# Patient Record
Sex: Female | Born: 1940
Health system: Southern US, Community
[De-identification: ages and names within clinical notes are randomized; demographics above are authoritative.]

## PROBLEM LIST (undated history)

## (undated) ENCOUNTER — Emergency Department (HOSPITAL_COMMUNITY): Admission: EM | Payer: Medicare Other | Source: Home / Self Care

## (undated) DIAGNOSIS — Z87898 Personal history of other specified conditions: Secondary | ICD-10-CM

## (undated) DIAGNOSIS — K2270 Barrett's esophagus without dysplasia: Secondary | ICD-10-CM

## (undated) DIAGNOSIS — G459 Transient cerebral ischemic attack, unspecified: Secondary | ICD-10-CM

## (undated) DIAGNOSIS — J019 Acute sinusitis, unspecified: Secondary | ICD-10-CM

## (undated) DIAGNOSIS — I739 Peripheral vascular disease, unspecified: Secondary | ICD-10-CM

## (undated) DIAGNOSIS — M79609 Pain in unspecified limb: Secondary | ICD-10-CM

## (undated) DIAGNOSIS — E041 Nontoxic single thyroid nodule: Principal | ICD-10-CM

## (undated) DIAGNOSIS — I639 Cerebral infarction, unspecified: Secondary | ICD-10-CM

## (undated) DIAGNOSIS — M5137 Other intervertebral disc degeneration, lumbosacral region: Secondary | ICD-10-CM

## (undated) DIAGNOSIS — E785 Hyperlipidemia, unspecified: Secondary | ICD-10-CM

## (undated) DIAGNOSIS — F411 Generalized anxiety disorder: Secondary | ICD-10-CM

## (undated) DIAGNOSIS — Z0000 Encounter for general adult medical examination without abnormal findings: Secondary | ICD-10-CM

## (undated) DIAGNOSIS — N35919 Unspecified urethral stricture, male, unspecified site: Secondary | ICD-10-CM

## (undated) DIAGNOSIS — M25539 Pain in unspecified wrist: Secondary | ICD-10-CM

## (undated) DIAGNOSIS — K5909 Other constipation: Secondary | ICD-10-CM

## (undated) DIAGNOSIS — K219 Gastro-esophageal reflux disease without esophagitis: Secondary | ICD-10-CM

## (undated) DIAGNOSIS — G609 Hereditary and idiopathic neuropathy, unspecified: Secondary | ICD-10-CM

## (undated) DIAGNOSIS — Z8679 Personal history of other diseases of the circulatory system: Secondary | ICD-10-CM

## (undated) DIAGNOSIS — F329 Major depressive disorder, single episode, unspecified: Secondary | ICD-10-CM

## (undated) DIAGNOSIS — M5030 Other cervical disc degeneration, unspecified cervical region: Secondary | ICD-10-CM

## (undated) DIAGNOSIS — I6529 Occlusion and stenosis of unspecified carotid artery: Secondary | ICD-10-CM

## (undated) DIAGNOSIS — Z86010 Personal history of colonic polyps: Secondary | ICD-10-CM

## (undated) DIAGNOSIS — M545 Low back pain: Secondary | ICD-10-CM

## (undated) DIAGNOSIS — H6980 Other specified disorders of Eustachian tube, unspecified ear: Secondary | ICD-10-CM

## (undated) DIAGNOSIS — R7302 Impaired glucose tolerance (oral): Secondary | ICD-10-CM

## (undated) HISTORY — DX: Impaired glucose tolerance (oral): R73.02

## (undated) HISTORY — DX: Other intervertebral disc degeneration, lumbosacral region: M51.37

## (undated) HISTORY — DX: Pain in unspecified limb: M79.609

## (undated) HISTORY — DX: Barrett's esophagus without dysplasia: K22.70

## (undated) HISTORY — DX: Pain in unspecified wrist: M25.539

## (undated) HISTORY — DX: Generalized anxiety disorder: F41.1

## (undated) HISTORY — DX: Acute sinusitis, unspecified: J01.90

## (undated) HISTORY — DX: Encounter for general adult medical examination without abnormal findings: Z00.00

## (undated) HISTORY — DX: Other cervical disc degeneration, unspecified cervical region: M50.30

## (undated) HISTORY — DX: Gastro-esophageal reflux disease without esophagitis: K21.9

## (undated) HISTORY — PX: OTHER SURGICAL HISTORY: SHX169

## (undated) HISTORY — DX: Personal history of colonic polyps: Z86.010

## (undated) HISTORY — DX: Cerebral infarction, unspecified: I63.9

## (undated) HISTORY — DX: Hyperlipidemia, unspecified: E78.5

## (undated) HISTORY — DX: Nontoxic single thyroid nodule: E04.1

## (undated) HISTORY — DX: Personal history of other specified conditions: Z87.898

## (undated) HISTORY — DX: Unspecified urethral stricture, male, unspecified site: N35.919

## (undated) HISTORY — DX: Hereditary and idiopathic neuropathy, unspecified: G60.9

## (undated) HISTORY — PX: TUBAL LIGATION: SHX77

## (undated) HISTORY — DX: Occlusion and stenosis of unspecified carotid artery: I65.29

## (undated) HISTORY — DX: Other specified disorders of Eustachian tube, unspecified ear: H69.80

## (undated) HISTORY — DX: Other constipation: K59.09

## (undated) HISTORY — DX: Transient cerebral ischemic attack, unspecified: G45.9

## (undated) HISTORY — DX: Peripheral vascular disease, unspecified: I73.9

## (undated) HISTORY — DX: Major depressive disorder, single episode, unspecified: F32.9

## (undated) HISTORY — DX: Personal history of other diseases of the circulatory system: Z86.79

## (undated) HISTORY — DX: Low back pain: M54.5

---

## 1982-07-30 HISTORY — PX: OTHER SURGICAL HISTORY: SHX169

## 2000-05-18 ENCOUNTER — Emergency Department (HOSPITAL_COMMUNITY): Admission: EM | Admit: 2000-05-18 | Discharge: 2000-05-18 | Payer: Self-pay | Admitting: Emergency Medicine

## 2000-07-12 ENCOUNTER — Encounter: Admission: RE | Admit: 2000-07-12 | Discharge: 2000-07-12 | Payer: Self-pay | Admitting: Family Medicine

## 2000-07-12 ENCOUNTER — Encounter: Payer: Self-pay | Admitting: Family Medicine

## 2000-08-22 ENCOUNTER — Ambulatory Visit (HOSPITAL_COMMUNITY): Admission: RE | Admit: 2000-08-22 | Discharge: 2000-08-22 | Payer: Self-pay | Admitting: Urology

## 2000-08-22 ENCOUNTER — Encounter (INDEPENDENT_AMBULATORY_CARE_PROVIDER_SITE_OTHER): Payer: Self-pay | Admitting: Specialist

## 2000-12-11 ENCOUNTER — Encounter: Admission: RE | Admit: 2000-12-11 | Discharge: 2000-12-11 | Payer: Self-pay | Admitting: Family Medicine

## 2000-12-11 ENCOUNTER — Encounter: Payer: Self-pay | Admitting: Family Medicine

## 2001-04-04 ENCOUNTER — Other Ambulatory Visit: Admission: RE | Admit: 2001-04-04 | Discharge: 2001-04-04 | Payer: Self-pay | Admitting: Family Medicine

## 2001-05-11 ENCOUNTER — Emergency Department (HOSPITAL_COMMUNITY): Admission: EM | Admit: 2001-05-11 | Discharge: 2001-05-11 | Payer: Self-pay | Admitting: Emergency Medicine

## 2001-05-11 ENCOUNTER — Encounter: Payer: Self-pay | Admitting: Emergency Medicine

## 2001-07-15 ENCOUNTER — Encounter: Admission: RE | Admit: 2001-07-15 | Discharge: 2001-07-15 | Payer: Self-pay | Admitting: Family Medicine

## 2001-07-15 ENCOUNTER — Encounter: Payer: Self-pay | Admitting: Family Medicine

## 2001-10-07 ENCOUNTER — Ambulatory Visit (HOSPITAL_COMMUNITY): Admission: RE | Admit: 2001-10-07 | Discharge: 2001-10-07 | Payer: Self-pay | Admitting: Gastroenterology

## 2001-11-29 ENCOUNTER — Emergency Department (HOSPITAL_COMMUNITY): Admission: EM | Admit: 2001-11-29 | Discharge: 2001-11-30 | Payer: Self-pay | Admitting: Emergency Medicine

## 2001-11-30 ENCOUNTER — Encounter: Payer: Self-pay | Admitting: Emergency Medicine

## 2002-07-20 ENCOUNTER — Encounter: Admission: RE | Admit: 2002-07-20 | Discharge: 2002-07-20 | Payer: Self-pay | Admitting: Family Medicine

## 2002-07-20 ENCOUNTER — Encounter: Payer: Self-pay | Admitting: Family Medicine

## 2003-04-27 ENCOUNTER — Encounter: Payer: Self-pay | Admitting: Emergency Medicine

## 2003-04-28 ENCOUNTER — Observation Stay (HOSPITAL_COMMUNITY): Admission: EM | Admit: 2003-04-28 | Discharge: 2003-04-29 | Payer: Self-pay | Admitting: Emergency Medicine

## 2003-07-22 ENCOUNTER — Encounter: Admission: RE | Admit: 2003-07-22 | Discharge: 2003-07-22 | Payer: Self-pay | Admitting: Family Medicine

## 2003-11-16 ENCOUNTER — Encounter: Admission: RE | Admit: 2003-11-16 | Discharge: 2003-11-16 | Payer: Self-pay | Admitting: Family Medicine

## 2004-02-21 ENCOUNTER — Encounter: Admission: RE | Admit: 2004-02-21 | Discharge: 2004-02-21 | Payer: Self-pay | Admitting: Otolaryngology

## 2004-06-08 ENCOUNTER — Encounter (INDEPENDENT_AMBULATORY_CARE_PROVIDER_SITE_OTHER): Payer: Self-pay | Admitting: Specialist

## 2004-06-08 ENCOUNTER — Ambulatory Visit (HOSPITAL_COMMUNITY): Admission: RE | Admit: 2004-06-08 | Discharge: 2004-06-08 | Payer: Self-pay | Admitting: Gastroenterology

## 2004-07-17 ENCOUNTER — Other Ambulatory Visit: Admission: RE | Admit: 2004-07-17 | Discharge: 2004-07-17 | Payer: Self-pay | Admitting: Obstetrics and Gynecology

## 2004-08-26 ENCOUNTER — Emergency Department (HOSPITAL_COMMUNITY): Admission: EM | Admit: 2004-08-26 | Discharge: 2004-08-26 | Payer: Self-pay | Admitting: Emergency Medicine

## 2004-09-28 ENCOUNTER — Encounter: Admission: RE | Admit: 2004-09-28 | Discharge: 2004-09-28 | Payer: Self-pay | Admitting: Family Medicine

## 2004-12-06 ENCOUNTER — Ambulatory Visit (HOSPITAL_COMMUNITY): Admission: RE | Admit: 2004-12-06 | Discharge: 2004-12-06 | Payer: Self-pay | Admitting: Orthopedic Surgery

## 2005-01-05 ENCOUNTER — Emergency Department (HOSPITAL_COMMUNITY): Admission: EM | Admit: 2005-01-05 | Discharge: 2005-01-06 | Payer: Self-pay | Admitting: Emergency Medicine

## 2005-03-26 ENCOUNTER — Emergency Department (HOSPITAL_COMMUNITY): Admission: EM | Admit: 2005-03-26 | Discharge: 2005-03-26 | Payer: Self-pay | Admitting: Family Medicine

## 2005-05-11 ENCOUNTER — Encounter: Payer: Self-pay | Admitting: Internal Medicine

## 2005-05-11 ENCOUNTER — Encounter: Admission: RE | Admit: 2005-05-11 | Discharge: 2005-05-11 | Payer: Self-pay | Admitting: Internal Medicine

## 2005-08-21 ENCOUNTER — Encounter: Admission: RE | Admit: 2005-08-21 | Discharge: 2005-08-21 | Payer: Self-pay | Admitting: Neurology

## 2005-10-12 ENCOUNTER — Encounter: Admission: RE | Admit: 2005-10-12 | Discharge: 2005-10-12 | Payer: Self-pay | Admitting: Family Medicine

## 2005-11-16 ENCOUNTER — Encounter: Payer: Self-pay | Admitting: Internal Medicine

## 2006-05-29 ENCOUNTER — Emergency Department (HOSPITAL_COMMUNITY): Admission: EM | Admit: 2006-05-29 | Discharge: 2006-05-29 | Payer: Self-pay | Admitting: *Deleted

## 2006-06-03 ENCOUNTER — Observation Stay (HOSPITAL_COMMUNITY): Admission: EM | Admit: 2006-06-03 | Discharge: 2006-06-03 | Payer: Self-pay | Admitting: Family Medicine

## 2006-06-11 ENCOUNTER — Ambulatory Visit (HOSPITAL_COMMUNITY): Admission: RE | Admit: 2006-06-11 | Discharge: 2006-06-11 | Payer: Self-pay | Admitting: Cardiology

## 2006-10-15 ENCOUNTER — Encounter: Admission: RE | Admit: 2006-10-15 | Discharge: 2006-10-15 | Payer: Self-pay | Admitting: Family Medicine

## 2007-01-24 ENCOUNTER — Ambulatory Visit: Payer: Self-pay | Admitting: *Deleted

## 2007-01-24 ENCOUNTER — Emergency Department (HOSPITAL_COMMUNITY): Admission: EM | Admit: 2007-01-24 | Discharge: 2007-01-24 | Payer: Self-pay | Admitting: Emergency Medicine

## 2007-03-04 ENCOUNTER — Ambulatory Visit: Payer: Self-pay | Admitting: Vascular Surgery

## 2007-03-04 ENCOUNTER — Inpatient Hospital Stay (HOSPITAL_COMMUNITY): Admission: EM | Admit: 2007-03-04 | Discharge: 2007-03-07 | Payer: Self-pay | Admitting: Emergency Medicine

## 2007-03-05 ENCOUNTER — Encounter (INDEPENDENT_AMBULATORY_CARE_PROVIDER_SITE_OTHER): Payer: Self-pay | Admitting: Internal Medicine

## 2007-06-30 ENCOUNTER — Encounter: Payer: Self-pay | Admitting: Internal Medicine

## 2007-06-30 LAB — CONVERTED CEMR LAB: Pap Smear: NORMAL

## 2007-07-04 ENCOUNTER — Other Ambulatory Visit: Admission: RE | Admit: 2007-07-04 | Discharge: 2007-07-04 | Payer: Self-pay | Admitting: Family Medicine

## 2007-07-08 ENCOUNTER — Encounter: Payer: Self-pay | Admitting: Internal Medicine

## 2007-08-05 ENCOUNTER — Emergency Department (HOSPITAL_COMMUNITY): Admission: EM | Admit: 2007-08-05 | Discharge: 2007-08-06 | Payer: Self-pay | Admitting: Emergency Medicine

## 2007-08-29 ENCOUNTER — Encounter: Payer: Self-pay | Admitting: Internal Medicine

## 2007-10-16 ENCOUNTER — Encounter: Admission: RE | Admit: 2007-10-16 | Discharge: 2007-10-16 | Payer: Self-pay | Admitting: Family Medicine

## 2008-02-13 ENCOUNTER — Ambulatory Visit (HOSPITAL_COMMUNITY): Admission: RE | Admit: 2008-02-13 | Discharge: 2008-02-13 | Payer: Self-pay | Admitting: Neurology

## 2008-07-08 ENCOUNTER — Encounter: Payer: Self-pay | Admitting: Internal Medicine

## 2008-10-26 ENCOUNTER — Encounter: Payer: Self-pay | Admitting: Internal Medicine

## 2009-01-06 ENCOUNTER — Encounter: Payer: Self-pay | Admitting: Internal Medicine

## 2009-01-27 ENCOUNTER — Encounter: Admission: RE | Admit: 2009-01-27 | Discharge: 2009-01-27 | Payer: Self-pay | Admitting: Family Medicine

## 2009-04-08 ENCOUNTER — Encounter: Payer: Self-pay | Admitting: Internal Medicine

## 2009-07-04 ENCOUNTER — Encounter: Payer: Self-pay | Admitting: Internal Medicine

## 2009-08-29 ENCOUNTER — Ambulatory Visit: Payer: Self-pay | Admitting: Internal Medicine

## 2009-08-29 ENCOUNTER — Telehealth (INDEPENDENT_AMBULATORY_CARE_PROVIDER_SITE_OTHER): Payer: Self-pay | Admitting: *Deleted

## 2009-08-29 DIAGNOSIS — E739 Lactose intolerance, unspecified: Secondary | ICD-10-CM | POA: Insufficient documentation

## 2009-08-29 DIAGNOSIS — M51379 Other intervertebral disc degeneration, lumbosacral region without mention of lumbar back pain or lower extremity pain: Secondary | ICD-10-CM

## 2009-08-29 DIAGNOSIS — M5137 Other intervertebral disc degeneration, lumbosacral region: Secondary | ICD-10-CM

## 2009-08-29 DIAGNOSIS — E785 Hyperlipidemia, unspecified: Secondary | ICD-10-CM

## 2009-08-29 DIAGNOSIS — M5030 Other cervical disc degeneration, unspecified cervical region: Secondary | ICD-10-CM

## 2009-08-29 DIAGNOSIS — I739 Peripheral vascular disease, unspecified: Secondary | ICD-10-CM

## 2009-08-29 DIAGNOSIS — Z860100 Personal history of colon polyps, unspecified: Secondary | ICD-10-CM

## 2009-08-29 DIAGNOSIS — Z8679 Personal history of other diseases of the circulatory system: Secondary | ICD-10-CM

## 2009-08-29 DIAGNOSIS — M79609 Pain in unspecified limb: Secondary | ICD-10-CM

## 2009-08-29 DIAGNOSIS — K219 Gastro-esophageal reflux disease without esophagitis: Secondary | ICD-10-CM

## 2009-08-29 HISTORY — DX: Hyperlipidemia, unspecified: E78.5

## 2009-08-29 HISTORY — DX: Other cervical disc degeneration, unspecified cervical region: M50.30

## 2009-08-29 HISTORY — DX: Personal history of colon polyps, unspecified: Z86.0100

## 2009-08-29 HISTORY — DX: Other intervertebral disc degeneration, lumbosacral region: M51.37

## 2009-08-29 HISTORY — DX: Peripheral vascular disease, unspecified: I73.9

## 2009-08-29 HISTORY — DX: Other intervertebral disc degeneration, lumbosacral region without mention of lumbar back pain or lower extremity pain: M51.379

## 2009-08-29 HISTORY — DX: Pain in unspecified limb: M79.609

## 2009-08-29 HISTORY — DX: Personal history of colonic polyps: Z86.010

## 2009-08-29 HISTORY — DX: Personal history of other diseases of the circulatory system: Z86.79

## 2009-08-29 HISTORY — DX: Gastro-esophageal reflux disease without esophagitis: K21.9

## 2009-08-30 ENCOUNTER — Telehealth (INDEPENDENT_AMBULATORY_CARE_PROVIDER_SITE_OTHER): Payer: Self-pay | Admitting: *Deleted

## 2009-08-31 ENCOUNTER — Encounter: Payer: Self-pay | Admitting: Internal Medicine

## 2009-08-31 DIAGNOSIS — I6529 Occlusion and stenosis of unspecified carotid artery: Secondary | ICD-10-CM

## 2009-08-31 DIAGNOSIS — I739 Peripheral vascular disease, unspecified: Secondary | ICD-10-CM

## 2009-08-31 HISTORY — DX: Occlusion and stenosis of unspecified carotid artery: I65.29

## 2009-09-01 ENCOUNTER — Encounter: Payer: Self-pay | Admitting: Internal Medicine

## 2009-09-01 ENCOUNTER — Ambulatory Visit: Payer: Self-pay

## 2009-09-22 ENCOUNTER — Telehealth (INDEPENDENT_AMBULATORY_CARE_PROVIDER_SITE_OTHER): Payer: Self-pay | Admitting: *Deleted

## 2009-10-03 ENCOUNTER — Encounter: Payer: Self-pay | Admitting: Internal Medicine

## 2009-10-03 DIAGNOSIS — F329 Major depressive disorder, single episode, unspecified: Secondary | ICD-10-CM

## 2009-10-03 DIAGNOSIS — K2270 Barrett's esophagus without dysplasia: Secondary | ICD-10-CM

## 2009-10-03 DIAGNOSIS — K5909 Other constipation: Secondary | ICD-10-CM

## 2009-10-03 DIAGNOSIS — N35919 Unspecified urethral stricture, male, unspecified site: Secondary | ICD-10-CM

## 2009-10-03 DIAGNOSIS — G609 Hereditary and idiopathic neuropathy, unspecified: Secondary | ICD-10-CM

## 2009-10-03 DIAGNOSIS — F3289 Other specified depressive episodes: Secondary | ICD-10-CM

## 2009-10-03 DIAGNOSIS — F411 Generalized anxiety disorder: Secondary | ICD-10-CM

## 2009-10-03 DIAGNOSIS — Z87898 Personal history of other specified conditions: Secondary | ICD-10-CM | POA: Insufficient documentation

## 2009-10-03 HISTORY — DX: Other specified depressive episodes: F32.89

## 2009-10-03 HISTORY — DX: Major depressive disorder, single episode, unspecified: F32.9

## 2009-10-03 HISTORY — DX: Personal history of other specified conditions: Z87.898

## 2009-10-03 HISTORY — DX: Generalized anxiety disorder: F41.1

## 2009-10-03 HISTORY — DX: Unspecified urethral stricture, male, unspecified site: N35.919

## 2009-10-03 HISTORY — DX: Other constipation: K59.09

## 2009-10-03 HISTORY — DX: Hereditary and idiopathic neuropathy, unspecified: G60.9

## 2009-10-03 HISTORY — DX: Barrett's esophagus without dysplasia: K22.70

## 2009-11-01 ENCOUNTER — Encounter (INDEPENDENT_AMBULATORY_CARE_PROVIDER_SITE_OTHER): Payer: Self-pay | Admitting: Internal Medicine

## 2009-11-01 ENCOUNTER — Inpatient Hospital Stay (HOSPITAL_COMMUNITY): Admission: EM | Admit: 2009-11-01 | Discharge: 2009-11-02 | Payer: Self-pay | Admitting: Emergency Medicine

## 2009-11-01 ENCOUNTER — Ambulatory Visit: Payer: Self-pay | Admitting: Vascular Surgery

## 2009-11-22 ENCOUNTER — Ambulatory Visit: Payer: Self-pay | Admitting: Internal Medicine

## 2009-11-22 DIAGNOSIS — M545 Low back pain, unspecified: Secondary | ICD-10-CM

## 2009-11-22 DIAGNOSIS — G459 Transient cerebral ischemic attack, unspecified: Secondary | ICD-10-CM | POA: Insufficient documentation

## 2009-11-22 HISTORY — DX: Low back pain, unspecified: M54.50

## 2009-11-22 HISTORY — DX: Transient cerebral ischemic attack, unspecified: G45.9

## 2009-12-05 ENCOUNTER — Telehealth: Payer: Self-pay | Admitting: Internal Medicine

## 2010-02-08 ENCOUNTER — Telehealth: Payer: Self-pay | Admitting: Internal Medicine

## 2010-03-24 ENCOUNTER — Encounter: Admission: RE | Admit: 2010-03-24 | Discharge: 2010-03-24 | Payer: Self-pay | Admitting: Internal Medicine

## 2010-03-24 LAB — HM MAMMOGRAPHY: HM Mammogram: NEGATIVE

## 2010-03-27 ENCOUNTER — Encounter: Payer: Self-pay | Admitting: Internal Medicine

## 2010-04-06 ENCOUNTER — Encounter: Admission: RE | Admit: 2010-04-06 | Discharge: 2010-04-06 | Payer: Self-pay | Admitting: Gastroenterology

## 2010-04-20 ENCOUNTER — Encounter: Payer: Self-pay | Admitting: Internal Medicine

## 2010-05-22 ENCOUNTER — Encounter: Payer: Self-pay | Admitting: Internal Medicine

## 2010-05-25 ENCOUNTER — Ambulatory Visit: Payer: Self-pay | Admitting: Internal Medicine

## 2010-05-25 DIAGNOSIS — M25539 Pain in unspecified wrist: Secondary | ICD-10-CM

## 2010-05-25 HISTORY — DX: Pain in unspecified wrist: M25.539

## 2010-05-25 LAB — CONVERTED CEMR LAB
ALT: 22 units/L (ref 0–35)
AST: 30 units/L (ref 0–37)
Alkaline Phosphatase: 63 units/L (ref 39–117)
Basophils Relative: 0.9 % (ref 0.0–3.0)
Bilirubin Urine: NEGATIVE
Bilirubin, Direct: 0.1 mg/dL (ref 0.0–0.3)
Calcium: 9.3 mg/dL (ref 8.4–10.5)
Creatinine, Ser: 0.8 mg/dL (ref 0.4–1.2)
Eosinophils Absolute: 0.1 10*3/uL (ref 0.0–0.7)
Eosinophils Relative: 3.2 % (ref 0.0–5.0)
GFR calc non Af Amer: 88.41 mL/min (ref >60)
HDL: 46.1 mg/dL (ref >39.00)
Hemoglobin: 14.9 g/dL (ref 12.0–15.0)
Hgb A1c MFr Bld: 6.3 % (ref 4.6–6.5)
Leukocytes, UA: NEGATIVE
Lymphocytes Relative: 43.4 % (ref 12.0–46.0)
Monocytes Relative: 9.2 % (ref 3.0–12.0)
Neutro Abs: 1.6 10*3/uL (ref 1.4–7.7)
Neutrophils Relative %: 43.3 % (ref 43.0–77.0)
Nitrite: NEGATIVE
RBC: 4.6 M/uL (ref 3.87–5.11)
Sodium: 136 meq/L (ref 135–145)
TSH: 0.51 microintl units/mL (ref 0.35–5.50)
Total Protein: 6.7 g/dL (ref 6.0–8.3)
WBC: 3.6 10*3/uL — ABNORMAL LOW (ref 4.5–10.5)
pH: 7.5 (ref 5.0–8.0)

## 2010-06-26 ENCOUNTER — Ambulatory Visit: Payer: Self-pay | Admitting: Internal Medicine

## 2010-06-28 ENCOUNTER — Telehealth (INDEPENDENT_AMBULATORY_CARE_PROVIDER_SITE_OTHER): Payer: Self-pay | Admitting: *Deleted

## 2010-07-03 ENCOUNTER — Telehealth (INDEPENDENT_AMBULATORY_CARE_PROVIDER_SITE_OTHER): Payer: Self-pay | Admitting: *Deleted

## 2010-07-06 ENCOUNTER — Encounter: Payer: Self-pay | Admitting: Internal Medicine

## 2010-07-19 ENCOUNTER — Telehealth (INDEPENDENT_AMBULATORY_CARE_PROVIDER_SITE_OTHER): Payer: Self-pay | Admitting: *Deleted

## 2010-08-02 ENCOUNTER — Encounter: Payer: Self-pay | Admitting: Internal Medicine

## 2010-08-08 ENCOUNTER — Ambulatory Visit
Admission: RE | Admit: 2010-08-08 | Discharge: 2010-08-08 | Payer: Self-pay | Source: Home / Self Care | Attending: Internal Medicine | Admitting: Internal Medicine

## 2010-08-10 ENCOUNTER — Ambulatory Visit
Admission: RE | Admit: 2010-08-10 | Discharge: 2010-08-10 | Payer: Self-pay | Source: Home / Self Care | Attending: Internal Medicine | Admitting: Internal Medicine

## 2010-08-21 ENCOUNTER — Encounter: Payer: Self-pay | Admitting: Neurology

## 2010-08-29 NOTE — Progress Notes (Signed)
Summary: Rx refill req  Phone Note Refill Request Message from:  Patient on February 08, 2010 3:17 PM  Refills Requested: Medication #1:  CRESTOR 20 MG TABS 1 by mouth once daily   Dosage confirmed as above?Dosage Confirmed   Supply Requested: 6 months  Medication #2:  OMEPRAZOLE 20 MG CPDR 2po once daily   Dosage confirmed as above?Dosage Confirmed   Supply Requested: 6 months  Medication #3:  PLAVIX 75 MG TABS 1po once daily.   Dosage confirmed as above?Dosage Confirmed   Supply Requested: 6 months Pt request Rx to [facility] [address]   Method Requested: Electronic Initial call taken by: Margaret Pyle, CMA,  February 08, 2010 3:17 PM    Prescriptions: PLAVIX 75 MG TABS (CLOPIDOGREL BISULFATE) 1po once daily  #90 x 1   Entered by:   Margaret Pyle, CMA   Authorized by:   Corwin Levins MD   Signed by:   Margaret Pyle, CMA on 02/08/2010   Method used:   Electronically to        [facility] [address] (retail)       [phone]       [place], [place]  [address]       Ph: [phone]       Fax: [phone]   RxID:   [id number] OMEPRAZOLE 20 MG CPDR (OMEPRAZOLE) 2po once daily  #180 x 1   Entered by:   Margaret Pyle, CMA   Authorized by:   Corwin Levins MD   Signed by:   Margaret Pyle, CMA on 02/08/2010   Method used:   Electronically to        [facility] [address] (retail)       [phone]       [place], [place]  [address]       Ph: [phone]       Fax: [phone]   RxID:   [id number] CRESTOR 20 MG TABS (ROSUVASTATIN CALCIUM) 1 by mouth once daily  #90 x 1   Entered by:   Margaret Pyle, CMA   Authorized by:   Corwin Levins MD   Signed by:   Margaret Pyle, CMA on 02/08/2010   Method used:   Electronically to        [facility] [address] (retail)       [phone]       [place], [place]   [address]       Ph: [phone]       Fax: [phone]   RxID:   [id number]

## 2010-08-29 NOTE — Letter (Signed)
Summary: Date Range:05-28-05 to 07-04-09/[facility]  Date Range:05-28-05 to 07-04-09/[facility]   Imported By: Sherian Rein 10/06/2009 07:32:44  _____________________________________________________________________  External Attachment:    Type:   Image     Comment:   External Document

## 2010-08-29 NOTE — Letter (Signed)
Summary: [facility]  [facility]   Imported By: Lester Springdale 04/25/2010 09:32:20  _____________________________________________________________________  External Attachment:    Type:   Image     Comment:   External Document

## 2010-08-29 NOTE — Letter (Signed)
Summary: [facility]   Imported By: Sherian Rein 04/05/2010 15:26:19  _____________________________________________________________________  External Attachment:    Type:   Image     Comment:   External Document

## 2010-08-29 NOTE — Progress Notes (Signed)
----   Converted from flag ---- ---- 08/29/2009 12:59 PM, Edman Circle wrote: appt 2/3 @ 4:00  ---- 08/29/2009 10:35 AM, Dagoberto Reef wrote: Thanks  ---- 08/29/2009 10:32 AM, Corwin Levins MD wrote: The following orders have been entered for this patient and placed on Admin Hold:  Type:     Referral       Code:   Misc. Ref Description:   Misc. Referral Order Date:   08/29/2009   Authorized By:   Corwin Levins MD Order #:   [id number] Clinical Notes:   Type of Referral:  carotid dopplers Reason: ------------------------------

## 2010-08-29 NOTE — Progress Notes (Signed)
Summary: Medical Release completed  Medical release completed and mailed to [facility] [address] [name] [place], [place] [address] Attn: Dr. Abigail Miyamoto.[name]  August 29, 2009 3:52 PM  Appended Document: Medical Release completed Records received from [facility] for Dr.John 161 pages. Placed on dumbwaiter. [name] made aware.

## 2010-08-29 NOTE — Assessment & Plan Note (Signed)
Summary: [facility] hospital [facility]/#/cd   Vital Signs:  Patient profile:   70 year old female Height:      66.5 inches Weight:      181 pounds BMI:     28.88 O2 Sat:      97 % on Room air Temp:     97.5 degrees F oral Pulse rate:   72 / minute BP sitting:   118 / 62  (left arm) Cuff size:   regular  Vitals Entered ByZella Ball Ewing (November 22, 2009 2:35 PM)  O2 Flow:  Room air CC: Post Hospital/RE   CC:  Post Hospital/RE.  History of Present Illness: overall doing well, post hospn for TIA, fortunately resolved without residual or recurrence of neuro symtpoms - Pt denies new neuro symptoms such as headache, facial or extremity weakness .   Good compliance with meds, good tolerability, including lipids med.  Does find her current PPI very expensive and wishes to change to less expensive.  Pt denies CP, sob, doe, wheezing, orthopnea, pnd, worsening LE edema, palps, dizziness or syncope   Also with ongoing chronic leg and back pain and asks for second opinion with different orthopedic.  No falls, fever, night sweats, wt loss or other constitutional symtpoms.    Problems Prior to Update: 1)  Constipation, Chronic  (ICD-564.09) 2)  Barretts Esophagus  (ICD-530.85) 3)  Urethral Stricture  (ICD-598.9) 4)  Depression  (ICD-311) 5)  Positive Ppd  (ICD-795.5) 6)  Peripheral Neuropathy  (ICD-356.9) 7)  Anxiety  (ICD-300.00) 8)  Vocal Cord Polyp, Hx of  (ICD-V13.8) 9)  Carotid Artery Disease  (ICD-433.10) 10)  Leg Pain, Bilateral  (ICD-729.5) 11)  Glucose Intolerance  (ICD-271.3) 12)  Colonic Polyps, Hx of  (ICD-V12.72) 13)  Disc Disease, Lumbar  (ICD-722.52) 14)  Disc Disease, Cervical  (ICD-722.4) 15)  Peripheral Vascular Disease  (ICD-443.9) 16)  Hyperlipidemia  (ICD-272.4) 17)  Gerd  (ICD-530.81) 18)  Transient Ischemic Attack, Hx of  (ICD-V12.50)  Medications Prior to Update: 1)  Crestor 20 Mg Tabs (Rosuvastatin Calcium) .Marland Kitchen.. 1 By Mouth Once Daily 2)  Aciphex 20 Mg Tbec (Rabeprazole  Sodium) .Marland Kitchen.. 1 By Mouth Once Daily 3)  Calcium 1500 Mg Tabs (Calcium Carbonate) .Marland Kitchen.. 1 By Mouth Once Daily 4)  Multivitamins  Tabs (Multiple Vitamin) .Marland Kitchen.. 1 By Mouth Once Daily 5)  Aspir-Low 81 Mg Tbec (Aspirin) .Marland Kitchen.. 1 By Mouth Once Daily 6)  Fish Oil 1000 Mg Caps (Omega-3 Fatty Acids) .Marland Kitchen.. 1 By Mouth Once Daily  Current Medications (verified): 1)  Crestor 20 Mg Tabs (Rosuvastatin Calcium) .Marland Kitchen.. 1 By Mouth Once Daily 2)  Omeprazole 20 Mg Cpdr (Omeprazole) .... 2po Once Daily 3)  Calcium 1500 Mg Tabs (Calcium Carbonate) .Marland Kitchen.. 1 By Mouth Once Daily 4)  Multivitamins  Tabs (Multiple Vitamin) .Marland Kitchen.. 1 By Mouth Once Daily 5)  Aspir-Low 81 Mg Tbec (Aspirin) .Marland Kitchen.. 1 By Mouth Once Daily 6)  Fish Oil 1000 Mg Caps (Omega-3 Fatty Acids) .Marland Kitchen.. 1 By Mouth Once Daily 7)  Plavix 75 Mg Tabs (Clopidogrel Bisulfate) .Marland Kitchen.. 1po Once Daily  Allergies (verified): 1)  ! Pcn 2)  Lipitor (Atorvastatin)  Past History:  Past Medical History: Last updated: 10/03/2009 DJD - hands ? Transient ischemic attack, hx of - right brain, 2008, vs migraine medical noncompliance hx GERD Hyperlipidemia Peripheral vascular disease - mild carotid 2008 cervical degenerative spondylosis/disc disease normal coronary arteries by  cath 2007 lumbar disc disease/chronic back pain Colonic polyps, hx of - dr Fayrene Fearing edwards/GI fatty  liver glycose intolerance Anxiety Peripheral neuropathy - severe, Dr Reynolds/Neuro PPD positive   June 2009 - referred to health Dept/ NEG there on repeat Depression hx of urethral stricture hx of Barretts Esophagus hx of RML pulm nodule 2004 CT  5 mm - Dr Shelle Iron chronic constipation  Past Surgical History: Last updated: 10/03/2009 s/p vocal cord polyps 1984/chronic hoarseness Tubal ligation stress test neg 2003, 2004, and 2006 hx of right ankle fracture  Social History: Last updated: 08/29/2009 Never Smoked Alcohol use-no Drug use-no 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   Head:  normocephalic and atraumatic.   Eyes:  vision grossly intact, pupils equal, and pupils round.   Ears:  R ear normal and L ear normal.   Nose:  no external deformity and no nasal discharge.   Mouth:  no gingival abnormalities and pharynx pink and moist.   Neck:  supple and no masses.   Lungs:  normal respiratory effort and normal breath sounds.   Heart:  normal rate and regular rhythm.   Msk:  no spine tenderness, no paravertebral tender Extremities:  no edema, no erythema  Neurologic:  cranial nerves II-XII intact, strength normal in all extremities, gait normal, and DTRs symmetrical and normal.     Impression & Recommendations:  Problem # 1:  TIA (ICD-435.9)  Her updated medication list for this problem includes:    Aspir-low 81 Mg Tbec (Aspirin) .Marland Kitchen... 1 by mouth once daily    Plavix 75 Mg Tabs (Clopidogrel bisulfate) .Marland Kitchen... 1po once daily stable overall by hx and exam, ok to continue meds/tx as is, Continue all previous medications as before this visit   Problem # 2:  HYPERLIPIDEMIA (ICD-272.4)  Her updated medication list for this problem includes:    Crestor 20 Mg Tabs (Rosuvastatin calcium) .Marland Kitchen... 1 by mouth once daily Continue all previous medications as before this visit , Pt to continue diet efforts, good med tolerance; to check labs next visit- goal LDL less than 70   Problem # 3:  GERD (ICD-530.81)  Her updated medication list for this problem includes:    Omeprazole 20 Mg Cpdr (Omeprazole) .Marland Kitchen... 2po once daily change to less expensive med ; f/u any worsening or breakthrough symptoms  Problem # 4:  LOW BACK PAIN, CHRONIC (ICD-724.2)  Her updated medication list for this problem includes:    Aspir-low 81 Mg Tbec (Aspirin) .Marland Kitchen... 1 by mouth once daily ok to refer to different  ortho per pt request  Complete Medication List: 1)  Crestor 20 Mg Tabs (Rosuvastatin calcium) .Marland Kitchen.. 1 by mouth once daily 2)  Omeprazole 20 Mg Cpdr (Omeprazole) .... 2po once daily 3)  Calcium 1500 Mg Tabs (Calcium carbonate) .Marland Kitchen.. 1 by mouth once daily 4)  Multivitamins Tabs (Multiple vitamin) .Marland Kitchen.. 1 by mouth once daily 5)  Aspir-low 81 Mg Tbec (Aspirin) .Marland Kitchen.. 1 by mouth once daily 6)  Fish Oil 1000 Mg Caps (Omega-3 fatty acids) .Marland Kitchen.. 1 by mouth once daily 7)  Plavix 75 Mg Tabs (Clopidogrel bisulfate) .Marland Kitchen.. 1po once daily  Patient Instructions: 1)  stop the aciphex when you are out 2)  start the omeprazole 20 mg -2 per day after that 3)  Continue all previous medications as before this visit  4)  Please schedule a follow-up appointment  in 6 months to repeat the carotid dopplers Prescriptions: OMEPRAZOLE 20 MG CPDR (OMEPRAZOLE) 2po once daily  #60 x 11   Entered and Authorized by:   Corwin Levins MD   Signed by:   Corwin Levins MD on 11/22/2009   Method used:   Print then Give to Patient   RxID:   [id number]

## 2010-08-29 NOTE — Progress Notes (Signed)
  Phone Note Other Incoming   Request: Send information Summary of Call: Received records request from [facility],  request forwarded to [facility].

## 2010-08-29 NOTE — Assessment & Plan Note (Signed)
Summary: 6 MO ROV /NWS   Vital Signs:  Patient profile:   70 year old female Height:      66.5 inches Weight:      175 pounds BMI:     27.92 O2 Sat:      99 % on Room air Temp:     98.6 degrees F oral Pulse rate:   75 / minute BP sitting:   92 / 70  (left arm) Cuff size:   regular  Vitals Entered By: Zella Ball Ewing CMA Duncan Dull) (May 25, 2010 11:02 AM)  O2 Flow:  Room air  Preventive Care Screening  Mammogram:    Next Due:  03/2011  CC: 6 month ROV/RE   CC:  6 month ROV/RE.  History of Present Illness: here to f/u;  did not like the bruising and diffuse leg aching, and lumpiness to the flesh of the inner thighs  on the plavix so went to asa 325 mg for several months and seemed the bruising and the leg aching adn lumpiness resolved;  has never tried aggrenox but is willing to try; Pt denies CP, worsening sob, doe, wheezing, orthopnea, pnd, worsening LE edema, palps, dizziness or syncope  Pt denies new neuro symptoms such as headache, facial or extremity weakness  Pt denies polydipsia, polyuria.  Overall good compliance with meds, trying to follow low chol DM diet, wt stable, little excercise however.  Pt states good ability with ADL's, low fall risk, home safety reviewed and adequate, no significant change in hearing or vision, trying to follow lower chol diet, and occasionally active only with regular excercise. Not taking the crestor partly due to cost, just not convinced from her most recent hospn this was important, though she did have left ICA wtih 40-59% stenosis.  No further TIA symptoms.     Also has bilat wrist pain, mild warmth on the right, that frustrates her and is ongoing.  Has mild ongoing depressive symptoms and anxiety but is not sure about treatment with "more pills" at this time.   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   Problems Prior to Update: 1)  Preventive Health Care  (ICD-V70.0) 2)  Low Back Pain, Chronic   (ICD-724.2) 3)  Tia  (ICD-435.9) 4)  Constipation, Chronic  (ICD-564.09) 5)  Barretts Esophagus  (ICD-530.85) 6)  Urethral Stricture  (ICD-598.9) 7)  Depression  (ICD-311) 8)  Positive Ppd  (ICD-795.5) 9)  Peripheral Neuropathy  (ICD-356.9) 10)  Anxiety  (ICD-300.00) 11)  Vocal Cord Polyp, Hx of  (ICD-V13.8) 12)  Carotid Artery Disease  (ICD-433.10) 13)  Leg Pain, Bilateral  (ICD-729.5) 14)  Glucose Intolerance  (ICD-271.3) 15)  Colonic Polyps, Hx of  (ICD-V12.72) 16)  Disc Disease, Lumbar  (ICD-722.52) 17)  Disc Disease, Cervical  (ICD-722.4) 18)  Peripheral Vascular Disease  (ICD-443.9) 19)  Hyperlipidemia  (ICD-272.4) 20)  Gerd  (ICD-530.81) 21)  Transient Ischemic Attack, Hx of  (ICD-V12.50)  Medications Prior to Update: 1)  Crestor 20 Mg Tabs (Rosuvastatin Calcium) .Marland Kitchen.. 1 By Mouth Once Daily 2)  Omeprazole 20 Mg Cpdr (Omeprazole) .... 2po Once Daily 3)  Calcium 1500 Mg Tabs (Calcium Carbonate) .Marland Kitchen.. 1 By Mouth Once Daily 4)  Multivitamins  Tabs (Multiple Vitamin) .Marland Kitchen.. 1 By Mouth Once Daily 5)  Aspir-Low 81 Mg Tbec (Aspirin) .Marland Kitchen.. 1 By Mouth Once Daily 6)  Fish Oil 1000 Mg Caps (Omega-3 Fatty Acids) .Marland Kitchen.. 1 By Mouth Once Daily 7)  Plavix 75 Mg Tabs (Clopidogrel Bisulfate) .Marland KitchenMarland KitchenMarland Kitchen  1po Once Daily  Current Medications (verified): 1)  Pravastatin Sodium 40 Mg Tabs (Pravastatin Sodium) .Marland Kitchen.. 1po Once Daily 2)  Omeprazole 20 Mg Cpdr (Omeprazole) .... 2po Once Daily 3)  Calcium 1500 Mg Tabs (Calcium Carbonate) .Marland Kitchen.. 1 By Mouth Once Daily 4)  Multivitamins  Tabs (Multiple Vitamin) .Marland Kitchen.. 1 By Mouth Once Daily 5)  Fish Oil 1000 Mg Caps (Omega-3 Fatty Acids) .Marland Kitchen.. 1 By Mouth Once Daily 6)  Aggrenox 25-200 Mg Xr12h-Cap (Aspirin-Dipyridamole) .Marland Kitchen.. 1po Two Times A Day 7)  Alprazolam 0.25 Mg Tabs (Alprazolam) .Marland Kitchen.. 1po Two Times A Day As Needed  Allergies (verified): 1)  ! Pcn 2)  Lipitor (Atorvastatin)  Past History:  Past Medical History: Last updated: 10/03/2009 DJD - hands ? Transient  ischemic attack, hx of - right brain, 2008, vs migraine medical noncompliance hx GERD Hyperlipidemia Peripheral vascular disease - mild carotid 2008 cervical degenerative spondylosis/disc disease normal coronary arteries by  cath 2007 lumbar disc disease/chronic back pain Colonic polyps, hx of - dr Fayrene Fearing edwards/GI fatty liver glycose intolerance Anxiety Peripheral neuropathy - severe, Dr Reynolds/Neuro PPD positive   June 2009 - referred to health Dept/ NEG there on repeat Depression hx of urethral stricture hx of Barretts Esophagus hx of RML pulm nodule 2004 CT  5 mm - Dr Shelle Iron chronic constipation  Past Surgical History: Last updated: 10/03/2009 s/p vocal cord polyps 1984/chronic hoarseness Tubal ligation stress test neg 2003, 2004, and 2006 hx of right ankle fracture  Family History: Last updated: 08/29/2009 mother with hx of stroke, heart disease, HTN, DM sister with multiple myeloma father died with TB 3 sibs deceased with lung cancer, childbirth, and pna  Social History: Last updated: 08/29/2009 Never Smoked Alcohol use-no 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         all otherwise negative per pt -    Physical Exam  General:  alert and overweight-appearing.   Head:  normocephalic and atraumatic.   Eyes:  vision grossly intact, pupils equal, and pupils round.   Ears:  R ear normal and L ear normal.   Nose:  no external deformity  and no nasal discharge.   Mouth:  no gingival abnormalities and pharynx pink and moist.   Neck:  supple and no masses.   Lungs:  normal respiratory effort and normal breath sounds.   Heart:  normal rate and regular rhythm.   Abdomen:  soft, non-tender, and normal bowel sounds.   Msk:  no spine tenderness, no paravertebral tender; bilat wrist tender and warmth without effusion Extremities:  no edema, no erythema  Neurologic:  cranial nerves II-XII intact, strength normal in all extremities, gait normal, and DTRs symmetrical and normal.   Skin:  color normal and no rashes.   Psych:  depressed affect and moderately anxious.     Impression & Recommendations:  Problem # 1:  Preventive Health Care (ICD-V70.0)  Overall doing well, age appropriate education and counseling updated, referral for preventive services and immunizations addressed, dietary counseling and smoking status adressed , most recent labs reviewed with pt, ecg  declined per pt   Orders: TLB-BMP (Basic Metabolic  Panel-BMET) (80048-METABOL) TLB-CBC Platelet - w/Differential (85025-CBCD) TLB-Hepatic/Liver Function Pnl (80076-HEPATIC) TLB-Lipid Panel (80061-LIPID) TLB-TSH (Thyroid Stimulating Hormone) (84443-TSH) TLB-Udip ONLY (81003-UDIP) I have personally reviewed and have noted 1.   The patient's medical and social history 2.   Their use of alcohol, tobacco or illicit drugs 3.   Their current medications and supplements 4.   The patient's functional ability including ADL's, fall risks, home safety risks and hearing or visual             impairment. 5.   Diet and physical activities 6.   Evidence for depression or mood disorders  The patients weight, height, BMI have been recorded in the chart I have made referrals, counseling and provided education to the patient based review of the above   Problem # 2:  TIA (ICD-435.9)  The following medications were removed from the medication list:    Aspir-low 81 Mg Tbec (Aspirin)  .Marland Kitchen... 1 by mouth once daily    Plavix 75 Mg Tabs (Clopidogrel bisulfate) .Marland Kitchen... 1po once daily Her updated medication list for this problem includes:    Aggrenox 25-200 Mg Xr12h-cap (Aspirin-dipyridamole) .Marland Kitchen... 1po two times a day treat as above, f/u any worsening signs or symptoms   Problem # 3:  HYPERLIPIDEMIA (ICD-272.4)  Her updated medication list for this problem includes:    Pravastatin Sodium 40 Mg Tabs (Pravastatin sodium) .Marland Kitchen... 1po once daily treat as above, f/u any worsening signs or symptoms , f/u labs 4 wks,Pt to continue diet efforts,  to check labs - goal LDL less than 70   Problem # 4:  CAROTID ARTERY DISEASE (ICD-433.10)  The following medications were removed from the medication list:    Aspir-low 81 Mg Tbec (Aspirin) .Marland Kitchen... 1 by mouth once daily    Plavix 75 Mg Tabs (Clopidogrel bisulfate) .Marland Kitchen... 1po once daily Her updated medication list for this problem includes:    Aggrenox 25-200 Mg Xr12h-cap (Aspirin-dipyridamole) .Marland Kitchen... 1po two times a day due for carotid dopplers f/u april 2012  Problem # 5:  WRIST PAIN, BILATERAL (ICD-719.43) c/w prob DJD - for tylenol arthritis 1 tid as needed   Problem # 6:  ANXIETY (ICD-300.00)  Her updated medication list for this problem includes:    Alprazolam 0.25 Mg Tabs (Alprazolam) .Marland Kitchen... 1po two times a day as needed treat as above, f/u any worsening signs or symptoms   Problem # 7:  DEPRESSION (ICD-311)  Her updated medication list for this problem includes:    Alprazolam 0.25 Mg Tabs (Alprazolam) .Marland Kitchen... 1po two times a day as needed ideally should try SSRI or similar but declines  Complete Medication List: 1)  Pravastatin Sodium 40 Mg Tabs (Pravastatin sodium) .Marland Kitchen.. 1po once daily 2)  Omeprazole 20 Mg Cpdr (Omeprazole) .... 2po once daily 3)  Calcium 1500 Mg Tabs (Calcium carbonate) .Marland Kitchen.. 1 by mouth once daily 4)  Multivitamins Tabs (Multiple vitamin) .Marland Kitchen.. 1 by mouth once daily 5)  Fish Oil 1000 Mg Caps (Omega-3 fatty acids)  .Marland Kitchen.. 1 by mouth once daily 6)  Aggrenox 25-200 Mg Xr12h-cap (Aspirin-dipyridamole) .Marland Kitchen.. 1po two times a day 7)  Alprazolam 0.25 Mg Tabs (Alprazolam) .Marland Kitchen.. 1po two times a day as needed  Other Orders: TLB-A1C / Hgb A1C (Glycohemoglobin) (83036-A1C) Future Orders: Admin 1st Vaccine (16109) ... 05/26/2010 DT Vaccine (60454) ... 05/26/2010  Patient Instructions: 1)  you can take tylenol arthritis 1 by mouth three times a day as needed  2)  stop the aspirin and plavix 3)  start the aggrenox  -  1 pill at night for 3 nights, then twice per day after that 4)  also start the pravachol 40 mg  5)  Please return in 4 wks for lab only: 6)  Lipids 272.0 7)  hepatic function panel:  v58.69 8)  Please take all new medications as prescribed  - the low dose xanax as needed  9)  Please go to the Lab in the basement for your blood and/or urine tests today 10)  Please call the number on the [facility] Card for results of your testing 11)  Please schedule a follow-up appointment in 1 year with CPX labs , or sooner if needed Prescriptions: ALPRAZOLAM 0.25 MG TABS (ALPRAZOLAM) 1po two times a day as needed  #60 x 1   Entered and Authorized by:   Corwin Levins MD   Signed by:   Corwin Levins MD on 05/25/2010   Method used:   Print then Give to Patient   RxID:   [id number] OMEPRAZOLE 20 MG CPDR (OMEPRAZOLE) 2po once daily  #180 x 3   Entered and Authorized by:   Corwin Levins MD   Signed by:   Corwin Levins MD on 05/25/2010   Method used:   Print then Give to Patient   RxID:   [id number] AGGRENOX 25-200 MG XR12H-CAP (ASPIRIN-DIPYRIDAMOLE) 1po two times a day  #60 x 11   Entered and Authorized by:   Corwin Levins MD   Signed by:   Corwin Levins MD on 05/25/2010   Method used:   Print then Give to Patient   RxID:   [id number] AGGRENOX 25-200 MG XR12H-CAP (ASPIRIN-DIPYRIDAMOLE) 1po two times a day  #180 x 3   Entered and Authorized by:   Corwin Levins MD   Signed by:   Corwin Levins MD on 05/25/2010    Method used:   Print then Give to Patient   RxID:   [id number] PRAVASTATIN SODIUM 40 MG TABS (PRAVASTATIN SODIUM) 1po once daily  #90 x 3   Entered and Authorized by:   Corwin Levins MD   Signed by:   Corwin Levins MD on 05/25/2010   Method used:   Print then Give to Patient   RxID:   [id number] PRAVASTATIN SODIUM 40 MG TABS (PRAVASTATIN SODIUM) 1po once daily  #90 x 3   Entered and Authorized by:   Corwin Levins MD   Signed by:   Corwin Levins MD on 05/25/2010   Method used:   Print then Give to Patient   RxID:   [id number] AGGRENOX 25-200 MG XR12H-CAP (ASPIRIN-DIPYRIDAMOLE) 1po two times a day  #60 x 11   Entered and Authorized by:   Corwin Levins MD   Signed by:   Corwin Levins MD on 05/25/2010   Method used:   Electronically to        [facility] [address] (retail)       [phone]       [place], [place]  [address]       Ph: [phone]       Fax: [phone]   RxID:   [id number]    Orders Added: 1)  TLB-BMP (Basic Metabolic Panel-BMET) [80048-METABOL] 2)  TLB-CBC Platelet - w/Differential [85025-CBCD] 3)  TLB-Hepatic/Liver Function Pnl [80076-HEPATIC] 4)  TLB-Lipid Panel [80061-LIPID] 5)  TLB-TSH (Thyroid Stimulating Hormone) [84443-TSH] 6)  TLB-Udip ONLY [81003-UDIP] 7)  TLB-A1C / Hgb A1C (Glycohemoglobin) [  83036-A1C] 8)  Est. Patient 65& > [99397] 9)  Admin 1st Vaccine [90471] 10)  DT Vaccine [60454]

## 2010-08-29 NOTE — Procedures (Signed)
Summary: Colonoscopy/[facility]  Colonoscopy/[facility]   Imported By: Sherian Rein 10/06/2009 07:41:30  _____________________________________________________________________  External Attachment:    Type:   Image     Comment:   External Document

## 2010-08-29 NOTE — Assessment & Plan Note (Signed)
Summary: new / [facility] / #/cd   Vital Signs:  Patient profile:   70 year old female Height:      66.5 inches Weight:      177 pounds BMI:     28.24 O2 Sat:      99 % on Room air Temp:     97.5 degrees F oral Pulse rate:   68 / minute BP sitting:   140 / 90  (left arm) Cuff size:   regular  Vitals Entered ByZella Ball Ewing (August 29, 2009 9:46 AM)  O2 Flow:  Room air  Preventive Care Screening  Last Flu Shot:    Date:  03/30/2009    Results:  Historical   Mammogram:    Date:  01/27/2009    Results:  normal   Colonoscopy:    Date:  07/30/2008    Results:  normal   Last Pneumovax:    Date:  07/31/2007    Results:  given   CC: New patient/RE   CC:  New patient/RE.  History of Present Illness: overall doing welll  tryign to follow lower  chol diet;  did see urge nt care recently with prod cough and had rash to PCN imrpoved with beneadryl, and PCN replaced with zpack;  had some right neck stiff adn sore yesterday with diffictuly turning the head to the right, but today improved,  still with cough from the chest she thinks but much less colored -  clear today,  and Pt denies CP, sob, doe, wheezing, orthopnea, pnd, worsening LE edema, palps, dizziness or syncope   Pt denies new neuro symptoms such as headache, facial or extremity weakness  Has some hoarseness on occaion like today, has not seene ENT for some time.    Preventive Screening-Counseling & Management  Alcohol-Tobacco     Smoking Status: never      Drug Use:  no.    Problems Prior to Update: 1)  Leg Pain, Bilateral  (ICD-729.5) 2)  Bronchitis-acute  (ICD-466.0) 3)  Glucose Intolerance  (ICD-271.3) 4)  Colonic Polyps, Hx of  (ICD-V12.72) 5)  Disc Disease, Lumbar  (ICD-722.52) 6)  Disc Disease, Cervical  (ICD-722.4) 7)  Peripheral Vascular Disease  (ICD-443.9) 8)  Hyperlipidemia  (ICD-272.4) 9)  Gerd  (ICD-530.81) 10)  Transient Ischemic Attack, Hx of  (ICD-V12.50)  Medications Prior to Update: 1)   None  Current Medications (verified): 1)  Crestor 20 Mg Tabs (Rosuvastatin Calcium) .Marland Kitchen.. 1 By Mouth Once Daily 2)  Aciphex 20 Mg Tbec (Rabeprazole Sodium) .Marland Kitchen.. 1 By Mouth Once Daily 3)  Calcium 1500 Mg Tabs (Calcium Carbonate) .Marland Kitchen.. 1 By Mouth Once Daily 4)  Multivitamins  Tabs (Multiple Vitamin) .Marland Kitchen.. 1 By Mouth Once Daily 5)  Aspir-Low 81 Mg Tbec (Aspirin) .Marland Kitchen.. 1 By Mouth Once Daily 6)  Fish Oil 1000 Mg Caps (Omega-3 Fatty Acids) .Marland Kitchen.. 1 By Mouth Once Daily  Allergies (verified): 1)  ! 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 Transient ischemic attack, hx of - right brain, 2008, vs migraine medical noncompliance hx GERD Hyperlipidemia Peripheral vascular disease - mild carotid 2008 cervical degenerative  spondylosis/disc disease normal coronary arteries by  cath 2007 lumbar disc disease Colonic polyps, hx of - dr Fayrene Fearing edwards/GI fatty liver glycose intolerance  Past Surgical History: s/p vocal cord polyps 1984 Tubal ligation  Family History: Reviewed history and no changes required. mother with hx of stroke, heart disease, HTN, DM sister with multiple myeloma father died with TB 3 sibs deceased with lung cancer, childbirth, and pna  Social History: Reviewed history and no changes required. 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     Head:  normocephalic and atraumatic.   Eyes:  vision grossly intact, pupils equal, and pupils round.   Ears:  R ear normal and L ear normal.   Nose:  no external deformity and no nasal discharge.   Mouth:  pharyngeal erythema and fair dentition.   Neck:  supple and no masses.   Lungs:  normal respiratory effort and normal breath sounds.   Heart:  normal rate and regular rhythm.   Abdomen:  soft, non-tender, and normal bowel sounds.   Pulses:  1+ bilat LE's dorsalis pedis Extremities:  no edema, no erythema  Neurologic:  alert & oriented X3 and cranial nerves II-XII intact.     Impression & Recommendations:  Problem # 1:  BRONCHITIS-ACUTE (ICD-466.0) resolving, prob viral, reassured, ok to follow  Problem # 2:  PERIPHERAL VASCULAR DISEASE (ICD-443.9)  mild carotid dz hx - to re-check carotids   Orders: Misc. Referral (Misc. Ref)  Problem # 3:  LEG PAIN, BILATERAL (ICD-729.5) wit neg dopplers and EMG/NCS < 1 yr per pt;  ok for tylenol as needed   Problem # 4:  HYPERLIPIDEMIA (ICD-272.4)  Her updated medication list for this problem includes:    Crestor 20 Mg Tabs (Rosuvastatin calcium) .Marland Kitchen... 1 by mouth once daily to re-start meds, urged complaince with meds and diet, Pt to continue diet efforts,goal LDL less than 70   Complete Medication List: 1)  Crestor 20 Mg Tabs (Rosuvastatin calcium) .Marland Kitchen.. 1 by mouth once daily 2)  Aciphex 20 Mg Tbec (Rabeprazole sodium) .Marland Kitchen.. 1 by mouth once daily 3)  Calcium 1500 Mg Tabs (Calcium carbonate) .Marland Kitchen.. 1 by mouth once daily 4)  Multivitamins Tabs (Multiple vitamin) .Marland Kitchen.. 1 by mouth once daily 5)  Aspir-low 81 Mg Tbec (Aspirin) .Marland Kitchen.. 1 by mouth once daily 6)  Fish Oil 1000 Mg Caps (Omega-3 fatty acids) .Marland Kitchen.. 1 by mouth once daily  Patient Instructions: 1)  You will be contacted about the referral(s) to: carotid dopplers 2)  at the front desk, please sign the Release of Information Form for your records to be sent 3)  you can take tylenol  arthritis for the leg pains 4)  please take all medications as prescribed 5)  Please schedule a follow-up appointment in October 2011 with CPX labs and: 6)  HbgA1C prior to visit, ICD-9: 790.2 7)  vit d  v70.0 Prescriptions: ACIPHEX 20 MG TBEC (RABEPRAZOLE SODIUM) 1 by mouth once daily  #90 x 3   Entered and Authorized by:   Corwin Levins MD   Signed by:   Corwin Levins MD on 08/29/2009   Method used:   Print then Give to Patient   RxID:   [id number] CRESTOR 20 MG TABS (ROSUVASTATIN CALCIUM) 1  by mouth once daily  #90 x 3   Entered and Authorized by:   Corwin Levins MD   Signed by:   Corwin Levins MD on 08/29/2009   Method used:   Print then Give to Patient   RxID:   [id number]    Immunization History:  Influenza Immunization History:    Influenza:  historical (03/30/2009)

## 2010-08-29 NOTE — Miscellaneous (Signed)
  Clinical Lists Changes  Problems: Added new problem of VOCAL CORD POLYP, HX OF (ICD-V13.8) Added new problem of ANXIETY (ICD-300.00) Added new problem of PERIPHERAL NEUROPATHY (ICD-356.9) Added new problem of POSITIVE PPD (ICD-795.5) Added new problem of DEPRESSION (ICD-311) Added new problem of URETHRAL STRICTURE (ICD-598.9) Added new problem of BARRETTS ESOPHAGUS (ICD-530.85) Added new problem of CONSTIPATION, CHRONIC (ICD-564.09) Allergies: Added new allergy or adverse reaction of LIPITOR (ATORVASTATIN) Observations: Added new observation of COLONNXTDUE: 08/2012 (10/03/2009 17:41) Added new observation of PSH REVIEWED: reviewed - no changes required (10/03/2009 17:41) Added new observation of PAST SURG HX: s/p vocal cord polyps 1984/chronic hoarseness Tubal ligation stress test neg 2003, 2004, and 2006 hx of right ankle fracture  (10/03/2009 17:41) Added new observation of DEPRESSION: yes (10/03/2009 17:41) Added new observation of PMH GERD: yes (10/03/2009 17:41) Added new observation of ALLERGY REV: Done (10/03/2009 17:41) Added new observation of PMH REVIEWED: reviewed - no changes required (10/03/2009 17:41) Added new observation of PMH NEUROPTH: yes (10/03/2009 17:41) Added new observation of HYPRLIPIDMIA: yes (10/03/2009 17:41) Added new observation of PAST MED HX: DJD - hands ? Transient ischemic attack, hx of - right brain, 2008, vs migraine medical noncompliance hx GERD Hyperlipidemia Peripheral vascular disease - mild carotid 2008 cervical degenerative spondylosis/disc disease normal coronary arteries by  cath 2007 lumbar disc disease/chronic back pain Colonic polyps, hx of - dr Fayrene Fearing edwards/GI fatty liver glycose intolerance Anxiety Peripheral neuropathy - severe, Dr Reynolds/Neuro PPD positive   June 2009 - referred to health Dept/ NEG there on repeat Depression hx of urethral stricture hx of Barretts Esophagus hx of RML pulm nodule 2004 CT  5 mm - Dr  Shelle Iron chronic constipation (10/03/2009 17:41) Added new observation of PMH ANXIETY: yes (10/03/2009 17:41) Removed observation of COLONOSCOPY: normal (07/30/2008 10:35) Added new observation of COLONOSCOPY: normal (08/29/2007 18:04) Added new observation of PAP SMEAR: normal (06/30/2007 17:43) Added new observation of TD BOOSTER: Tdap (02/27/2002 17:44)      Preventive Care Screening  Colonoscopy:    Date:  08/29/2007    Next Due:  08/2012    Results:  normal  Pap Smear:    Date:  06/30/2007    Results:  normal  Last Tetanus Booster:    Date:  02/27/2002    Results:  Tdap   Allergies: 1)  ! Pcn 2)  Lipitor (Atorvastatin)   Past History:  Past Medical History: DJD - hands ? Transient ischemic attack, hx of - right brain, 2008, vs migraine medical noncompliance hx GERD Hyperlipidemia Peripheral vascular disease - mild carotid 2008 cervical degenerative spondylosis/disc disease normal coronary arteries by  cath 2007 lumbar disc disease/chronic back pain Colonic polyps, hx of - dr Fayrene Fearing edwards/GI fatty liver glycose intolerance Anxiety Peripheral neuropathy - severe, Dr Reynolds/Neuro PPD positive   June 2009 - referred to health Dept/ NEG there on repeat Depression hx of urethral stricture hx of Barretts Esophagus hx of RML pulm nodule 2004 CT  5 mm - Dr Shelle Iron chronic constipation  Past Surgical History: s/p vocal cord polyps 1984/chronic hoarseness Tubal ligation stress test neg 2003, 2004, and 2006 hx of right ankle fracture

## 2010-08-29 NOTE — Miscellaneous (Signed)
Summary: Immunization Entry   Immunization History:  Influenza Immunization History:    Influenza:  historical (05/06/2010)  [facility] Vaccine, Flulaval GSK Lot# [id number] Site, Left Deltoid IM Date Administered, 05/06/2010

## 2010-08-29 NOTE — Progress Notes (Signed)
  Phone Note Other Incoming   Request: Send information Summary of Call: Request for records received from [facility]. Request forwarded to [facility].     

## 2010-08-29 NOTE — Miscellaneous (Signed)
Summary: Orders Update  Clinical Lists Changes  Problems: Added new problem of CAROTID ARTERY DISEASE (ICD-433.10) Orders: Added new Test order of Carotid Duplex (Carotid Duplex) - Signed 

## 2010-08-29 NOTE — Progress Notes (Signed)
  Phone Note Call from Patient Call back at [phone] or [phone]   Caller: Patient Call For: Dr Jonny Ruiz Summary of Call: Pt states she had carotid study a few wks ago but did not know about the phone tree. Pt requesting a nurse call her w/results. Pt still has pain below knees and it is keeping her up at night due to the leg pain. Please advise. Initial call taken by: Verdell Face,  September 22, 2009 9:21 AM  Follow-up for Phone Call        [facility] - MRI with mild dz bilat - f/u 2 yrs Signed by Corwin Levins MD on 09/06/2009 at 5:24 PM  What is dz?  What do recommend for patient leg pain? Please advise. Follow-up by: Lucious Groves,  September 22, 2009 9:34 AM  Additional Follow-up for Phone Call Additional follow up Details #1::        pt has only mild blockage in the carotids;  no change in medical treatment; she will be called to repeat the test in 2 yrs Additional Follow-up by: Corwin Levins MD,  September 22, 2009 1:07 PM    Additional Follow-up for Phone Call Additional follow up Details #2::    Left a message on pts answering machine to return my call. Follow-up by: Josph Macho RMA,  September 22, 2009 2:37 PM  Additional Follow-up for Phone Call Additional follow up Details #3:: Details for Additional Follow-up Action Taken: Alot of problems with back and legs. Had two epiderals in the last ten years. Would like a referral to someone. Additional Follow-up by: Josph Macho RMA,  September 22, 2009 3:05 PM   refer ortho Corwin Levins MD  September 22, 2009 3:10 PM   Informed pt that [facility] will contact her in the next few days with referral information. CF

## 2010-08-29 NOTE — Progress Notes (Signed)
----   Converted from flag ---- ---- 12/05/2009 8:45 AM, Corwin Levins MD wrote: please call pt - on chart review, we saw she mentioned seeing a new ortho on [address] - does she still want this? ------------------------------  called pt left msg to call back 12/05/2009    called pt and informed of above information. She does want to be referred to ortho on [facility] Dr. Hart [place]. 12/05/2009

## 2010-08-31 NOTE — Progress Notes (Signed)
  Phone Note Other Incoming   Request: Send information Summary of Call: Request received from [facility] forwarded to [facility].  .    

## 2010-08-31 NOTE — Letter (Signed)
Summary: Carman Ching MD/[facility] Lacie Draft MD/[facility]   Imported By: Lester Marlow 08/14/2010 09:56:42  _____________________________________________________________________  External Attachment:    Type:   Image     Comment:   External Document

## 2010-08-31 NOTE — Consult Note (Signed)
Summary: [facility]  [facility]   Imported By: Lester [place] 07/14/2010 10:40:53  _____________________________________________________________________  External Attachment:    Type:   Image     Comment:   External Document

## 2010-08-31 NOTE — Assessment & Plan Note (Signed)
Summary: LEFT HAND TINGLING/[name]/WEAK,ELEV BP/CD   Vital Signs:  Patient profile:   70 year old female Height:      66.5 inches (168.91 cm) Weight:      178 pounds (80.91 kg) O2 Sat:      97 % on Room air Temp:     99.0 degrees F (37.22 degrees C) oral Pulse rate:   68 / minute BP sitting:   112 / 72  (left arm) Cuff size:   large  Vitals Entered By: Orlan Leavens RMA (August 08, 2010 2:02 PM)  O2 Flow:  Room air CC: Tingling feeling in (L) hand & lips Is Patient Diabetic? No Pain Assessment Patient in pain? yes     Location: (L) hand & Lips Type: tingling Onset of pain  Pt states she woke up this morning (L) hand tingling, now going up her arm. Also lips feel tingling. denies any chest pain or SOB. Pt states she is not taking any of her medications   Primary Care Provider:  Corwin Levins MD  CC:  Tingling feeling in (L) hand & lips.  History of Present Illness: c/o numbness and tingling on left face onset 12-24h ago - worse since waking up +hx same - TIA x 2 not taking plavix or aggrenox due to skin changes and epistaxsis (pt self dc'd) took ASA 325 x 2 this AM feels tingling in left hand but no weakness denies dysphagia or dysarthria, no HA declines going to hospital due to need to provide care to demented spouse  Clinical Review Panels:  CBC   WBC:  3.6 (05/25/2010)   RBC:  4.60 (05/25/2010)   Hgb:  14.9 (05/25/2010)   Hct:  43.8 (05/25/2010)   Platelets:  172.0 (05/25/2010)   MCV  95.3 (05/25/2010)   MCHC  34.0 (05/25/2010)   RDW  15.2 (05/25/2010)   PMN:  43.3 (05/25/2010)   Lymphs:  43.4 (05/25/2010)   Monos:  9.2 (05/25/2010)   Eosinophils:  3.2 (05/25/2010)   Basophil:  0.9 (05/25/2010)  Complete Metabolic Panel   Glucose:  89 (05/25/2010)   Sodium:  136 (05/25/2010)   Potassium:  4.3 (05/25/2010)   Chloride:  101 (05/25/2010)   CO2:  30 (05/25/2010)   BUN:  9 (05/25/2010)   Creatinine:  0.8 (05/25/2010)   Albumin:  3.9 (05/25/2010)   Total  Protein:  6.7 (05/25/2010)   Calcium:  9.3 (05/25/2010)   Total Bili:  0.7 (05/25/2010)   Alk Phos:  63 (05/25/2010)   SGPT (ALT):  22 (05/25/2010)   SGOT (AST):  30 (05/25/2010)   Current Medications (verified): 1)  Pravastatin Sodium 40 Mg Tabs (Pravastatin Sodium) .Marland Kitchen.. 1po Once Daily 2)  Omeprazole 20 Mg Cpdr (Omeprazole) .... 2po Once Daily 3)  Calcium 1500 Mg Tabs (Calcium Carbonate) .Marland Kitchen.. 1 By Mouth Once Daily 4)  Multivitamins  Tabs (Multiple Vitamin) .Marland Kitchen.. 1 By Mouth Once Daily 5)  Fish Oil 1000 Mg Caps (Omega-3 Fatty Acids) .Marland Kitchen.. 1 By Mouth Once Daily 6)  Aggrenox 25-200 Mg Xr12h-Cap (Aspirin-Dipyridamole) .Marland Kitchen.. 1po Two Times A Day 7)  Alprazolam 0.25 Mg Tabs (Alprazolam) .Marland Kitchen.. 1po Two Times A Day As Needed  Allergies (verified): 1)  ! Pcn 2)  Lipitor (Atorvastatin)  Past History:  Past Medical History: DJD - hands  ? Transient ischemic attack, hx of - right brain, 2008, vs migraine medical noncompliance hx GERD Hyperlipidemia Peripheral vascular disease - mild carotid 2008 cervical degenerative spondylosis/disc disease normal coronary arteries by  cath 2007 lumbar  disc disease/chronic back pain Colonic polyps, hx of - dr Fayrene Fearing edwards/GI fatty liver glycose intolerance Anxiety Peripheral neuropathy - severe, Dr Reynolds/Neuro PPD positive   June 2009 - referred to health Dept/ NEG there on repeat Depression hx of urethral stricture hx of Barretts Esophagus hx of RML pulm nodule 2004 CT  5 mm - Dr Shelle Iron chronic constipation  Review of Systems  The patient denies vision loss, hoarseness, chest pain, muscle weakness, transient blindness, and difficulty walking.    Physical Exam  General:  overweight-appearing. alert, well-developed, well-nourished, and cooperative to examination.     Eyes:  vision grossly intact; pupils equal, round and reactive to light.  conjunctiva and lids normal.    Ears:  normal pinnae bilaterally, without erythema, swelling, or  tenderness to palpation. TMs clear, without effusion, or cerumen impaction. Hearing grossly normal bilaterally  Mouth:  teeth and gums in good repair; mucous membranes moist, without lesions or ulcers. oropharynx clear without exudate, no erythema.  Lungs:  normal respiratory effort, no intercostal retractions or use of accessory muscles; normal breath sounds bilaterally - no crackles and no wheezes.    Heart:  normal rate, regular rhythm, no murmur, and no rub. BLE without edema. Neurologic:  alert & oriented X3 , mild left facial droop including decreased nasolabial fold.  strength normal in all extremities with good grip, sensation intact to light touch, and gait normal. speech fluent without dysarthria or aphasia; follows commands with good comprehension.    Impression & Recommendations:  Problem # 1:  TIA (ICD-435.9) Assessment New suspect recurrent symptoms  - left face and UE paresthesia-  last hosp 10/2009 reviewed complicated by med noncompliance discussed need for ER/hosp urgent eval now - pt declines same but agrees to call 911 if symptoms worse agrees to resume Plavix and statin now - currently BP ok and will not tx earlier reported HTN due to concern for ongoing TIA symptoms  recheck with PCP in 48-72h for review of other needed eval, sooner if problems Her updated medication list for this problem includes:    Plavix 75 Mg Tabs (Clopidogrel bisulfate) .Marland Kitchen... 1 by mouth once daily    Aspirin 325 Mg Tabs (Aspirin) .Marland Kitchen... 1 by mouth once daily  Problem # 2:  HYPERLIPIDEMIA (ICD-272.4) complicated by noncompliance - not taking prava but will resume crestor now (has med supply  at home) Her updated medication list for this problem includes:    Crestor 20 Mg Tabs (Rosuvastatin calcium) .Marland Kitchen... 1 by mouth at bedtime  Labs Reviewed: SGOT: 30 (05/25/2010)   SGPT: 22 (05/25/2010)   HDL:46.10 (05/25/2010)  Chol:270 (05/25/2010)  Trig:146.0 (05/25/2010)  Problem # 3:  PERIPHERAL VASCULAR  DISEASE (ICD-443.9)  mild L carotid 40-60% - to re-check carotids US 10/2010 - ?sooner if new neuro symptoms  pt declines testing at this time- will rediscuss with PCP at f/u later this week Time spent with patient 30 minutes, more than 50% of this time was spent counseling patient on importance of attention to neuro deficits and new TIA - need for med compliance and close f/u - pt agrees to call 911 if symptoms worsen  Complete Medication List: 1)  Crestor 20 Mg Tabs (Rosuvastatin calcium) .Marland Kitchen.. 1 by mouth at bedtime 2)  Omeprazole 20 Mg Cpdr (Omeprazole) .... 2po once daily 3)  Calcium 1500 Mg Tabs (Calcium carbonate) .Marland Kitchen.. 1 by mouth once daily 4)  Multivitamins Tabs (Multiple vitamin) .Marland Kitchen.. 1 by mouth once daily 5)  Fish Oil 1000 Mg Caps (Omega-3  fatty acids) .Marland Kitchen.. 1 by mouth once daily 6)  Plavix 75 Mg Tabs (Clopidogrel bisulfate) .Marland Kitchen.. 1 by mouth once daily 7)  Alprazolam 0.25 Mg Tabs (Alprazolam) .Marland Kitchen.. 1po two times a day as needed 8)  Aspirin 325 Mg Tabs (Aspirin) .Marland Kitchen.. 1 by mouth once daily  Patient Instructions: 1)  it was good to see you today. 2)  resume the Plavix and Crestor NOW - today 3)  if any weakness, more numbness, trouble speaking or other problems, you need to call 911 and go to [facility] immediately 4)  Please schedule a follow-up appointment in 48-72 hours to recheck blood pressure, call sooner if problems.    Orders Added: 1)  Est. Patient Level IV [02725]

## 2010-08-31 NOTE — Assessment & Plan Note (Signed)
Summary: 24-48 HR FU  STC   Vital Signs:  Patient profile:   70 year old female Height:      66.5 inches Weight:      179.50 pounds BMI:     28.64 O2 Sat:      97 % on Room air Temp:     97.8 degrees F oral Pulse rate:   70 / minute BP sitting:   118 / 72  (left arm) Cuff size:   regular  Vitals Entered By: Zella Ball Ewing CMA Duncan Dull) (August 10, 2010 2:53 PM)  O2 Flow:  Room air  CC: Followup/RE   Primary Care Provider:  Corwin Levins MD  CC:  Followup/RE.  History of Present Illness: here to f/u; overall doing well; now resumed previous meds, vows good complaince, looking forward to plavix becoming generic later this yr;  Pt denies CP, worsening sob, doe, wheezing, orthopnea, pnd, worsening LE edema, palps, dizziness or syncope  Pt denies new neuro symptoms such as headache, facial or extremity weakness  or numbness as per last visit.  Pt denies polydipsia, polyuria.  Overall good compliance with meds, trying to follow low chol diet, wt stable, little excercise however .  Quite stressed over taking care of her demented husband but "getting by."  Has been a proponent of getting by with no meds, but now states even the xanax since last visit has made a big difference.  Denies worsening depressive symptoms, suicidal ideation, or panic.    No fever, wt loss, night sweats, loss of appetite or other constitutional symptoms   Problems Prior to Update: 1)  Wrist Pain, Bilateral  (ICD-719.43) 2)  Preventive Health Care  (ICD-V70.0) 3)  Low Back Pain, Chronic  (ICD-724.2) 4)  Tia  (ICD-435.9) 5)  Constipation, Chronic  (ICD-564.09) 6)  Barretts Esophagus  (ICD-530.85) 7)  Urethral Stricture  (ICD-598.9) 8)  Depression  (ICD-311) 9)  Positive Ppd  (ICD-795.5) 10)  Peripheral Neuropathy  (ICD-356.9) 11)  Anxiety  (ICD-300.00) 12)  Vocal Cord Polyp, Hx of  (ICD-V13.8) 13)  Carotid Artery Disease  (ICD-433.10) 14)  Leg Pain, Bilateral  (ICD-729.5) 15)  Glucose Intolerance  (ICD-271.3) 16)   Colonic Polyps, Hx of  (ICD-V12.72) 17)  Disc Disease, Lumbar  (ICD-722.52) 18)  Disc Disease, Cervical  (ICD-722.4) 19)  Peripheral Vascular Disease  (ICD-443.9) 20)  Hyperlipidemia  (ICD-272.4) 21)  Gerd  (ICD-530.81) 22)  Transient Ischemic Attack, Hx of  (ICD-V12.50)  Medications Prior to Update: 1)  Crestor 20 Mg Tabs (Rosuvastatin Calcium) .Marland Kitchen.. 1 By Mouth At Bedtime 2)  Omeprazole 20 Mg Cpdr (Omeprazole) .... 2po Once Daily 3)  Calcium 1500 Mg Tabs (Calcium Carbonate) .Marland Kitchen.. 1 By Mouth Once Daily 4)  Multivitamins  Tabs (Multiple Vitamin) .Marland Kitchen.. 1 By Mouth Once Daily 5)  Fish Oil 1000 Mg Caps (Omega-3 Fatty Acids) .Marland Kitchen.. 1 By Mouth Once Daily 6)  Plavix 75 Mg Tabs (Clopidogrel Bisulfate) .Marland Kitchen.. 1 By Mouth Once Daily 7)  Alprazolam 0.25 Mg Tabs (Alprazolam) .Marland Kitchen.. 1po Two Times A Day As Needed 8)  Aspirin 325 Mg Tabs (Aspirin) .Marland Kitchen.. 1 By Mouth Once Daily  Current Medications (verified): 1)  Crestor 20 Mg Tabs (Rosuvastatin Calcium) .Marland Kitchen.. 1 By Mouth At Bedtime 2)  Omeprazole 20 Mg Cpdr (Omeprazole) .... 2po Once Daily 3)  Calcium 1500 Mg Tabs (Calcium Carbonate) .Marland Kitchen.. 1 By Mouth Once Daily 4)  Multivitamins  Tabs (Multiple Vitamin) .Marland Kitchen.. 1 By Mouth Once Daily 5)  Fish Oil 1000 Mg Caps (Omega-3  Fatty Acids) .Marland Kitchen.. 1 By Mouth Once Daily 6)  Plavix 75 Mg Tabs (Clopidogrel Bisulfate) .Marland Kitchen.. 1 By Mouth Once Daily 7)  Alprazolam 0.25 Mg Tabs (Alprazolam) .Marland Kitchen.. 1po Two Times A Day As Needed 8)  Aspirin 325 Mg Tabs (Aspirin) .Marland Kitchen.. 1 By Mouth Once Daily  Allergies (verified): 1)  ! Pcn 2)  Lipitor (Atorvastatin)  Past History:  Past Medical History: Last updated: 08/08/2010 DJD - hands  ? Transient ischemic attack, hx of - right brain, 2008, vs migraine medical noncompliance hx GERD Hyperlipidemia Peripheral vascular disease - mild carotid 2008 cervical degenerative spondylosis/disc disease normal coronary arteries by  cath 2007 lumbar disc disease/chronic back pain Colonic polyps, hx of - dr  Fayrene Fearing edwards/GI fatty liver glycose intolerance Anxiety Peripheral neuropathy - severe, Dr Reynolds/Neuro PPD positive   June 2009 - referred to health Dept/ NEG there on repeat Depression hx of urethral stricture hx of Barretts Esophagus hx of RML pulm nodule 2004 CT  5 mm - Dr Shelle Iron chronic constipation  Past Surgical History: Last updated: 10/03/2009 s/p vocal cord polyps 1984/chronic hoarseness Tubal ligation stress test neg 2003, 2004, and 2006 hx of right ankle fracture  Social History: Last updated: 08/29/2009 Never Smoked Alcohol use-no Drug use-no 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 alert, well-developed, well-nourished, and cooperative to examination.     Head:  normocephalic and atraumatic.   Eyes:  vision grossly intact, pupils equal, and pupils round.   Ears:  R ear normal and L ear normal.   Nose:  no external deformity and no nasal discharge.   Mouth:  no gingival abnormalities and pharynx pink and moist.   Neck:  supple and no masses.   Lungs:  normal respiratory effort and normal breath sounds.   Heart:  normal rate and regular rhythm.   Extremities:  no edema, no erythema    Impression & Recommendations:  Problem # 1:  TIA (ICD-435.9)  Her updated medication list for this problem includes:    Plavix 75 Mg Tabs (Clopidogrel bisulfate) .Marland Kitchen... 1 by mouth once daily    Aspirin 325 Mg Tabs (Aspirin) .Marland Kitchen... 1 by mouth once daily resolved - stable overall by hx and exam, ok to continue meds/tx as is   Problem # 2:  ANXIETY (ICD-300.00)  Her updated medication list for this problem includes:    Alprazolam 0.25 Mg Tabs (Alprazolam) .Marland Kitchen... 1po two times a day as needed improvd - stable overall by hx and exam, ok to continue meds/tx as is   Problem # 3:   HYPERLIPIDEMIA (ICD-272.4)  Her updated medication list for this problem includes:    Crestor 20 Mg Tabs (Rosuvastatin calcium) .Marland Kitchen... 1 by mouth at bedtime  Labs Reviewed: SGOT: 30 (05/25/2010)   SGPT: 22 (05/25/2010)   HDL:46.10 (05/25/2010)  Chol:270 (05/25/2010)  Trig:146.0 (05/25/2010) d/w pt - very important to help reduce risk of further tia/cva;  Pt to continue diet efforts, good med tolerance; goal LDL less than 70  - to re-check labs next visit  - pt declines today  Problem # 4:  GLUCOSE INTOLERANCE (ICD-271.3) asympt - Pt to cont DM diet, excercise, wt control efforts; to check labs next visit as above - declines further today  Complete Medication List: 1)  Crestor 20 Mg Tabs (Rosuvastatin calcium) .Marland KitchenMarland KitchenMarland Kitchen  1 by mouth at bedtime 2)  Omeprazole 20 Mg Cpdr (Omeprazole) .... 2po once daily 3)  Calcium 1500 Mg Tabs (Calcium carbonate) .Marland Kitchen.. 1 by mouth once daily 4)  Multivitamins Tabs (Multiple vitamin) .Marland Kitchen.. 1 by mouth once daily 5)  Fish Oil 1000 Mg Caps (Omega-3 fatty acids) .Marland Kitchen.. 1 by mouth once daily 6)  Plavix 75 Mg Tabs (Clopidogrel bisulfate) .Marland Kitchen.. 1 by mouth once daily 7)  Alprazolam 0.25 Mg Tabs (Alprazolam) .Marland Kitchen.. 1po two times a day as needed 8)  Aspirin 325 Mg Tabs (Aspirin) .Marland Kitchen.. 1 by mouth once daily  Patient Instructions: 1)  Continue all previous medications as before this visit 2)  Please schedule a follow-up appointment in October 2012 for CPX with labs   Orders Added: 1)  Est. Patient Level IV [64403]

## 2010-09-12 ENCOUNTER — Emergency Department (HOSPITAL_COMMUNITY): Payer: Medicare Other

## 2010-09-12 ENCOUNTER — Observation Stay (HOSPITAL_COMMUNITY)
Admission: EM | Admit: 2010-09-12 | Discharge: 2010-09-12 | Disposition: A | Discharge status: Home / Self Care | Payer: Medicare Other | Attending: Internal Medicine | Admitting: Internal Medicine

## 2010-09-12 DIAGNOSIS — F411 Generalized anxiety disorder: Secondary | ICD-10-CM | POA: Insufficient documentation

## 2010-09-12 DIAGNOSIS — R0789 Other chest pain: Principal | ICD-10-CM | POA: Insufficient documentation

## 2010-09-12 DIAGNOSIS — Z8673 Personal history of transient ischemic attack (TIA), and cerebral infarction without residual deficits: Secondary | ICD-10-CM | POA: Insufficient documentation

## 2010-09-12 DIAGNOSIS — Z7902 Long term (current) use of antithrombotics/antiplatelets: Secondary | ICD-10-CM | POA: Insufficient documentation

## 2010-09-12 DIAGNOSIS — R0609 Other forms of dyspnea: Secondary | ICD-10-CM | POA: Insufficient documentation

## 2010-09-12 DIAGNOSIS — E785 Hyperlipidemia, unspecified: Secondary | ICD-10-CM | POA: Insufficient documentation

## 2010-09-12 DIAGNOSIS — Z79899 Other long term (current) drug therapy: Secondary | ICD-10-CM | POA: Insufficient documentation

## 2010-09-12 DIAGNOSIS — K3189 Other diseases of stomach and duodenum: Secondary | ICD-10-CM | POA: Insufficient documentation

## 2010-09-12 DIAGNOSIS — R0989 Other specified symptoms and signs involving the circulatory and respiratory systems: Secondary | ICD-10-CM | POA: Insufficient documentation

## 2010-09-12 DIAGNOSIS — I6529 Occlusion and stenosis of unspecified carotid artery: Secondary | ICD-10-CM | POA: Insufficient documentation

## 2010-09-12 LAB — POCT CARDIAC MARKERS
CKMB, poc: 2.3 ng/mL (ref 1.0–8.0)
Myoglobin, poc: 74.7 ng/mL (ref 12–200)
Troponin i, poc: <0.05 ng/mL (ref 0.00–0.09)

## 2010-09-12 LAB — LIPASE, BLOOD: Lipase: 44 U/L (ref 11–59)

## 2010-09-12 LAB — CBC
HCT: 43.4 % (ref 36.0–46.0)
Hemoglobin: 14.2 g/dL (ref 12.0–15.0)
MCH: 30.7 pg (ref 26.0–34.0)
MCHC: 32.7 g/dL (ref 30.0–36.0)
MCV: 93.9 fL (ref 78.0–100.0)
Platelets: 161 10*3/uL (ref 150–400)
RBC: 4.62 MIL/uL (ref 3.87–5.11)
RDW: 14.4 % (ref 11.5–15.5)
WBC: 4.7 10*3/uL (ref 4.0–10.5)

## 2010-09-12 LAB — CARDIAC PANEL(CRET KIN+CKTOT+MB+TROPI)
CK, MB: 3.4 ng/mL (ref 0.3–4.0)
CK, MB: 3.1 ng/mL (ref 0.3–4.0)
CK, MB: 2.2 ng/mL (ref 0.3–4.0)
Relative Index: 1.2 (ref 0.0–2.5)
Relative Index: 1.2 (ref 0.0–2.5)
Relative Index: 1 (ref 0.0–2.5)
Total CK: 273 U/L — ABNORMAL HIGH (ref 7–177)
Total CK: 249 U/L — ABNORMAL HIGH (ref 7–177)
Total CK: 210 U/L — ABNORMAL HIGH (ref 7–177)
Troponin I: <0.01 ng/mL (ref 0.00–0.06)
Troponin I: 0.01 ng/mL (ref 0.00–0.06)
Troponin I: 0.01 ng/mL (ref 0.00–0.06)

## 2010-09-12 LAB — COMPREHENSIVE METABOLIC PANEL
ALT: 22 U/L (ref 0–35)
AST: 26 U/L (ref 0–37)
Albumin: 3.8 g/dL (ref 3.5–5.2)
Alkaline Phosphatase: 61 U/L (ref 39–117)
BUN: 9 mg/dL (ref 6–23)
CO2: 28 mEq/L (ref 19–32)
Calcium: 9.5 mg/dL (ref 8.4–10.5)
Chloride: 105 mEq/L (ref 96–112)
Creatinine, Ser: 0.89 mg/dL (ref 0.4–1.2)
GFR calc Af Amer: >60 mL/min (ref >60)
GFR calc non Af Amer: >60 mL/min (ref >60)
Glucose, Bld: 123 mg/dL — ABNORMAL HIGH (ref 70–99)
Potassium: 3.7 mEq/L (ref 3.5–5.1)
Sodium: 141 mEq/L (ref 135–145)
Total Bilirubin: 0.7 mg/dL (ref 0.3–1.2)
Total Protein: 6.6 g/dL (ref 6.0–8.3)

## 2010-09-12 LAB — LIPID PANEL
Cholesterol: 179 mg/dL (ref 0–200)
HDL: 48 mg/dL (ref >39)
LDL Cholesterol: 104 mg/dL — ABNORMAL HIGH (ref 0–99)
Total CHOL/HDL Ratio: 3.7 RATIO
Triglycerides: 133 mg/dL (ref <150)
VLDL: 27 mg/dL (ref 0–40)

## 2010-09-12 LAB — D-DIMER, QUANTITATIVE: D-Dimer, Quant: <0.22 ug/mL-FEU (ref 0.00–0.48)

## 2010-09-12 LAB — HEMOGLOBIN A1C
Hgb A1c MFr Bld: 6 % — ABNORMAL HIGH (ref <5.7)
Mean Plasma Glucose: 126 mg/dL — ABNORMAL HIGH (ref <117)

## 2010-09-12 LAB — TSH: TSH: 1.271 u[IU]/mL (ref 0.350–4.500)

## 2010-09-15 NOTE — H&P (Signed)
[name], [name]               ACCOUNT NO.:  [id number]  MEDICAL RECORD NO.:  [id number]           PATIENT TYPE:  E  LOCATION:  [facility]                         FACILITY:  [facility]  PHYSICIAN:  Mariea Stable, MD   DATE OF BIRTH:  Jan 25, 1941  DATE OF ADMISSION:  09/12/2010 DATE OF DISCHARGE:                             HISTORY & PHYSICAL   PRIMARY CARE PHYSICIAN:  Corwin Levins, MD  CHIEF COMPLAINT:  Chest pain.  HISTORY OF PRESENT ILLNESS:  Ms. Pavy is a 70 year old woman with past medical history significant for TIA with left carotid artery stenosis of 40% to 59%, hyperlipidemia, who presents with chief complaint of chest pain.  She reports that chest pain started approximately 10:00 p.m. the night prior to admission.  She was lying in bed when this started.  She describes it as "gas pain" and further classified it as pressure like 8/10 at its worst intensity, radiating to her back and located in the epigastric region.  She says it was associated with belching.  EMS was called and arrived approximately at 1:00 p.m. and I administered aspirin and sublingual nitroglycerin with subsequent resolution of the pain.  She is currently here without any symptoms.  PAST MEDICAL HISTORY: 1. TIA with left carotid artery stenosis of 40% to 59%. 2. Hyperlipidemia. 3. Gastroesophageal reflux disease. 4. History of migraine headaches. 5. History of musculoskeletal pain. 6. Anxiety disorder.  MEDICATIONS: 1. Plavix 75 mg p.o. daily. 2. Crestor 20 mg p.o. daily. 3. Omeprazole  20 mg p.o. daily. 4. Alprazolam 0.5 mg p.o. b.i.d. p.r.n. anxiety.  ALLERGIES:  PENICILLIN.  SOCIAL HISTORY:  The patient lives in [place] with her husband.  She also has a son and a daughter that lives in [place].  She has never smoked and never has drank alcohol and has never done illicit drugs. She states that in case of any emergency and/or need for medical decision making her son, Arron Lunde,  and/or his wife, Saul Conrado, should be contacted.  Son's phone number is [phone], her daughter-in-law's phone number is [phone].  FAMILY HISTORY:  Noncontributory.  REVIEW OF SYSTEMS:  As per HPI.  All others reviewed and negative.  PHYSICAL EXAM:  VITAL SIGNS:  Temperature 98.0, blood pressure 132/73, heart rate 61, respirations 19, oxygen saturation 99% on room air. GENERAL:  She is an elderly woman, lying in bed in no acute distress, who is very pleasant. HEENT:  Head is normocephalic and atraumatic.  Pupils are equally round and reactive to light and accommodation.  Extraocular movements are intact.  Sclerae anicteric.  Mucous membranes are moist.  There is no oral pharyngeal lesion. NECK:  Supple.  There is no JVD or carotid bruits. LUNGS:  Have good air movement bilaterally and clear to auscultation. Heart:  There is a normal S1-S2 with regular rate and rhythm.  There is no murmurs, gallops or rubs. ABDOMEN:  Positive bowel sounds.  Soft, nontender, nondistended with no rebound or guarding.  No organomegaly appreciated. EXTREMITIES:  There is no cyanosis, clubbing or edema. NEUROLOGIC:  Patient is awake, alert and oriented x3.  Cranial nerves are intact.  Strength is intact.  Sensation is intact.  LABORATORY DATA:  WBC 4.7, hemoglobin 14.2, platelets 161.  Point-of- care cardiac markers were negative x1.  Sodium 141, potassium 3.7, chloride 105, bicarb 28, glucose 123, BUN 9, creatinine 0.89.  LFTs were within normal limits.  IMAGING:  EKG shows sinus rhythm without any acute ischemic changes.  Chest x-ray shows no acute cardiopulmonary disease.  ASSESSMENT AND PLAN: 1. Chest pain, this sounds somewhat atypical.  The patient does report     a lot of gas/belching with the pain.  Pain did resolve with aspirin     and nitroglycerin administration by EMS, although this could be     related to potentially esophageal spasm.  At this point, pain is     completely  resolved and there are no signs of ischemia on EKG or     point-of-care cardiac enzymes.  Furthermore, the patient had a     cardiac catheterization in November 2007 with normal epicardial     coronary vessels.  At this point, we will admit the patient to     telemetry.  We will cycle cardiac enzymes and risk stratify with a     lipid panel A1c.  We will continue the patient on her Plavix and     statin.  Furthermore, we will continue the patient's PPI at home     dose. 2. Hyperlipidemia, we will check a fasting lipid panel, continue the     patient's home dose statin. 3. Left carotid artery stenosis of 40% to 60%.  Again as mentioned     above, we will continue the patient's statin and will re-stratify     with a hemoglobin A1c. 4. Anxiety.  We will continue the patient's alprazolam at home dose.     Mariea Stable, MD     MA/MEDQ  D:  09/12/2010  T:  09/12/2010  Job:  [id number]  cc:   Corwin Levins, MD  Electronically Signed by Mariea Stable MD on 09/15/2010 09:00:09 AM

## 2010-09-22 NOTE — Discharge Summary (Signed)
NAMEJACOBY, [name]               ACCOUNT NO.:  [id number]  MEDICAL RECORD NO.:  [id number]           PATIENT TYPE:  I  LOCATION:  6524                         FACILITY:  [facility]  PHYSICIAN:  Rock Nephew, MD       DATE OF BIRTH:  29-Oct-1940  DATE OF ADMISSION:  09/12/2010 DATE OF DISCHARGE:  09/12/2010                        DISCHARGE SUMMARY - REFERRING   PRIMARY CARE PHYSICIAN:  Corwin Levins, MD  DISCHARGE DIAGNOSES: 1. Atypical chest pain. 2. Hyperlipidemia. 3. Dyspepsia. 4. Left carotid artery stenosis. 5. Anxiety.  DISCHARGE MEDICATIONS: 1. Pantoprazole 40 mg p.o. daily. 2. Crestor 20 mg p.o. daily. 3. Plavix 75 mg p.o. daily. 4. Xanax 0.5 mg p.o. b.i.d. 5. Maalox 10 mL p.o. q.4 h p.r.n. dyspepsia. 6. Omeprazole 20 mg p.o. daily.  DISPOSITION:  The patient is discharged home.  FOLLOWUP:  The patient should follow up with Dr. Oliver Barre within 1 week.  The patient is to obtain a referral to see cardiologist.  The patient should also see Dr. Randa Evens in 2-3 weeks.  CONSULTATIONS ON THIS CASE:  None.  PROCEDURES PERFORMED:  Two-D echo is pending.  The patient also had the chest x-ray two-view; lungs were clear, heart size was normal.  CHIEF COMPLAINT:  Chest pain.  BRIEF HISTORY AND PHYSICAL:  [name] is a 70 year old female with a past medical history significant for TIA and left carotid artery stenosis, hyperlipidemia, who presents with chief complaint of chest pain.  The patient reports that she was passing gas, having some belching.  She had epigastric pain, was not associated with shortness of breath, no radiation.  The patient was brought in to the ED.  In the ED, the patient's initial cardiac enzymes have been negative.  The patient's EKG has no changes from the old EKG.  HOSPITAL COURSE: 1. Chest pain:  The patient is admitted to the hospital in telemetry.     The patient is chest painfree.  The patient's cardiac enzymes were     negative x1.  The  patient's EKG has no changes.  The patient is     continued on Plavix as well as Crestor.  The patient will have a 2-     D echocardiogram done.  The 2-D echocardiogram has no changes.  The     patient will be discharged home with a followup with Dr. Oliver Barre     and referral to Cardiology.  This pain also seemed possibly to be     gastrointestinal and the patient may need an upper endoscopy and     follow up with Dr. Carman Ching of [facility].  The patient should     also be continued on PPI. 2. GERD:  The patient was continued on PPI. 3. Hyperlipidemia:  The patient had a full lipid profile checked.  The     patient's LDL was 104, HDL was 48, triglycerides were 133. 4. Left carotid artery stenosis:  The patient will be continued on     statin and Plavix. 5. Mild hypoglycemia:  We will also check a hemoglobin A1c for the     patient.  6. Anxiety:  The patient was continued on alprazolam at home dose.  Note:  This is not an official document until electronically signed.     Rock Nephew, MD     NH/MEDQ  D:  09/12/2010  T:  09/12/2010  Job:  [id number]  cc:   Corwin Levins, MD  Electronically Signed by Rock Nephew MD on 09/22/2010 06:23:44 PM

## 2010-10-18 LAB — CARDIAC PANEL(CRET KIN+CKTOT+MB+TROPI)
CK, MB: 2.5 ng/mL (ref 0.3–4.0)
Relative Index: 1 (ref 0.0–2.5)
Relative Index: 1 (ref 0.0–2.5)
Total CK: 241 U/L — ABNORMAL HIGH (ref 7–177)
Total CK: 304 U/L — ABNORMAL HIGH (ref 7–177)
Troponin I: 0.01 ng/mL (ref 0.00–0.06)

## 2010-10-18 LAB — TSH: TSH: 1.098 u[IU]/mL (ref 0.350–4.500)

## 2010-10-18 LAB — CBC
HCT: 41.8 % (ref 36.0–46.0)
HCT: 41.8 % (ref 36.0–46.0)
HCT: 43.6 % (ref 36.0–46.0)
Hemoglobin: 13.8 g/dL (ref 12.0–15.0)
Hemoglobin: 14.7 g/dL (ref 12.0–15.0)
MCHC: 33 g/dL (ref 30.0–36.0)
MCV: 95.8 fL (ref 78.0–100.0)
MCV: 96.3 fL (ref 78.0–100.0)
Platelets: 141 10*3/uL — ABNORMAL LOW (ref 150–400)
RBC: 4.34 MIL/uL (ref 3.87–5.11)
RBC: 4.56 MIL/uL (ref 3.87–5.11)
RDW: 15.1 % (ref 11.5–15.5)
WBC: 3.7 10*3/uL — ABNORMAL LOW (ref 4.0–10.5)
WBC: 4.6 10*3/uL (ref 4.0–10.5)

## 2010-10-18 LAB — DIFFERENTIAL
Basophils Absolute: 0 10*3/uL (ref 0.0–0.1)
Basophils Relative: 1 % (ref 0–1)
Eosinophils Absolute: 0.3 10*3/uL (ref 0.0–0.7)
Eosinophils Absolute: 0.3 10*3/uL (ref 0.0–0.7)
Eosinophils Relative: 7 % — ABNORMAL HIGH (ref 0–5)
Eosinophils Relative: 8 % — ABNORMAL HIGH (ref 0–5)
Lymphocytes Relative: 40 % (ref 12–46)
Lymphs Abs: 2.4 10*3/uL (ref 0.7–4.0)
Monocytes Absolute: 0.4 10*3/uL (ref 0.1–1.0)
Monocytes Relative: 9 % (ref 3–12)
Neutrophils Relative %: 41 % — ABNORMAL LOW (ref 43–77)

## 2010-10-18 LAB — LIPID PANEL
LDL Cholesterol: 126 mg/dL — ABNORMAL HIGH (ref 0–99)
Triglycerides: 126 mg/dL (ref <150)

## 2010-10-18 LAB — COMPREHENSIVE METABOLIC PANEL
AST: 28 U/L (ref 0–37)
CO2: 30 mEq/L (ref 19–32)
Chloride: 107 mEq/L (ref 96–112)
Creatinine, Ser: 0.86 mg/dL (ref 0.4–1.2)
GFR calc Af Amer: >60 mL/min (ref >60)
GFR calc non Af Amer: >60 mL/min (ref >60)
Glucose, Bld: 108 mg/dL — ABNORMAL HIGH (ref 70–99)
Total Bilirubin: 0.7 mg/dL (ref 0.3–1.2)

## 2010-10-18 LAB — APTT: aPTT: 32 seconds (ref 24–37)

## 2010-10-18 LAB — BASIC METABOLIC PANEL
BUN: 9 mg/dL (ref 6–23)
CO2: 31 mEq/L (ref 19–32)
Calcium: 8.9 mg/dL (ref 8.4–10.5)
Chloride: 105 mEq/L (ref 96–112)
Creatinine, Ser: 0.79 mg/dL (ref 0.4–1.2)
GFR calc Af Amer: >60 mL/min (ref >60)
GFR calc non Af Amer: >60 mL/min (ref >60)
Glucose, Bld: 106 mg/dL — ABNORMAL HIGH (ref 70–99)
Potassium: 3.9 mEq/L (ref 3.5–5.1)

## 2010-10-18 LAB — HEMOGLOBIN A1C: Hgb A1c MFr Bld: 6.2 % — ABNORMAL HIGH (ref 4.6–6.1)

## 2010-10-30 ENCOUNTER — Telehealth: Payer: Self-pay | Admitting: Internal Medicine

## 2010-10-30 DIAGNOSIS — I6529 Occlusion and stenosis of unspecified carotid artery: Secondary | ICD-10-CM

## 2010-10-30 NOTE — Telephone Encounter (Signed)
To robin    Pt is due for f/u carotid dopplers April 2012  Please call pt;  If she has not been contacted, we will need to order

## 2010-10-31 NOTE — Telephone Encounter (Signed)
Called patient; left message to call back.

## 2010-10-31 NOTE — Telephone Encounter (Signed)
Order done per emr 

## 2010-10-31 NOTE — Telephone Encounter (Signed)
Informed patient of information. She has not been contacted and does want this to be scheduled.

## 2010-11-09 ENCOUNTER — Encounter: Payer: Medicare Other | Admitting: *Deleted

## 2010-11-13 ENCOUNTER — Other Ambulatory Visit: Payer: Self-pay | Admitting: Cardiology

## 2010-11-13 DIAGNOSIS — I6529 Occlusion and stenosis of unspecified carotid artery: Secondary | ICD-10-CM

## 2010-11-14 ENCOUNTER — Ambulatory Visit (INDEPENDENT_AMBULATORY_CARE_PROVIDER_SITE_OTHER): Payer: Medicare Other | Admitting: *Deleted

## 2010-11-14 ENCOUNTER — Other Ambulatory Visit: Payer: Self-pay | Admitting: *Deleted

## 2010-11-14 ENCOUNTER — Other Ambulatory Visit: Payer: Self-pay | Admitting: Internal Medicine

## 2010-11-14 DIAGNOSIS — I6529 Occlusion and stenosis of unspecified carotid artery: Secondary | ICD-10-CM

## 2010-11-14 NOTE — Telephone Encounter (Signed)
Faxed hardcopy to patients pharmacy 

## 2010-11-16 ENCOUNTER — Encounter: Payer: Self-pay | Admitting: Internal Medicine

## 2010-11-22 ENCOUNTER — Other Ambulatory Visit: Payer: Self-pay | Admitting: Gastroenterology

## 2010-11-24 ENCOUNTER — Ambulatory Visit
Admission: RE | Admit: 2010-11-24 | Discharge: 2010-11-24 | Disposition: A | Discharge status: Home / Self Care | Payer: Medicare Other | Source: Ambulatory Visit | Attending: Gastroenterology | Admitting: Gastroenterology

## 2010-12-12 NOTE — H&P (Signed)
[name], [name]               ACCOUNT NO.:  [id number]   MEDICAL RECORD NO.:  [id number]          PATIENT TYPE:  INP   LOCATION:  1410                         FACILITY:  [facility]   PHYSICIAN:  Kela Millin, M.D.DATE OF BIRTH:  Nov 08, 1940   DATE OF ADMISSION:  03/04/2007  DATE OF DISCHARGE:                              HISTORY & PHYSICAL   PRIMARY CARE PHYSICIAN:  Chales Salmon. Abigail Miyamoto, M.D.   CHIEF COMPLAINT:  Slurred speech as well as chest pain with left arm  paresthesia.   HISTORY OF PRESENT ILLNESS:  The patient is a pleasant 70 year old black  female with a past medical history significant for GERD, dyslipidemia,  question history of coronary artery disease but noted to have had a  cardiac catheterization done in 2007 which showed normal coronaries, who  presents with above complaint.  She states that when she woke up on the  a.m. of presentation, she received a phone call.  While speaking on the  phone, it was noted that her speech was slurred, and she also noted some  drooling out of the left corner of her mouth.  She also reports that she  was unable to turn her head to the left side, and it was painful.  Ms.  Matin states that about one week ago, she had an episode at work  where as she got into the standing position, she became nauseous with  vomiting and also right-sided headaches.  She states that as the  headaches have been intermittent over the past week and that this  morning her left eye looked red, but she denies any acute change in her  blurred vision (she uses glasses and states that for some time she has  not been able to see as clearly with the glasses).  She admits to  generalized weakness during the episode of slurred speech this morning  but denies focal weakness.  Also denies dysphagia.  The patient admits  that she has been noncompliant with her AcipHex as well as her Lipitor.  She had some episodes of right-sided chest discomfort.  When she  described the symptoms to one of her friends that is a Engineer, civil (consulting), she asked  if she had been taking her medications for GERD, which she stated she  had not.  It was recommended to her to begin taking them, but she did  not get the chance to pick up her prescriptions before she came to the  ER.  Patient also admits to numbness and tingling down her left arm in  the past week.  She denies shortness of breath, diaphoresis, melena, and  no hematochezia.  The patient reports that the slurred speech, drooling  from the left side of the mouth, describes it for about 30 minutes and  resolves, but the neck pain was persisting.   In the ER, the patient had workup including a CT scan, which was  negative for acute intracranial abnormality, and an MRI of her brain was  done that showed probable atherosclerotic disease of both posterior  cerebral arteries, no acute ischemia.  MRA of the neck was  done, and per  radiologist, suspicious of hemodynamic stenosis in the ICA proximally  and correlation with carotid Doppler ultrasound recommended.  She had  point-of-care markers which were negative in the ER, and she is admitted  for further evaluation and management.   PAST MEDICAL HISTORY:  As stated above.   MEDICATIONS:  Patient states that she has not been taking any but was  supposed to be on Lipitor and AcipHex.  She states that the Lipitor she  stopped because she thought it was causing her headaches.   ALLERGIES:  PENICILLIN.   SOCIAL HISTORY:  Denies tobacco.  She also denies alcohol.  No illicit  drug use.   FAMILY HISTORY:  Her mother had a stroke, and her sister multiple  myeloma.   REVIEW OF SYSTEMS:  As per HPI.  Other review of systems negative.   PHYSICAL EXAMINATION:  GENERAL:  The patient is a very pleasant elderly  black female.  She is alert and oriented in no apparent distress.  VITAL SIGNS:  Temperature is 97.2 with a blood pressure of 120/71.  Her  pulse is 60.  Respiratory rate  20.  O2 sat of 99%.  HEENT:  PERRL.  EOMI.  Sclerae are anicteric.  No oral exudates.  Moist  mucous membranes.  NECK:  Decreased lateral rotation to the left (range of motion limited  by pain).  She has tightness/increased muscle tone over her left  cervical area posteriorly.  No adenopathy and no JVD appreciated.  LUNGS:  Clear to auscultation bilaterally.  No crackles or wheezes.  CARDIOVASCULAR:  Regular rate and rhythm.  Normal S1 and S2.  ABDOMEN:  Soft.  Normal bowel sounds present.  Nontender, nondistended.  No organomegaly.  No masses palpable.  EXTREMITIES:  No clubbing, cyanosis or edema.  NEURO:  She is alert and oriented x3.  Cranial nerves II-XII are grossly  intact.  Her strength is 5/5 and symmetric.  Her tongue is midline.  Her  plantar reflexes are equivocal.   ASSESSMENT/PLAN:  1. Probable transient ischemic attack:  As discussed above, MRI of her      brain, as above, negative for a stroke.  An MRA of her neck does      not reveal any dissection.  There is a question of proximal      stenosis.  We will obtain carotid ultrasound, as recommended, for      further evaluation.  We will place the patient on aspirin.  Also      obtain a 2D echo, fasting lipid profile, homocysteine level, follow      and consult neuro as appropriate.  2. Chest pain:  Will obtain cardiac enzymes and EKG.  Also recheck the      patient's fasting lipid profile.  She states that she has had      noncompliance with Lipitor.  Follow and consider cardiology      consultation pending cardiac enzymes.  3. As noted above, the patient has a history of gastroesophageal      reflux disease and has been noncompliant with her AcipHex.  Will      resume PPI.  4. Neck pain:  Question torticollis.  MRA, as above.  Negative for      dissection.  Muscle relaxant for symptom relief.  Also, x-ray of      the neck to evaluate for degenerative changes and follow.  5. ?history coronary artery disease:  As per  HPI, the patient had a  cath in 2007 with normal coronaries.  Obtain cardiac enzymes, as      above, and follow.      Kela Millin, M.D.  Electronically Signed     ACV/MEDQ  D:  03/04/2007  T:  03/05/2007  Job:  [id number]   cc:   Chales Salmon. Abigail Miyamoto, M.D.  Fax: [phone]

## 2010-12-12 NOTE — Discharge Summary (Signed)
[name], [name]               ACCOUNT NO.:  [id number]   MEDICAL RECORD NO.:  [id number]          PATIENT TYPE:  INP   LOCATION:  1410                         FACILITY:  [facility]   PHYSICIAN:  Corinna L. Lendell Caprice, MDDATE OF BIRTH:  20-Feb-1941   DATE OF ADMISSION:  03/04/2007  DATE OF DISCHARGE:  03/07/2007                               DISCHARGE SUMMARY   DISCHARGE DIAGNOSES:  1. Right brain TIA consider migraine.  2. Hyperlipidemia.  3. Gastroesophageal reflux disease.  4. Musculoskeletal chest pain.  5. Acute sinusitis.  6. Left neck pain secondary to muscle spasm.  7. Leg numbness nightly, possibly due to restless leg syndrome.  8. Gastroesophageal reflux disease.  9. Noncompliance.   DISCHARGE MEDICATIONS:  1. Lipitor 28 mg nightly.  She will need repeat fasting lipids in 3      months and liver function tests.  2. Aciphex or Prilosec daily.  3. Aspirin 325 mg a day.  4. Trial of nortriptyline 10 mg nightly.  5. Tylenol as needed for pain.   CONDITION:  Stable.   CONSULTATIONS:  Dr. Avie Echevaria.   PROCEDURES:  None.   DISCHARGE INSTRUCTIONS:  1. Diet: Should be low cholesterol.  2. Activity: She is encouraged to do aerobic exercise at least three      times a week, preferably five times a week or more for 30 minutes      or greater.  3. Follow up with Dr. Avie Echevaria on September 11 at 8:00 a.m. for      followup of TIA, other symptoms and continued need for      nortriptyline.  4. Follow up with Dr. Abigail Miyamoto in a month as previously scheduled.   LABORATORY DATA:  Erythrocyte sedimentation rate 6, total cholesterol  286, triglycerides 161, LDL 217, HDL 37, CPK 249, MB 3.0, troponin 0.04,  D-dimer 0.28.  Homocystine 10.7.  RPR nonreactive.  Ferritin is pending.  Will need follow-up appointment with Dr. Sandria Manly.  CBC significant for  white blood cell count of 3.7 with a normal differential.  Hemoglobin  15.2, otherwise, unremarkable.  Liver function tests  unremarkable.  Basic metabolic panel unremarkable.   SPECIAL STUDIES AND RADIOLOGY:  EKG showed sinus bradycardia with a rate  of 54, nonspecific T-wave abnormalities, no significant change from  previous.  CT of the head without contrast showed nothing acute.  Two  views of the chest showed no active cardiopulmonary disease.  Mild  thoracic spondylosis.  MRI of the brain showed no evidence for acute  ischemia, nonspecific subcortical white matter hyperintensities  supratentorially, mild mucosal thickening in the ethmoids and frontal  sinuses.  MRA of the head showed no gross evidence of occlusions or  severe stenosis.  Small focal outpouching of the left MCA bifurcation  which probably represents confluence of the bifurcation branches with  tiny aneurysms felt to be less likely, probable mild arteriosclerotic  changes involving the posterior cerebral arteries, right greater than  left.  MRA of the neck showed focal transverse hypointensity of the left  internal carotid artery at the bulb with decreased caliber.  May  represent plaque with significant stenosis, difficult to ascertain,  possibly 70% or more correlation with ultrasound suggested.  Mild  atherosclerotic disease in the right internal carotid artery at the  bulb.  Carotid Dopplers showed mild noncalcific plaque throughout, left  mild calcific plaque at the ICA origin, vertebral artery flow antegrade  bilaterally.  No significant ICA stenosis noted.  X-ray of the C spine  showed spondylosis, moderate left C5-6 neural foraminal narrowing,  nothing acute.  MRI of the C-spine showed degenerative spondylosis at C5-  6 with osteophytic encroachment of the neural foramina bilaterally that  could affect either or both of the C6 nerve roots.  Shallow disk  protrusion of C4-5, C6-7 and C7-T1, but without evidence of neural  compression.  Echocardiogram showed no source of embolus.  Ejection  fraction 60%.  No regional wall motion  abnormalities, normal left  ventricular wall thickness.   HISTORY AND HOSPITAL COURSE:  [name] is a very pleasant 70-year-  old black female patient of Dr. Abigail Miyamoto who presented with frontal  headache.  She had previously had right sided frontal headache.  She was  unable to turn her head to the left side and her left neck hurt.  She  woke up in the morning, and while speaking on the phone, it was noted  that her speech was slurred and she was having some drooling from the  left side of her mouth.  She reported that intermittently her left eye  had looked red over the previous week, and that she had had an episode a  week prior to admission where she became nauseated, vomiting and had a  right-sided headache.  She came to the emergency room where she was  found to have a nonfocal neurologic exam, please see H&P for complete  admission details.  She had unremarkable vital signs.  She has a history  of gastroesophageal reflux disease and hyperlipidemia and has been  noncompliant with her proton pump inhibitor and Lipitor.  A CT of the  brain was negative.  MRI of the brain showed nothing acute, but there  was concern of significant left ICA stenosis.  The carotid Dopplers,  however, showed no significant stenosis.  The patient was admitted to  telemetry where she remained in normal sinus rhythm.  The day after  admission, she did complain of the left side of her face starting to  feel tight again.  Therefore, Neurology was consulted.  Please see the  consult note.  Dr. Sandria Manly noted that the patient had been seen in his  office previously for leg pain without specific etiology.  He felt that  her episode during this hospitalization was most likely a right brain  transient ischemic attack, but in light of the headaches, consider  migraine.  She subsequently also complained of leg numbness at  nighttime, and he entertained the possibility of restless leg syndrome.  Initially, he had  placed her on verapamil 80 mg t.i.d. for potential  migraine, but she reportedly has a history of hypotension, and this was  changed to nortriptyline nightly.  She had been started on 325 mg of  aspirin daily on admission.  She had not taken aspirin regularly as an  outpatient.  She was, in fact, to have significant hyperlipidemia, and  the importance of lifestyle modification and compliance with medication  was stressed. Apparently, her husband has been ill, and she has  neglected her own health as a result.   Her headache was frontal  in nature when I evaluated the patient.  She  also complained of some postnasal drip and given those symptoms and the  result of the MRI, I suspected acute sinusitis and placed her on Afrin.  She had improvement in her headache, and significant sinus drainage.  I  do not believe that she needs antibiotics at this time, but this may be  needed in the future, if sinusitis continues to be a problem.   She had also complained of some sharp right-sided chest pain.  A D-dimer  was normal.  Proton pump inhibitor was resumed, and MI was ruled out.  The chest pain resolved.   The patient's son also remembers that his mother told him she had an  irregular heart beat, and he was concerned that she may have a history  of atrial fibrillation.  She remained in normal sinus rhythm throughout  her hospitalization, and I have spoken to Dr. Recardo Evangelist nurse who has  reviewed her chart and reported no history of atrial fibrillation that  they know of.  I suspect that this irregular heart beat is not in fact  atrial fibrillation as she has never been on Coumadin or even an aspirin  a day.  The patient's left neck pain was felt to be muscle spasm.  The  Flexeril did not help.  I ordered Tylenol as needed and a heating pad,  but as of yet, she has yet to receive a heating pad.  The C-spine x-ray  was found to be as above, and she eventually told me about some periods  of left  arm tingling.  Therefore, I ordered an MRI of the C-spine to  rule out cord compression or significant nerve root compression.  This  was not the case.  She did, however, have spondylosis and degenerative  disease.  Apparently, however, she does not suffer from neck pain  chronically.  Her neck pain actually has improved since she has been  here.  In discussion with staff at [facility], she  has had several no shows, and the patient admits that she does not go to  the doctor very frequently.  I stressed the importance to both her and  her son about compliance with not only medications but follow up visits,  and they voiced understanding.  I have also instructed the patient to  come to the emergency room immediately or call 9-1-1 for any concern  about stroke symptoms.   Total time on the day of discharge 45 minutes      Corinna L. Lendell Caprice, MD  Electronically Signed     CLS/MEDQ  D:  03/07/2007  T:  03/07/2007  Job:  [id number]   cc:   Chales Salmon. Abigail Miyamoto, M.D.  Fax: [phone]   Evie Lacks, MD  Fax: [phone]

## 2010-12-12 NOTE — Consult Note (Signed)
[name], [name]               ACCOUNT NO.:  [id number]   MEDICAL RECORD NO.:  [id number]          PATIENT TYPE:  INP   LOCATION:  1410                         FACILITY:  [facility]   PHYSICIAN:  Genene Churn. Love, M.D.    DATE OF BIRTH:  1941/05/03   DATE OF CONSULTATION:  03/06/2007  DATE OF DISCHARGE:                                 CONSULTATION   ADDRESS:  [address], [place], [place], [address].   This 70 year old right-handed black married female is seen for  evaluation of drooling out of the left side of her mouth and headache.   HISTORY OF PRESENT ILLNESS:  [name] has a known prior history of  hyperlipidemia and gastroesophageal reflux disease.  She has been on  Lipitor and AcipHex but has not been taking her medications and is not  on aspirin.  She has a history of being seen by [facility] in July 31, 2005, for evaluation of leg pain without a  specific etiology found.  An MRI study of the lumbar spine showing  evidence of diffuse bulging disks and DJD at T12-L1 and L5-S1.  She has  a known history of headaches very often before 1997, but has had  recurrence over the last few months as often as two times per week,  usually occurring on one side of her head or the other, at times  associated with nausea and a sensation of like a knife being stuck in  the forehead.  These can last as long as 2 days.  She is uncertain if  these are migraine.  On February 27, 2007, she bent over, developed some  nausea and vomiting, and then felt some headache.  On March 03, 2007,  she went to work and on March 04, 2007, she had neck pain mostly on the  left side with swelling of the left side of her neck, developed slurred  speech and drooling the left side of her mouth.  As mentioned, she had  some left neck pain with it.  She also had right-sided headache.  She  was seen in the emergency room where evaluation included an MRI study of  the brain with and  without contrast enhancement showing minimal small-  vessel disease.  Intracranial MRA was normal except for question of a  left MCA outpouching.  Neck MRA showed question of calcific plaque in  the left ICA.  Homocystine and sed rate were normal.  A Doppler study of  the carotids was normal.  A 2-D echocardiogram is still pending.  A  cardiac panel, CMP, CBC were all normal except for elevated hemoglobin  of 15.9.  an EKG showed sinus bradycardia.  Chest x-ray was negative and  cervical spine showed spondylosis of C4-5, C5-6, and C6-7, worse at C5-6  to the left.  She had not been taking aspirin at home.  She denies any  other new neurologic symptoms.  Her EKG in the hospital has shown sinus  bradycardia.  Her 2-D echocardiogram is pending.   EXAMINATION:  Revealed a well-developed female.  Blood pressure right  and left arm 120/80, heart rate 68, no bruits.  Mental status:  Alert,  oriented x3.  Followed one-, two-, and three-step commands.  Cranial  nerve examination:  Full.  Disks flat.  Extraocular movements full.  Corneals present.  Facial sensation equal.  No facial motor asymmetry.  Hearing present.  Air conduction greater than bone conduction.  Tongue  midline.  Uvula midline.  Gags present.  Sternocleidomastoid and  trapezius testing normal.  Motor examination:  Good strength of the  upper and lower extremities.  Sensory examination:  Intact to pinprick,  touch, joint position, and vibration testing.  Deep tendon reflexes 2+.  Plantar response downgoing.  Gait within normal limits.   IMPRESSION:  1. Right brain transient ischemic attack, code 435.9.  In view of      recent headaches, consider migraine, code 346.10.  2. Hyperlipidemia, code 272.4.  3. Gastroesophageal reflux disease.   The plan at this time is to place her on verapamil 80 mg t.i.d. for the  potential of migraine, follow her in the hospital, get the 2-D  echocardiogram when available, ask her not to drive for  1 week, and  obtain RPR.  She should be on a statin and aspirin medication as well as  the verapamil.  I will be glad to follow her up as an outpatient.           ______________________________  Genene Churn. Sandria Manly, M.D.     JML/MEDQ  D:  03/06/2007  T:  03/06/2007  Job:  [id number]   cc:   Chales Salmon. Abigail Miyamoto, M.D.  Fax: [phone]

## 2010-12-15 NOTE — Procedures (Signed)
[facility]  Patient:    [name], [name] Visit Number: [id number] MRN: [id number]          Service Type: END Location: ENDO Attending Physician:  Orland Mustard Dictated by:   Llana Aliment. Randa Evens, M.D. Proc. Date: 10/07/01 Admit Date:  10/07/2001   CC:         Chales Salmon. Abigail Miyamoto, M.D.   Procedure Report  PROCEDURE:  Esophagogastroduodenoscopy.  MEDICATIONS:  Cetacaine spray, Fentanyl 100 mcg, Versed 7 mg IV.  INDICATIONS:  The patient has had a history of esophageal reflux symptoms with endoscopy at other location showing a reddened esophagus, had been on a PPI, presents to the office with heme positive stool.  DESCRIPTION OF PROCEDURE:  The procedure has been explained to the patient and consent obtained. With the patient in the left lateral decubitus position, the Olympus video endoscope was inserted blindly in the esophagus and advanced under direct visualization. The stomach was entered, pylorus identified and passed. Duodenum, including the bulb and second portion was seen well. The scope was withdrawn and the duodenum and second portion were normal. The pyloric channel was normal. The antrum and body of the stomach were normal. Fundus and cardia were seen well in the retroflex view and were normal. There was a hiatal hernia with a patent GE junction, slightly reddened esophagus, no evidence of Barretts esophagus. The proximal esophagus was normal. The patient tolerated the procedure well and was maintained on low flow oxygen and pulse oximeter throughout the procedure.  ASSESSMENT:  Hiatal hernia with gastroesophageal reflux disease.  PLAN:  Will continue on PPI and proceed with colonoscopy at this time. Dictated by:   Llana Aliment. Randa Evens, M.D. Attending Physician:  Orland Mustard DD:  10/07/01 TD:  10/08/01 Job: [id number]

## 2010-12-15 NOTE — H&P (Signed)
[name], [name]               ACCOUNT NO.:  [id number]   MEDICAL RECORD NO.:  [id number]          PATIENT TYPE:  OIB   LOCATION:  6740                         FACILITY:  [facility]   PHYSICIAN:  Armanda Magic, M.D.     DATE OF BIRTH:  01/28/1941   DATE OF ADMISSION:  06/11/2006  DATE OF DISCHARGE:  06/11/2006                              HISTORY & PHYSICAL   CARDIAC CATHETERIZATION DATE:  June 11, 2006   PRIMARY CARE PHYSICIAN:  Henrine Screws, MD.   CARDIOLOGIST:  Armanda Magic, MD   HISTORY OF PRESENT ILLNESS:  [name] is a 70 year old African-  American female with no known history of coronary artery disease.  She  does have a history of dyslipidemia and GERD.  The patient has  complained of atypical chest pain with a questionable etiology.  The  chest pain is intermittent, sharp, and located on the left side and  substernally with radiation to her left arm, right arm and right neck.  She was recently hospitalized this past week for the chest pain.  She  denies shortness of breath, nausea, vomiting, diaphoresis, or dizziness  as it relates to the chest pain.  She recently underwent a stress  Cardiolite, which was negative for inducible ischemia.  Despite the  normal Cardiolite, [name] continues to have chest pain so she is  being brought in today as an outpatient for diagnostic cardiac  catheterization at 3 p.m.  She is currently chest-pain-free.   PAST MEDICAL HISTORY:  1. Dyslipidemia.  2. GERD.   ALLERGIES:  PENICILLIN.   MEDICATIONS:  1. Pravachol 20 mg daily.  2. AcipHex 20 mg daily.   PAST SURGICAL HISTORY:  1. Status post polyps removed from vocal cords in 1984.  2. Status post 3 vaginal deliveries.   FAMILY HISTORY:  Insignificant for early coronary artery disease.   SOCIAL HISTORY:  Married with three adult children - all healthy.  Lives  with husband.  Currently does not work.  She denies tobacco, alcohol,  and illicit drug use.  She admits  to 16 to 24 ounces of caffeine daily  (coffee).   REVIEW OF SYSTEMS:  Positive for bilateral calf pain at rest and with  exertion, otherwise negative.   PHYSICAL EXAM:  GENERAL:  A 70 year old well-developed, well-nourished  female, pleasant and cooperative, NAD.  VITALS:  Temperature 97.6, pulse 63, respirations 18, blood pressure  108/65, O2 saturation 95% over room air, weight 180 pounds, height 5  feet 6-1/2 inches.  HEENT:  Unremarkable.  NECK:  Supple without JVD or carotid bruits, bilaterally.  Carotid  upstroke is 2+.  PULMONARY:  Breath sounds are equally clear to auscultation,  bilaterally.  No use of accessory muscles.  CV:  Regular rate and rhythm.  S1 and S2, normal.  No murmurs, gallops,  clicks, or rubs noted.  ABDOMEN:  Soft, nontender, nondistended with active bowel sounds.  No  masses, organomegaly, or bilateral bruits.  EXTREMITIES:  No peripheral edema.  DP and PT pulses 2+/2, bilaterally.  Femoral pulses 2+/2 bilaterally.  PSYCH:  Normal mood and affect.  NEURO:  No focal motor or sensory deficits.  SKIN:  Warm and dry without rashes or lesions.  BACK:  No kyphosis or scoliosis.   LABORATORY DATA:  White blood count 5.3, hemoglobin 14.3, hematocrit  43.6, platelets 219,000.  Sodium 139, potassium 4.0, chloride 102, CO2  of 30, BUN 6, creatinine 0.8, glucose 91.   Chest x-[name] dated, June 02, 2006, no acute abnormalities.   EKG dated November 16, 2005, sinus bradycardia at 50 beats/min.  No  evidence of ischemia.   ASSESSMENT AND PLAN:  1. Chest pain, atypical, resolved upon presentation to short stay      today.  2. Diagnostic cardiac catheterization with possible percutaneous      coronary intervention, today at 3 o'clock p.m.  Dr. Armanda Magic      has explained the procedure, risks, and potential complications,      including myocardial infarction, cerebrovascular, death, IV      contrast dye allergy, renal insufficiency within the first 48       hours, and vascular complications, including bleeding, ecchymosis,      hematoma, and pseudoaneurysm.  The patient understands the      information and wishes to proceed with the catheterization.      Tylene Fantasia, [place]      Armanda Magic, M.D.  Electronically Signed    RDM/MEDQ  D:  06/11/2006  T:  06/12/2006  Job:  [id number]   cc:   Chales Salmon. Abigail Miyamoto, M.D.

## 2010-12-15 NOTE — Op Note (Signed)
[facility]  Patient:    [name], [name]                      MRN: [id number] Proc. Date: 08/22/00 Adm. Date:  [id number] Disc. Date: [id number] Attending:  Osvaldo Human CC:         Duncan Dull, M.D.   Operative Report  PROCEDURE:  Cystoscopy, bladder biopsy with fulguration.  PREOPERATIVE DIAGNOSES:  Bladder lesion, rule out chronic cystitis versus carcinoma in situ.  POSTOPERATIVE DIAGNOSES:  Bladder lesion, rule out chronic cystitis versus carcinoma in situ.  SURGEON:  Dr. Bjorn Pippin.  ANESTHESIA:  General.  SPECIMEN:  Bladder biopsies.  COMPLICATIONS:  None.  INDICATIONS FOR PROCEDURE:  [name] is a 70 year old black female with recurrent UTIs who on office cystoscopy was found to have multiple raised lesions in the bladder suspicious for chronic follicular cystitis but carcinoma in situ could not be ruled out as there were some angrier erythematous areas as well. It was felt that cysto, biopsy and fulguration were indicated.  FINDINGS AND DESCRIPTION OF PROCEDURE:  The patient was taken to the operating room and she had received p.o. Tequin preoperatively. A general anesthetic was induced. She was placed in lithotomy position. Her perineum and genitalia were prepped with Betadine solution and she was draped in the usual sterile fashion. Cystoscopy was performed using a 22 [place] scope and the 12 and 70 degree lenses. Examination revealed a normal urethra. The ureteral orifices were in the normal anatomic position effluxing clear urine. The bladder wall was slightly erythematous with multiple raised pink tannish lesions approximately 3-4 mm across each that were very suspicious for follicular cystitis. Some of the more erythematous mucosa was not as prevalent as it had been at office cystoscopy. A cut biopsy forceps was used to obtain two biopsies from the trigone where the concentration of the lesion was as well as the  biopsy from the right lateral wall and left lateral wall. Once the biopsies had been obtained, the biopsy sites were fulgurated with Bovie electrode. The patients bladder was drained. She was taken down from lithotomy position. Her anesthetic was reversed and she was removed to the recovery room in stable condition. There were no complications. DD:  08/22/00 TD:  08/22/00 Job: [id number]

## 2010-12-15 NOTE — H&P (Signed)
[name], APFELBAUM               ACCOUNT NO.:  [id number]   MEDICAL RECORD NO.:  [id number]          PATIENT TYPE:  INP   LOCATION:  3705                         FACILITY:  [facility]   PHYSICIAN:  Jackie Plum, M.D.DATE OF BIRTH:  09/15/1940   DATE OF ADMISSION:  06/02/2006  DATE OF DISCHARGE:                                HISTORY & PHYSICAL   CHIEF COMPLAINT:  Chest pain.   HISTORY OF PRESENT ILLNESS:  The patient is a 70 year old African American  lady who moved to [place] about 6 years ago.  She has history of  dyslipidemia without history hypertension or diabetes mellitus.  She has had  a prior history of angina pains, but has not been told that she has heart  blockages.  She presented with about 30-45 minutes of sternal sharp pain  __________ .  There was no history of shortness of breath, nausea, vomiting  or diaphoresis.  She denies any history of cough, sputum production, fevers  or chills.  No history of dysuria or frequency of micturition.   PAST MEDICAL HISTORY:  As noted above.   ALLERGIES:  The patient has allergies to PENICILLIN.   MEDICATIONS:  She is on Pravachol and Aciphex, 40 mg and 20 mg daily,  respectively.   FAMILY HISTORY:  Positive for history of strokes.   SOCIAL HISTORY:  The patient does not smoke cigarettes nor drink alcohol.  She used to own a Patent attorney business in [place] and moved to [place]  about 6 years ago.   REVIEW OF SYSTEMS:  As stated above, otherwise unremarkable.   PHYSICAL EXAMINATION:  VITAL SIGNS:  BP 119/67, pulse 69, respirations 21,  temperature 98.0 degrees Fahrenheit.  O2 SAT 99%.  GENERAL:  Not in acute cardiopulmonary distress.  The patient was chest-pain-  free at this time.  HEENT:  Normocephalic, atraumatic.  Pupils were equal, round and reactive to  light.  Extraocular movements intact.  Oropharynx moist.  NECK:  Supple with no JVD.  LUNGS:  Clear to auscultation.  CARDIAC:  Regular rate and rhythm.  No  gallops or murmur.  ABDOMEN:  Soft and nontender.  Bowel sounds present.  EXTREMITIES:  No cyanosis.  CNS:  Exam was nonfocal.   RADIOLOGIC FINDINGS:  X-ray of the chest did not reveal any acute  infiltrate.   LABORATORY WORK:  Point-of-care cardiac markers were unremarkable.   Twelve-lead EKG showed sinus rhythm at 73 beats per minute with flipped T  waves in the anterolateral leads compared to previous EKG of June 2006.   Sodium was 141, potassium 3.6, chloride 110, BUN 8, glucose 130, bicarbonate  23.7.  Hemoglobin 15, hematocrit 44.  Point-of-care cardiac markers  negative.  Magnesium 2.3.   IMPRESSION:  Chest pain, to rule out coronary artery disease.  The patient  has been evaluated by Cardiology and has been continued on intravenous  heparin and nitroglycerin, though the patient indicated that she is chest-  pain-free at this time.  She cannot tell whether there was a temporal  relationship between remission of her chest pain, which is currently totally  relieved, with  onset of initiation of nitroglycerin.  We will make the  patient nothing-by-mouth after midnight for possible stress in the morning  if she rules out.  We will follow up on her serial cardiac enzymes.      Jackie Plum, M.D.  Electronically Signed     GO/MEDQ  D:  06/03/2006  T:  06/03/2006  Job:  [id number]

## 2010-12-15 NOTE — Cardiovascular Report (Signed)
NAMEBERNELDA, [name]               ACCOUNT NO.:  [id number]   MEDICAL RECORD NO.:  [id number]          PATIENT TYPE:  OIB   LOCATION:  6740                         FACILITY:  [facility]   PHYSICIAN:  Armanda Magic, M.D.     DATE OF BIRTH:  [birth year]   DATE OF PROCEDURE:  DATE OF DISCHARGE:  06/11/2006                            CARDIAC CATHETERIZATION   CARDIOLOGY PROCEDURE NOTE:   PROCEDURE:  Left heart catheterization, coronary angiography, left  ventriculography.   OPERATOR:  Armanda Magic, M.D.   INDICATIONS:  Chest pain.   COMPLICATIONS:  None.   IV ACCESS:  Via right femoral artery, 6-French sheath.   This is a very pleasant 70 year old female with a history of chest pain  who now presents for cardiac catheterization.   The patient was brought to the cardiac catheterization laboratory in the  fasting nonsedated state.  Informed consent was obtained.  The patient  was connected to continuous heart rate and pulse oximetry monitoring and  intermittent blood pressure monitoring.  The right groin was prepped and  draped in sterile fashion.  1% Xylocaine was used for local anesthesia.  Using the modified Seldinger technique, a 6-French sheath was placed in  the right femoral artery.  Under fluoroscopic guidance, a 6-French JL-4  catheter was placed in the left coronary artery.  Multiple cine films  were taken at 30 degree RAO and 40 degree LAO views.  This catheter was  then exchanged out over a guidewire for a 6-French JR-4 catheter which  was placed under fluoroscopic guidance to the right coronary artery.  Multiple cine films were taken at 30 degree RAO and 40 degree LAO views.  This catheter was then exchanged out over a guidewire for 6-French  angled pigtail catheter which was placed under fluoroscopic guidance to  the left ventricular cavity.  Left ventriculography was performed in a  30 degree RAO view using a total of 30 cc of contrast at 15 cc per  second.  The catheter  was then pulled back across the aortic valve with  no significant gradient noted.  At the end of the procedure, all  catheters and sheaths were removed.  Manual compression was performed  until adequate hemostasis was obtained.  The patient was transferred  back to the room in stable in stable condition.   RESULTS:  The left main coronary artery is widely patent and bifurcates  to the left anterior descending artery and left circumflex artery.  The  left anterior descending artery is widely patent throughout its course  with an eccentric 30-40% plaque in the proximal portion.  It gives rise  to a first diagonal branch which is widely patent.   The left circumflex is widely patent throughout its course in the AV  groove giving rise to a first obtuse marginal branch which is widely  patent, and then giving rise to a second obtuse marginal branch which is  widely patent.  It bifurcates into daughter branches, both of which are  widely patent.  It gives rise to a third obtuse marginal branch which is  widely patent and then  terminates in the posterior descending artery  which is widely patent.  This is a left dominant system.   The right coronary artery is nondominant and small and widely patent.   Left ventriculography shows normal LV systolic function, EF 60%, left  ventricular pressure 138/-3 mmHg, aortic pressure 146/77 mmHg, LVEDP 7  mmHg.   ASSESSMENT:  1. Noncardiac chest pain.  2. Normal coronary arteries.  3. Normal left ventricular function.   PLAN:  1. Discharge home after IV fluid and bedrest.  2. Follow up with Dr. Abigail Miyamoto for further workup of noncardiac chest      pain.      Armanda Magic, M.D.  Electronically Signed     TT/MEDQ  D:  06/17/2006  T:  06/17/2006  Job:  [id number]   cc:   Chales Salmon. Abigail Miyamoto, M.D.

## 2010-12-15 NOTE — H&P (Signed)
[name], [name]                           ACCOUNT NO.:  [id number]   MEDICAL RECORD NO.:  [id number]                   PATIENT TYPE:  EMS   LOCATION:  MINO                                 FACILITY:  [facility]   PHYSICIAN:  Quita Skye. Waldon Reining, MD             DATE OF BIRTH:  [birth year]   DATE OF ADMISSION:  04/27/2003  DATE OF DISCHARGE:                                HISTORY & PHYSICAL   [name] is a 70 year old black woman who is admitted to [facility] for further evaluation of chest pain.   The patient, who has no past history of cardiac disease, presented to the  emergency department with chest pain.  This began late this morning while  she was sitting.  Chest pain was described as a substernal chest pressure.  It radiated to the right neck and down the left arm.  It was associated with  dyspnea, diaphoresis, and nausea,  There were no exacerbating or  ameliorating factors.  It appeared not to be related to position, activity,  meals, or respirations.  It continued throughout the course of the day,  waxing and waning in intensity, and ultimately prompted her visit to the  emergency department.  Her chest pain is largely resolved at this time.   As noted, the patient has no past history of cardiac disease, including no  history of chest pain, myocardial infarction, coronary artery disease,  congestive heart failure or arrhythmia.  She has a history of hyperlipidemia  and previously had been on medications but these were discontinued because  of some muscle complaints.  There is no history of hypertension, diabetes  mellitus, smoking, or family history of early coronary artery disease.   The patient has no other medical problems.   She does not take any medications.   ALLERGIES:  She is reportedly allergic to PENICILLIN.   SOCIAL HISTORY:  The patient does not work.  She lives with her husband.  Her children are grown and out of the house.   FAMILY HISTORY:   There is no family history of coronary artery disease,  hypertension, or diabetes mellitus.   PREVIOUS OPERATIONS:  Removal of a vocal cord polyp, and a tubal ligation.   SIGNIFICANT INJURIES:  None.   REVIEW OF SYSTEMS:  Reveals no problems related to her head, eyes, ears,  nose, mouth, throat, lungs, gastrointestinal system, genitourinary system,  or extremities.  There is no history of neurologic or psychiatric disorder.  There is no history of fever, chills, or weight loss.   PHYSICAL EXAMINATION:  VITAL SIGNS: Blood pressure 133/99.  Pulse 68 and  regular.  Respirations 22.  Temperature 98.4.  GENERAL: The patient was a middle-aged black woman in no discomfort. She is  alert, oriented, appropriate, and responsive.  HEENT:  Head, eyes, nose, and mouth were normal.  NECK: Without thyromegaly or adenopathy.  Carotid pulses were palpable  bilaterally  and without bruits.  CARDIAC: Examination revealed a normal S1 and S2.  There was no S3, S4,  murmur, rub, or click.  Cardiac rhythm was regular.  No chest wall  tenderness was noted.  LUNGS: Clear.  ABDOMEN: Soft and nontender.  There is no mass, hepatosplenomegaly, or  bruit, distention, or rebound, guarding, or rigidity.  Bowel sounds were  normal.  BREASTS, PELVIC, AND RECTAL EXAMINATIONS: Were not performed as they were  not pertinet to the reason for acute care hospitalization.  EXTREMITIES: Were without edema, deviation, or deformity.  Radial and  dorsalis pedis pulses were palpable bilaterally.  BRIEF SCREENING NEUROLOGIC SURVEY: Was unremarkable.   Electrocardiogram revealed normal sinus rhythm. There was mild T wave  inversion in leads V2 and V3 with T wave flattening elsewhere.  The chest  radiograph was pending at the time of this dictation.  Initial CK-MB was 3.8  with a myoglobin of 111 and troponin of less than 0.05.  Potassium was 3.5,  BUN 10, and creatinine 1.0.  White count was 5.1 with a hemoglobin of 14.9  and  hematocrit of 44.9.  The remaining studies were pending at the time of  this dictation.   IMPRESSION:  1. Chest pain, rule out coronary artery disease.  The chest pain is     described as a substernal pressure radiating to the right neck and left     arm.  It has waxed and waned throughout the course of the day.  It is not     related to activity.  There was associated dyspnea, diaphoresis, and     nausea.   1. Hyperlipidemia.   PLAN:  1. Telemetry.  2. Serial cardiac enzymes.  3. Aspirin.  4. Nitrol paste.  5. Lovenox.  6. Fasting lipid profile.  7. Further evaluation per Dr. Mayford Knife.   This patient encounter was chaperoned by S. Ferguson.  Wanita Chamberlain. Waldon Reining, MD    MSC/MEDQ  D:  04/28/2003  T:  04/28/2003  Job:  [id number]   cc:   Armanda Magic, M.D.  [address], [address]  [place], [place] [address]  Fax: [phone]

## 2010-12-15 NOTE — Procedures (Signed)
[facility]  Patient:    [name], [name] Visit Number: [id number] MRN: [id number]          Service Type: END Location: ENDO Attending Physician:  Orland Mustard Dictated by:   Llana Aliment. Randa Evens, M.D. Proc. Date: 10/07/01 Admit Date:  10/07/2001   CC:         Chales Salmon. Abigail Miyamoto, M.D.   Procedure Report  PROCEDURE:  Colonoscopy.  MEDICATIONS:  Patient received a total of Fentanyl 112 mcg, Versed 10 mg for both procedures.  SCOPE:  Olympus Pediatric video colonoscope.  INDICATION:  Patient moved here from another location and had history of adenomatous polyps removed and needed followup colonoscopy. She presented to the office with increasing constipation, heme positive stool.  DESCRIPTION OF PROCEDURE:  The procedure has been explained to the patient and consent obtained. With the patient in left lateral decubitus position, the Olympus Pediatric video colonoscope was inserted and advanced under direct visualization. The patient had a tortuous colon. We were able to reach the cecum without difficulty where the ileocecal valve and appendiceal orifice were seen. The scope was withdrawn and the cecum, ascending colon, hepatic flexure, transverse colon, splenic flexure, descending, and sigmoid colon were seen well. No polyps were seen. Internal hemorrhoids were seen in rectum upon removal of the scope. The scope was withdrawn. The patient tolerated the procedure well.  ASSESSMENT: 1. Heme positive stool. 2. History of previous colon polyps. 3. Internal hemorrhoids.  PLAN:  Will see back in the office in two months. Continue on MiraLax for constipation. Repeat procedure in three years. Dictated by:   Llana Aliment. Randa Evens, M.D. Attending Physician:  Orland Mustard DD:  10/07/01 TD:  10/08/01 Job: [id number]

## 2010-12-15 NOTE — H&P (Signed)
NAME:  TAHISHA, WEIDOW NO.:  [id number]   MEDICAL RECORD NO.:  [id number]          PATIENT TYPE:  EMS   LOCATION:  MAJO                         FACILITY:  [facility]   PHYSICIAN:  Ulyses Amor, MD DATE OF BIRTH:  1941/03/07   DATE OF ADMISSION:  06/02/2006  DATE OF DISCHARGE:                                HISTORY & PHYSICAL   [name] is a 70 year old black woman who is admitted to [facility] for further evaluation of chest pain.   The patient, who has no documented past history of cardiac disease,  presented to the emergency department after experiencing an episode of chest  pain.  She was at church when she turned her head to hear a speaker.  She  suddenly experienced the onset of chest pain.  This was described as an  initial sharp discomfort, followed by a pressure.  It was associated with  nausea but no dyspnea or diaphoresis.  There were no exacerbating or  ameliorating factors.  It appeared not to be related to meals or  respirations.  There were no radiation of the pain.  It resolved  spontaneously.  The total duration of chest pain was approximately 30  minutes.  There has been no recurrence of chest pain.   As noted above, the patient is no history of documented cardiac disease.  She says that she has occasional anginal pain but it is not clear what she  means by this who told she has anginal pains.  In any case, she has never  undergone any evaluation.  She does not take any nitroglycerin for her  symptoms.   There is no history of congestive heart failure or arrhythmia.   Her only risk factor for coronary artery disease is dyslipidemia.  There is  no history of diabetes mellitus, hypertension, smoking or family history of  coronary artery disease.   The patient's only other medical problems is gastroesophageal reflux.  Today's discomfort was distinctly different from her usual reflux  discomfort.   MEDICATIONS:  Pravastatin and  AcipHex.   ALLERGIES:  PENICILLIN.   OPERATIONS:  1. Removal of a vocal cord nodule.  2. Tubal ligation.   SOCIAL HISTORY:  The patient is married and lives with her husband.  Her  three children are grown and no longer living at home.  She does not smoke  cigarettes.  She does not drink alcohol.  She is a retired Designer, industrial/product.   FAMILY HISTORY:  Her father died of tuberculosis.  Her mother died of a  stroke.  Out of 11 siblings, 3 are deceased.  They died of lung cancer,  pneumonia and in childbirth.   REVIEW OF SYSTEMS:  Reveals no problems related to her head, eyes, ears,  nose, mouth, throat, lungs, gastrointestinal system, genitourinary system or  extremities.  There is no history of neurologic or psychiatric disorder.  There is no history of fever, chills or weight loss.   PHYSICAL EXAMINATION:  Blood pressure 116/68, pulse 72 and regular,  respirations 18, temperature 98.0.  The patient was an older black woman  in  no discomfort.  She was alert, oriented, appropriate and responsive.  Head,  eyes, nose, mouth and throat were normal.  The neck was without thyromegaly  or adenopathy.  Carotid pulses were palpable bilaterally and without bruits.  Cardiac examination reveals a normal S1-S2.  There was no S3, S4, murmur,  rub or click.  Cardiac rhythm was regular.  No chest wall tenderness was  noted.  The lungs were clear.  The abdomen was soft and nontender.  There  was no mass, hepatosplenomegaly, bruit, distention, rebound, guarding or  rigidity.  Bowel sounds were normal.  Breasts, pelvic and rectal  examinations were not performed as they were not pertinent to the reason for  acute care hospitalization.  The extremities were without edema, deviation  or deformity.  Radial and dorsalis pedis pulses were palpable bilaterally.  Brief screening neurologic survey was unremarkable.   Electrocardiogram revealed sinus bradycardia at 68 beats per minute.  There  was T-wave inversion in  leads V2 and V3 with T-wave flattening in I, aVL,  V4, and V5.  The chest radiograph, according to the radiologist,  demonstrated no evidence of acute cardiopulmonary disease.  The initial set  of cardiac markers revealed a myoglobin of 88.0, CK-MB 2.9 and troponin of  less than 0.05.  The second set of cardiac markers revealed a myoglobin of  79.3, CK-MB 3.2 and troponin less than 0.05.  The potassium is 3.6, BUN 8  and creatinine 0.9.  The remaining studies were pending at the time of this  dictation.   IMPRESSION:  1. Chest pain, rule out coronary artery disease:  One 30 minute episode of      a sharp and pressure discomfort in the front of her chest precipitated      by the turning of her head.  T-wave inversion noted in V2 and V3.  She      says she has occasional anginal pain but there has never been any      documentation of coronary artery disease.  It is not even clear what      she means by anginal pain as she reports no history of chest pain.  2. Dyslipidemia.  3. Gastroesophageal reflux.   RECOMMENDATIONS:  1. Telemetry.  2. Serial cardiac enzymes.  3. Aspirin.  4. Intravenous heparin.  5. Intravenous nitroglycerin.  6. Fasting lipid profile.  7. Further measures per Dr. Mayford Knife.      Ulyses Amor, MD  Electronically Signed     MSC/MEDQ  D:  06/02/2006  T:  06/03/2006  Job:  [id number]   cc:   Armanda Magic, M.D.

## 2010-12-15 NOTE — Discharge Summary (Signed)
[name], [name]                           ACCOUNT NO.:  [id number]   MEDICAL RECORD NO.:  [id number]                   PATIENT TYPE:  OBV   LOCATION:  4715                                 FACILITY:  [facility]   PHYSICIAN:  Armanda Magic, M.D.                  DATE OF BIRTH:  Sep 13, 1940   DATE OF ADMISSION:  04/27/2003  DATE OF DISCHARGE:  04/29/2003                                 DISCHARGE SUMMARY   CHIEF COMPLAINT:  Chest pain.   ADMISSION DIAGNOSES:  1. Rule out myocardial infarction.  2. Hyperlipidemia.   DISCHARGE DIAGNOSES:  1. Noncardiac chest pain, negative for myocardial infarction.  2. Hyperlipidemia.  3. Right middle lobe nodule.   HISTORY OF PRESENT ILLNESS:  Please see complete history and physical for  details, but in short, this is a 70 year old female with a sole history of  hyperlipidemia.  She presented to the emergency room on April 28, 2003  with the complaint of left anterior chest discomfort.  She was admitted to  rule out MI.   HOSPITAL COURSE:  Serial cardiac enzymes were all negative.  Chest x-ray  showed no active disease.  EKG showed normal sinus rhythm with no ST or T-  wave changes.   She underwent an Adenosine Cardiolite study on April 28, 2003 which was  negative for ischemia and showed normal LV function with an EF of 71%.  She  underwent a chest CT scan to rule out pulmonary embolus.  PE was ruled out.  However, there was a noted 6 mm right middle lobe nodule that appeared  benign per radiology.   She had no further chest pain for the remainder of her admission.  Her vital  signs remained stable.  She remained in a sinus rhythm.   Lipid profile showed a total of 274, triglycerides 154, HDL 44, LDL 199.  She states she has had some problems with myalgias in the past and this has  been questionable to Statin therapy.  She is currently on low dose Lipitor  and this is followed by Chales Salmon. Abigail Miyamoto, M.D.   The patient was discharged  home on April 29, 2003 without incident.   DISCHARGE MEDICATIONS:  Lipitor 20 mg daily.   ACTIVITY:  As tolerated.   DIET:  She is to maintain a low salt, low fat, low cholesterol diet.    FOLLOWUP:  She is to see Armanda Magic, M.D. for follow-up on a p.r.n. basis.  She will need to follow up with Chales Salmon. Abigail Miyamoto, M.D. for management of  her cholesterol.  An appointment was established for her to see Marcelyn Bruins, M.D. [facility] for follow-up of this pulmonary nodule.  The appointment is  Wednesday, October 13 at 3:30 p.m.  She is to bring her chest x-ray and copy  of her CT scan.      Adrian Saran, [place].P.  Armanda Magic, M.D.    HB/MEDQ  D:  04/29/2003  T:  04/29/2003  Job:  [id number]   cc:   Chales Salmon. Abigail Miyamoto, M.D.  [address]  [place]  [place] [address]  Fax: [phone]   Marcelyn Bruins, M.D. [facility]

## 2010-12-15 NOTE — Op Note (Signed)
NAMECHANTIA, [name]               ACCOUNT NO.:  [id number]   MEDICAL RECORD NO.:  [id number]          PATIENT TYPE:  AMB   LOCATION:  ENDO                         FACILITY:  [facility]   PHYSICIAN:  James L. Malon Kindle., M.D.DATE OF BIRTH:  [birth year]   DATE OF PROCEDURE:  06/08/2004  DATE OF DISCHARGE:                                 OPERATIVE REPORT   PROCEDURE:  Colonoscopy and polypectomy.   MEDICATIONS:  Fentanyl 100 mcg, Versed 10 mg IV.   SCOPE:  Olympus pediatric colonoscope.   INDICATIONS FOR PROCEDURE:  The patient has had worsening constipation,  abdominal pain, has had a previous history of colon polyps and is due to  have another colonoscopy so we went ahead and arranged another colonoscopy.   DESCRIPTION OF PROCEDURE:  The procedure had been explained to the patient  and consent obtained.  The scope was inserted and advanced.  The prep was  excellent.  The patient had fairly marked diverticular disease. We were able  to advance easily to the cecum, the ileocecal valve and appendiceal orifice  seen.  The scope was withdrawn and the cecum, ascending colon, hepatic  flexure, transverse colon, splenic flexure, descending and sigmoid colon  were seen well and were unremarkable.  The scope was withdrawn.  Marked  diverticular disease sigmoid colon.  In the rectosigmoid area at  approximately 15-20 cm, a 1 cm sessile polyp was removed with a snare tucked  up against the scope and removed. There was no significant bleeding. The  scope was withdrawn.  The patient tolerated the procedure well.   ASSESSMENT:  1.  Rectal polyp removed, 211.3.  2.  Diverticulosis, 562.10.  3.  History of colon polyps, V12.72.   PLAN:  Will continue on the Crystallose, routine post polypectomy  instruments, will check path and recommend repeating in three years.     JLE/MEDQ  D:  06/08/2004  T:  06/08/2004  Job:  [id number]   cc:   Chales Salmon. Abigail Miyamoto, M.D.  [address]  [place]  [place] [address]  Fax: [phone]

## 2011-01-14 ENCOUNTER — Encounter: Payer: Self-pay | Admitting: Internal Medicine

## 2011-01-14 DIAGNOSIS — Z0000 Encounter for general adult medical examination without abnormal findings: Secondary | ICD-10-CM | POA: Insufficient documentation

## 2011-01-14 DIAGNOSIS — R7302 Impaired glucose tolerance (oral): Secondary | ICD-10-CM

## 2011-01-14 HISTORY — DX: Encounter for general adult medical examination without abnormal findings: Z00.00

## 2011-01-14 HISTORY — DX: Impaired glucose tolerance (oral): R73.02

## 2011-01-15 ENCOUNTER — Ambulatory Visit (INDEPENDENT_AMBULATORY_CARE_PROVIDER_SITE_OTHER): Payer: Medicare Other | Admitting: Internal Medicine

## 2011-01-15 ENCOUNTER — Encounter: Payer: Self-pay | Admitting: Internal Medicine

## 2011-01-15 VITALS — BP 96/72 | HR 69 | Temp 98.7°F | Ht 66.0 in | Wt 176.5 lb

## 2011-01-15 DIAGNOSIS — R51 Headache: Secondary | ICD-10-CM | POA: Insufficient documentation

## 2011-01-15 DIAGNOSIS — H6980 Other specified disorders of Eustachian tube, unspecified ear: Secondary | ICD-10-CM

## 2011-01-15 DIAGNOSIS — Z0000 Encounter for general adult medical examination without abnormal findings: Secondary | ICD-10-CM

## 2011-01-15 DIAGNOSIS — J019 Acute sinusitis, unspecified: Secondary | ICD-10-CM

## 2011-01-15 DIAGNOSIS — I739 Peripheral vascular disease, unspecified: Secondary | ICD-10-CM

## 2011-01-15 DIAGNOSIS — F411 Generalized anxiety disorder: Secondary | ICD-10-CM

## 2011-01-15 DIAGNOSIS — R7309 Other abnormal glucose: Secondary | ICD-10-CM

## 2011-01-15 DIAGNOSIS — H6990 Unspecified Eustachian tube disorder, unspecified ear: Secondary | ICD-10-CM | POA: Insufficient documentation

## 2011-01-15 DIAGNOSIS — R7302 Impaired glucose tolerance (oral): Secondary | ICD-10-CM

## 2011-01-15 DIAGNOSIS — R519 Headache, unspecified: Secondary | ICD-10-CM | POA: Insufficient documentation

## 2011-01-15 HISTORY — DX: Other specified disorders of Eustachian tube, unspecified ear: H69.80

## 2011-01-15 HISTORY — DX: Acute sinusitis, unspecified: J01.90

## 2011-01-15 HISTORY — DX: Unspecified eustachian tube disorder, unspecified ear: H69.90

## 2011-01-15 MED ORDER — CLOPIDOGREL BISULFATE 75 MG PO TABS: 75.0000 mg | ORAL_TABLET | Freq: Every day | ORAL | Status: DC

## 2011-01-15 MED ORDER — AZITHROMYCIN 250 MG PO TABS: ORAL_TABLET | ORAL | Status: AC

## 2011-01-15 NOTE — Patient Instructions (Addendum)
Take all new medications as prescribed - the antibiotic You can also take Mucinex (or it's generic off brand) for congestion and the dizziness feeling Please call if you wish to be referred to the vein clinic Please take miralax daily for the constipation You are given the discount coupon to try for the crestor, and the samples today Please return in Oct 2012 as planned, with Lab testing done 3-5 days before\

## 2011-01-21 ENCOUNTER — Encounter: Payer: Self-pay | Admitting: Internal Medicine

## 2011-01-21 NOTE — Assessment & Plan Note (Signed)
stable overall by hx and exam, most recent data reviewed with pt, and pt to continue medical treatment as before  Lab Results  Component Value Date   WBC 4.7 09/12/2010   HGB 14.2 09/12/2010   HCT 43.4 09/12/2010   PLT 161 09/12/2010   CHOL  Value: 179        ATP III CLASSIFICATION:  <200     mg/dL   Desirable  045-409  mg/dL   Borderline High  >=811    mg/dL   High        04/12/7828   TRIG 133 09/12/2010   HDL 48 09/12/2010   LDLDIRECT 194.5 05/25/2010   ALT 22 09/12/2010   AST 26 09/12/2010   NA 141 09/12/2010   K 3.7 09/12/2010   CL 105 09/12/2010   CREATININE 0.89 09/12/2010   BUN 9 09/12/2010   CO2 28 09/12/2010   TSH 1.271 09/12/2010   INR 1.07 11/01/2009   HGBA1C  Value: 6.0 (NOTE)                                                                       According to the ADA Clinical Practice Recommendations for 2011, when HbA1c is used as a screening test:   >=6.5%   Diagnostic of Diabetes Mellitus           (if abnormal result  is confirmed)  5.7-6.4%   Increased risk of developing Diabetes Mellitus  References:Diagnosis and Classification of Diabetes Mellitus,Diabetes Care,2011,34(Suppl 1):S62-S69 and Standards of Medical Care in         Diabetes - 2011,Diabetes Care,2011,34  (Suppl 1):S11-S61.* 09/12/2010

## 2011-01-21 NOTE — Assessment & Plan Note (Signed)
Mild to mod, for antibx course,  to f/u any worsening symptoms or concerns 

## 2011-01-21 NOTE — Progress Notes (Signed)
Subjective:    Patient ID: [name], female    DOB: Jun 19, 1941, 70 y.o.   MRN: [id number]  HPI   Here with 3 days acute onset fever, facial pain, pressure, general weakness and malaise, and greenish d/c, with slight ST, but little to no cough and Pt denies chest pain, increased sob or doe, wheezing, orthopnea, PND, increased LE swelling, palpitations, dizziness or syncope. Also with left ear popping, crackling, but without hearing loss, vertigo, n/v, falls, though has has felt at times off balance.  Pt denies new neurological symptoms such as new headache, or facial or extremity weakness or numbness - did have mild HA at start, then resolved x 2 days. Does also meniton pain and discomofort ongoing at the RLE saphenous harvest site, but no cluadication.  Also with chronic constipation with bloating, mid abd discomfort crampy off and on, radiation to the back on the right, but no vomiting, blood.  No clear RLE radicular symptoms   Denies worsening depressive symptoms, suicidal ideation, or panic, though has ongoing anxiety, not increased recently.  No polydipsia, polyuria. Past Medical History  Diagnosis Date  . Impaired glucose tolerance 01/14/2011   No past surgical history on file.  reports that she has never smoked. She does not have any smokeless tobacco history on file. Her alcohol and drug histories not on file. family history is not on file. Allergies  Allergen Reactions  . Atorvastatin     REACTION: myalgia  . Penicillins    Current Outpatient Prescriptions on File Prior to Visit  Medication Sig Dispense Refill  . ALPRAZolam (XANAX) 0.25 MG tablet TAKE ONE TABLET BY MOUTH TWICE DAILY AS NEEDED  60 tablet  1  . CRESTOR 20 MG tablet TAKE ONE TABLET BY MOUTH EVERY DAY  90 each  3   Review of Systems Review of Systems  Constitutional: Negative for diaphoresis and unexpected weight change.  HENT: Negative for drooling and tinnitus.   Eyes: Negative for photophobia and visual  disturbance.  Respiratory: Negative for choking and stridor.   Gastrointestinal: Negative for vomiting and blood in stool.  Genitourinary: Negative for hematuria and decreased urine volume.    Objective:   Physical Exam BP 96/72  Pulse 69  Temp(Src) 98.7 F (37.1 C) (Oral)  Ht 5\' 6"  (1.676 m)  Wt 176 lb 8 oz (80.06 kg)  BMI 28.49 kg/m2  SpO2 96% Physical Exam  VS noted, mild ill  Constitutional: Pt appears well-developed and well-nourished.  HENT: Head: Normocephalic.  Right Ear: External ear normal.  Left Ear: External ear normal.  Bilat tm's mild erythema.  Sinus tender bilat .  Pharynx mild erythema Eyes: Conjunctivae and EOM are normal. Pupils are equal, round, and reactive to light.  Neck: Normal range of motion. Neck supple.  Cardiovascular: Normal rate and regular rhythm.   Pulmonary/Chest: Effort normal and breath sounds normal.  Abd:  Soft, NT, non-distended, + BS Neurological: Pt is alert. No cranial nerve deficit.  Skin: Skin is warm. No erythema.  Psychiatric: Pt behavior is normal. Thought content normal.         Assessment & Plan:

## 2011-01-21 NOTE — Assessment & Plan Note (Signed)
For OTC mucinex prn ,  to f/u any worsening symptoms or concerns

## 2011-01-21 NOTE — Assessment & Plan Note (Signed)
With ? Saphenus vein harvest site discomfort; exam o/w bening, consider vein clinic

## 2011-01-30 ENCOUNTER — Other Ambulatory Visit: Payer: Self-pay | Admitting: Internal Medicine

## 2011-03-29 ENCOUNTER — Other Ambulatory Visit: Payer: Self-pay | Admitting: Internal Medicine

## 2011-04-09 ENCOUNTER — Other Ambulatory Visit: Payer: Self-pay | Admitting: Internal Medicine

## 2011-04-09 NOTE — Telephone Encounter (Signed)
Faxed hardcopy to pharmacy. 

## 2011-04-10 ENCOUNTER — Ambulatory Visit: Payer: Medicare Other | Admitting: Internal Medicine

## 2011-04-11 ENCOUNTER — Encounter: Payer: Self-pay | Admitting: Internal Medicine

## 2011-04-11 ENCOUNTER — Ambulatory Visit (INDEPENDENT_AMBULATORY_CARE_PROVIDER_SITE_OTHER): Payer: Medicare Other | Admitting: Internal Medicine

## 2011-04-11 VITALS — BP 100/66 | HR 63 | Temp 98.4°F | Ht 66.5 in | Wt 178.0 lb

## 2011-04-11 DIAGNOSIS — M545 Low back pain, unspecified: Secondary | ICD-10-CM

## 2011-04-11 DIAGNOSIS — R21 Rash and other nonspecific skin eruption: Secondary | ICD-10-CM | POA: Insufficient documentation

## 2011-04-11 DIAGNOSIS — R7302 Impaired glucose tolerance (oral): Secondary | ICD-10-CM

## 2011-04-11 DIAGNOSIS — R7309 Other abnormal glucose: Secondary | ICD-10-CM

## 2011-04-11 MED ORDER — GABAPENTIN 600 MG PO TABS: 600.0000 mg | ORAL_TABLET | Freq: Three times a day (TID) | ORAL | Status: DC | PRN

## 2011-04-11 MED ORDER — TRIAMCINOLONE ACETONIDE 0.1 % EX CREA: TOPICAL_CREAM | Freq: Two times a day (BID) | CUTANEOUS | Status: AC

## 2011-04-11 MED ORDER — PREDNISONE 10 MG PO TABS: ORAL_TABLET | ORAL | Status: DC

## 2011-04-11 NOTE — Assessment & Plan Note (Signed)
Right distal medial leg - ? Contact derm like vs insect - for triam cr prn asd , to f/u any worsening symptoms or concerns

## 2011-04-11 NOTE — Patient Instructions (Signed)
Take all new medications as prescribed - the steroid cream, and the gabapentin, and the prednisone Please call in 3 wks if the pain persists, to consider the MRI for the lower back Continue all other medications as before Please return in 1 mo with Lab testing done 3-5 days before

## 2011-04-14 ENCOUNTER — Encounter: Payer: Self-pay | Admitting: Internal Medicine

## 2011-04-14 NOTE — Assessment & Plan Note (Signed)
stable overall by hx and exam, most recent data reviewed with pt, and pt to continue medical treatment as before  Lab Results  Component Value Date   HGBA1C  Value: 6.0 (NOTE)                                                                       According to the ADA Clinical Practice Recommendations for 2011, when HbA1c is used as a screening test:   >=6.5%   Diagnostic of Diabetes Mellitus           (if abnormal result  is confirmed)  5.7-6.4%   Increased risk of developing Diabetes Mellitus  References:Diagnosis and Classification of Diabetes Mellitus,Diabetes Care,2011,34(Suppl 1):S62-S69 and Standards of Medical Care in         Diabetes - 2011,Diabetes Care,2011,34  (Suppl 1):S11-S61.* 09/12/2010   Pt to call for onset polys or cbg > 200 on prednisone

## 2011-04-14 NOTE — Progress Notes (Signed)
Subjective:    Patient ID: [name], female    DOB: 1941-06-30, 70 y.o.   MRN: [id number]  HPI  Here with 3 days flare of pain to right lower back -  no bowel or bladder change, fever, wt loss,  But with worsening LE pain/numbness but no weakness, gait change or falls.  Has had itchy rash to right distal medial leg above the ankle with redness area somewhat weepy , and was wondering if it had something to do with the pain to the lower back and leg.  Pt denies chest pain, increased sob or doe, wheezing, orthopnea, PND, increased LE swelling, palpitations, dizziness or syncope.  Pt denies new neurological symptoms such as new headache, or facial or extremity weakness or numbness   Pt denies polydipsia, polyuria,  Pt states overall good compliance with meds, trying to follow lower cholesterol diet, wt overall stable but little exercise however.    Past Medical History  Diagnosis Date  . Impaired glucose tolerance 01/14/2011  . Acute sinus infection 01/15/2011  . ANXIETY 10/03/2009  . BARRETTS ESOPHAGUS 10/03/2009  . CAROTID ARTERY DISEASE 08/31/2009  . COLONIC POLYPS, HX OF 08/29/2009  . CONSTIPATION, CHRONIC 10/03/2009  . DEPRESSION 10/03/2009  . DISC DISEASE, CERVICAL 08/29/2009  . DISC DISEASE, LUMBAR 08/29/2009  . Eustachian tube dysfunction 01/15/2011  . GERD 08/29/2009  . HYPERLIPIDEMIA 08/29/2009  . LEG PAIN, BILATERAL 08/29/2009  . LOW BACK PAIN, CHRONIC 11/22/2009  . PERIPHERAL NEUROPATHY 10/03/2009  . PERIPHERAL VASCULAR DISEASE 08/29/2009  . POSITIVE PPD 10/03/2009  . Preventative health care 01/14/2011  . TIA 11/22/2009  . TRANSIENT ISCHEMIC ATTACK, HX OF 08/29/2009  . URETHRAL STRICTURE 10/03/2009  . VOCAL CORD POLYP, HX OF 10/03/2009  . WRIST PAIN, BILATERAL 05/25/2010   Past Surgical History  Procedure Date  . S/p vocal cord polyps 1984    chronic hoarseness  . Tubal ligation   . Stress test negative 2003,2004,2006  . Hx of right ankle fracture     reports that she has never smoked. She  does not have any smokeless tobacco history on file. She reports that she does not drink alcohol or use illicit drugs. family history includes Cancer in her other; Diabetes in her mother; Heart disease in her mother; Hypertension in her mother; Multiple myeloma in her sister; and Stroke in her mother. Allergies  Allergen Reactions  . Atorvastatin     REACTION: myalgia  . Penicillins    Current Outpatient Prescriptions on File Prior to Visit  Medication Sig Dispense Refill  . aspirin 325 MG tablet Take 325 mg by mouth daily.        . CRESTOR 20 MG tablet TAKE ONE TABLET BY MOUTH EVERY DAY  90 each  3  . Multiple Vitamin (MULTIVITAMIN) capsule Take 1 capsule by mouth daily.        Marland Kitchen omeprazole (PRILOSEC) 20 MG capsule TAKE TWO CAPSULES BY MOUTH EVERY DAY  180 capsule  3  . PLAVIX 75 MG tablet TAKE ONE TABLET BY MOUTH EVERY DAY  90 each  1   Review of Systems Review of Systems  Constitutional: Negative for diaphoresis and unexpected weight change.  HENT: Negative for drooling and tinnitus.   Eyes: Negative for photophobia and visual disturbance.  Respiratory: Negative for choking and stridor.   Gastrointestinal: Negative for vomiting and blood in stool.  Genitourinary: Negative for hematuria and decreased urine volume.     Objective:   Physical Exam BP 100/66  Pulse 63  Temp(Src) 98.4 F (36.9 C) (Oral)  Ht 5' 6.5" (1.689 m)  Wt 178 lb (80.74 kg)  BMI 28.30 kg/m2  SpO2 97% Physical Exam  VS noted Constitutional: Pt appears well-developed and well-nourished.  HENT: Head: Normocephalic.  Right Ear: External ear normal.  Left Ear: External ear normal.  Eyes: Conjunctivae and EOM are normal. Pupils are equal, round, and reactive to light.  Neck: Normal range of motion. Neck supple.  Cardiovascular: Normal rate and regular rhythm.   Pulmonary/Chest: Effort normal and breath sounds normal.  Abd:  Soft, NT, non-distended, + BS Neurological: Pt is alert. No cranial nerve deficit.  motor/sens/dtr intact, gait intact Skin: Skin is warm. No erythema. except for 2 cm area right distal leg above the ankle erythema, nontender, nonvesicular, somewhat weepy Psychiatric: Pt behavior is normal. Thought content normal.  Spine nontender, but does have some right lumbar paravertebral and mid buttock tender, no rash    Assessment & Plan:

## 2011-04-14 NOTE — Assessment & Plan Note (Addendum)
C/w flare acute on chronic mild recurring sciatica/radiculitis though some better with "leftover" gabapentin she has had from prior tx;  For predpack asd, pain control and cont gabapentin

## 2011-04-17 ENCOUNTER — Other Ambulatory Visit: Payer: Self-pay | Admitting: Internal Medicine

## 2011-04-17 DIAGNOSIS — Z1231 Encounter for screening mammogram for malignant neoplasm of breast: Secondary | ICD-10-CM

## 2011-04-18 LAB — CBC
HCT: 44
MCV: 94.1
Platelets: 185
RDW: 15.3
WBC: 8

## 2011-04-18 LAB — DIFFERENTIAL
Basophils Absolute: 0
Basophils Relative: 0
Eosinophils Absolute: 0.1
Eosinophils Relative: 2
Lymphs Abs: 1.7
Neutrophils Relative %: 72

## 2011-04-18 LAB — URINALYSIS, ROUTINE W REFLEX MICROSCOPIC
Ketones, ur: NEGATIVE
Nitrite: NEGATIVE
Protein, ur: 30 — AB
Urobilinogen, UA: 0.2

## 2011-04-18 LAB — BASIC METABOLIC PANEL
BUN: 7
Chloride: 101
Creatinine, Ser: 0.87
Glucose, Bld: 130 — ABNORMAL HIGH

## 2011-04-18 LAB — URINE MICROSCOPIC-ADD ON

## 2011-04-30 ENCOUNTER — Ambulatory Visit
Admission: RE | Admit: 2011-04-30 | Discharge: 2011-04-30 | Disposition: A | Discharge status: Home / Self Care | Payer: Medicare Other | Source: Ambulatory Visit | Attending: Internal Medicine | Admitting: Internal Medicine

## 2011-04-30 DIAGNOSIS — Z1231 Encounter for screening mammogram for malignant neoplasm of breast: Secondary | ICD-10-CM

## 2011-05-14 ENCOUNTER — Other Ambulatory Visit (INDEPENDENT_AMBULATORY_CARE_PROVIDER_SITE_OTHER): Payer: Medicare Other

## 2011-05-14 ENCOUNTER — Encounter: Payer: Self-pay | Admitting: Internal Medicine

## 2011-05-14 ENCOUNTER — Ambulatory Visit (INDEPENDENT_AMBULATORY_CARE_PROVIDER_SITE_OTHER): Payer: Medicare Other | Admitting: Internal Medicine

## 2011-05-14 VITALS — BP 108/60 | HR 96 | Temp 98.4°F | Ht 66.0 in | Wt 176.6 lb

## 2011-05-14 DIAGNOSIS — R7302 Impaired glucose tolerance (oral): Secondary | ICD-10-CM

## 2011-05-14 DIAGNOSIS — R7309 Other abnormal glucose: Secondary | ICD-10-CM

## 2011-05-14 DIAGNOSIS — Z0000 Encounter for general adult medical examination without abnormal findings: Secondary | ICD-10-CM

## 2011-05-14 DIAGNOSIS — E785 Hyperlipidemia, unspecified: Secondary | ICD-10-CM

## 2011-05-14 DIAGNOSIS — Z79899 Other long term (current) drug therapy: Secondary | ICD-10-CM

## 2011-05-14 LAB — DIFFERENTIAL
Basophils Relative: 1
Lymphocytes Relative: 43
Lymphs Abs: 1.6
Monocytes Absolute: 0.3
Monocytes Relative: 8
Neutro Abs: 1.7
Neutrophils Relative %: 46

## 2011-05-14 LAB — CBC
HCT: 44.3
Hemoglobin: 15.2 — ABNORMAL HIGH
MCHC: 34.3
Platelets: 173
RDW: 14.9 — ABNORMAL HIGH

## 2011-05-14 LAB — CBC WITH DIFFERENTIAL/PLATELET
Basophils Absolute: 0 10*3/uL (ref 0.0–0.1)
HCT: 43.8 % (ref 36.0–46.0)
Lymphs Abs: 1.6 10*3/uL (ref 0.7–4.0)
MCV: 95.3 fl (ref 78.0–100.0)
Monocytes Absolute: 0.5 10*3/uL (ref 0.1–1.0)
Neutrophils Relative %: 54.3 % (ref 43.0–77.0)
Platelets: 165 10*3/uL (ref 150.0–400.0)
RDW: 15.1 % — ABNORMAL HIGH (ref 11.5–14.6)

## 2011-05-14 LAB — LIPID PANEL
Cholesterol: 286 — ABNORMAL HIGH
HDL: 47.8 mg/dL (ref >39.00)
LDL Cholesterol: 217 — ABNORMAL HIGH
LDL Cholesterol: 87 mg/dL (ref 0–99)
Total CHOL/HDL Ratio: 3
Triglycerides: 161 — ABNORMAL HIGH
VLDL: 32
VLDL: 31 mg/dL (ref 0.0–40.0)

## 2011-05-14 LAB — BASIC METABOLIC PANEL
Chloride: 107 mEq/L (ref 96–112)
GFR: 86.95 mL/min (ref >60.00)
Glucose, Bld: 105 mg/dL — ABNORMAL HIGH (ref 70–99)
Potassium: 3.8 mEq/L (ref 3.5–5.1)
Sodium: 144 mEq/L (ref 135–145)

## 2011-05-14 LAB — COMPREHENSIVE METABOLIC PANEL
Albumin: 3.8
Alkaline Phosphatase: 50
BUN: 7
Calcium: 9.6
Glucose, Bld: 97
Potassium: 3.9
Total Protein: 7

## 2011-05-14 LAB — CARDIAC PANEL(CRET KIN+CKTOT+MB+TROPI)
CK, MB: 2.8
CK, MB: 3
Relative Index: 1.2
Relative Index: 1.2
Troponin I: 0.05

## 2011-05-14 LAB — HEMOGLOBIN A1C: Hgb A1c MFr Bld: 6.4 % (ref 4.6–6.5)

## 2011-05-14 LAB — URINALYSIS, ROUTINE W REFLEX MICROSCOPIC
Bilirubin Urine: NEGATIVE
Hgb urine dipstick: NEGATIVE
Ketones, ur: NEGATIVE
Urobilinogen, UA: 1 (ref 0.0–1.0)

## 2011-05-14 LAB — HEPATIC FUNCTION PANEL
ALT: 23 U/L (ref 0–35)
AST: 23 U/L (ref 0–37)
Albumin: 4 g/dL (ref 3.5–5.2)
Alkaline Phosphatase: 61 U/L (ref 39–117)
Total Bilirubin: 0.9 mg/dL (ref 0.3–1.2)

## 2011-05-14 LAB — POCT CARDIAC MARKERS
CKMB, poc: 2.1
Myoglobin, poc: 92.3
Troponin i, poc: <0.05

## 2011-05-14 LAB — TSH: TSH: 0.74 u[IU]/mL (ref 0.35–5.50)

## 2011-05-14 LAB — SEDIMENTATION RATE: Sed Rate: 6

## 2011-05-14 NOTE — Progress Notes (Signed)
Subjective:    Patient ID: [name], female    DOB: 26-Jun-1941, 70 y.o.   MRN: [id number]  HPI  Here for wellness and f/u;  Overall doing ok;  Pt denies CP, worsening SOB, DOE, wheezing, orthopnea, PND, worsening LE edema, palpitations, dizziness or syncope.  Pt denies neurological change such as new Headache, facial or extremity weakness.  Pt denies polydipsia, polyuria, or low sugar symptoms. Pt states overall good compliance with treatment and medications, good tolerability, and trying to follow lower cholesterol diet.  Pt denies worsening depressive symptoms, suicidal ideation or panic. No fever, wt loss, night sweats, loss of appetite, or other constitutional symptoms.  Pt states good ability with ADL's, low fall risk, home safety reviewed and adequate, no significant changes in hearing or vision, and occasionally active with exercise.  Doe have some pain to the left groin and has to lift the left leg to get in the car, overall improved and needs little help today. Past Medical History  Diagnosis Date  . Impaired glucose tolerance 01/14/2011  . Acute sinus infection 01/15/2011  . ANXIETY 10/03/2009  . BARRETTS ESOPHAGUS 10/03/2009  . CAROTID ARTERY DISEASE 08/31/2009  . COLONIC POLYPS, HX OF 08/29/2009  . CONSTIPATION, CHRONIC 10/03/2009  . DEPRESSION 10/03/2009  . DISC DISEASE, CERVICAL 08/29/2009  . DISC DISEASE, LUMBAR 08/29/2009  . Eustachian tube dysfunction 01/15/2011  . GERD 08/29/2009  . HYPERLIPIDEMIA 08/29/2009  . LEG PAIN, BILATERAL 08/29/2009  . LOW BACK PAIN, CHRONIC 11/22/2009  . PERIPHERAL NEUROPATHY 10/03/2009  . PERIPHERAL VASCULAR DISEASE 08/29/2009  . POSITIVE PPD 10/03/2009  . Preventative health care 01/14/2011  . TIA 11/22/2009  . TRANSIENT ISCHEMIC ATTACK, HX OF 08/29/2009  . URETHRAL STRICTURE 10/03/2009  . VOCAL CORD POLYP, HX OF 10/03/2009  . WRIST PAIN, BILATERAL 05/25/2010   Past Surgical History  Procedure Date  . S/p vocal cord polyps 1984    chronic hoarseness  . Tubal  ligation   . Stress test negative 2003,2004,2006  . Hx of right ankle fracture     reports that she has never smoked. She does not have any smokeless tobacco history on file. She reports that she does not drink alcohol or use illicit drugs. family history includes Cancer in her other; Diabetes in her mother; Heart disease in her mother; Hypertension in her mother; Multiple myeloma in her sister; and Stroke in her mother. Allergies  Allergen Reactions  . Atorvastatin     REACTION: myalgia  . Penicillins    Current Outpatient Prescriptions on File Prior to Visit  Medication Sig Dispense Refill  . aspirin 325 MG tablet Take 325 mg by mouth daily.        . CRESTOR 20 MG tablet TAKE ONE TABLET BY MOUTH EVERY DAY  90 each  3  . gabapentin (NEURONTIN) 600 MG tablet Take 1 tablet (600 mg total) by mouth 3 (three) times daily as needed.  90 tablet  5  . Multiple Vitamin (MULTIVITAMIN) capsule Take 1 capsule by mouth daily.        Marland Kitchen omeprazole (PRILOSEC) 20 MG capsule TAKE TWO CAPSULES BY MOUTH EVERY DAY  180 capsule  3  . PLAVIX 75 MG tablet TAKE ONE TABLET BY MOUTH EVERY DAY  90 each  1  . predniSONE (DELTASONE) 10 MG tablet 3 tabs by mouth per day for 3 days, 2 tabs per day for 3 days,1 tab per day for 3 days   18 tablet  0  . triamcinolone (KENALOG) 0.1 %  cream Apply topically 2 (two) times daily.  30 g  0   Review of Systems Review of Systems  Constitutional: Negative for diaphoresis, activity change, appetite change and unexpected weight change.  HENT: Negative for hearing loss, ear pain, facial swelling, mouth sores and neck stiffness.   Eyes: Negative for pain, redness and visual disturbance.  Respiratory: Negative for shortness of breath and wheezing.   Cardiovascular: Negative for chest pain and palpitations.  Gastrointestinal: Negative for diarrhea, blood in stool, abdominal distention and rectal pain.  Genitourinary: Negative for hematuria, flank pain and decreased urine volume.    Musculoskeletal: Negative for myalgias and joint swelling.  Skin: Negative for color change and wound.  Neurological: Negative for syncope and numbness.  Hematological: Negative for adenopathy.  Psychiatric/Behavioral: Negative for hallucinations, self-injury, decreased concentration and agitation.      Objective:   Physical Exam BP 108/60  Pulse 96  Temp(Src) 98.4 F (36.9 C) (Oral)  Ht 5\' 6"  (1.676 m)  Wt 176 lb 9.6 oz (80.105 kg)  BMI 28.50 kg/m2  SpO2 96% Physical Exam  VS noted Constitutional: Pt is oriented to person, place, and time. Appears well-developed and well-nourished.  HENT:  Head: Normocephalic and atraumatic.  Right Ear: External ear normal.  Left Ear: External ear normal.  Nose: Nose normal.  Mouth/Throat: Oropharynx is clear and moist.  Eyes: Conjunctivae and EOM are normal. Pupils are equal, round, and reactive to light.  Neck: Normal range of motion. Neck supple. No JVD present. No tracheal deviation present.  Cardiovascular: Normal rate, regular rhythm, normal heart sounds and intact distal pulses.   Pulmonary/Chest: Effort normal and breath sounds normal.  Abdominal: Soft. Bowel sounds are normal. There is no tenderness. except minor epigastric  Musculoskeletal: Normal range of motion. Exhibits no edema.  Lymphadenopathy:  Has no cervical adenopathy.  Neurological: Pt is alert and oriented to person, place, and time. Pt has normal reflexes. No cranial nerve deficit.  Skin: Skin is warm and dry. No rash noted.  Psychiatric:  Has  normal mood and affect. Behavior is normal.     Assessment & Plan:

## 2011-05-14 NOTE — Progress Notes (Signed)
Addended by: Anselm Jungling on: 05/14/2011 04:28 PM   Modules accepted: Orders

## 2011-05-14 NOTE — Assessment & Plan Note (Signed)
Overall doing well, age appropriate education and counseling updated, referrals for preventative services and immunizations addressed, dietary and smoking counseling addressed, most recent labs and ECG reviewed.  I have personally reviewed and have noted: 1) the patient's medical and social history 2) The pt's use of alcohol, tobacco, and illicit drugs 3) The patient's current medications and supplements 4) Functional ability including ADL's, fall risk, home safety risk, hearing and visual impairment 5) Diet and physical activities 6) Evidence for depression or mood disorder 7) The patient's height, weight, and BMI have been recorded in the chart I have made referrals, and provided counseling and education based on review of the above ECG reviewed as per emr For labs today

## 2011-05-14 NOTE — Assessment & Plan Note (Signed)
asympt   - for a1c today  

## 2011-05-14 NOTE — Patient Instructions (Addendum)
Please consider calling your insurance to see if they cover the shingles shot;  If so, please make nurse appt for the shot You had the EKG today Please go to LAB in the Basement for the blood and/or urine tests to be done today Please call the phone number [phone] (the [employer] System) for results of testing in 2-3 days;  When calling, simply dial the number, and when prompted enter the MRN number above (the Medical Record Number) and the # key, then the message should start. Please return in 1 year for your yearly visit, or sooner if needed, with Lab testing done 3-5 days before

## 2011-05-16 LAB — CBC
RBC: 4.45
WBC: 4.1

## 2011-05-16 LAB — DIFFERENTIAL
Basophils Relative: 1
Lymphs Abs: 1.7
Monocytes Relative: 9
Neutro Abs: 1.8
Neutrophils Relative %: 45

## 2011-05-16 LAB — BASIC METABOLIC PANEL
Calcium: 9.6
Chloride: 109
Creatinine, Ser: 0.85
GFR calc non Af Amer: >60
Potassium: 3.8
Sodium: 144

## 2011-05-16 LAB — PROTIME-INR: INR: 1

## 2011-05-31 ENCOUNTER — Other Ambulatory Visit: Payer: Self-pay | Admitting: Internal Medicine

## 2011-05-31 DIAGNOSIS — I6529 Occlusion and stenosis of unspecified carotid artery: Secondary | ICD-10-CM

## 2011-06-01 ENCOUNTER — Encounter: Payer: Medicare Other | Admitting: *Deleted

## 2011-06-28 ENCOUNTER — Encounter (INDEPENDENT_AMBULATORY_CARE_PROVIDER_SITE_OTHER): Payer: Medicare Other | Admitting: Cardiology

## 2011-06-28 DIAGNOSIS — I6529 Occlusion and stenosis of unspecified carotid artery: Secondary | ICD-10-CM

## 2011-07-30 ENCOUNTER — Emergency Department (HOSPITAL_COMMUNITY): Payer: Medicare Other

## 2011-07-30 ENCOUNTER — Encounter (HOSPITAL_COMMUNITY): Payer: Self-pay | Admitting: Emergency Medicine

## 2011-07-30 ENCOUNTER — Inpatient Hospital Stay (HOSPITAL_COMMUNITY)
Admission: EM | Admit: 2011-07-30 | Discharge: 2011-08-02 | DRG: 069 | Disposition: A | Discharge status: Home / Self Care | Payer: Medicare Other | Attending: Internal Medicine | Admitting: Internal Medicine

## 2011-07-30 ENCOUNTER — Other Ambulatory Visit: Payer: Self-pay

## 2011-07-30 DIAGNOSIS — D696 Thrombocytopenia, unspecified: Secondary | ICD-10-CM

## 2011-07-30 DIAGNOSIS — I779 Disorder of arteries and arterioles, unspecified: Secondary | ICD-10-CM | POA: Diagnosis present

## 2011-07-30 DIAGNOSIS — M545 Low back pain: Secondary | ICD-10-CM

## 2011-07-30 DIAGNOSIS — M79609 Pain in unspecified limb: Secondary | ICD-10-CM | POA: Diagnosis present

## 2011-07-30 DIAGNOSIS — M5137 Other intervertebral disc degeneration, lumbosacral region: Secondary | ICD-10-CM

## 2011-07-30 DIAGNOSIS — R4789 Other speech disturbances: Secondary | ICD-10-CM | POA: Diagnosis present

## 2011-07-30 DIAGNOSIS — N35919 Unspecified urethral stricture, male, unspecified site: Secondary | ICD-10-CM

## 2011-07-30 DIAGNOSIS — I739 Peripheral vascular disease, unspecified: Secondary | ICD-10-CM | POA: Diagnosis present

## 2011-07-30 DIAGNOSIS — Z8673 Personal history of transient ischemic attack (TIA), and cerebral infarction without residual deficits: Secondary | ICD-10-CM

## 2011-07-30 DIAGNOSIS — Z87898 Personal history of other specified conditions: Secondary | ICD-10-CM

## 2011-07-30 DIAGNOSIS — F3289 Other specified depressive episodes: Secondary | ICD-10-CM | POA: Diagnosis present

## 2011-07-30 DIAGNOSIS — F411 Generalized anxiety disorder: Secondary | ICD-10-CM | POA: Diagnosis present

## 2011-07-30 DIAGNOSIS — K2270 Barrett's esophagus without dysplasia: Secondary | ICD-10-CM

## 2011-07-30 DIAGNOSIS — Z8679 Personal history of other diseases of the circulatory system: Secondary | ICD-10-CM

## 2011-07-30 DIAGNOSIS — K219 Gastro-esophageal reflux disease without esophagitis: Secondary | ICD-10-CM | POA: Diagnosis present

## 2011-07-30 DIAGNOSIS — Z86010 Personal history of colonic polyps: Secondary | ICD-10-CM

## 2011-07-30 DIAGNOSIS — R21 Rash and other nonspecific skin eruption: Secondary | ICD-10-CM

## 2011-07-30 DIAGNOSIS — M5030 Other cervical disc degeneration, unspecified cervical region: Secondary | ICD-10-CM

## 2011-07-30 DIAGNOSIS — Z0000 Encounter for general adult medical examination without abnormal findings: Secondary | ICD-10-CM

## 2011-07-30 DIAGNOSIS — M25539 Pain in unspecified wrist: Secondary | ICD-10-CM

## 2011-07-30 DIAGNOSIS — F329 Major depressive disorder, single episode, unspecified: Secondary | ICD-10-CM | POA: Diagnosis present

## 2011-07-30 DIAGNOSIS — G459 Transient cerebral ischemic attack, unspecified: Principal | ICD-10-CM | POA: Diagnosis present

## 2011-07-30 DIAGNOSIS — R7302 Impaired glucose tolerance (oral): Secondary | ICD-10-CM

## 2011-07-30 DIAGNOSIS — G609 Hereditary and idiopathic neuropathy, unspecified: Secondary | ICD-10-CM | POA: Diagnosis present

## 2011-07-30 DIAGNOSIS — E785 Hyperlipidemia, unspecified: Secondary | ICD-10-CM | POA: Diagnosis present

## 2011-07-30 DIAGNOSIS — I6529 Occlusion and stenosis of unspecified carotid artery: Secondary | ICD-10-CM

## 2011-07-30 DIAGNOSIS — K5909 Other constipation: Secondary | ICD-10-CM

## 2011-07-30 DIAGNOSIS — E079 Disorder of thyroid, unspecified: Secondary | ICD-10-CM | POA: Diagnosis present

## 2011-07-30 LAB — CBC
HCT: 45.6 % (ref 36.0–46.0)
Hemoglobin: 14.9 g/dL (ref 12.0–15.0)
RDW: 14.3 % (ref 11.5–15.5)
WBC: 4.6 10*3/uL (ref 4.0–10.5)

## 2011-07-30 LAB — DIFFERENTIAL
Basophils Absolute: 0 10*3/uL (ref 0.0–0.1)
Lymphocytes Relative: 44 % (ref 12–46)
Monocytes Absolute: 0.4 10*3/uL (ref 0.1–1.0)
Neutro Abs: 2.1 10*3/uL (ref 1.7–7.7)

## 2011-07-30 LAB — COMPREHENSIVE METABOLIC PANEL
ALT: 22 U/L (ref 0–35)
AST: 24 U/L (ref 0–37)
Alkaline Phosphatase: 76 U/L (ref 39–117)
CO2: 27 mEq/L (ref 19–32)
Chloride: 105 mEq/L (ref 96–112)
Creatinine, Ser: 0.82 mg/dL (ref 0.50–1.10)
GFR calc non Af Amer: 70 mL/min — ABNORMAL LOW (ref >90)
Potassium: 3.7 mEq/L (ref 3.5–5.1)
Total Bilirubin: 0.5 mg/dL (ref 0.3–1.2)

## 2011-07-30 LAB — POCT I-STAT, CHEM 8
BUN: 7 mg/dL (ref 6–23)
Chloride: 107 mEq/L (ref 96–112)
Creatinine, Ser: 0.9 mg/dL (ref 0.50–1.10)
Glucose, Bld: 90 mg/dL (ref 70–99)
Potassium: 3.7 mEq/L (ref 3.5–5.1)

## 2011-07-30 LAB — CK TOTAL AND CKMB (NOT AT ARMC): CK, MB: 5.1 ng/mL — ABNORMAL HIGH (ref 0.3–4.0)

## 2011-07-30 MED ORDER — OXYCODONE-ACETAMINOPHEN 5-325 MG PO TABS
1.0000 | ORAL_TABLET | Freq: Once | ORAL | Status: AC
  Administered 2011-07-30: 1 via ORAL
  Filled 2011-07-30: qty 1

## 2011-07-30 MED ORDER — ONDANSETRON HCL 4 MG/2ML IJ SOLN: 4.0000 mg | Freq: Four times a day (QID) | INTRAMUSCULAR | Status: DC | PRN

## 2011-07-30 MED ORDER — ASPIRIN 325 MG PO TABS
325.0000 mg | ORAL_TABLET | Freq: Every day | ORAL | Status: DC
  Administered 2011-07-31 – 2011-08-02 (×3): 325 mg via ORAL
  Filled 2011-07-30 (×3): qty 1

## 2011-07-30 MED ORDER — CLOPIDOGREL BISULFATE 75 MG PO TABS
75.0000 mg | ORAL_TABLET | Freq: Every day | ORAL | Status: DC
  Administered 2011-07-31 – 2011-08-02 (×3): 75 mg via ORAL
  Filled 2011-07-30 (×3): qty 1

## 2011-07-30 MED ORDER — PREDNISONE 5 MG PO TABS
5.0000 mg | ORAL_TABLET | Freq: Every day | ORAL | Status: DC
  Administered 2011-07-31 – 2011-08-02 (×3): 5 mg via ORAL
  Filled 2011-07-30 (×3): qty 1

## 2011-07-30 MED ORDER — ASPIRIN 81 MG PO CHEW
324.0000 mg | CHEWABLE_TABLET | Freq: Once | ORAL | Status: AC
  Administered 2011-07-30: 324 mg via ORAL
  Filled 2011-07-30: qty 4

## 2011-07-30 MED ORDER — GADOBENATE DIMEGLUMINE 529 MG/ML IV SOLN
16.0000 mL | Freq: Once | INTRAVENOUS | Status: AC | PRN
  Administered 2011-07-30: 16 mL via INTRAVENOUS

## 2011-07-30 MED ORDER — ROSUVASTATIN CALCIUM 10 MG PO TABS
10.0000 mg | ORAL_TABLET | Freq: Every day | ORAL | Status: DC
  Administered 2011-07-31: 10 mg via ORAL
  Filled 2011-07-30 (×2): qty 1

## 2011-07-30 MED ORDER — ASPIRIN 81 MG PO CHEW
324.0000 mg | CHEWABLE_TABLET | Freq: Every day | ORAL | Status: DC
  Filled 2011-07-30: qty 4

## 2011-07-30 MED ORDER — SODIUM CHLORIDE 0.9 % IV SOLN
INTRAVENOUS | Status: AC
  Administered 2011-07-30: 17:00:00 via INTRAVENOUS

## 2011-07-30 MED ORDER — GABAPENTIN 400 MG PO CAPS
400.0000 mg | ORAL_CAPSULE | Freq: Two times a day (BID) | ORAL | Status: DC
  Administered 2011-07-31 – 2011-08-02 (×6): 400 mg via ORAL
  Filled 2011-07-30 (×7): qty 1

## 2011-07-30 MED ORDER — ENOXAPARIN SODIUM 40 MG/0.4ML ~~LOC~~ SOLN
40.0000 mg | SUBCUTANEOUS | Status: DC
  Administered 2011-07-31 – 2011-08-01 (×3): 40 mg via SUBCUTANEOUS
  Filled 2011-07-30 (×4): qty 0.4

## 2011-07-30 MED ORDER — SENNOSIDES-DOCUSATE SODIUM 8.6-50 MG PO TABS
1.0000 | ORAL_TABLET | Freq: Every evening | ORAL | Status: DC | PRN
  Filled 2011-07-30: qty 1

## 2011-07-30 MED ORDER — PANTOPRAZOLE SODIUM 40 MG PO TBEC
40.0000 mg | DELAYED_RELEASE_TABLET | Freq: Every day | ORAL | Status: DC
  Administered 2011-07-31 – 2011-08-02 (×4): 40 mg via ORAL
  Filled 2011-07-30 (×4): qty 1

## 2011-07-30 NOTE — ED Notes (Signed)
Pt states that  woke up around 3am this morning with R sided pain starting in her foot and progressing upward. Weakness, falling into things. Slurred speech around 11am. Headache around 1pm. BP 157/81. HR 61.

## 2011-07-30 NOTE — H&P (Signed)
PRIMARY CARE PROVIDER:  Corwin Levins, MD.      CHIEF COMPLAINT:  Right Leg pain, right upper extremity pain, right-sided headache     The patient is a 70 year old female, who for the past 1 month or so has   been having intermittent right neck, right shoulder and right arm   shooting pains, as well as pains going down in her right leg with loss   of sensation below the knee in her leg which was intermittent. 2 months ago she noticed left lower extremity weakness and numbness. This was self-limiting and resolve within a day. This morning, since 3 AM, she started noticing right leg pain, below-the-knee. The pain proceeded to travel up her right leg, she started noticing right upper extremity weakness and pain. Subsequently noticed a right-sided temporal headache , associated with slurred speech that lasted only 15 minutes. She denied any blurry vision or diplopia. She also noted that she was ataxic, and while ambulating, she would tend to fall, to her right. She has a history of left ICA stenosis, most recently was told that this is 40-69%. She had a carotid Doppler done in April of 2012. She also saw Dr. Kellie Simmering, rheumatology for arthritis. She recently completed a five-day course of prednisone.  results of that at this point, although it has been done.    In the ER, she was found to be neurologically intact, but   given recurrent symptoms the worry was for potential TIA at which point   [facility] was called for admission.      REVIEW OF SYSTEMS:  Otherwise, no fevers, no chills.  She had, had some   tightness in her chest, but no shortness of breath.  No lower extremity   swelling.  She does report some difficulty with gait, leaning towards   the right when she walks.      PAST MEDICAL HISTORY:   1. Hyperlipidemia.   2. History of TIA versus migraines in the past.   3. GERD.   4. Musculoskeletal chest pain.   5. Acute sinusitis in the past.   6. Neck pain secondary to muscle spasm in  the past on the left side.   7. Possibly restless leg syndrome.   8. Medication noncompliance in the past.      SOCIAL HISTORY:  The patient does not smoke or drink, does not abuse   drugs.      FAMILY HISTORY:  Significant for cancer, hypertension and stroke.  Her   father died of pneumonia.  Mother died of a series of strokes.      ALLERGIES:  PENICILLIN.     Home Medications    Current Outpatient Rx   Name  Route  Sig  Dispense  Refill   .  ASPIRIN 325 MG PO TABS  Oral  Take 325 mg by mouth daily.     .  CRESTOR 20 MG PO TABS   TAKE ONE TABLET BY MOUTH EVERY DAY  90 each  3   .  GABAPENTIN 600 MG PO TABS  Oral  Take 1 tablet (600 mg total) by mouth 3 (three) times daily as needed.  90 tablet  5   .  MULTIVITAMINS PO CAPS  Oral  Take 1 capsule by mouth daily.     Marland Kitchen  OMEPRAZOLE 20 MG PO CPDR   TAKE TWO CAPSULES BY MOUTH EVERY DAY  180 capsule  3   .  PLAVIX 75 MG PO TABS  TAKE ONE TABLET BY MOUTH EVERY DAY  90 each  1   .  PREDNISONE 10 MG PO TABS   3 tabs by mouth per day for 3 days, 2 tabs per day for 3 days,1 tab per day for 3 days  18 tablet  0   .  TRIAMCINOLONE ACETONIDE 0.1 % EX CREA  Topical  Apply topically 2 (two) times daily.  30 g  0           PHYSICAL EXAMINATION:  VITAL SIGNS: BP 157/81  Pulse 61  SpO2 100%   GENERAL:  The patient appears to be in no acute distress, elderly   female.  HEENT:  Head nontraumatic.  Moist mucous membranes.   LUNGS:  Clear to auscultation bilaterally.   HEART:  Regular rate and rhythm.  No murmurs appreciated.   ABDOMEN:  Soft, nontender, nondistended.   LOWER EXTREMITIES:  Without clubbing, cyanosis or edema.   NEUROLOGIC:  Strength 5/5 throughout, equal bilaterally.  Cranial II-XII   intact.   NEUROLOGICALLY:  She is alert and oriented to person, place, and time. She displays normal reflexes. No cranial nerve deficit. She exhibits normal muscle tone. Coordination normal.  No obvious facial droop noted. Slurred speech and  difficulty with articulation is noted. No obvious pronator drift. Finger to nose testing and heel-to-shin testing is normal bilaterally. Some difficulty with motor, strength in the lower extremity, approximately 4/5. As compared to 5 out of 5 in the left. Gait not examined at this time secondary to the weakness. Neck is supple.    SKIN:  Clean, dry and intact.     Results for orders placed during the hospital encounter of 07/30/11 (from the past 24 hour(s))  CK TOTAL AND CKMB     Status: Abnormal   Collection Time   07/30/11  3:35 PM      Component Value Range   Total CK 241 (*) 7 - 177 (U/L)   CK, MB 5.1 (*) 0.3 - 4.0 (ng/mL)   Relative Index 2.1  0.0 - 2.5   TROPONIN I     Status: Normal   Collection Time   07/30/11  3:35 PM      Component Value Range   Troponin I <0.30  <0.30 (ng/mL)  CBC     Status: Abnormal   Collection Time   07/30/11  3:36 PM      Component Value Range   WBC 4.6  4.0 - 10.5 (K/uL)   RBC 4.88  3.87 - 5.11 (MIL/uL)   Hemoglobin 14.9  12.0 - 15.0 (g/dL)   HCT 57.8  46.9 - 62.9 (%)   MCV 93.4  78.0 - 100.0 (fL)   MCH 30.5  26.0 - 34.0 (pg)   MCHC 32.7  30.0 - 36.0 (g/dL)   RDW 52.8  41.3 - 24.4 (%)   Platelets 149 (*) 150 - 400 (K/uL)  DIFFERENTIAL     Status: Normal   Collection Time   07/30/11  3:36 PM      Component Value Range   Neutrophils Relative 46  43 - 77 (%)   Neutro Abs 2.1  1.7 - 7.7 (K/uL)   Lymphocytes Relative 44  12 - 46 (%)   Lymphs Abs 2.0  0.7 - 4.0 (K/uL)   Monocytes Relative 8  3 - 12 (%)   Monocytes Absolute 0.4  0.1 - 1.0 (K/uL)   Eosinophils Relative 2  0 - 5 (%)   Eosinophils Absolute 0.1  0.0 -  0.7 (K/uL)   Basophils Relative 1  0 - 1 (%)   Basophils Absolute 0.0  0.0 - 0.1 (K/uL)  COMPREHENSIVE METABOLIC PANEL     Status: Abnormal   Collection Time   07/30/11  3:36 PM      Component Value Range   Sodium 140  135 - 145 (mEq/L)   Potassium 3.7  3.5 - 5.1 (mEq/L)   Chloride 105  96 - 112 (mEq/L)   CO2 27  19 - 32 (mEq/L)     Glucose, Bld 89  70 - 99 (mg/dL)   BUN 9  6 - 23 (mg/dL)   Creatinine, Ser 4.09  0.50 - 1.10 (mg/dL)   Calcium 9.3  8.4 - 81.1 (mg/dL)   Total Protein 7.0  6.0 - 8.3 (g/dL)   Albumin 4.0  3.5 - 5.2 (g/dL)   AST 24  0 - 37 (U/L)   ALT 22  0 - 35 (U/L)   Alkaline Phosphatase 76  39 - 117 (U/L)   Total Bilirubin 0.5  0.3 - 1.2 (mg/dL)   GFR calc non Af Amer 70 (*) >90 (mL/min)   GFR calc Af Amer 81 (*) >90 (mL/min)  POCT I-STAT, CHEM 8     Status: Abnormal   Collection Time   07/30/11  3:47 PM      Component Value Range   Sodium 144  135 - 145 (mEq/L)   Potassium 3.7  3.5 - 5.1 (mEq/L)   Chloride 107  96 - 112 (mEq/L)   BUN 7  6 - 23 (mg/dL)   Creatinine, Ser 9.14  0.50 - 1.10 (mg/dL)   Glucose, Bld 90  70 - 99 (mg/dL)   Calcium, Ion 7.82  9.56 - 1.32 (mmol/L)   TCO2 26  0 - 100 (mmol/L)   Hemoglobin 15.3 (*) 12.0 - 15.0 (g/dL)   HCT 21.3  08.6 - 57.8 (%)  GLUCOSE, CAPILLARY     Status: Normal   Collection Time   07/30/11  3:57 PM      Component Value Range   Glucose-Capillary 80  70 - 99 (mg/dL)   Comment 1 Documented in Chart     Comment 2 Notify RN    PROTIME-INR     Status: Normal   Collection Time   07/30/11  4:24 PM      Component Value Range   Prothrombin Time 13.5  11.6 - 15.2 (seconds)   INR 1.01  0.00 - 1.49   APTT     Status: Normal   Collection Time   07/30/11  4:24 PM      Component Value Range   aPTT 32  24 - 37 (seconds)         ASSESSMENT/PLAN:  This is a 70 year old female with nonspecific   neurological complaints and shooting pains, which could be possibly   related to radiculopathy, but also her other complaints with slurred   speech, ataxia and lip numbness seem to be more of a transient ischemic   attack like presentation.  We will admit for further evaluation.  She   had workup in 2009, and followed by serial carotid Doppler, for a similar presentation in the past, which showed   questionable coronary artery stenosis.      1. Question of  transient ischemic attack.  We will admit, we will       obtain MRI and MRA of the brain, carotid Dopplers and 2-D echo.       Would recommend neurology  consult, in a.m. to see if she would need a cerebral angiogram by Dr. Mertie Clause .  Make sure she is on full-dose       aspirin.  Do a bedside swallow evaluation. Because of concern about cervical and lumbar radiculopathy multilumen MRI of the C-spine and the L-spine.     #2. History of hyperlipidemia.  Continue Crestor.  #3. History of gastroesophageal reflux disease.  We will make sure she       is on  Protonix and for prophylaxis we will do sequential       compression devices.      CODE STATUS:  The patient wished to be full code.

## 2011-07-30 NOTE — ED Notes (Signed)
BJY:[id number]<FA> Expected date:<BR> Expected time:<BR> Means of arrival:<BR> Comments:<BR> CLOSED

## 2011-07-30 NOTE — ED Provider Notes (Signed)
History     CSN: [id number]  Arrival date & time 07/30/11  1427   First MD Initiated Contact with Patient 07/30/11 1512      Chief Complaint  Patient presents with  . Right Sided Body Pain   . Aphasia   patient noticed initial symptoms at approximately 3 AM this morning. This began with pain in her right lower extremity that extended up her right lower extremity. She then noticed weakness in the same extremity. At that time and also began having some pain and weakness in her right upper extremity. Shortly thereafter. She noticed that she was falling to the right when she attempted to ambulate. She noticed slurred speech and difficulty with articulation. At 11 AM. Patient states she began to feel more concerned when she had a diffuse headache that began gradually at 1 PM. She's had no nausea, vomiting. Denies any chest pain or neck pain. Vital signs were normal in triage. She's had no recent injury or illness. She did take her normal medications at home including Plavix.  Past medical history is very extensive, please, see chart for full details.  Patient also indicated that she does have blockage in her right carotid artery from previous studies.  (Consider location/radiation/quality/duration/timing/severity/associated sxs/prior treatment) HPI  Past Medical History  Diagnosis Date  . Impaired glucose tolerance 01/14/2011  . Acute sinus infection 01/15/2011  . ANXIETY 10/03/2009  . BARRETTS ESOPHAGUS 10/03/2009  . CAROTID ARTERY DISEASE 08/31/2009  . COLONIC POLYPS, HX OF 08/29/2009  . CONSTIPATION, CHRONIC 10/03/2009  . DEPRESSION 10/03/2009  . DISC DISEASE, CERVICAL 08/29/2009  . DISC DISEASE, LUMBAR 08/29/2009  . Eustachian tube dysfunction 01/15/2011  . GERD 08/29/2009  . HYPERLIPIDEMIA 08/29/2009  . LEG PAIN, BILATERAL 08/29/2009  . LOW BACK PAIN, CHRONIC 11/22/2009  . PERIPHERAL NEUROPATHY 10/03/2009  . PERIPHERAL VASCULAR DISEASE 08/29/2009  . POSITIVE PPD 10/03/2009  . Preventative health care  01/14/2011  . TIA 11/22/2009  . TRANSIENT ISCHEMIC ATTACK, HX OF 08/29/2009  . URETHRAL STRICTURE 10/03/2009  . VOCAL CORD POLYP, HX OF 10/03/2009  . WRIST PAIN, BILATERAL 05/25/2010    Past Surgical History  Procedure Date  . S/p vocal cord polyps 1984    chronic hoarseness  . Tubal ligation   . Stress test negative 2003,2004,2006  . Hx of right ankle fracture     Family History  Problem Relation Age of Onset  . Stroke Mother   . Heart disease Mother   . Hypertension Mother   . Diabetes Mother   . Multiple myeloma Sister   . Cancer Other     lung    History  Substance Use Topics  . Smoking status: Never Smoker   . Smokeless tobacco: Not on file  . Alcohol Use: No    OB History    Grav Para Term Preterm Abortions TAB SAB Ect Mult Living                  Review of Systems  All other systems reviewed and are negative.    Allergies  Atorvastatin and Penicillins  Home Medications   Current Outpatient Rx  Name Route Sig Dispense Refill  . ASPIRIN 325 MG PO TABS Oral Take 325 mg by mouth daily.      . CRESTOR 20 MG PO TABS  TAKE ONE TABLET BY MOUTH EVERY DAY 90 each 3  . GABAPENTIN 600 MG PO TABS Oral Take 1 tablet (600 mg total) by mouth 3 (three) times daily as needed. 90  tablet 5  . MULTIVITAMINS PO CAPS Oral Take 1 capsule by mouth daily.      Marland Kitchen OMEPRAZOLE 20 MG PO CPDR  TAKE TWO CAPSULES BY MOUTH EVERY DAY 180 capsule 3  . PLAVIX 75 MG PO TABS  TAKE ONE TABLET BY MOUTH EVERY DAY 90 each 1  . PREDNISONE 10 MG PO TABS  3 tabs by mouth per day for 3 days, 2 tabs per day for 3 days,1 tab per day for 3 days  18 tablet 0  . TRIAMCINOLONE ACETONIDE 0.1 % EX CREA Topical Apply topically 2 (two) times daily. 30 g 0    BP 157/81  Pulse 61  SpO2 100%  Physical Exam  Nursing note and vitals reviewed. Constitutional: She is oriented to person, place, and time. She appears well-developed and well-nourished.  HENT:  Head: Normocephalic and atraumatic.  Eyes:  Conjunctivae and EOM are normal. Pupils are equal, round, and reactive to light.  Neck: Neck supple.  Cardiovascular: Normal rate and regular rhythm.  Exam reveals no gallop and no friction rub.   No murmur heard. Pulmonary/Chest: Breath sounds normal. She has no wheezes. She has no rales. She exhibits no tenderness.  Abdominal: Soft. Bowel sounds are normal. She exhibits no distension. There is no tenderness. There is no rebound and no guarding.  Musculoskeletal: Normal range of motion.  Neurological: She is alert and oriented to person, place, and time. She displays normal reflexes. No cranial nerve deficit. She exhibits normal muscle tone. Coordination normal.       No obvious facial droop noted. Slurred speech and difficulty with articulation is noted. No obvious pronator drift. Finger to nose testing and heel-to-shin testing is normal bilaterally. Some difficulty with motor, strength in the lower extremity, approximately 4/5. As compared to 5 out of 5 in the left. Gait not examined at this time secondary to the weakness. Neck is supple.  Skin: Skin is warm and dry. No rash noted.  Psychiatric: She has a normal mood and affect.    ED Course  Procedures (including critical care time)  Labs Reviewed  CBC - Abnormal; Notable for the following:    Platelets 149 (*)    All other components within normal limits  COMPREHENSIVE METABOLIC PANEL - Abnormal; Notable for the following:    GFR calc non Af Amer 70 (*)    GFR calc Af Amer 81 (*)    All other components within normal limits  CK TOTAL AND CKMB - Abnormal; Notable for the following:    Total CK 241 (*)    CK, MB 5.1 (*)    All other components within normal limits  POCT I-STAT, CHEM 8 - Abnormal; Notable for the following:    Hemoglobin 15.3 (*)    All other components within normal limits  DIFFERENTIAL  TROPONIN I  GLUCOSE, CAPILLARY  I-STAT, CHEM 8  POCT CBG MONITORING  PROTIME-INR  APTT   Ct Head Wo Contrast  07/30/2011   *RADIOLOGY REPORT*  Clinical Data: 70 year old female right side weakness, slurred speech.  CT HEAD WITHOUT CONTRAST  Technique:  Contiguous axial images were obtained from the base of the skull through the vertex without contrast.  Comparison: 11/30/2009 and earlier.  Findings: Visualized paranasal sinuses and mastoids are clear.  No acute osseous abnormality identified.  Visualized orbits and scalp soft tissues are within normal limits.  Cerebral volume is within normal limits for age.  No midline shift, ventriculomegaly, mass effect, evidence of mass lesion, intracranial hemorrhage or  evidence of cortically based acute infarction.  Gray-white matter differentiation is within normal limits throughout the brain.  No suspicious intracranial vascular hyperdensity.  IMPRESSION: Stable and normal for age noncontrast CT of the brain.  Original Report Authenticated By: Ulla Potash III, M.D.     1. TIA (transient ischemic attack)   2. Thrombocytopenia       MDM  Pt is seen and examined;  Initial history and physical completed.  Will follow.  Full ED stroke panel has been ordered. Patient will not be a code stroke secondary to the time frame. Initial blood pressure was 157/81. Will review previous records as well.  Previous Admission records from Feb 2012:   NAME: ADDILYNNE, SIRIGNANO ACCOUNT NO.: [id number]  MEDICAL RECORD NO.: [id number] PATIENT TYPE: I  LOCATION: 6524 FACILITY: [facility]  PHYSICIAN: Rock Nephew, MD DATE OF BIRTH: 1941-03-01  DATE OF ADMISSION: 09/12/2010  DATE OF DISCHARGE: 09/12/2010  DISCHARGE SUMMARY - REFERRING  PRIMARY CARE PHYSICIAN: Corwin Levins, MD  DISCHARGE DIAGNOSES:  1. Atypical chest pain.  2. Hyperlipidemia.  3. Dyspepsia.  4. Left carotid artery stenosis.  5. Anxiety.  DISCHARGE MEDICATIONS:  1. Pantoprazole 40 mg p.o. daily.  2. Crestor 20 mg p.o. daily.  3. Plavix 75 mg p.o. daily.  4. Xanax 0.5 mg p.o. b.i.d.  5. Maalox 10 mL p.o. q.4 h p.r.n. dyspepsia.  6.  Omeprazole 20 mg p.o. daily.  DISPOSITION: The patient is discharged home.  FOLLOWUP: The patient should follow up with Dr. Oliver Barre within 1  week. The patient is to obtain a referral to see cardiologist. The  patient should also see Dr. Randa Evens in 2-3 weeks.  CONSULTATIONS ON THIS CASE: None.  PROCEDURES PERFORMED: Two-D echo is pending. The patient also had the  chest x-ray two-view; lungs were clear, heart size was normal.  CHIEF COMPLAINT: Chest pain.  BRIEF HISTORY AND PHYSICAL: Ms. Guerrier is a 70 year old female with a  past medical history significant for TIA and left carotid artery  stenosis, hyperlipidemia, who presents with chief complaint of chest  pain. The patient reports that she was passing gas, having some  belching. She had epigastric pain, was not associated with shortness of  breath, no radiation. The patient was brought in to the ED. In the ED,  the patient's initial cardiac enzymes have been negative. The patient's  EKG has no changes from the old EKG.  HOSPITAL COURSE:  1. Chest pain: The patient is admitted to the hospital in telemetry.  The patient is chest painfree. The patient's cardiac enzymes were  negative x1. The patient's EKG has no changes. The patient is  continued on Plavix as well as Crestor. The patient will have a 2-  D echocardiogram done. The 2-D echocardiogram has no changes. The  patient will be discharged home with a followup with Dr. Oliver Barre  and referral to Cardiology. This pain also seemed possibly to be  gastrointestinal and the patient may need an upper endoscopy and  follow up with Dr. Carman Ching of [facility]. The patient should  also be continued on PPI.  2. GERD: The patient was continued on PPI.  3. Hyperlipidemia: The patient had a full lipid profile checked. The  patient's LDL was 104, HDL was 48, triglycerides were 133.  4. Left carotid artery stenosis: The patient will be continued on  statin and Plavix.  5. Mild  hypoglycemia: We will also check a hemoglobin A1c for the  patient.  6. Anxiety: The patient  was continued on alprazolam at home dose.  Note: This is not an official document until electronically signed.  Rock Nephew, MD  NH/MEDQ D: 09/12/2010 T: 09/12/2010 Job: [id number]  cc: Corwin Levins, MD       4:46 PM  Date: 07/30/2011  Rate: 59  Rhythm: sinus bradycardia  QRS Axis: normal  Intervals: normal  ST/T Wave abnormalities: nonspecific T wave changes  Conduction Disutrbances:none  Narrative Interpretation:   Old EKG Reviewed: unchanged   Results for orders placed during the hospital encounter of 07/30/11  CBC      Component Value Range   WBC 4.6  4.0 - 10.5 (K/uL)   RBC 4.88  3.87 - 5.11 (MIL/uL)   Hemoglobin 14.9  12.0 - 15.0 (g/dL)   HCT 46.9  62.9 - 52.8 (%)   MCV 93.4  78.0 - 100.0 (fL)   MCH 30.5  26.0 - 34.0 (pg)   MCHC 32.7  30.0 - 36.0 (g/dL)   RDW 41.3  24.4 - 01.0 (%)   Platelets 149 (*) 150 - 400 (K/uL)  DIFFERENTIAL      Component Value Range   Neutrophils Relative 46  43 - 77 (%)   Neutro Abs 2.1  1.7 - 7.7 (K/uL)   Lymphocytes Relative 44  12 - 46 (%)   Lymphs Abs 2.0  0.7 - 4.0 (K/uL)   Monocytes Relative 8  3 - 12 (%)   Monocytes Absolute 0.4  0.1 - 1.0 (K/uL)   Eosinophils Relative 2  0 - 5 (%)   Eosinophils Absolute 0.1  0.0 - 0.7 (K/uL)   Basophils Relative 1  0 - 1 (%)   Basophils Absolute 0.0  0.0 - 0.1 (K/uL)  COMPREHENSIVE METABOLIC PANEL      Component Value Range   Sodium 140  135 - 145 (mEq/L)   Potassium 3.7  3.5 - 5.1 (mEq/L)   Chloride 105  96 - 112 (mEq/L)   CO2 27  19 - 32 (mEq/L)   Glucose, Bld 89  70 - 99 (mg/dL)   BUN 9  6 - 23 (mg/dL)   Creatinine, Ser 2.72  0.50 - 1.10 (mg/dL)   Calcium 9.3  8.4 - 53.6 (mg/dL)   Total Protein 7.0  6.0 - 8.3 (g/dL)   Albumin 4.0  3.5 - 5.2 (g/dL)   AST 24  0 - 37 (U/L)   ALT 22  0 - 35 (U/L)   Alkaline Phosphatase 76  39 - 117 (U/L)   Total Bilirubin 0.5  0.3 - 1.2 (mg/dL)   GFR calc  non Af Amer 70 (*) >90 (mL/min)   GFR calc Af Amer 81 (*) >90 (mL/min)  CK TOTAL AND CKMB      Component Value Range   Total CK 241 (*) 7 - 177 (U/L)   CK, MB 5.1 (*) 0.3 - 4.0 (ng/mL)   Relative Index 2.1  0.0 - 2.5   TROPONIN I      Component Value Range   Troponin I <0.30  <0.30 (ng/mL)  POCT I-STAT, CHEM 8      Component Value Range   Sodium 144  135 - 145 (mEq/L)   Potassium 3.7  3.5 - 5.1 (mEq/L)   Chloride 107  96 - 112 (mEq/L)   BUN 7  6 - 23 (mg/dL)   Creatinine, Ser 6.44  0.50 - 1.10 (mg/dL)   Glucose, Bld 90  70 - 99 (mg/dL)   Calcium, Ion 0.34  7.42 - 1.32 (mmol/L)  TCO2 26  0 - 100 (mmol/L)   Hemoglobin 15.3 (*) 12.0 - 15.0 (g/dL)   HCT 16.1  09.6 - 04.5 (%)  GLUCOSE, CAPILLARY      Component Value Range   Glucose-Capillary 80  70 - 99 (mg/dL)   Comment 1 Documented in Chart     Comment 2 Notify RN     No results found.   [facility] paged for admission    Aleigha Gilani A. Patrica Duel, MD 07/31/11 1456

## 2011-07-30 NOTE — ED Notes (Signed)
Pt remains in MRI 

## 2011-07-30 NOTE — ED Notes (Addendum)
Patient transported to MRI. Called Dr. Lynford Citizen and confirmed that pt could transport off monitor. She agreed that she could.

## 2011-07-31 LAB — LIPID PANEL
Total CHOL/HDL Ratio: 3.8 RATIO
VLDL: 17 mg/dL (ref 0–40)

## 2011-07-31 MED ORDER — ROSUVASTATIN CALCIUM 20 MG PO TABS
20.0000 mg | ORAL_TABLET | Freq: Every day | ORAL | Status: DC
  Administered 2011-08-01: 20 mg via ORAL
  Filled 2011-07-31 (×2): qty 1

## 2011-07-31 NOTE — Progress Notes (Signed)
Subjective: Feeling better today.  Pain in her legs improved.  Objective: Vital signs in last 24 hours: Filed Vitals:   07/30/11 1953 07/30/11 2313 07/31/11 0522 07/31/11 1509  BP: 110/79 125/67 110/66 107/58  Pulse: 56 57 58 58  Temp:  98.3 F (36.8 C) 98 F (36.7 C) 98.1 F (36.7 C)  TempSrc:  Oral Oral Oral  Resp: 16 18 16 18   Height:  5\' 6"  (1.676 m)    Weight:  79.062 kg (174 lb 4.8 oz)    SpO2: 99% 98% 97% 93%   Weight change:   Intake/Output Summary (Last 24 hours) at 07/31/11 1843 Last data filed at 07/31/11 1200  Gross per 24 hour  Intake      0 ml  Output   1150 ml  Net  -1150 ml    Physical Exam: General: Awake, Oriented, No acute distress. HEENT: EOMI. Neck: Supple CV: S1 and S2 Lungs: Clear to ascultation bilaterally Abdomen: Soft, Nontender, Nondistended, +bowel sounds. Ext: Good pulses. Trace edema.  Lab Results:  [facility] 07/30/11 1547 07/30/11 1536  NA 144 140  K 3.7 3.7  CL 107 105  CO2 -- 27  GLUCOSE 90 89  BUN 7 9  CREATININE 0.90 0.82  CALCIUM -- 9.3  MG -- --  PHOS -- --    Basename 07/30/11 1536  AST 24  ALT 22  ALKPHOS 76  BILITOT 0.5  PROT 7.0  ALBUMIN 4.0   No results found for this basename: LIPASE:2,AMYLASE:2 in the last 72 hours  Basename 07/30/11 1547 07/30/11 1536  WBC -- 4.6  NEUTROABS -- 2.1  HGB 15.3* 14.9  HCT 45.0 45.6  MCV -- 93.4  PLT -- 149*    Basename 07/30/11 1535  CKTOTAL 241*  CKMB 5.1*  CKMBINDEX --  TROPONINI <0.30   No components found with this basename: POCBNP:3 No results found for this basename: DDIMER:2 in the last 72 hours  Basename 07/31/11 0515  HGBA1C 6.1*    Basename 07/31/11 0515  CHOL 181  HDL 48  LDLCALC 116*  TRIG 86  CHOLHDL 3.8  LDLDIRECT --   No results found for this basename: TSH,T4TOTAL,FREET3,T3FREE,THYROIDAB in the last 72 hours No results found for this basename: VITAMINB12:2,FOLATE:2,FERRITIN:2,TIBC:2,IRON:2,RETICCTPCT:2 in the last 72 hours  Micro  Results: No results found for this or any previous visit (from the past 240 hour(s)).  Studies/Results: Dg Chest 2 View  07/30/2011  *RADIOLOGY REPORT*  Clinical Data: Possible stroke.  CHEST - 2 VIEW  Comparison: 09/12/2010  Findings: Shallow inspiration.  Borderline heart size with normal pulmonary vascularity.  Scattered calcified granulomas and fibrosis in the lungs.  No focal airspace consolidation.  There is a vague 10 mm nodular opacity in the left upper lung which was not definitely present since the previous study.  This probably represents prominent vascular shadow.  Recommend follow-up with upright PA and lateral view for further evaluation. No blunting of costophrenic angles.  No pneumothorax.  Degenerative changes in the spine and shoulders.  IMPRESSION: No evidence of active pulmonary disease.  Nonspecific nodular opacity in the left upper lung.  Follow-up with upright PA and lateral view recommended to confirm.  Original Report Authenticated By: Marlon Pel, M.D.   Ct Head Wo Contrast  07/30/2011  *RADIOLOGY REPORT*  Clinical Data: 71 year old female right side weakness, slurred speech.  CT HEAD WITHOUT CONTRAST  Technique:  Contiguous axial images were obtained from the base of the skull through the vertex without contrast.  Comparison: 11/30/2009 and  earlier.  Findings: Visualized paranasal sinuses and mastoids are clear.  No acute osseous abnormality identified.  Visualized orbits and scalp soft tissues are within normal limits.  Cerebral volume is within normal limits for age.  No midline shift, ventriculomegaly, mass effect, evidence of mass lesion, intracranial hemorrhage or evidence of cortically based acute infarction.  Gray-white matter differentiation is within normal limits throughout the brain.  No suspicious intracranial vascular hyperdensity.  IMPRESSION: Stable and normal for age noncontrast CT of the brain.  Original Report Authenticated By: Harley Hallmark, M.D.   Mr  Maxine Glenn Head Wo Contrast  07/31/2011  *RADIOLOGY REPORT*  Clinical Data:  Right-sided weakness.  History of TIA and diabetes.  MRI HEAD WITHOUT CONTRAST MRA HEAD WITHOUT CONTRAST  Technique:  Multiplanar, multiecho pulse sequences of the brain and surrounding structures were obtained without intravenous contrast. Angiographic images of the head were obtained using MRA technique without contrast.  Comparison:  MRI brain 11/01/2009.  CT head without contrast 07/30/2011.  Both exams were performed at [facility].  MRI HEAD  Findings:  The diffusion weighted images demonstrate no evidence for acute or subacute infarction.  Minimal periventricular and mild scattered subcortical T2 and FLAIR hyperintensities bilaterally are stable from the prior MRI of the.  No hemorrhage or mass lesion is present.  Flow is present within the major intracranial arteries.  The globes and orbits are intact.  The paranasal sinuses and mastoid air cells are clear.  IMPRESSION:  1.  No acute intracranial abnormality or significant interval change. 2.  Stable scattered white matter disease.  This likely reflects the sequelae of chronic microvascular ischemia.  MRA HEAD  Findings: Mild irregularity within the cavernous carotid arteries bilaterally is stable.  There is no significant stenosis by NASCET criteria.  The A1 and M1 segments are within normal limits.  The [facility] and MCA branch vessels demonstrate minimal irregularity.  The right vertebral artery is slightly dominant to the left.  There is minimal irregularity in the basilar artery without significant stenosis.  Both posterior cerebral arteries originate from the basilar tip.  There is some attenuation of distal PCA branch vessels.  IMPRESSION:  1.  Mild small vessel disease. 2.  No significant proximal stenosis, aneurysm, or branch vessel occlusion.  Original Report Authenticated By: Jamesetta Orleans. MATTERN, M.D.   Mr Brain Wo Contrast  07/31/2011  *RADIOLOGY REPORT*  Clinical Data:   Right-sided weakness.  History of TIA and diabetes.  MRI HEAD WITHOUT CONTRAST MRA HEAD WITHOUT CONTRAST  Technique:  Multiplanar, multiecho pulse sequences of the brain and surrounding structures were obtained without intravenous contrast. Angiographic images of the head were obtained using MRA technique without contrast.  Comparison:  MRI brain 11/01/2009.  CT head without contrast 07/30/2011.  Both exams were performed at [facility].  MRI HEAD  Findings:  The diffusion weighted images demonstrate no evidence for acute or subacute infarction.  Minimal periventricular and mild scattered subcortical T2 and FLAIR hyperintensities bilaterally are stable from the prior MRI of the.  No hemorrhage or mass lesion is present.  Flow is present within the major intracranial arteries.  The globes and orbits are intact.  The paranasal sinuses and mastoid air cells are clear.  IMPRESSION:  1.  No acute intracranial abnormality or significant interval change. 2.  Stable scattered white matter disease.  This likely reflects the sequelae of chronic microvascular ischemia.  MRA HEAD  Findings: Mild irregularity within the cavernous carotid arteries bilaterally is stable.  There  is no significant stenosis by NASCET criteria.  The A1 and M1 segments are within normal limits.  The [facility] and MCA branch vessels demonstrate minimal irregularity.  The right vertebral artery is slightly dominant to the left.  There is minimal irregularity in the basilar artery without significant stenosis.  Both posterior cerebral arteries originate from the basilar tip.  There is some attenuation of distal PCA branch vessels.  IMPRESSION:  1.  Mild small vessel disease. 2.  No significant proximal stenosis, aneurysm, or branch vessel occlusion.  Original Report Authenticated By: Jamesetta Orleans. MATTERN, M.D.   Mr Cervical Spine W Wo Contrast  07/31/2011  *RADIOLOGY REPORT*  Clinical Data: Neck and low back pain.  MRI CERVICAL SPINE WITHOUT AND  WITH CONTRAST  Technique:  Multiplanar and multiecho pulse sequences of the cervical spine, to include the craniocervical junction and cervicothoracic junction, were obtained according to standard protocol without and with intravenous contrast.  Contrast: 16mL MULTIHANCE GADOBENATE DIMEGLUMINE 529 MG/ML IV SOLN  Comparison: 03/06/2007  Findings: The scan extends from above the top of the clivus through T1-2.  The visualized intracranial contents are normal.  Cervical spinal cord is normal with no mass lesion or myelopathy.  C1-2 and C2-3:  Normal.  C3-4:  Normal.  C4-5:  Severe left facet arthritis with a 2 mm spondylolisthesis of C4 on C5.  No neural impingement.  Tiny central disc bulge.  C5-6:  Slight disc space narrowing with slight broad-based osteophyte formation with mild bilateral foraminal stenosis.  No soft disc protrusion.  C6-7:  Small central disc protrusion without impingement.  C7-T1:  Tiny central disc bulge with no impingement.  T1-2:  Normal.  Note is made of an inhomogeneous enhancing nodule in the left lobe of the thyroid gland measuring 3 x 4 cm in size.  If this is not been previously evaluated, ultrasound may be useful for further evaluation.  No pathologic enhancement in the cervical spine after contrast administration.  The irregular nodule in the left lobe of the thyroid gland does enhance inhomogeneously.  IMPRESSION:  1. Moderately severe left facet arthritis at C4-5. 2.  Multilevel mild degenerative disc disease without neural impingement. 3.  3 x 4 cm inhomogeneous enhancing mass in the left lobe of the thyroid gland.  Further evaluation by ultrasound may be useful on an elective basis.  Original Report Authenticated By: Gwynn Burly, M.D.   Mr Lumbar Spine W Wo Contrast  07/31/2011  *RADIOLOGY REPORT*  Clinical Data: Right leg pain and loss of sensation in the right leg.  Radiculopathy.  MRI LUMBAR SPINE WITHOUT AND WITH CONTRAST  Technique:  Multiplanar and multiecho pulse  sequences of the lumbar spine were obtained without and with intravenous contrast.  Contrast: 16mL MULTIHANCE GADOBENATE DIMEGLUMINE 529 MG/ML IV SOLN  Comparison: MRI dated 02/13/2008  Findings: Scan extends from T12-L1 through S5.  Tip of the conus is at L2-3 and is normal other than being slightly low-lying.  T12-L1 through L1-2:  Normal.  L2-3:  Tiny diffuse disc bulge with no impingement.  L3-4:  Minimal bulge of the disc into the right lateral recess without significant neural impingement, unchanged.  L4-5:  Disc protrusion into the left neural foramen with moderate left facet arthritis and bilateral ligamentum flava hypertrophy causing mild to moderate spinal stenosis and left foraminal stenosis.  This has progressed only slightly since the prior study. There is a mass effect upon the left L4 nerve after it has exited the neural foramen but the patient  does not report left leg symptoms.  Mild right facet arthritis.  L5 S1:  Small disc protrusion into the right lateral recess with slight bulging to the left lateral recess.  Moderate bilateral facet arthritis.  Mild bilateral foraminal stenosis without focal neural impingement. Moderate spinal stenosis.  After contrast administration there is enhancement around the left facet joint at L4-5 and slight enhancement around the facet joints bilaterally at L5-S1.  IMPRESSION:  1. Mild to moderate spinal stenosis at L4-5 primarily due to ligamenta flava and facet joint hypertrophy.  Small protrusion into the left neural foramen. 2. Moderate spinal stenosis at L5-S1 due to ligamentum flava and facet joint hypertrophy.  Original Report Authenticated By: Gwynn Burly, M.D.    Medications: I have reviewed the patient's current medications. Scheduled Meds:   . sodium chloride   Intravenous STAT  . aspirin  325 mg Oral Daily  . clopidogrel  75 mg Oral Q breakfast  . enoxaparin  40 mg Subcutaneous Q24H  . gabapentin  400 mg Oral BID  . pantoprazole  40 mg Oral  Q1200  . predniSONE  5 mg Oral Q breakfast  . rosuvastatin  10 mg Oral Daily  . DISCONTD: aspirin  324 mg Oral Daily   Continuous Infusions:  PRN Meds:.gadobenate dimeglumine, ondansetron (ZOFRAN) IV, senna-docusate  Assessment/Plan: 1. Question of transient ischemic attack.  MRI/MRA of the brain done with results as indicated above.  Carotid Dopplers show left 40-59% internal carotid artery stenosis which is unchanged from previously.  2-D echocardiogram is pending, patient is unsure if she has had echocardiogram over the last year.  PT/OT evaluation showed no needs.  2.  Leg pain bilaterally.  MRI of the C-spine and L-spine done with results as indicated above.  Improved, PT evaluated the patient and saw no needs.  3.  Left lobe thyroid gland 3 x 4 cm mass.  TSH and free T4 pending.  Instructed the patient followup with her primary care for further outpatient evaluation.  4.  Hyperlipidemia.  LDL 116.  Not under good control increase the dose of rosuvastatin to 20 mg a day.  5.  History of GERD.  Continue PPI.  6.  Disposition.  Consider discharge tomorrow based on 2-D echocardiogram results and TSH/free T4.   LOS: 1 day  Rumi Taras A, MD 07/31/2011, 6:43 PM

## 2011-07-31 NOTE — Progress Notes (Signed)
Physical Therapy Evaluation Patient Details Name: [name] MRN: [id number] DOB: [birth year] Today's Date: 07/31/2011 Time: 8295-6213  Eval II Problem List:  Patient Active Problem List  Diagnoses  . HYPERLIPIDEMIA  . ANXIETY  . DEPRESSION  . PERIPHERAL NEUROPATHY  . CAROTID ARTERY DISEASE  . TIA  . PERIPHERAL VASCULAR DISEASE  . GERD  . BARRETTS ESOPHAGUS  . CONSTIPATION, CHRONIC  . URETHRAL STRICTURE  . WRIST PAIN, BILATERAL  . DISC DISEASE, CERVICAL  . DISC DISEASE, LUMBAR  . LOW BACK PAIN, CHRONIC  . LEG PAIN, BILATERAL  . POSITIVE PPD  . TRANSIENT ISCHEMIC ATTACK, HX OF  . COLONIC POLYPS, HX OF  . VOCAL CORD POLYP, HX OF  . Impaired glucose tolerance  . Preventative health care  . Lumbar pain with radiation down right leg  . Rash    Past Medical History:  Past Medical History  Diagnosis Date  . Impaired glucose tolerance 01/14/2011  . Acute sinus infection 01/15/2011  . ANXIETY 10/03/2009  . BARRETTS ESOPHAGUS 10/03/2009  . CAROTID ARTERY DISEASE 08/31/2009  . COLONIC POLYPS, HX OF 08/29/2009  . CONSTIPATION, CHRONIC 10/03/2009  . DEPRESSION 10/03/2009  . DISC DISEASE, CERVICAL 08/29/2009  . DISC DISEASE, LUMBAR 08/29/2009  . Eustachian tube dysfunction 01/15/2011  . GERD 08/29/2009  . HYPERLIPIDEMIA 08/29/2009  . LEG PAIN, BILATERAL 08/29/2009  . LOW BACK PAIN, CHRONIC 11/22/2009  . PERIPHERAL NEUROPATHY 10/03/2009  . PERIPHERAL VASCULAR DISEASE 08/29/2009  . POSITIVE PPD 10/03/2009  . Preventative health care 01/14/2011  . TIA 11/22/2009  . TRANSIENT ISCHEMIC ATTACK, HX OF 08/29/2009  . URETHRAL STRICTURE 10/03/2009  . VOCAL CORD POLYP, HX OF 10/03/2009  . WRIST PAIN, BILATERAL 05/25/2010   Past Surgical History:  Past Surgical History  Procedure Date  . S/p vocal cord polyps 1984    chronic hoarseness  . Tubal ligation   . Stress test negative 2003,2004,2006  . Hx of right ankle fracture     PT Assessment/Plan/Recommendation PT Assessment Clinical Impression  Statement: Pt presents with diagnosis of TIA? and hyperlipidemia. Pt currently demonstrates some gait and balance deficits. Pt will benefit from skilled PT in the acute care setting to maximize independence and safety, and to improve gait and balance in preperation for D/c home with family. Discussed follow-up PT-pt's daughter stated she was a CNA and she could perform exercises with pt at home instead of having HHPT come out. Explained and demonstrated exercises and issued handout. Recommended pt perform exercises with supervison and external support.  PT Recommendation/Assessment: Patient will need skilled PT in the acute care venue PT Problem List: Decreased balance;Decreased mobility Barriers to Discharge Comments: Pt's daughter present and stated she could assist pt at home and that she lives there as well. 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 PT Therapy Diagnosis : Difficulty walking;Abnormality of gait PT Plan PT Frequency: Min 3X/week PT Treatment/Interventions: Gait training;Balance training;Functional mobility training;Patient/family education;Stair training PT Recommendation Follow Up Recommendations: None Equipment Recommended: None recommended by PT PT Goals  Acute Rehab PT Goals PT Goal Formulation: With patient Pt will Ambulate: >150 feet;with supervision;with least restrictive assistive device PT Goal: Ambulate - Progress: Not met Pt will Go Up / Down Stairs: 3-5 stairs;with supervision;with rail(s) PT Goal: Up/Down Stairs - Progress: Not met Additional Goals Additional Goal #1: Pt will demonstrate fair balance(no assist or external support needed) during functional tasks. PT Goal: Additional Goal #1 - Progress: Not met  PT Evaluation Precautions/Restrictions  Precautions Precautions: Fall Prior Functioning  Home Living  Lives With: Spouse;Other (Comment) (granddaughter. 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.) Receives Help From: Family Type of Home: House Home Layout:  One level Home Access: Stairs to enter Entrance Stairs-Rails: Can reach both;Left;Right Entrance Stairs-Number of Steps: 3 Bathroom Shower/Tub: Engineer, manufacturing systems: Standard Home Adaptive Equipment: Bedside commode/3-in-1;Walker - rolling Prior Function Level of Independence: Independent with basic ADLs;Independent with homemaking with ambulation;Independent with gait;Independent with transfers Driving: Yes Vocation: Retired Financial risk analyst Arousal/Alertness: Awake/alert Orientation Level: Oriented X4 Sensation/Coordination Sensation Light Touch: Appears Intact Coordination Gross Motor Movements are Fluid and Coordinated: Yes Extremity Assessment RLE Assessment RLE Assessment: Within Functional Limits LLE Assessment LLE Assessment: Within Functional Limits Mobility (including Balance) Bed Mobility Bed Mobility: Yes Supine to Sit: 7: Independent Sit to Supine - Left: 7: Independent Transfers Transfers: Yes Sit to Stand: 6: Modified independent (Device/Increase time) Stand to Sit: 6: Modified independent (Device/Increase time) Ambulation/Gait Ambulation/Gait: Yes Ambulation/Gait Assistance: 4: Min assist Ambulation/Gait Assistance Details (indicate cue type and reason): 1 instance of LOB requiring external assist (Min) to correct. Otherwise, pt was Min-guard assist. Ambulation Distance (Feet): 400 Feet Assistive device: None Gait Pattern: Step-through pattern  Balance Balance Assessed: Yes Static Standing Balance Static Standing - Comment/# of Minutes: Had pt peform static standing EO/EC, static standing with external perturbations, narrow BOS-all without difficulty/LOB. SLS without external support-pt able to hold for 3 seconds before putting foot down-fair balance reaction. Dynamic Standing Balance Dynamic Standing - Comments: Had pt perform 360 degree turn (R and L), P/U object from floor-without difficulty or LOB High Level Balance High Level Balance  Activites: Turns;Sudden stops;Direction changes;Backward walking- noLOB High Level Balance Comments: Pt tends to move cautiously/timidly with little to no armswing at times. Exercise    End of Session PT - End of Session Equipment Utilized During Treatment: Gait belt Activity Tolerance: Patient tolerated treatment well Patient left: in bed;with call bell in reach;with family/visitor present General Behavior During Session: [facility] for tasks performed Cognition: [facility] for tasks performed  Rebeca Alert [facility] 07/31/2011, 2:17 PM [phone]

## 2011-07-31 NOTE — Progress Notes (Signed)
VASCULAR LAB PRELIMINARY  PRELIMINARY  PRELIMINARY  PRELIMINARY  Carotid duplex  completed.    Preliminary report:  Right:  No evidence of hemodynamically significant internal carotid artery stenosis.  Left:  40-59% internal carotid artery stenosis.  Bilateral:  Vertebral artery flow is antegrade.  No significant change since study of 06-01-11 at [facility].  Terance Hart, RVT 07/31/2011, 9:01 AM

## 2011-07-31 NOTE — Progress Notes (Signed)
Occupational Therapy Evaluation Patient Details Name: [name] MRN: [id number] DOB: 31-May-1941 Today's Date: 07/31/2011 [id number] Problem List:  Patient Active Problem List  Diagnoses  . HYPERLIPIDEMIA  . ANXIETY  . DEPRESSION  . PERIPHERAL NEUROPATHY  . CAROTID ARTERY DISEASE  . TIA  . PERIPHERAL VASCULAR DISEASE  . GERD  . BARRETTS ESOPHAGUS  . CONSTIPATION, CHRONIC  . URETHRAL STRICTURE  . WRIST PAIN, BILATERAL  . DISC DISEASE, CERVICAL  . DISC DISEASE, LUMBAR  . LOW BACK PAIN, CHRONIC  . LEG PAIN, BILATERAL  . POSITIVE PPD  . TRANSIENT ISCHEMIC ATTACK, HX OF  . COLONIC POLYPS, HX OF  . VOCAL CORD POLYP, HX OF  . Impaired glucose tolerance  . Preventative health care  . Lumbar pain with radiation down right leg  . Rash    Past Medical History:  Past Medical History  Diagnosis Date  . Impaired glucose tolerance 01/14/2011  . Acute sinus infection 01/15/2011  . ANXIETY 10/03/2009  . BARRETTS ESOPHAGUS 10/03/2009  . CAROTID ARTERY DISEASE 08/31/2009  . COLONIC POLYPS, HX OF 08/29/2009  . CONSTIPATION, CHRONIC 10/03/2009  . DEPRESSION 10/03/2009  . DISC DISEASE, CERVICAL 08/29/2009  . DISC DISEASE, LUMBAR 08/29/2009  . Eustachian tube dysfunction 01/15/2011  . GERD 08/29/2009  . HYPERLIPIDEMIA 08/29/2009  . LEG PAIN, BILATERAL 08/29/2009  . LOW BACK PAIN, CHRONIC 11/22/2009  . PERIPHERAL NEUROPATHY 10/03/2009  . PERIPHERAL VASCULAR DISEASE 08/29/2009  . POSITIVE PPD 10/03/2009  . Preventative health care 01/14/2011  . TIA 11/22/2009  . TRANSIENT ISCHEMIC ATTACK, HX OF 08/29/2009  . URETHRAL STRICTURE 10/03/2009  . VOCAL CORD POLYP, HX OF 10/03/2009  . WRIST PAIN, BILATERAL 05/25/2010   Past Surgical History:  Past Surgical History  Procedure Date  . S/p vocal cord polyps 1984    chronic hoarseness  . Tubal ligation   . Stress test negative 2003,2004,2006  . Hx of right ankle fracture     OT Assessment/Plan/Recommendation OT Assessment Clinical Impression Statement: Pt  is I/mod I for all ADLs. No f/u OT needed. Pt does present w/some dynamic standing deficits and may benefit from use of AD when ambulating. PT still to assess. OT Recommendation/Assessment: Patient does not need any further OT services OT Goals    OT Evaluation Precautions/Restrictions    Prior Functioning Home Living Lives With: Spouse;Other (Comment) (granddaughter. 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.) Receives Help From: Family Type of Home: House Home Layout: One level Home Access: Stairs to enter Entrance Stairs-Rails: Can reach both;Left;Right Entrance Stairs-Number of Steps: 3 Bathroom Shower/Tub: Engineer, manufacturing systems: Standard Home Adaptive Equipment: Bedside commode/3-in-1;Walker - rolling Prior Function Level of Independence: Independent with basic ADLs;Independent with homemaking with ambulation;Independent with gait;Independent with transfers Driving: Yes Vocation: Retired ADL ADL Eating/Feeding: Performed;Independent Where Assessed - Eating/Feeding: Chair Grooming: Simulated;Modified independent Where Assessed - Grooming: Standing at sink Upper Body Bathing: Simulated;Modified independent Where Assessed - Upper Body Bathing: Standing at sink Lower Body Bathing: Simulated;Modified independent Where Assessed - Lower Body Bathing: Sit to stand from chair Upper Body Dressing: Simulated;Modified independent Where Assessed - Upper Body Dressing: Standing Lower Body Dressing: Performed;Modified independent Where Assessed - Lower Body Dressing: Sit to stand from chair Toilet Transfer: Performed;Independent Toilet Transfer Method: Proofreader: Regular height toilet Toileting - Clothing Manipulation: Simulated;Independent Where Assessed - Toileting Clothing Manipulation: Sit to stand from 3-in-1 or toilet Toileting - Hygiene: Simulated;Independent Where Assessed - Toileting Hygiene: Sit to stand from 3-in-1 or  toilet Tub/Shower Transfer: Simulated;Modified  independent Tub/Shower Transfer Method: Ambulating ADL Comments: Pt requires increased time & RBs with bathing and dressing which she states is normal for her. Vision/Perception  Vision - History Baseline Vision: Wears glasses only for reading Patient Visual Report: No change from baseline Vision - Assessment Eye Alignment: Within Functional Limits Vision Assessment: Vision tested Ocular Range of Motion: Within Functional Limits Tracking/Visual Pursuits: Able to track stimulus in all quads without difficulty Saccades: Within functional limits Convergence: Within functional limits  Cognition Cognition Arousal/Alertness: Awake/alert Overall Cognitive Status: Appears within functional limits for tasks assessed Orientation Level: Oriented X4 Cognition - Other Comments: Pt does present with slightly slurred speech and slight L facial droop. Sensation/Coordination Sensation Light Touch: Appears Intact Stereognosis: Appears Intact Hot/Cold: Appears Intact Proprioception: Appears Intact Extremity Assessment RUE Assessment RUE Assessment: Within Functional Limits LUE Assessment LUE Assessment: Within Functional Limits (No unilateral weakness noted.) Mobility  Bed Mobility Bed Mobility: No Transfers Transfers: Yes Sit to Stand: 6: Modified independent (Device/Increase time);From chair/3-in-1;From toilet;With upper extremity assist Stand to Sit: 7: Independent;With upper extremity assist;With armrests;To chair/3-in-1;To toilet Exercises   End of Session OT - End of Session Activity Tolerance: Patient tolerated treatment well Patient left: in chair;with call bell in reach General Behavior During Session: [facility] for tasks performed Cognition: [facility] for tasks performed   Tabor Bartram [phone] 07/31/2011, 9:37 AM

## 2011-08-01 DIAGNOSIS — I517 Cardiomegaly: Secondary | ICD-10-CM

## 2011-08-01 LAB — T4, FREE: Free T4: 1.16 ng/dL (ref 0.80–1.80)

## 2011-08-01 LAB — TSH: TSH: 0.4 u[IU]/mL (ref 0.350–4.500)

## 2011-08-01 NOTE — Progress Notes (Signed)
Echocardiogram 2D Echocardiogram has been performed.  [name] 08/01/2011, 3:02 PM

## 2011-08-01 NOTE — Progress Notes (Signed)
Subjective: No new issues.  Objective: Vital signs in last 24 hours: Temp:  [97.5 F (36.4 C)-98.5 F (36.9 C)] 98 F (36.7 C) (01/02 1402) Pulse Rate:  [63-68] 68  (01/02 1402) Resp:  [16-18] 18  (01/02 1402) BP: (97-119)/(57-72) 108/64 mmHg (01/02 1402) SpO2:  [96 %-99 %] 96 % (01/02 1402) Weight change:  Last BM Date: 07/30/11  Intake/Output from previous day: 01/01 0701 - 01/02 0700 In: 240 [P.O.:240] Out: 350 [Urine:350] Total I/O In: 960 [P.O.:960] Out: -    Physical Exam: General: Comfortable, alert, communicative, fully oriented, not short of breath at rest.  HEENT:  No clinical pallor, no jaundice, no conjunctival injection or discharge. Hydration is fair. NECK:  Supple, JVP not seen, no carotid bruits, no palpable lymphadenopathy, no palpable goiter. CHEST:  Clinically clear to auscultation, no wheezes, no crackles. HEART:  Sounds 1 and 2 heard, normal, regular, no murmurs. ABDOMEN:  Full, soft, non-tender, no palpable organomegaly, no palpable masses, normal bowel sounds. GENITALIA:  Not examined. LOWER EXTREMITIES:  No pitting edema, palpable peripheral pulses. MUSCULOSKELETAL SYSTEM:  Generalized osteoarthritic changes.. CENTRAL NERVOUS SYSTEM:  No focal neurologic deficit on gross examination.  Lab Results:  Basename 07/30/11 1547 07/30/11 1536  WBC -- 4.6  HGB 15.3* 14.9  HCT 45.0 45.6  PLT -- 149*    Basename 07/30/11 1547 07/30/11 1536  NA 144 140  K 3.7 3.7  CL 107 105  CO2 -- 27  GLUCOSE 90 89  BUN 7 9  CREATININE 0.90 0.82  CALCIUM -- 9.3   No results found for this or any previous visit (from the past 240 hour(s)).   Studies/Results: Dg Chest 2 View  07/30/2011  *RADIOLOGY REPORT*  Clinical Data: Possible stroke.  CHEST - 2 VIEW  Comparison: 09/12/2010  Findings: Shallow inspiration.  Borderline heart size with normal pulmonary vascularity.  Scattered calcified granulomas and fibrosis in the lungs.  No focal airspace consolidation.   There is a vague 10 mm nodular opacity in the left upper lung which was not definitely present since the previous study.  This probably represents prominent vascular shadow.  Recommend follow-up with upright PA and lateral view for further evaluation. No blunting of costophrenic angles.  No pneumothorax.  Degenerative changes in the spine and shoulders.  IMPRESSION: No evidence of active pulmonary disease.  Nonspecific nodular opacity in the left upper lung.  Follow-up with upright PA and lateral view recommended to confirm.  Original Report Authenticated By: Marlon Pel, M.D.   Ct Head Wo Contrast  07/30/2011  *RADIOLOGY REPORT*  Clinical Data: 71 year old female right side weakness, slurred speech.  CT HEAD WITHOUT CONTRAST  Technique:  Contiguous axial images were obtained from the base of the skull through the vertex without contrast.  Comparison: 11/30/2009 and earlier.  Findings: Visualized paranasal sinuses and mastoids are clear.  No acute osseous abnormality identified.  Visualized orbits and scalp soft tissues are within normal limits.  Cerebral volume is within normal limits for age.  No midline shift, ventriculomegaly, mass effect, evidence of mass lesion, intracranial hemorrhage or evidence of cortically based acute infarction.  Gray-white matter differentiation is within normal limits throughout the brain.  No suspicious intracranial vascular hyperdensity.  IMPRESSION: Stable and normal for age noncontrast CT of the brain.  Original Report Authenticated By: Harley Hallmark, M.D.   Mr Maxine Glenn Head Wo Contrast  07/31/2011  *RADIOLOGY REPORT*  Clinical Data:  Right-sided weakness.  History of TIA and diabetes.  MRI HEAD WITHOUT CONTRAST  MRA HEAD WITHOUT CONTRAST  Technique:  Multiplanar, multiecho pulse sequences of the brain and surrounding structures were obtained without intravenous contrast. Angiographic images of the head were obtained using MRA technique without contrast.  Comparison:  MRI  brain 11/01/2009.  CT head without contrast 07/30/2011.  Both exams were performed at [facility].  MRI HEAD  Findings:  The diffusion weighted images demonstrate no evidence for acute or subacute infarction.  Minimal periventricular and mild scattered subcortical T2 and FLAIR hyperintensities bilaterally are stable from the prior MRI of the.  No hemorrhage or mass lesion is present.  Flow is present within the major intracranial arteries.  The globes and orbits are intact.  The paranasal sinuses and mastoid air cells are clear.  IMPRESSION:  1.  No acute intracranial abnormality or significant interval change. 2.  Stable scattered white matter disease.  This likely reflects the sequelae of chronic microvascular ischemia.  MRA HEAD  Findings: Mild irregularity within the cavernous carotid arteries bilaterally is stable.  There is no significant stenosis by NASCET criteria.  The A1 and M1 segments are within normal limits.  The [facility] and MCA branch vessels demonstrate minimal irregularity.  The right vertebral artery is slightly dominant to the left.  There is minimal irregularity in the basilar artery without significant stenosis.  Both posterior cerebral arteries originate from the basilar tip.  There is some attenuation of distal PCA branch vessels.  IMPRESSION:  1.  Mild small vessel disease. 2.  No significant proximal stenosis, aneurysm, or branch vessel occlusion.  Original Report Authenticated By: Jamesetta Orleans. MATTERN, M.D.   Mr Brain Wo Contrast  07/31/2011  *RADIOLOGY REPORT*  Clinical Data:  Right-sided weakness.  History of TIA and diabetes.  MRI HEAD WITHOUT CONTRAST MRA HEAD WITHOUT CONTRAST  Technique:  Multiplanar, multiecho pulse sequences of the brain and surrounding structures were obtained without intravenous contrast. Angiographic images of the head were obtained using MRA technique without contrast.  Comparison:  MRI brain 11/01/2009.  CT head without contrast 07/30/2011.  Both exams  were performed at [facility].  MRI HEAD  Findings:  The diffusion weighted images demonstrate no evidence for acute or subacute infarction.  Minimal periventricular and mild scattered subcortical T2 and FLAIR hyperintensities bilaterally are stable from the prior MRI of the.  No hemorrhage or mass lesion is present.  Flow is present within the major intracranial arteries.  The globes and orbits are intact.  The paranasal sinuses and mastoid air cells are clear.  IMPRESSION:  1.  No acute intracranial abnormality or significant interval change. 2.  Stable scattered white matter disease.  This likely reflects the sequelae of chronic microvascular ischemia.  MRA HEAD  Findings: Mild irregularity within the cavernous carotid arteries bilaterally is stable.  There is no significant stenosis by NASCET criteria.  The A1 and M1 segments are within normal limits.  The [facility] and MCA branch vessels demonstrate minimal irregularity.  The right vertebral artery is slightly dominant to the left.  There is minimal irregularity in the basilar artery without significant stenosis.  Both posterior cerebral arteries originate from the basilar tip.  There is some attenuation of distal PCA branch vessels.  IMPRESSION:  1.  Mild small vessel disease. 2.  No significant proximal stenosis, aneurysm, or branch vessel occlusion.  Original Report Authenticated By: Jamesetta Orleans. MATTERN, M.D.   Mr Cervical Spine W Wo Contrast  07/31/2011  *RADIOLOGY REPORT*  Clinical Data: Neck and low back pain.  MRI CERVICAL  SPINE WITHOUT AND WITH CONTRAST  Technique:  Multiplanar and multiecho pulse sequences of the cervical spine, to include the craniocervical junction and cervicothoracic junction, were obtained according to standard protocol without and with intravenous contrast.  Contrast: 16mL MULTIHANCE GADOBENATE DIMEGLUMINE 529 MG/ML IV SOLN  Comparison: 03/06/2007  Findings: The scan extends from above the top of the clivus through T1-2.   The visualized intracranial contents are normal.  Cervical spinal cord is normal with no mass lesion or myelopathy.  C1-2 and C2-3:  Normal.  C3-4:  Normal.  C4-5:  Severe left facet arthritis with a 2 mm spondylolisthesis of C4 on C5.  No neural impingement.  Tiny central disc bulge.  C5-6:  Slight disc space narrowing with slight broad-based osteophyte formation with mild bilateral foraminal stenosis.  No soft disc protrusion.  C6-7:  Small central disc protrusion without impingement.  C7-T1:  Tiny central disc bulge with no impingement.  T1-2:  Normal.  Note is made of an inhomogeneous enhancing nodule in the left lobe of the thyroid gland measuring 3 x 4 cm in size.  If this is not been previously evaluated, ultrasound may be useful for further evaluation.  No pathologic enhancement in the cervical spine after contrast administration.  The irregular nodule in the left lobe of the thyroid gland does enhance inhomogeneously.  IMPRESSION:  1. Moderately severe left facet arthritis at C4-5. 2.  Multilevel mild degenerative disc disease without neural impingement. 3.  3 x 4 cm inhomogeneous enhancing mass in the left lobe of the thyroid gland.  Further evaluation by ultrasound may be useful on an elective basis.  Original Report Authenticated By: Gwynn Burly, M.D.   Mr Lumbar Spine W Wo Contrast  07/31/2011  *RADIOLOGY REPORT*  Clinical Data: Right leg pain and loss of sensation in the right leg.  Radiculopathy.  MRI LUMBAR SPINE WITHOUT AND WITH CONTRAST  Technique:  Multiplanar and multiecho pulse sequences of the lumbar spine were obtained without and with intravenous contrast.  Contrast: 16mL MULTIHANCE GADOBENATE DIMEGLUMINE 529 MG/ML IV SOLN  Comparison: MRI dated 02/13/2008  Findings: Scan extends from T12-L1 through S5.  Tip of the conus is at L2-3 and is normal other than being slightly low-lying.  T12-L1 through L1-2:  Normal.  L2-3:  Tiny diffuse disc bulge with no impingement.  L3-4:  Minimal bulge  of the disc into the right lateral recess without significant neural impingement, unchanged.  L4-5:  Disc protrusion into the left neural foramen with moderate left facet arthritis and bilateral ligamentum flava hypertrophy causing mild to moderate spinal stenosis and left foraminal stenosis.  This has progressed only slightly since the prior study. There is a mass effect upon the left L4 nerve after it has exited the neural foramen but the patient does not report left leg symptoms.  Mild right facet arthritis.  L5 S1:  Small disc protrusion into the right lateral recess with slight bulging to the left lateral recess.  Moderate bilateral facet arthritis.  Mild bilateral foraminal stenosis without focal neural impingement. Moderate spinal stenosis.  After contrast administration there is enhancement around the left facet joint at L4-5 and slight enhancement around the facet joints bilaterally at L5-S1.  IMPRESSION:  1. Mild to moderate spinal stenosis at L4-5 primarily due to ligamenta flava and facet joint hypertrophy.  Small protrusion into the left neural foramen. 2. Moderate spinal stenosis at L5-S1 due to ligamentum flava and facet joint hypertrophy.  Original Report Authenticated By: Gwynn Burly, M.D.  Medications: Scheduled Meds:   . aspirin  325 mg Oral Daily  . clopidogrel  75 mg Oral Q breakfast  . enoxaparin  40 mg Subcutaneous Q24H  . gabapentin  400 mg Oral BID  . pantoprazole  40 mg Oral Q1200  . predniSONE  5 mg Oral Q breakfast  . rosuvastatin  20 mg Oral QHS  . DISCONTD: rosuvastatin  10 mg Oral Daily   Continuous Infusions:  PRN Meds:.ondansetron (ZOFRAN) IV, senna-docusate  Assessment/Plan: 1. Question of transient ischemic attack: MRI/MRA of the brain showed no acute intracranial findings of proximal stenoses.. Carotid Doppler showed left 40-59% internal carotid artery stenosis which is unchanged from previously. 2-D echocardiogram report  is pending. PT/OT evaluation  showed no needs.  2. Bilateral lower extremity pain: MRI of the C-spine and L-spine showed DJD, with out significant neurologic compromise. Patient has symptomatically improved. PT evaluated the patient and saw no acute needs.  3. Left lobe thyroid gland 3 x 4 cm mass: This is an incidental finding on imaging studies. TSH and free T4 are within normal limits. Will attempt to get IR to do U/S-Guided biopsy.  4. Hyperlipidemia: LDL 116. Dose of Rosuvastatin to 20 mg a day.  5. History of GERD: Asymptomatic on PPI.    Disposition: Consider discharge on 08/01/10.     LOS: 2 days   Elkin Belfield,CHRISTOPHER 08/01/2011, 3:58 PM

## 2011-08-02 ENCOUNTER — Other Ambulatory Visit: Payer: Self-pay | Admitting: Internal Medicine

## 2011-08-02 DIAGNOSIS — E041 Nontoxic single thyroid nodule: Secondary | ICD-10-CM

## 2011-08-02 NOTE — Progress Notes (Signed)
Speech Language/Pathology  Pt. Seen for speech-language-cognitive screen.  Language and cognition appear WFL's.  Has mild left labial/facial weakness that pt. States is premorbid from [place] last year.  She report there have been no changes in speech. No speech therapy needed at this time.  Educated pt. On outpatient ST services/full assessment can be ordered from her GP if she has any difficulty once at home.  Thank you.  Breck Coons [place].Ed [employer] [phone]  08/02/2011

## 2011-08-02 NOTE — Progress Notes (Signed)
Thyroid biopsy requested for further evaluation of nodule. Discussed with patient and Dr Bonnielee Haff. Patient on Plavix for carotid disease and possible TIAs. Would need to be off Plavix for 5 days prior to biopsy. Biopsy can be arranged on an outpatient basis by calling Tammy at [facility] [phone] provided patient can come off Plavix. Thanks.

## 2011-08-02 NOTE — Discharge Summary (Signed)
Physician Discharge Summary  Patient ID: [name] MRN: [id number] DOB/AGE: 1940/11/25 71 y.o.  Admit date: 07/30/2011 Discharge date: 08/02/2011  Primary Care Physician:  Oliver Barre, MD, MD   Discharge Diagnoses:    Patient Active Problem List  Diagnoses  . HYPERLIPIDEMIA  . ANXIETY  . DEPRESSION  . PERIPHERAL NEUROPATHY  . CAROTID ARTERY DISEASE  . TIA  . PERIPHERAL VASCULAR DISEASE  . GERD  . BARRETTS ESOPHAGUS  . CONSTIPATION, CHRONIC  . URETHRAL STRICTURE  . WRIST PAIN, BILATERAL  . DISC DISEASE, CERVICAL  . DISC DISEASE, LUMBAR  . LOW BACK PAIN, CHRONIC  . LEG PAIN, BILATERAL  . POSITIVE PPD  . TRANSIENT ISCHEMIC ATTACK, HX OF  . COLONIC POLYPS, HX OF  . VOCAL CORD POLYP, HX OF  . Impaired glucose tolerance  . Preventative health care  . Lumbar pain with radiation down right leg  . Rash    Current Discharge Medication List    CONTINUE these medications which have NOT CHANGED   Details  aspirin 325 MG tablet Take 325 mg by mouth daily.      CRESTOR 20 MG tablet TAKE ONE TABLET BY MOUTH EVERY DAY Qty: 90 each, Refills: 3    gabapentin (NEURONTIN) 600 MG tablet Take 1 tablet (600 mg total) by mouth 3 (three) times daily as needed. Qty: 90 tablet, Refills: 5   Associated Diagnoses: Lumbar pain with radiation down right leg    Multiple Vitamin (MULTIVITAMIN) capsule Take 1 capsule by mouth daily.      omeprazole (PRILOSEC) 20 MG capsule TAKE TWO CAPSULES BY MOUTH EVERY DAY Qty: 180 capsule, Refills: 3    PLAVIX 75 MG tablet TAKE ONE TABLET BY MOUTH EVERY DAY Qty: 90 each, Refills: 1    triamcinolone (KENALOG) 0.1 % cream Apply topically 2 (two) times daily. Qty: 30 g, Refills: 0   Associated Diagnoses: Rash      STOP taking these medications     predniSONE (DELTASONE) 10 MG tablet          Disposition and Follow-up:  Follow up routinely with primary MD.  Consults:  none  None.  Significant Diagnostic Studies:  Dg Chest 2  View  07/30/2011  *RADIOLOGY REPORT*  Clinical Data: Possible stroke.  CHEST - 2 VIEW  Comparison: 09/12/2010  Findings: Shallow inspiration.  Borderline heart size with normal pulmonary vascularity.  Scattered calcified granulomas and fibrosis in the lungs.  No focal airspace consolidation.  There is a vague 10 mm nodular opacity in the left upper lung which was not definitely present since the previous study.  This probably represents prominent vascular shadow.  Recommend follow-up with upright PA and lateral view for further evaluation. No blunting of costophrenic angles.  No pneumothorax.  Degenerative changes in the spine and shoulders.  IMPRESSION: No evidence of active pulmonary disease.  Nonspecific nodular opacity in the left upper lung.  Follow-up with upright PA and lateral view recommended to confirm.  Original Report Authenticated By: Marlon Pel, M.D.   Ct Head Wo Contrast  07/30/2011  *RADIOLOGY REPORT*  Clinical Data: 71 year old female right side weakness, slurred speech.  CT HEAD WITHOUT CONTRAST  Technique:  Contiguous axial images were obtained from the base of the skull through the vertex without contrast.  Comparison: 11/30/2009 and earlier.  Findings: Visualized paranasal sinuses and mastoids are clear.  No acute osseous abnormality identified.  Visualized orbits and scalp soft tissues are within normal limits.  Cerebral volume is within normal limits  for age.  No midline shift, ventriculomegaly, mass effect, evidence of mass lesion, intracranial hemorrhage or evidence of cortically based acute infarction.  Gray-white matter differentiation is within normal limits throughout the brain.  No suspicious intracranial vascular hyperdensity.  IMPRESSION: Stable and normal for age noncontrast CT of the brain.  Original Report Authenticated By: Harley Hallmark, M.D.   Mr Maxine Glenn Head Wo Contrast  07/31/2011  *RADIOLOGY REPORT*  Clinical Data:  Right-sided weakness.  History of TIA and  diabetes.  MRI HEAD WITHOUT CONTRAST MRA HEAD WITHOUT CONTRAST  Technique:  Multiplanar, multiecho pulse sequences of the brain and surrounding structures were obtained without intravenous contrast. Angiographic images of the head were obtained using MRA technique without contrast.  Comparison:  MRI brain 11/01/2009.  CT head without contrast 07/30/2011.  Both exams were performed at [facility].  MRI HEAD  Findings:  The diffusion weighted images demonstrate no evidence for acute or subacute infarction.  Minimal periventricular and mild scattered subcortical T2 and FLAIR hyperintensities bilaterally are stable from the prior MRI of the.  No hemorrhage or mass lesion is present.  Flow is present within the major intracranial arteries.  The globes and orbits are intact.  The paranasal sinuses and mastoid air cells are clear.  IMPRESSION:  1.  No acute intracranial abnormality or significant interval change. 2.  Stable scattered white matter disease.  This likely reflects the sequelae of chronic microvascular ischemia.  MRA HEAD  Findings: Mild irregularity within the cavernous carotid arteries bilaterally is stable.  There is no significant stenosis by NASCET criteria.  The A1 and M1 segments are within normal limits.  The [facility] and MCA branch vessels demonstrate minimal irregularity.  The right vertebral artery is slightly dominant to the left.  There is minimal irregularity in the basilar artery without significant stenosis.  Both posterior cerebral arteries originate from the basilar tip.  There is some attenuation of distal PCA branch vessels.  IMPRESSION:  1.  Mild small vessel disease. 2.  No significant proximal stenosis, aneurysm, or branch vessel occlusion.  Original Report Authenticated By: Jamesetta Orleans. MATTERN, M.D.   Mr Brain Wo Contrast  07/31/2011  *RADIOLOGY REPORT*  Clinical Data:  Right-sided weakness.  History of TIA and diabetes.  MRI HEAD WITHOUT CONTRAST MRA HEAD WITHOUT CONTRAST   Technique:  Multiplanar, multiecho pulse sequences of the brain and surrounding structures were obtained without intravenous contrast. Angiographic images of the head were obtained using MRA technique without contrast.  Comparison:  MRI brain 11/01/2009.  CT head without contrast 07/30/2011.  Both exams were performed at [facility].  MRI HEAD  Findings:  The diffusion weighted images demonstrate no evidence for acute or subacute infarction.  Minimal periventricular and mild scattered subcortical T2 and FLAIR hyperintensities bilaterally are stable from the prior MRI of the.  No hemorrhage or mass lesion is present.  Flow is present within the major intracranial arteries.  The globes and orbits are intact.  The paranasal sinuses and mastoid air cells are clear.  IMPRESSION:  1.  No acute intracranial abnormality or significant interval change. 2.  Stable scattered white matter disease.  This likely reflects the sequelae of chronic microvascular ischemia.  MRA HEAD  Findings: Mild irregularity within the cavernous carotid arteries bilaterally is stable.  There is no significant stenosis by NASCET criteria.  The A1 and M1 segments are within normal limits.  The [facility] and MCA branch vessels demonstrate minimal irregularity.  The right vertebral artery is slightly  dominant to the left.  There is minimal irregularity in the basilar artery without significant stenosis.  Both posterior cerebral arteries originate from the basilar tip.  There is some attenuation of distal PCA branch vessels.  IMPRESSION:  1.  Mild small vessel disease. 2.  No significant proximal stenosis, aneurysm, or branch vessel occlusion.  Original Report Authenticated By: Jamesetta Orleans. MATTERN, M.D.   Mr Cervical Spine W Wo Contrast  07/31/2011  *RADIOLOGY REPORT*  Clinical Data: Neck and low back pain.  MRI CERVICAL SPINE WITHOUT AND WITH CONTRAST  Technique:  Multiplanar and multiecho pulse sequences of the cervical spine, to include the  craniocervical junction and cervicothoracic junction, were obtained according to standard protocol without and with intravenous contrast.  Contrast: 16mL MULTIHANCE GADOBENATE DIMEGLUMINE 529 MG/ML IV SOLN  Comparison: 03/06/2007  Findings: The scan extends from above the top of the clivus through T1-2.  The visualized intracranial contents are normal.  Cervical spinal cord is normal with no mass lesion or myelopathy.  C1-2 and C2-3:  Normal.  C3-4:  Normal.  C4-5:  Severe left facet arthritis with a 2 mm spondylolisthesis of C4 on C5.  No neural impingement.  Tiny central disc bulge.  C5-6:  Slight disc space narrowing with slight broad-based osteophyte formation with mild bilateral foraminal stenosis.  No soft disc protrusion.  C6-7:  Small central disc protrusion without impingement.  C7-T1:  Tiny central disc bulge with no impingement.  T1-2:  Normal.  Note is made of an inhomogeneous enhancing nodule in the left lobe of the thyroid gland measuring 3 x 4 cm in size.  If this is not been previously evaluated, ultrasound may be useful for further evaluation.  No pathologic enhancement in the cervical spine after contrast administration.  The irregular nodule in the left lobe of the thyroid gland does enhance inhomogeneously.  IMPRESSION:  1. Moderately severe left facet arthritis at C4-5. 2.  Multilevel mild degenerative disc disease without neural impingement. 3.  3 x 4 cm inhomogeneous enhancing mass in the left lobe of the thyroid gland.  Further evaluation by ultrasound may be useful on an elective basis.  Original Report Authenticated By: Gwynn Burly, M.D.   Mr Lumbar Spine W Wo Contrast  07/31/2011  *RADIOLOGY REPORT*  Clinical Data: Right leg pain and loss of sensation in the right leg.  Radiculopathy.  MRI LUMBAR SPINE WITHOUT AND WITH CONTRAST  Technique:  Multiplanar and multiecho pulse sequences of the lumbar spine were obtained without and with intravenous contrast.  Contrast: 16mL MULTIHANCE  GADOBENATE DIMEGLUMINE 529 MG/ML IV SOLN  Comparison: MRI dated 02/13/2008  Findings: Scan extends from T12-L1 through S5.  Tip of the conus is at L2-3 and is normal other than being slightly low-lying.  T12-L1 through L1-2:  Normal.  L2-3:  Tiny diffuse disc bulge with no impingement.  L3-4:  Minimal bulge of the disc into the right lateral recess without significant neural impingement, unchanged.  L4-5:  Disc protrusion into the left neural foramen with moderate left facet arthritis and bilateral ligamentum flava hypertrophy causing mild to moderate spinal stenosis and left foraminal stenosis.  This has progressed only slightly since the prior study. There is a mass effect upon the left L4 nerve after it has exited the neural foramen but the patient does not report left leg symptoms.  Mild right facet arthritis.  L5 S1:  Small disc protrusion into the right lateral recess with slight bulging to the left lateral recess.  Moderate bilateral  facet arthritis.  Mild bilateral foraminal stenosis without focal neural impingement. Moderate spinal stenosis.  After contrast administration there is enhancement around the left facet joint at L4-5 and slight enhancement around the facet joints bilaterally at L5-S1.  IMPRESSION:  1. Mild to moderate spinal stenosis at L4-5 primarily due to ligamenta flava and facet joint hypertrophy.  Small protrusion into the left neural foramen. 2. Moderate spinal stenosis at L5-S1 due to ligamentum flava and facet joint hypertrophy.  Original Report Authenticated By: Gwynn Burly, M.D.    Brief H and P: For complete details, refer to admission H and P. However,  in brief, this is a 72 year old female, with known history of left 40-69% ICA stenosis per carotid Doppler done in April of 2012, who for the past 1 month or so has been having intermittent right neck, right shoulder and right arm shooting pains, as well as pains going down in her right leg with loss of sensation below the  knee. Two months ago, she noticed left lower extremity weakness and numbness, which was self-limiting and resolved within a day. She now presents with right leg pain, travelling up her right leg, associated with right upper extremity weakness and pain, then a right-sided temporal headache and slurred speech that lasted only 15 minutes. There was no visual obscuration or diplopia, but she was ataxic, with a tendency to fall, to her right. She was admitted for further evaluation, investigation and management.   Physical Exam: On 08/02/11. General: Comfortable, alert, communicative, fully oriented, not short of breath at rest.  HEENT: No clinical pallor, no jaundice, no conjunctival injection or discharge. Hydration is fair.  NECK: Supple, JVP not seen, no carotid bruits, no palpable lymphadenopathy, no palpable goiter.  CHEST: Clinically clear to auscultation, no wheezes, no crackles.  HEART: Sounds 1 and 2 heard, normal, regular, no murmurs.  ABDOMEN: Full, soft, non-tender, no palpable organomegaly, no palpable masses, normal bowel sounds.  GENITALIA: Not examined.  LOWER EXTREMITIES: No pitting edema, palpable peripheral pulses.  MUSCULOSKELETAL SYSTEM: Generalized osteoarthritic changes..  CENTRAL NERVOUS SYSTEM: No focal neurologic deficit on gross examination.   Hospital Course:  1. Question of transient ischemic attack: Patient presented as described above. Symptoms were sufficiently confusing, to suggest possible functional overlay, but the possiblity of a TIA, or possible demyelinating disease, had to be entertained. Appropriate work up was carried out. MRI/MRA of the brain showed no acute intracranial findings or proximal stenoses. Carotid Doppler showed left 40-59% internal carotid artery stenosis which is unchanged from previously. 2-D echocardiogram showed wall thickness increased in a pattern of mild LVH and estimated ejection fraction was in the range of 55% to 60%, with no regional wall  motion abnormalities. Patient has been reassured accordingly. 2. Bilateral lower extremity pain: MRI of the C-spine and L-spine showed DJD, with out significant neurologic compromise. Patient has symptomatically improved. PT evaluated the patient and saw no acute needs.  3. Left lobe thyroid gland 3 x 4 cm mass: This is an incidental finding on imaging studies. TSH and free T4 are within normal limits. We did attempt to get Dr Bonnielee Haff, IR to do U/S-Guided biopsy on 08/02/11, but this procedure was not feasible, as patient is on Plavix.  4. Hyperlipidemia: LDL 116. Patient is on Rosuvastatin 20 mg a day.  5. History of GERD: Asymptomatic on PPI.   Comment: patient was asymptomatic on 08/02/11, and considered clinically stable for discharge. I did discuss with her primary MD, Dr,. Oliver Barre, tel# [phone], and  he has assured me that his office will contact patient to schedule an appointment for thyroid ultrasound. I have communicated as much to patient.  Time spent on Discharge: 45 mins.  Signed: Sharlie Shreffler,CHRISTOPHER 08/02/2011, 12:42 PM

## 2011-08-02 NOTE — Progress Notes (Signed)
Physical Therapy Treatment Patient Details Name: [name] MRN: [id number] DOB: 19-Apr-1941 Today's Date: 08/02/2011 Time: 3664-4034  1G PT Assessment/Plan  PT - Assessment/Plan Comments on Treatment Session: Pt has progressed well with ambulation and balance. Continue to recommend pt wear good supportive shoes, that fit completely on foot, when performing standing exercises/balance activities. Possible D/C home today with daughter assisting as needed.  PT Plan: Discharge plan remains appropriate Follow Up Recommendations: None Equipment Recommended: None recommended by PT PT Goals  Acute Rehab PT Goals PT Goal: Ambulate - Progress: Met Additional Goals PT Goal: Additional Goal #1 - Progress: Met  PT Treatment Precautions/Restrictions  Precautions Precautions: Fall Mobility (including Balance) Bed Mobility Bed Mobility: No Transfers Transfers: Yes Sit to Stand: 6: Modified independent (Device/Increase time) Stand to Sit: 6: Modified independent (Device/Increase time) Ambulation/Gait Ambulation/Gait: Yes Ambulation/Gait Assistance: 5: Supervision Ambulation/Gait Assistance Details (indicate cue type and reason): No LOB this session. Pt able to perform multi-directional head turns without LOB (some wavering noted).  Ambulation Distance (Feet): 400 Feet Assistive device: None Gait Pattern: Within Functional Limits    Exercise  General Exercises - Lower Extremity-holding onto countertop PRN Hip Flexion/Marching: AROM;10 reps;Standing Toe Raises: AROM;10 reps;Standing Heel Raises: AROM;10 reps;Standing Mini-Sqauts: AROM;10 reps;Standing Other Exercises Other Exercises: Standing knee flexion x 10 reps (R and L)  End of Session PT - End of Session Equipment Utilized During Treatment: Gait belt Activity Tolerance: Patient tolerated treatment well Patient left: in chair;with call bell in reach General Behavior During Session: [facility] for tasks performed Cognition: [facility] for  tasks performed  Rebeca Alert [facility] 08/02/2011, 12:08 PM [phone]

## 2011-08-06 ENCOUNTER — Other Ambulatory Visit: Payer: Medicare Other

## 2011-08-09 ENCOUNTER — Ambulatory Visit (INDEPENDENT_AMBULATORY_CARE_PROVIDER_SITE_OTHER): Payer: Medicare Other | Admitting: Internal Medicine

## 2011-08-09 ENCOUNTER — Encounter: Payer: Self-pay | Admitting: Internal Medicine

## 2011-08-09 VITALS — BP 120/80 | HR 67 | Temp 98.1°F | Ht 66.5 in | Wt 178.0 lb

## 2011-08-09 DIAGNOSIS — E041 Nontoxic single thyroid nodule: Secondary | ICD-10-CM

## 2011-08-09 DIAGNOSIS — E042 Nontoxic multinodular goiter: Secondary | ICD-10-CM | POA: Insufficient documentation

## 2011-08-09 DIAGNOSIS — R7309 Other abnormal glucose: Secondary | ICD-10-CM

## 2011-08-09 DIAGNOSIS — F329 Major depressive disorder, single episode, unspecified: Secondary | ICD-10-CM

## 2011-08-09 DIAGNOSIS — R7302 Impaired glucose tolerance (oral): Secondary | ICD-10-CM

## 2011-08-09 HISTORY — DX: Nontoxic single thyroid nodule: E04.1

## 2011-08-09 NOTE — Patient Instructions (Addendum)
Continue all other medications as before You will be contacted regarding the referral for: thyroid ultrasound

## 2011-08-12 ENCOUNTER — Encounter: Payer: Self-pay | Admitting: Internal Medicine

## 2011-08-12 NOTE — Assessment & Plan Note (Signed)
asympt -  Lab Results  Component Value Date   TSH 0.400 07/31/2011   For thyroid u/s for further eval; to endo if verified thryoid nodule, on plavix and recent bx deferred

## 2011-08-12 NOTE — Progress Notes (Signed)
Subjective:    Patient ID: [name], female    DOB: [birth year], 71 y.o.   MRN: [id number]  HPI  Here to f/u; overall doing ok,  Pt denies chest pain, increased sob or doe, wheezing, orthopnea, PND, increased LE swelling, palpitations, dizziness or syncope.  Pt denies new neurological symptoms such as new headache, or facial or extremity weakness or numbness   Pt denies polydipsia, polyuria,   Pt states overall good compliance with meds, trying to follow lower cholesterol, diabetic diet, wt overall stable but little exercise however. Has thyroid nodule noted on CT - now for u/s.  Denies worsening depressive symptoms, suicidal ideation, or panic, though has ongoing anxiety Past Medical History  Diagnosis Date  . Impaired glucose tolerance 01/14/2011  . Acute sinus infection 01/15/2011  . ANXIETY 10/03/2009  . BARRETTS ESOPHAGUS 10/03/2009  . CAROTID ARTERY DISEASE 08/31/2009  . COLONIC POLYPS, HX OF 08/29/2009  . CONSTIPATION, CHRONIC 10/03/2009  . DEPRESSION 10/03/2009  . DISC DISEASE, CERVICAL 08/29/2009  . DISC DISEASE, LUMBAR 08/29/2009  . Eustachian tube dysfunction 01/15/2011  . GERD 08/29/2009  . HYPERLIPIDEMIA 08/29/2009  . LEG PAIN, BILATERAL 08/29/2009  . LOW BACK PAIN, CHRONIC 11/22/2009  . PERIPHERAL NEUROPATHY 10/03/2009  . PERIPHERAL VASCULAR DISEASE 08/29/2009  . POSITIVE PPD 10/03/2009  . Preventative health care 01/14/2011  . TIA 11/22/2009  . TRANSIENT ISCHEMIC ATTACK, HX OF 08/29/2009  . URETHRAL STRICTURE 10/03/2009  . VOCAL CORD POLYP, HX OF 10/03/2009  . WRIST PAIN, BILATERAL 05/25/2010  . Thyroid nodule 08/09/2011   Past Surgical History  Procedure Date  . S/p vocal cord polyps 1984    chronic hoarseness  . Tubal ligation   . Stress test negative 2003,2004,2006  . Hx of right ankle fracture     reports that she has never smoked. She has never used smokeless tobacco. She reports that she does not drink alcohol or use illicit drugs. family history includes Cancer in her other;  Diabetes in her mother; Heart disease in her mother; Hypertension in her mother; Multiple myeloma in her sister; and Stroke in her mother. Allergies  Allergen Reactions  . Atorvastatin     REACTION: myalgia  . Penicillins Hives   Current Outpatient Prescriptions on File Prior to Visit  Medication Sig Dispense Refill  . aspirin 325 MG tablet Take 325 mg by mouth daily.        . CRESTOR 20 MG tablet TAKE ONE TABLET BY MOUTH EVERY DAY  90 each  3  . gabapentin (NEURONTIN) 600 MG tablet Take 1 tablet (600 mg total) by mouth 3 (three) times daily as needed.  90 tablet  5  . Multiple Vitamin (MULTIVITAMIN) capsule Take 1 capsule by mouth daily.        Marland Kitchen omeprazole (PRILOSEC) 20 MG capsule TAKE TWO CAPSULES BY MOUTH EVERY DAY  180 capsule  3  . PLAVIX 75 MG tablet TAKE ONE TABLET BY MOUTH EVERY DAY  90 each  1  . triamcinolone (KENALOG) 0.1 % cream Apply topically 2 (two) times daily.  30 g  0   Review of Systems Review of Systems  Constitutional: Negative for diaphoresis and unexpected weight change.  HENT: Negative for drooling and tinnitus.   Eyes: Negative for photophobia and visual disturbance.  Respiratory: Negative for choking and stridor.   Gastrointestinal: Negative for vomiting and blood in stool.  Genitourinary: Negative for hematuria and decreased urine volume.  Musculoskeletal: Negative for gait problem.    Objective:   Physical  Exam BP 120/80  Pulse 67  Temp(Src) 98.1 F (36.7 C) (Oral)  Ht 5' 6.5" (1.689 m)  Wt 178 lb (80.74 kg)  BMI 28.30 kg/m2  SpO2 98% Physical Exam  VS noted Constitutional: Pt appears well-developed and well-nourished.  HENT: Head: Normocephalic.  Right Ear: External ear normal.  Left Ear: External ear normal.  thryoid mild enlarged, nontender Eyes: Conjunctivae and EOM are normal. Pupils are equal, round, and reactive to light.  Neck: Normal range of motion. Neck supple.  Cardiovascular: Normal rate and regular rhythm.   Pulmonary/Chest:  Effort normal and breath sounds normal.  Neurological: Pt is alert. No cranial nerve deficit.  Skin: Skin is warm. No erythema.  Psychiatric: Pt behavior is normal. Thought content normal. 1-2+ nervous, not dysphoric    Assessment & Plan:

## 2011-08-12 NOTE — Assessment & Plan Note (Signed)
stable overall by hx and exam, most recent data reviewed with pt, and pt to continue medical treatment as before  Lab Results  Component Value Date   HGBA1C 6.1* 07/31/2011

## 2011-08-12 NOTE — Assessment & Plan Note (Signed)
stable overall by hx and exam, most recent data reviewed with pt, and pt to continue medical treatment as before  Lab Results  Component Value Date   WBC 4.6 07/30/2011   HGB 15.3* 07/30/2011   HCT 45.0 07/30/2011   PLT 149* 07/30/2011   GLUCOSE 90 07/30/2011   CHOL 181 07/31/2011   TRIG 86 07/31/2011   HDL 48 07/31/2011   LDLDIRECT 194.5 05/25/2010   LDLCALC 116* 07/31/2011   ALT 22 07/30/2011   AST 24 07/30/2011   NA 144 07/30/2011   K 3.7 07/30/2011   CL 107 07/30/2011   CREATININE 0.90 07/30/2011   BUN 7 07/30/2011   CO2 27 07/30/2011   TSH 0.400 07/31/2011   INR 1.01 07/30/2011   HGBA1C 6.1* 07/31/2011

## 2011-08-14 ENCOUNTER — Other Ambulatory Visit: Payer: Self-pay | Admitting: Internal Medicine

## 2011-08-14 ENCOUNTER — Ambulatory Visit (HOSPITAL_COMMUNITY)
Admission: RE | Admit: 2011-08-14 | Discharge: 2011-08-14 | Disposition: A | Discharge status: Home / Self Care | Payer: Medicare Other | Source: Ambulatory Visit | Attending: Internal Medicine | Admitting: Internal Medicine

## 2011-08-14 DIAGNOSIS — E041 Nontoxic single thyroid nodule: Secondary | ICD-10-CM

## 2011-08-14 DIAGNOSIS — E042 Nontoxic multinodular goiter: Secondary | ICD-10-CM | POA: Insufficient documentation

## 2011-08-15 ENCOUNTER — Other Ambulatory Visit (HOSPITAL_COMMUNITY): Payer: Medicare Other

## 2011-08-16 ENCOUNTER — Encounter: Payer: Self-pay | Admitting: Endocrinology

## 2011-08-16 ENCOUNTER — Ambulatory Visit (INDEPENDENT_AMBULATORY_CARE_PROVIDER_SITE_OTHER): Payer: Medicare Other | Admitting: Endocrinology

## 2011-08-16 VITALS — BP 108/72 | HR 66 | Temp 97.8°F | Ht 66.5 in | Wt 176.2 lb

## 2011-08-16 DIAGNOSIS — E042 Nontoxic multinodular goiter: Secondary | ICD-10-CM

## 2011-08-16 NOTE — Patient Instructions (Addendum)
most of the time, a "lumpy thyroid" will eventually become overactive.  this is usually a slow process, happening over the span of many years. Please return in 1 year.   

## 2011-08-16 NOTE — Progress Notes (Signed)
Subjective:    Patient ID: [name], female    DOB: [birth year], 71 y.o.   MRN: [id number]  HPI Pt was incidentally noted on neck mri last month, to have a goiter.  She is unaware of ever having had a thyroid problem.  She has a few days of moderate pain at the right posterior neck, but no assoc numbness.   Past Medical History  Diagnosis Date  . Impaired glucose tolerance 01/14/2011  . Acute sinus infection 01/15/2011  . ANXIETY 10/03/2009  . BARRETTS ESOPHAGUS 10/03/2009  . CAROTID ARTERY DISEASE 08/31/2009  . COLONIC POLYPS, HX OF 08/29/2009  . CONSTIPATION, CHRONIC 10/03/2009  . DEPRESSION 10/03/2009  . DISC DISEASE, CERVICAL 08/29/2009  . DISC DISEASE, LUMBAR 08/29/2009  . Eustachian tube dysfunction 01/15/2011  . GERD 08/29/2009  . HYPERLIPIDEMIA 08/29/2009  . LEG PAIN, BILATERAL 08/29/2009  . LOW BACK PAIN, CHRONIC 11/22/2009  . PERIPHERAL NEUROPATHY 10/03/2009  . PERIPHERAL VASCULAR DISEASE 08/29/2009  . POSITIVE PPD 10/03/2009  . Preventative health care 01/14/2011  . TIA 11/22/2009  . TRANSIENT ISCHEMIC ATTACK, HX OF 08/29/2009  . URETHRAL STRICTURE 10/03/2009  . VOCAL CORD POLYP, HX OF 10/03/2009  . WRIST PAIN, BILATERAL 05/25/2010  . Thyroid nodule 08/09/2011    Past Surgical History  Procedure Date  . S/p vocal cord polyps 1984    chronic hoarseness  . Tubal ligation   . Stress test negative 2003,2004,2006  . Hx of right ankle fracture     History   Social History  . Marital Status: Married    Spouse Name: N/A    Number of Children: 3  . Years of Education: N/A   Occupational History  . retired [employer] representative    Social History Main Topics  . Smoking status: Never Smoker   . Smokeless tobacco: Never Used  . Alcohol Use: No  . Drug Use: No  . Sexually Active: Not on file   Other Topics Concern  . Not on file   Social History Narrative   Husband with dementia    Current Outpatient Prescriptions on File Prior to Visit  Medication Sig Dispense Refill  .  ALPRAZolam (XANAX) 0.25 MG tablet Take 0.25 mg by mouth at bedtime as needed.      Marland Kitchen aspirin 325 MG tablet Take 325 mg by mouth daily.        . CRESTOR 20 MG tablet TAKE ONE TABLET BY MOUTH EVERY DAY  90 each  3  . gabapentin (NEURONTIN) 600 MG tablet Take 1 tablet (600 mg total) by mouth 3 (three) times daily as needed.  90 tablet  5  . Multiple Vitamin (MULTIVITAMIN) capsule Take 1 capsule by mouth daily.        Marland Kitchen omeprazole (PRILOSEC) 20 MG capsule TAKE TWO CAPSULES BY MOUTH EVERY DAY  180 capsule  3  . PLAVIX 75 MG tablet TAKE ONE TABLET BY MOUTH EVERY DAY  90 each  1  . triamcinolone (KENALOG) 0.1 % cream Apply topically 2 (two) times daily.  30 g  0    Allergies  Allergen Reactions  . Atorvastatin     REACTION: myalgia  . Penicillins Hives    Family History  Problem Relation Age of Onset  . Stroke Mother   . Heart disease Mother   . Hypertension Mother   . Diabetes Mother   . Multiple myeloma Sister   . Cancer Other     lung  no goiter or other thyroid problem  BP  108/72  Pulse 66  Temp(Src) 97.8 F (36.6 C) (Oral)  Ht 5' 6.5" (1.689 m)  Wt 176 lb 3.2 oz (79.924 kg)  BMI 28.01 kg/m2  SpO2 98%    Review of Systems  Constitutional: Negative for unexpected weight change.  HENT: Negative for hearing loss.   Eyes: Negative for visual disturbance.  Respiratory: Negative for shortness of breath.   Cardiovascular: Negative for chest pain.  Gastrointestinal: Negative for anal bleeding.  Genitourinary: Negative for hematuria.  Musculoskeletal: Negative for gait problem.  Skin: Negative for rash.  Neurological: Negative for syncope.  Hematological: Bruises/bleeds easily.  Psychiatric/Behavioral: Negative for dysphoric mood.      Objective:   Physical Exam VS: see vs page GEN: no distress HEAD: head: no deformity eyes: no periorbital swelling, no proptosis external nose and ears are normal mouth: no lesion seen NECK: there is a ? Of left thyroid nodule, but i  can't be certain.   CHEST WALL: no deformity LUNGS:  Clear to auscultation CV: reg rate and rhythm, no murmur MUSCULOSKELETAL: muscle bulk and strength are grossly normal.  no obvious joint swelling.  gait is normal and steady EXTEMITIES: no deformity of the hands.  no edema of the legs. NEURO:  cn 2-12 grossly intact.   readily moves all 4's.  sensation is intact to touch on all 4's SKIN:  Normal texture and temperature.  No rash or suspicious lesion is visible.  Not diaphoretic.  NODES:  None palpable at the neck PSYCH: alert, oriented x3.  Does not appear anxious nor depressed.  Lab Results  Component Value Date   TSH 0.400 07/31/2011  (i reviewed ultrasound report)    Assessment & Plan:  Multinodular goiter, which is usually hereditary. H/O [place].  This causes high risk of thyroid surgery.  Therefore, i favor f/u rather than bx now Neck pain.  Not thyroid-related Borderline low tsh.   She will develop hyperthyroidism with time, and then she will be a candidate for i-131 rx

## 2011-08-24 ENCOUNTER — Ambulatory Visit: Payer: Medicare Other | Admitting: Endocrinology

## 2011-10-05 ENCOUNTER — Emergency Department (INDEPENDENT_AMBULATORY_CARE_PROVIDER_SITE_OTHER): Payer: Medicare Other

## 2011-10-05 ENCOUNTER — Encounter (HOSPITAL_COMMUNITY): Payer: Self-pay

## 2011-10-05 ENCOUNTER — Emergency Department (HOSPITAL_COMMUNITY)
Admission: EM | Admit: 2011-10-05 | Discharge: 2011-10-05 | Disposition: A | Discharge status: Home / Self Care | Payer: Medicare Other | Source: Home / Self Care

## 2011-10-05 DIAGNOSIS — J209 Acute bronchitis, unspecified: Secondary | ICD-10-CM

## 2011-10-05 DIAGNOSIS — J069 Acute upper respiratory infection, unspecified: Secondary | ICD-10-CM

## 2011-10-05 MED ORDER — BENZONATATE 100 MG PO CAPS: ORAL_CAPSULE | ORAL | Status: AC

## 2011-10-05 MED ORDER — DOXYCYCLINE HYCLATE 100 MG PO CAPS: 100.0000 mg | ORAL_CAPSULE | Freq: Two times a day (BID) | ORAL | Status: AC

## 2011-10-05 MED ORDER — BENZONATATE 100 MG PO CAPS: ORAL_CAPSULE | ORAL | Status: DC

## 2011-10-05 NOTE — Discharge Instructions (Signed)
Increase fluids and rest. Tylenol or Ibuprofen as needed for fever or discomfort. Return if symptoms change or worsen, or go to ER if severe.

## 2011-10-05 NOTE — ED Notes (Addendum)
Reports cough w congestion, chills since yesterday; c/o she blew about a pint of clear phlegm since yesterday, and has been having right sided chest tightness

## 2011-10-05 NOTE — ED Provider Notes (Signed)
History     CSN: [id number]  Arrival date & time 10/05/11  1850   None     Chief Complaint  Patient presents with  . Cough    (Consider location/radiation/quality/duration/timing/severity/associated sxs/prior treatment) HPI Comments: Patient presents this evening with complaints of cough congestion and fever. She states her symptoms began one week ago with chest congestion and rattling in her right chest. Her cough is nonproductive. Nasal congestion has begun in the last 2 days. Her nasal mucous is clear. Fever began today with maximum temperature of 103. Right inferior anterior chest hurts with cough and deep breath. She denies wheezing but has had intermittent dyspnea.   Past Medical History  Diagnosis Date  . Impaired glucose tolerance 01/14/2011  . Acute sinus infection 01/15/2011  . ANXIETY 10/03/2009  . BARRETTS ESOPHAGUS 10/03/2009  . CAROTID ARTERY DISEASE 08/31/2009  . COLONIC POLYPS, HX OF 08/29/2009  . CONSTIPATION, CHRONIC 10/03/2009  . DEPRESSION 10/03/2009  . DISC DISEASE, CERVICAL 08/29/2009  . DISC DISEASE, LUMBAR 08/29/2009  . Eustachian tube dysfunction 01/15/2011  . GERD 08/29/2009  . HYPERLIPIDEMIA 08/29/2009  . LEG PAIN, BILATERAL 08/29/2009  . LOW BACK PAIN, CHRONIC 11/22/2009  . PERIPHERAL NEUROPATHY 10/03/2009  . PERIPHERAL VASCULAR DISEASE 08/29/2009  . POSITIVE PPD 10/03/2009  . Preventative health care 01/14/2011  . TIA 11/22/2009  . TRANSIENT ISCHEMIC ATTACK, HX OF 08/29/2009  . URETHRAL STRICTURE 10/03/2009  . VOCAL CORD POLYP, HX OF 10/03/2009  . WRIST PAIN, BILATERAL 05/25/2010  . Thyroid nodule 08/09/2011    Past Surgical History  Procedure Date  . S/p vocal cord polyps 1984    chronic hoarseness  . Tubal ligation   . Stress test negative 2003,2004,2006  . Hx of right ankle fracture     Family History  Problem Relation Age of Onset  . Stroke Mother   . Heart disease Mother   . Hypertension Mother   . Diabetes Mother   . Multiple myeloma Sister   . Cancer  Other     lung    History  Substance Use Topics  . Smoking status: Never Smoker   . Smokeless tobacco: Never Used  . Alcohol Use: No    OB History    Grav Para Term Preterm Abortions TAB SAB Ect Mult Living                  Review of Systems  Constitutional: Positive for fever and chills.  HENT: Positive for congestion and rhinorrhea. Negative for ear pain, sore throat and sinus pressure.   Respiratory: Positive for cough and shortness of breath. Negative for wheezing.     Allergies  Atorvastatin and Penicillins  Home Medications   Current Outpatient Rx  Name Route Sig Dispense Refill  . ALPRAZOLAM 0.25 MG PO TABS Oral Take 0.25 mg by mouth at bedtime as needed.    . ASPIRIN 325 MG PO TABS Oral Take 325 mg by mouth daily.      . CRESTOR 20 MG PO TABS  TAKE ONE TABLET BY MOUTH EVERY DAY 90 each 3  . MULTIVITAMINS PO CAPS Oral Take 1 capsule by mouth daily.      Marland Kitchen OMEPRAZOLE 20 MG PO CPDR  TAKE TWO CAPSULES BY MOUTH EVERY DAY 180 capsule 3  . PLAVIX 75 MG PO TABS  TAKE ONE TABLET BY MOUTH EVERY DAY 90 each 1  . BENZONATATE 100 MG PO CAPS  1-2 caps every 8 hrs prn cough 30 capsule 0  . DOXYCYCLINE HYCLATE  100 MG PO CAPS Oral Take 1 capsule (100 mg total) by mouth 2 (two) times daily. 20 capsule 0  . TRIAMCINOLONE ACETONIDE 0.1 % EX CREA Topical Apply topically 2 (two) times daily. 30 g 0    BP 143/73  Pulse 90  Temp(Src) 100.6 F (38.1 C) (Oral)  Resp 18  SpO2 100%  Physical Exam  Nursing note and vitals reviewed. Constitutional: She appears well-developed and well-nourished. No distress.  HENT:  Head: Normocephalic and atraumatic.  Right Ear: Tympanic membrane, external ear and ear canal normal.  Left Ear: Tympanic membrane, external ear and ear canal normal.  Nose: Nose normal.  Mouth/Throat: Uvula is midline, oropharynx is clear and moist and mucous membranes are normal. No oropharyngeal exudate, posterior oropharyngeal edema or posterior oropharyngeal  erythema.  Neck: Neck supple.  Cardiovascular: Normal rate, regular rhythm and normal heart sounds.   Pulmonary/Chest: Effort normal and breath sounds normal. No respiratory distress. She exhibits no tenderness.  Lymphadenopathy:    She has no cervical adenopathy.  Neurological: She is alert.  Skin: Skin is warm and dry.  Psychiatric: She has a normal mood and affect.    ED Course  Procedures (including critical care time)  Labs Reviewed - No data to display Dg Chest 2 View  10/05/2011  *RADIOLOGY REPORT*  Clinical Data: Cough.  Low grade fever.  Shortness of breath.  CHEST - 2 VIEW  Comparison: 07/30/2011.  Findings: Improved inspiration with a normal sized heart.  Clear lungs.  Thoracic spine degenerative changes.  IMPRESSION: No acute abnormality.  Original Report Authenticated By: Darrol Angel, M.D.     1. Acute bronchitis   2. URI (upper respiratory infection)       MDM          Melody Comas, PA 10/06/11 2105

## 2011-10-07 NOTE — ED Provider Notes (Signed)
Medical screening examination/treatment/procedure(s) were performed by non-physician practitioner and as supervising physician I was immediately available for consultation/collaboration.  Leslee Home, M.D.   Reuben Likes, MD 10/07/11 [phone]

## 2012-06-02 ENCOUNTER — Encounter (HOSPITAL_COMMUNITY): Payer: Self-pay | Admitting: Emergency Medicine

## 2012-06-02 ENCOUNTER — Emergency Department (HOSPITAL_COMMUNITY): Payer: Medicare Other

## 2012-06-02 ENCOUNTER — Other Ambulatory Visit: Payer: Self-pay | Admitting: Internal Medicine

## 2012-06-02 ENCOUNTER — Emergency Department (HOSPITAL_COMMUNITY)
Admission: EM | Admit: 2012-06-02 | Discharge: 2012-06-02 | Disposition: A | Discharge status: Home / Self Care | Payer: Medicare Other | Attending: Emergency Medicine | Admitting: Emergency Medicine

## 2012-06-02 DIAGNOSIS — Z8673 Personal history of transient ischemic attack (TIA), and cerebral infarction without residual deficits: Secondary | ICD-10-CM | POA: Insufficient documentation

## 2012-06-02 DIAGNOSIS — Z1231 Encounter for screening mammogram for malignant neoplasm of breast: Secondary | ICD-10-CM

## 2012-06-02 DIAGNOSIS — R209 Unspecified disturbances of skin sensation: Secondary | ICD-10-CM | POA: Insufficient documentation

## 2012-06-02 DIAGNOSIS — K2270 Barrett's esophagus without dysplasia: Secondary | ICD-10-CM | POA: Insufficient documentation

## 2012-06-02 DIAGNOSIS — M5137 Other intervertebral disc degeneration, lumbosacral region: Secondary | ICD-10-CM | POA: Insufficient documentation

## 2012-06-02 DIAGNOSIS — E785 Hyperlipidemia, unspecified: Secondary | ICD-10-CM | POA: Insufficient documentation

## 2012-06-02 DIAGNOSIS — M5030 Other cervical disc degeneration, unspecified cervical region: Secondary | ICD-10-CM | POA: Insufficient documentation

## 2012-06-02 DIAGNOSIS — M542 Cervicalgia: Secondary | ICD-10-CM | POA: Insufficient documentation

## 2012-06-02 DIAGNOSIS — Z9851 Tubal ligation status: Secondary | ICD-10-CM | POA: Insufficient documentation

## 2012-06-02 DIAGNOSIS — I739 Peripheral vascular disease, unspecified: Secondary | ICD-10-CM | POA: Insufficient documentation

## 2012-06-02 DIAGNOSIS — Z8659 Personal history of other mental and behavioral disorders: Secondary | ICD-10-CM | POA: Insufficient documentation

## 2012-06-02 DIAGNOSIS — M549 Dorsalgia, unspecified: Secondary | ICD-10-CM | POA: Insufficient documentation

## 2012-06-02 DIAGNOSIS — K219 Gastro-esophageal reflux disease without esophagitis: Secondary | ICD-10-CM | POA: Insufficient documentation

## 2012-06-02 DIAGNOSIS — M51379 Other intervertebral disc degeneration, lumbosacral region without mention of lumbar back pain or lower extremity pain: Secondary | ICD-10-CM | POA: Insufficient documentation

## 2012-06-02 MED ORDER — METHOCARBAMOL 500 MG PO TABS: 500.0000 mg | ORAL_TABLET | Freq: Two times a day (BID) | ORAL | Status: DC

## 2012-06-02 NOTE — ED Provider Notes (Signed)
Medical screening examination/treatment/procedure(s) were conducted as a shared visit with non-physician practitioner(s) and myself.  I personally evaluated the patient during the encounter Jones Skene, M.D.  [name] is a 71 y.o. female presenting with right paraspinous muscle tenderness which is considered moderate to severe, comes and goes and is aching. She's been picking up her grandchild recently or frequently. Pain does not radiate but she's having some numbness and tingling of her right leg. She's had no motor weakness of her right leg. She's had no falls. She explicitly denies any urinary incontinence, she says that when she stands up she feels like she needs to urinate more intensely than when she is sitting down. She has not urinated or defecated on herself. No saddle anesthesia.   VITAL SIGNS:   Filed Vitals:   06/02/12 1552  BP: 146/76  Pulse: 65  Temp: 97.7 F (36.5 C)  Resp: 16   CONSTITUTIONAL: Awake, oriented, appears non-toxic HENT: Atraumatic, normocephalic, oral mucosa pink and moist, airway patent. Nares patent without drainage. External ears normal. EYES: Conjunctiva clear, EOMI, PERRLA NECK: Trachea midline, non-tender, supple CARDIOVASCULAR: Normal heart rate, Normal rhythm, No murmurs, rubs, gallops PULMONARY/CHEST: Clear to auscultation, no rhonchi, wheezes, or rales. Symmetrical breath sounds. Non-tender. ABDOMINAL: Non-distended, soft, non-tender - no rebound or guarding.  BS normal. NEUROLOGIC: LK:GMWNUU fields intact. PERRLA, EOMI.  Facial sensation equal to light touch bilaterally.  Good muscle bulk in the masseter muscle and good lateral movement of the jaw.  Facial expressions equal and good strength with smile/frown and puffed cheeks.  Hearing grossly intact to finger rub test.  Uvula, tongue are midline with no deviation. Symmetrical palate elevation.  Trapezius and SCM muscles are 5/5 strength bilaterally.   DTR: Brachioradialis, biceps, patellar,  Achilles tendon reflexes 2+ bilaterally.  No clonus. Strength: 5/5 strength flexors and extensors in the upper and lower extremities.  Grip strength, finger adduction/abduction 5/5. Sensation: Sensation intact distally to light touch, proprioception using position testing of 2nd digit and great toe Cerebellar: No ataxia with walking or dysmetria with finger to nose, rapid alternating hand movements and heels to shin testing. Gait and Station: Can walk on heels and toes. Patient cannot maintain balance to walk in a straight line.  Negative Romberg, no pronator drift  EXTREMITIES: No clubbing, cyanosis, or edema SKIN: Warm, Dry, No erythema, No rash  MDM: Normal neurologic exam - paraspinous muscle tenderness small spasm as well, patient may be having some radiculopathy from exacerbation of low back pain.  No signs/symptoms of acute cord compression syndrome, no weight loss, fevers or chills, she's non-IV drug abuser, do not think this is an epidural abscess either.   Jones Skene, MD 06/03/12 0002

## 2012-06-02 NOTE — ED Provider Notes (Signed)
History     CSN: [id number]  Arrival date & time 06/02/12  1504   First MD Initiated Contact with Patient 06/02/12 1731      Chief Complaint  Patient presents with  . Flank Pain  . Neck Pain  . Leg Numbness     (Consider location/radiation/quality/duration/timing/severity/associated sxs/prior treatment) HPI Comments: This is a 71 year old female, who presents to the ED with a chief complaint of right sided-leg pain, numbness, and tingling.  Patient states that her back hurts as well.  She has a history of mini-strokes, and carotid artery stenosis.  She denies any history of back trauma.  She states that she has become incontinent for urine when she stands.  She says that she cannot feel her bladder filling while sitting, but she can while standing.  No bowel incontinence, or saddle anesthesia.  Patient reports that the numbness and pain has been radiating down her right side for the past 3 days.  She has not taken anything to alleviate her symptoms.  The history is provided by the patient. No language interpreter was used.    Past Medical History  Diagnosis Date  . Impaired glucose tolerance 01/14/2011  . Acute sinus infection 01/15/2011  . ANXIETY 10/03/2009  . BARRETTS ESOPHAGUS 10/03/2009  . CAROTID ARTERY DISEASE 08/31/2009  . COLONIC POLYPS, HX OF 08/29/2009  . CONSTIPATION, CHRONIC 10/03/2009  . DEPRESSION 10/03/2009  . DISC DISEASE, CERVICAL 08/29/2009  . DISC DISEASE, LUMBAR 08/29/2009  . Eustachian tube dysfunction 01/15/2011  . GERD 08/29/2009  . HYPERLIPIDEMIA 08/29/2009  . LEG PAIN, BILATERAL 08/29/2009  . LOW BACK PAIN, CHRONIC 11/22/2009  . PERIPHERAL NEUROPATHY 10/03/2009  . PERIPHERAL VASCULAR DISEASE 08/29/2009  . POSITIVE PPD 10/03/2009  . Preventative health care 01/14/2011  . TIA 11/22/2009  . TRANSIENT ISCHEMIC ATTACK, HX OF 08/29/2009  . URETHRAL STRICTURE 10/03/2009  . VOCAL CORD POLYP, HX OF 10/03/2009  . WRIST PAIN, BILATERAL 05/25/2010  . Thyroid nodule 08/09/2011    Past  Surgical History  Procedure Date  . S/p vocal cord polyps 1984    chronic hoarseness  . Tubal ligation   . Stress test negative 2003,2004,2006  . Hx of right ankle fracture     Family History  Problem Relation Age of Onset  . Stroke Mother   . Heart disease Mother   . Hypertension Mother   . Diabetes Mother   . Multiple myeloma Sister   . Cancer Other     lung    History  Substance Use Topics  . Smoking status: Never Smoker   . Smokeless tobacco: Never Used  . Alcohol Use: No    OB History    Grav Para Term Preterm Abortions TAB SAB Ect Mult Living                  Review of Systems  Constitutional: Negative for fever.  Musculoskeletal: Positive for back pain.       Right leg pain, numbness, and tingling.  Psychiatric/Behavioral: The patient is nervous/anxious.   All other systems reviewed and are negative.    Allergies  Atorvastatin and Penicillins  Home Medications   Current Outpatient Rx  Name  Route  Sig  Dispense  Refill  . ASPIRIN 81 MG PO CHEW   Oral   Chew 81 mg by mouth daily.         . CRESTOR 20 MG PO TABS      TAKE ONE TABLET BY MOUTH EVERY DAY  90 each   3   . NAPROXEN SODIUM 220 MG PO CAPS   Oral   Take 2 capsules by mouth daily as needed. For pain         . FISH OIL 1000 MG PO CAPS   Oral   Take 1 capsule by mouth every morning.         Marland Kitchen OMEPRAZOLE 20 MG PO CPDR      TAKE TWO CAPSULES BY MOUTH EVERY DAY   180 capsule   3     BP 146/76  Pulse 65  Temp 97.7 F (36.5 C) (Oral)  Resp 16  Wt 164 lb (74.39 kg)  SpO2 100%  Physical Exam  Nursing note and vitals reviewed. Constitutional: She is oriented to person, place, and time. She appears well-developed and well-nourished. No distress.  HENT:  Head: Normocephalic and atraumatic.  Eyes: Conjunctivae normal and EOM are normal. Pupils are equal, round, and reactive to light. Right eye exhibits no discharge. Left eye exhibits no discharge. No scleral icterus.    Neck: Normal range of motion. Neck supple.  Cardiovascular: Normal rate, regular rhythm and normal heart sounds.  Exam reveals no gallop and no friction rub.   No murmur heard. Pulmonary/Chest: Effort normal and breath sounds normal. No respiratory distress. She has no wheezes. She has no rales. She exhibits no tenderness.  Abdominal: Soft. Bowel sounds are normal. She exhibits no distension and no mass. There is no tenderness. There is no rebound and no guarding.  Musculoskeletal: Normal range of motion.       Sensation and strength intact bilaterally, patient able to ambulate, 5/5 strength throughout, FROM  Neurological: She is alert and oriented to person, place, and time. She has normal reflexes.  Skin: Skin is warm and dry. She is not diaphoretic.  Psychiatric: She has a normal mood and affect. Her behavior is normal. Judgment and thought content normal.    ED Course  Procedures (including critical care time) Results for orders placed during the hospital encounter of 07/30/11  CBC      Component Value Range   WBC 4.6  4.0 - 10.5 K/uL   RBC 4.88  3.87 - 5.11 MIL/uL   Hemoglobin 14.9  12.0 - 15.0 g/dL   HCT 40.9  81.1 - 91.4 %   MCV 93.4  78.0 - 100.0 fL   MCH 30.5  26.0 - 34.0 pg   MCHC 32.7  30.0 - 36.0 g/dL   RDW 78.2  95.6 - 21.3 %   Platelets 149 (*) 150 - 400 K/uL  DIFFERENTIAL      Component Value Range   Neutrophils Relative 46  43 - 77 %   Neutro Abs 2.1  1.7 - 7.7 K/uL   Lymphocytes Relative 44  12 - 46 %   Lymphs Abs 2.0  0.7 - 4.0 K/uL   Monocytes Relative 8  3 - 12 %   Monocytes Absolute 0.4  0.1 - 1.0 K/uL   Eosinophils Relative 2  0 - 5 %   Eosinophils Absolute 0.1  0.0 - 0.7 K/uL   Basophils Relative 1  0 - 1 %   Basophils Absolute 0.0  0.0 - 0.1 K/uL  COMPREHENSIVE METABOLIC PANEL      Component Value Range   Sodium 140  135 - 145 mEq/L   Potassium 3.7  3.5 - 5.1 mEq/L   Chloride 105  96 - 112 mEq/L   CO2 27  19 - 32 mEq/L  Glucose, Bld 89  70 - 99  mg/dL   BUN 9  6 - 23 mg/dL   Creatinine, Ser 1.61  0.50 - 1.10 mg/dL   Calcium 9.3  8.4 - 09.6 mg/dL   Total Protein 7.0  6.0 - 8.3 g/dL   Albumin 4.0  3.5 - 5.2 g/dL   AST 24  0 - 37 U/L   ALT 22  0 - 35 U/L   Alkaline Phosphatase 76  39 - 117 U/L   Total Bilirubin 0.5  0.3 - 1.2 mg/dL   GFR calc non Af Amer 70 (*) >90 mL/min   GFR calc Af Amer 81 (*) >90 mL/min  CK TOTAL AND CKMB      Component Value Range   Total CK 241 (*) 7 - 177 U/L   CK, MB 5.1 (*) 0.3 - 4.0 ng/mL   Relative Index 2.1  0.0 - 2.5  TROPONIN I      Component Value Range   Troponin I <0.30  <0.30 ng/mL  POCT I-STAT, CHEM 8      Component Value Range   Sodium 144  135 - 145 mEq/L   Potassium 3.7  3.5 - 5.1 mEq/L   Chloride 107  96 - 112 mEq/L   BUN 7  6 - 23 mg/dL   Creatinine, Ser 0.45  0.50 - 1.10 mg/dL   Glucose, Bld 90  70 - 99 mg/dL   Calcium, Ion 4.09  8.11 - 1.32 mmol/L   TCO2 26  0 - 100 mmol/L   Hemoglobin 15.3 (*) 12.0 - 15.0 g/dL   HCT 91.4  78.2 - 95.6 %  PROTIME-INR      Component Value Range   Prothrombin Time 13.5  11.6 - 15.2 seconds   INR 1.01  0.00 - 1.49  APTT      Component Value Range   aPTT 32  24 - 37 seconds  GLUCOSE, CAPILLARY      Component Value Range   Glucose-Capillary 80  70 - 99 mg/dL   Comment 1 Documented in Chart     Comment 2 Notify RN    HEMOGLOBIN A1C      Component Value Range   Hemoglobin A1C 6.1 (*) <5.7 %   Mean Plasma Glucose 128 (*) <117 mg/dL  LIPID PANEL      Component Value Range   Cholesterol 181  0 - 200 mg/dL   Triglycerides 86  <213 mg/dL   HDL 48  >08 mg/dL   Total CHOL/HDL Ratio 3.8     VLDL 17  0 - 40 mg/dL   LDL Cholesterol 657 (*) 0 - 99 mg/dL  TSH      Component Value Range   TSH 0.400  0.350 - 4.500 uIU/mL  T4, FREE      Component Value Range   Free T4 1.16  0.80 - 1.80 ng/dL   Dg Lumbar Spine Complete  06/02/2012  *RADIOLOGY REPORT*  Clinical Data: Right flank pain  LUMBAR SPINE - COMPLETE 4+ VIEW  Comparison: CT abdomen  pelvis 04/06/2010  Findings: There is normal alignment of the lumbar vertebral bodies. There is endplate osteophytosis is similar to comparison CT.  No subluxation.  No acute loss of vertebral body height.  Oblique projections demonstrate no pars fracture.  IMPRESSION: No acute findings of the lumbar spine.  No change from comparison CT.   Original Report Authenticated By: Genevive Bi, M.D.       1. Back pain  MDM   This is a 71 year old female, who presents to the ED with a chief complaint of back pain.  This patient has been seen by and discussed with Dr. Rulon Abide.  I am going to send the patient home on Robaxin, and have her follow-up with her PCP.  The patient is neurovascularly intact and is stable and ready for discharge.       Roxy Horseman, PA-C 06/02/12 1958

## 2012-06-02 NOTE — ED Notes (Signed)
Pt states pain to right flank starting Friday, today pain began in right side of neck and down right side of body to right leg.  Pt states numbness from right knee to foot. Pt able to ambulate.

## 2012-06-27 ENCOUNTER — Other Ambulatory Visit: Payer: Self-pay | Admitting: Cardiology

## 2012-06-27 DIAGNOSIS — I6529 Occlusion and stenosis of unspecified carotid artery: Secondary | ICD-10-CM

## 2012-07-01 ENCOUNTER — Encounter (INDEPENDENT_AMBULATORY_CARE_PROVIDER_SITE_OTHER): Payer: Medicare Other

## 2012-07-01 DIAGNOSIS — R0989 Other specified symptoms and signs involving the circulatory and respiratory systems: Secondary | ICD-10-CM

## 2012-07-01 DIAGNOSIS — I6529 Occlusion and stenosis of unspecified carotid artery: Secondary | ICD-10-CM

## 2012-07-04 ENCOUNTER — Ambulatory Visit: Payer: Medicare Other

## 2012-07-26 ENCOUNTER — Other Ambulatory Visit (HOSPITAL_COMMUNITY): Payer: Medicare Other

## 2012-07-26 ENCOUNTER — Ambulatory Visit: Payer: Medicare Other

## 2012-07-26 ENCOUNTER — Ambulatory Visit (HOSPITAL_COMMUNITY)
Admission: RE | Admit: 2012-07-26 | Discharge: 2012-07-26 | Disposition: A | Discharge status: Home / Self Care | Payer: Medicare Other | Source: Ambulatory Visit | Attending: Family Medicine | Admitting: Family Medicine

## 2012-07-26 ENCOUNTER — Ambulatory Visit (INDEPENDENT_AMBULATORY_CARE_PROVIDER_SITE_OTHER): Payer: Medicare Other | Admitting: Family Medicine

## 2012-07-26 VITALS — BP 145/76 | HR 62 | Temp 98.2°F | Resp 16 | Ht 65.5 in | Wt 167.0 lb

## 2012-07-26 DIAGNOSIS — R29810 Facial weakness: Secondary | ICD-10-CM

## 2012-07-26 DIAGNOSIS — R4781 Slurred speech: Secondary | ICD-10-CM

## 2012-07-26 DIAGNOSIS — R059 Cough, unspecified: Secondary | ICD-10-CM

## 2012-07-26 DIAGNOSIS — J019 Acute sinusitis, unspecified: Secondary | ICD-10-CM

## 2012-07-26 DIAGNOSIS — R05 Cough: Secondary | ICD-10-CM

## 2012-07-26 DIAGNOSIS — R4789 Other speech disturbances: Secondary | ICD-10-CM

## 2012-07-26 LAB — POCT CBC
Hemoglobin: 15.4 g/dL (ref 12.2–16.2)
Lymph, poc: 2.4 (ref 0.6–3.4)
MCH, POC: 30.5 pg (ref 27–31.2)
MCHC: 30.6 g/dL — AB (ref 31.8–35.4)
MCV: 99.9 fL — AB (ref 80–97)
MID (cbc): 0.4 (ref 0–0.9)
MPV: 10.2 fL (ref 0–99.8)
POC MID %: 6.9 %M (ref 0–12)
WBC: 5.1 10*3/uL (ref 4.6–10.2)

## 2012-07-26 MED ORDER — BENZONATATE 200 MG PO CAPS: 200.0000 mg | ORAL_CAPSULE | Freq: Three times a day (TID) | ORAL | Status: DC | PRN

## 2012-07-26 MED ORDER — PROMETHAZINE-CODEINE 6.25-10 MG/5ML PO SYRP: 5.0000 mL | ORAL_SOLUTION | ORAL | Status: DC | PRN

## 2012-07-26 MED ORDER — LEVOFLOXACIN 750 MG PO TABS: 750.0000 mg | ORAL_TABLET | Freq: Every day | ORAL | Status: DC

## 2012-07-26 NOTE — Progress Notes (Signed)
Subjective:    Patient ID: [name], female    DOB: 03/03/1941, 71 y.o.   MRN: [id number]  HPI  [name] is a delightful 71 yo female who was in her baseline state of health until 1 wk ago when she went to the ER w/ her 10 mo granddaughter who had a 105 temp.  By the end of that night, pt had developed chills, nasal congestion, and felt horribly ill. Since then has had severe right-sided HAs, body/muscle aches, nausea/vomiting, cough and congestion.  Last night her right-sided HA was shifted into left side of head and radiated into ear with numbness of her left arm and speech is more slurred than usual.  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  Pt thinks that she might have a some slurred speech and poss even facial droop at baseline but thinks that this might be worse than normal.  Arm numbness is resolved, no numbness or weakness anywhere in body now.  Past Medical History  Diagnosis Date  . Impaired glucose tolerance 01/14/2011  . Acute sinus infection 01/15/2011  . ANXIETY 10/03/2009  . BARRETTS ESOPHAGUS 10/03/2009  . CAROTID ARTERY DISEASE 08/31/2009  . COLONIC POLYPS, HX OF 08/29/2009  . CONSTIPATION, CHRONIC 10/03/2009  . DEPRESSION 10/03/2009  . DISC DISEASE, CERVICAL 08/29/2009  . DISC DISEASE, LUMBAR 08/29/2009  . Eustachian tube dysfunction 01/15/2011  . GERD 08/29/2009  . HYPERLIPIDEMIA 08/29/2009  . LEG PAIN, BILATERAL 08/29/2009  . LOW BACK PAIN, CHRONIC 11/22/2009  . PERIPHERAL NEUROPATHY 10/03/2009  . PERIPHERAL VASCULAR DISEASE 08/29/2009  . POSITIVE PPD 10/03/2009  . Preventative health care 01/14/2011  . TIA 11/22/2009  . TRANSIENT ISCHEMIC ATTACK, HX OF 08/29/2009  . URETHRAL STRICTURE 10/03/2009  . VOCAL CORD POLYP, HX OF 10/03/2009  . WRIST PAIN, BILATERAL  05/25/2010  . Thyroid nodule 08/09/2011   Current Outpatient Prescriptions on File Prior to Visit  Medication Sig Dispense Refill  . aspirin 81 MG chewable tablet Chew 81 mg by mouth daily.      . CRESTOR 20 MG tablet TAKE ONE TABLET BY MOUTH EVERY DAY  90 each  3  . Naproxen Sodium (ALEVE) 220 MG CAPS Take 2 capsules by mouth daily as needed. For pain      . Omega-3 Fatty Acids (FISH OIL) 1000 MG CAPS Take 1 capsule by mouth every morning.      Marland Kitchen omeprazole (PRILOSEC) 20 MG capsule TAKE TWO CAPSULES BY MOUTH EVERY DAY  180 capsule  3  . methocarbamol (ROBAXIN) 500 MG tablet Take 1 tablet (500 mg total) by mouth 2 (two) times daily.  20 tablet  0  . [DISCONTINUED] gabapentin (NEURONTIN) 600 MG tablet Take 1 tablet (600 mg total) by mouth 3 (three) times daily as needed.  90 tablet  5   Allergies  Allergen Reactions  . Atorvastatin     REACTION: myalgia  . Penicillins Hives   Review of Systems  Constitutional: Positive for fever, chills, activity change, appetite change and fatigue.  HENT: Positive for ear pain, congestion, rhinorrhea, neck stiffness, postnasal drip and sinus pressure. Negative for hearing loss, drooling, trouble swallowing, voice change and ear discharge.   Respiratory: Positive for cough. Negative for shortness of breath.   Cardiovascular:  Negative for chest pain.  Gastrointestinal: Positive for nausea and vomiting. Negative for abdominal pain, diarrhea and constipation.  Musculoskeletal: Positive for myalgias, back pain and arthralgias. Negative for gait problem.  Neurological: Positive for speech difficulty, light-headedness, numbness and headaches. Negative for tremors, syncope and weakness.  Psychiatric/Behavioral: Negative for sleep disturbance. The patient is not nervous/anxious.       BP 145/76  Pulse 62  Temp 98.2 F (36.8 C) (Oral)  Resp 16  Ht 5' 5.5" (1.664 m)  Wt 167 lb (75.751 kg)  BMI 27.37 kg/m2  SpO2 97% Objective:   Physical Exam    Constitutional: She is oriented to person, place, and time. She appears well-developed and well-nourished. No distress.  HENT:  Head: Normocephalic and atraumatic.  Right Ear: External ear normal.  Left Ear: External ear normal.  Nose: Nose normal.  Mouth/Throat: Oropharynx is clear and moist. No oropharyngeal exudate.  Eyes: Conjunctivae normal and EOM are normal. Pupils are equal, round, and reactive to light. Right eye exhibits no discharge. Left eye exhibits no discharge. No scleral icterus.  Neck: Normal range of motion. Neck supple. No thyromegaly present.  Cardiovascular: Normal rate, regular rhythm and normal heart sounds.   Pulmonary/Chest: Effort normal. No respiratory distress. She has rhonchi in the left upper field and the left lower field.  Lymphadenopathy:       Head (left side): Submandibular adenopathy present.    She has cervical adenopathy.       Left cervical: Superficial cervical adenopathy present.       Right: No supraclavicular adenopathy present.       Left: No supraclavicular adenopathy present.  Neurological: She is alert and oriented to person, place, and time. She has normal strength and normal reflexes. A cranial nerve deficit is present. No sensory deficit. She exhibits normal muscle tone. She displays a negative Romberg sign. Coordination and gait normal.       Subtle left mouth drop and slurred speech  Skin: Skin is warm and dry. No rash noted. She is not diaphoretic. No erythema.  Psychiatric: She has a normal mood and affect. Her behavior is normal.      Results for orders placed in visit on 07/26/12  POCT CBC      Component Value Range   WBC 5.1  4.6 - 10.2 K/uL   Lymph, poc 2.4  0.6 - 3.4   POC LYMPH PERCENT 47.7  10 - 50 %L   MID (cbc) 0.4  0 - 0.9   POC MID % 6.9  0 - 12 %M   POC Granulocyte 2.3  2 - 6.9   Granulocyte percent 45.4  37 - 80 %G   RBC 5.05  4.04 - 5.48 M/uL   Hemoglobin 15.4  12.2 - 16.2 g/dL   HCT, POC 32.3 (*) 55.7 - 47.9 %    MCV 99.9 (*) 80 - 97 fL   MCH, POC 30.5  27 - 31.2 pg   MCHC 30.6 (*) 31.8 - 35.4 g/dL   RDW, POC 32.2     Platelet Count, POC 142  142 - 424 K/uL   MPV 10.2  0 - 99.8 fL  POCT INFLUENZA A/B      Component Value Range   Influenza A, POC Negative     Influenza B, POC Negative        [facility] reading (PRIMARY) by  Dr. Clelia Croft. Normal CXR  Assessment & Plan:   1. Facial droop  CT Head Wo Contrast  2.  Slurred speech    3. Cough  POCT CBC, POCT Influenza A/B, DG Chest 2 View, levofloxacin (LEVAQUIN) 750 MG tablet, benzonatate (TESSALON) 200 MG capsule, promethazine-codeine (PHENERGAN WITH CODEINE) 6.25-10 MG/5ML syrup  4. Acute sinusitis     Pt sent for stat head CT through  er due to concern for stroke due to left facial droop and slurred speech.  CT results called in several hrs later: no acute stroke, + for acute paranasal sinus congestion. Pt informed over phone to start levaquin qd, RTC if worsening or any recurrence of neuro sxs.

## 2012-07-26 NOTE — Patient Instructions (Addendum)
Go to [facility] now for your CT Scan. Register in Emergency Room as an Outpatient for your scan.  Driving directions to [id number]  [phone]  - more info    [address]  [place], [place] [address]     1. Head south on [address] toward [address]      0.5 mi    2. Sharp left onto [address]      0.6 mi    3. Turn left onto the [address] ramp      0.2 mi    4. Merge onto [address]      3.0 mi    5. Continue straight to stay on [address]      0.4 mi    6. Slight left to stay on [address]      1.2 mi    7. Turn right onto [address]      0.1 mi    8. Turn left onto [address]      361 ft    9. Take the 1st left onto [address]  Destination will be on the right

## 2012-08-08 ENCOUNTER — Ambulatory Visit
Admission: RE | Admit: 2012-08-08 | Discharge: 2012-08-08 | Disposition: A | Discharge status: Home / Self Care | Payer: Medicare Other | Source: Ambulatory Visit | Attending: Internal Medicine | Admitting: Internal Medicine

## 2012-08-08 DIAGNOSIS — Z1231 Encounter for screening mammogram for malignant neoplasm of breast: Secondary | ICD-10-CM

## 2013-04-23 ENCOUNTER — Other Ambulatory Visit: Payer: Self-pay | Admitting: Obstetrics and Gynecology

## 2013-04-23 ENCOUNTER — Other Ambulatory Visit (HOSPITAL_COMMUNITY)
Admission: RE | Admit: 2013-04-23 | Discharge: 2013-04-23 | Disposition: A | Discharge status: Home / Self Care | Payer: Medicare Other | Source: Ambulatory Visit | Attending: Obstetrics and Gynecology | Admitting: Obstetrics and Gynecology

## 2013-04-23 DIAGNOSIS — Z1151 Encounter for screening for human papillomavirus (HPV): Secondary | ICD-10-CM | POA: Insufficient documentation

## 2013-04-23 DIAGNOSIS — Z01419 Encounter for gynecological examination (general) (routine) without abnormal findings: Secondary | ICD-10-CM | POA: Insufficient documentation

## 2013-10-05 ENCOUNTER — Other Ambulatory Visit: Payer: Self-pay

## 2013-10-05 DIAGNOSIS — Z1231 Encounter for screening mammogram for malignant neoplasm of breast: Secondary | ICD-10-CM

## 2013-10-22 ENCOUNTER — Ambulatory Visit: Payer: Medicare Other

## 2013-10-23 ENCOUNTER — Ambulatory Visit: Payer: Self-pay

## 2013-11-05 ENCOUNTER — Ambulatory Visit
Admission: RE | Admit: 2013-11-05 | Discharge: 2013-11-05 | Disposition: A | Discharge status: Home / Self Care | Payer: Commercial Managed Care - HMO | Source: Ambulatory Visit

## 2013-11-05 DIAGNOSIS — Z1231 Encounter for screening mammogram for malignant neoplasm of breast: Secondary | ICD-10-CM

## 2014-03-29 ENCOUNTER — Encounter: Payer: Commercial Managed Care - HMO | Admitting: Family Medicine

## 2014-03-30 NOTE — Progress Notes (Deleted)
Subjective:    Patient ID: [name], female    DOB: 11-Jun-1941, 73 y.o.   MRN: [id number]  HPI    Review of Systems     Objective:   Physical Exam        Assessment & Plan:

## 2014-04-02 NOTE — Progress Notes (Signed)
This encounter was created in error - please disregard.

## 2014-08-21 ENCOUNTER — Encounter (HOSPITAL_COMMUNITY): Payer: Self-pay | Admitting: Emergency Medicine

## 2014-08-21 ENCOUNTER — Inpatient Hospital Stay (HOSPITAL_COMMUNITY)
Admission: EM | Admit: 2014-08-21 | Discharge: 2014-08-22 | DRG: 103 | Disposition: A | Discharge status: Home / Self Care | Payer: Commercial Managed Care - HMO | Attending: Internal Medicine | Admitting: Internal Medicine

## 2014-08-21 ENCOUNTER — Emergency Department (HOSPITAL_COMMUNITY): Payer: Commercial Managed Care - HMO

## 2014-08-21 DIAGNOSIS — I6522 Occlusion and stenosis of left carotid artery: Secondary | ICD-10-CM | POA: Diagnosis present

## 2014-08-21 DIAGNOSIS — Z9114 Patient's other noncompliance with medication regimen: Secondary | ICD-10-CM | POA: Diagnosis present

## 2014-08-21 DIAGNOSIS — I739 Peripheral vascular disease, unspecified: Secondary | ICD-10-CM | POA: Diagnosis present

## 2014-08-21 DIAGNOSIS — E041 Nontoxic single thyroid nodule: Secondary | ICD-10-CM | POA: Diagnosis present

## 2014-08-21 DIAGNOSIS — E785 Hyperlipidemia, unspecified: Secondary | ICD-10-CM | POA: Diagnosis present

## 2014-08-21 DIAGNOSIS — R471 Dysarthria and anarthria: Secondary | ICD-10-CM | POA: Diagnosis present

## 2014-08-21 DIAGNOSIS — Z79899 Other long term (current) drug therapy: Secondary | ICD-10-CM | POA: Diagnosis not present

## 2014-08-21 DIAGNOSIS — G43109 Migraine with aura, not intractable, without status migrainosus: Principal | ICD-10-CM

## 2014-08-21 DIAGNOSIS — R4781 Slurred speech: Secondary | ICD-10-CM | POA: Diagnosis present

## 2014-08-21 DIAGNOSIS — Z888 Allergy status to other drugs, medicaments and biological substances status: Secondary | ICD-10-CM

## 2014-08-21 DIAGNOSIS — Z8673 Personal history of transient ischemic attack (TIA), and cerebral infarction without residual deficits: Secondary | ICD-10-CM | POA: Diagnosis not present

## 2014-08-21 DIAGNOSIS — R7611 Nonspecific reaction to tuberculin skin test without active tuberculosis: Secondary | ICD-10-CM | POA: Diagnosis present

## 2014-08-21 DIAGNOSIS — R531 Weakness: Secondary | ICD-10-CM

## 2014-08-21 DIAGNOSIS — G8324 Monoplegia of upper limb affecting left nondominant side: Secondary | ICD-10-CM | POA: Diagnosis present

## 2014-08-21 DIAGNOSIS — I10 Essential (primary) hypertension: Secondary | ICD-10-CM | POA: Diagnosis present

## 2014-08-21 DIAGNOSIS — F329 Major depressive disorder, single episode, unspecified: Secondary | ICD-10-CM | POA: Diagnosis present

## 2014-08-21 DIAGNOSIS — K219 Gastro-esophageal reflux disease without esophagitis: Secondary | ICD-10-CM | POA: Diagnosis present

## 2014-08-21 DIAGNOSIS — F419 Anxiety disorder, unspecified: Secondary | ICD-10-CM | POA: Diagnosis present

## 2014-08-21 DIAGNOSIS — G629 Polyneuropathy, unspecified: Secondary | ICD-10-CM | POA: Diagnosis present

## 2014-08-21 DIAGNOSIS — G459 Transient cerebral ischemic attack, unspecified: Secondary | ICD-10-CM | POA: Diagnosis not present

## 2014-08-21 DIAGNOSIS — M5136 Other intervertebral disc degeneration, lumbar region: Secondary | ICD-10-CM | POA: Diagnosis present

## 2014-08-21 DIAGNOSIS — K2270 Barrett's esophagus without dysplasia: Secondary | ICD-10-CM | POA: Diagnosis present

## 2014-08-21 DIAGNOSIS — R202 Paresthesia of skin: Secondary | ICD-10-CM | POA: Diagnosis present

## 2014-08-21 DIAGNOSIS — Z86010 Personal history of colonic polyps: Secondary | ICD-10-CM | POA: Diagnosis not present

## 2014-08-21 DIAGNOSIS — Z7982 Long term (current) use of aspirin: Secondary | ICD-10-CM

## 2014-08-21 DIAGNOSIS — Z8249 Family history of ischemic heart disease and other diseases of the circulatory system: Secondary | ICD-10-CM

## 2014-08-21 DIAGNOSIS — M5030 Other cervical disc degeneration, unspecified cervical region: Secondary | ICD-10-CM | POA: Diagnosis present

## 2014-08-21 DIAGNOSIS — Z880 Allergy status to penicillin: Secondary | ICD-10-CM | POA: Diagnosis not present

## 2014-08-21 DIAGNOSIS — N359 Urethral stricture, unspecified: Secondary | ICD-10-CM | POA: Diagnosis present

## 2014-08-21 DIAGNOSIS — R200 Anesthesia of skin: Secondary | ICD-10-CM | POA: Diagnosis present

## 2014-08-21 DIAGNOSIS — Z823 Family history of stroke: Secondary | ICD-10-CM | POA: Diagnosis not present

## 2014-08-21 DIAGNOSIS — G8929 Other chronic pain: Secondary | ICD-10-CM | POA: Diagnosis present

## 2014-08-21 DIAGNOSIS — G451 Carotid artery syndrome (hemispheric): Secondary | ICD-10-CM

## 2014-08-21 LAB — BASIC METABOLIC PANEL
Anion gap: 4 — ABNORMAL LOW (ref 5–15)
BUN: 10 mg/dL (ref 6–23)
CO2: 32 mmol/L (ref 19–32)
Calcium: 9.7 mg/dL (ref 8.4–10.5)
Chloride: 104 mmol/L (ref 96–112)
Creatinine, Ser: 0.88 mg/dL (ref 0.50–1.10)
GFR calc Af Amer: 73 mL/min — ABNORMAL LOW (ref >90)
GFR calc non Af Amer: 63 mL/min — ABNORMAL LOW (ref >90)
Glucose, Bld: 102 mg/dL — ABNORMAL HIGH (ref 70–99)
Potassium: 4.6 mmol/L (ref 3.5–5.1)
Sodium: 140 mmol/L (ref 135–145)

## 2014-08-21 LAB — URINALYSIS, ROUTINE W REFLEX MICROSCOPIC
Bilirubin Urine: NEGATIVE
Glucose, UA: NEGATIVE mg/dL
Hgb urine dipstick: NEGATIVE
Ketones, ur: NEGATIVE mg/dL
Leukocytes, UA: NEGATIVE
Nitrite: NEGATIVE
Protein, ur: NEGATIVE mg/dL
Specific Gravity, Urine: 1.005 (ref 1.005–1.030)
Urobilinogen, UA: 0.2 mg/dL (ref 0.0–1.0)
pH: 7.5 (ref 5.0–8.0)

## 2014-08-21 LAB — CBC WITH DIFFERENTIAL/PLATELET
Basophils Absolute: 0 10*3/uL (ref 0.0–0.1)
Basophils Relative: 1 % (ref 0–1)
Eosinophils Absolute: 0.3 10*3/uL (ref 0.0–0.7)
Eosinophils Relative: 7 % — ABNORMAL HIGH (ref 0–5)
HCT: 43.2 % (ref 36.0–46.0)
Hemoglobin: 14.3 g/dL (ref 12.0–15.0)
Lymphocytes Relative: 56 % — ABNORMAL HIGH (ref 12–46)
Lymphs Abs: 2.9 10*3/uL (ref 0.7–4.0)
MCH: 31.4 pg (ref 26.0–34.0)
MCHC: 33.1 g/dL (ref 30.0–36.0)
MCV: 94.9 fL (ref 78.0–100.0)
Monocytes Absolute: 0.4 10*3/uL (ref 0.1–1.0)
Monocytes Relative: 8 % (ref 3–12)
Neutro Abs: 1.4 10*3/uL — ABNORMAL LOW (ref 1.7–7.7)
Neutrophils Relative %: 28 % — ABNORMAL LOW (ref 43–77)
Platelets: 150 10*3/uL (ref 150–400)
RBC: 4.55 MIL/uL (ref 3.87–5.11)
RDW: 14.9 % (ref 11.5–15.5)
WBC: 5 10*3/uL (ref 4.0–10.5)

## 2014-08-21 LAB — I-STAT TROPONIN, ED: Troponin i, poc: 0 ng/mL (ref 0.00–0.08)

## 2014-08-21 MED ORDER — STROKE: EARLY STAGES OF RECOVERY BOOK
Freq: Once | Status: AC
  Administered 2014-08-22: 1
  Filled 2014-08-21: qty 1

## 2014-08-21 MED ORDER — ASPIRIN 300 MG RE SUPP: 300.0000 mg | Freq: Every day | RECTAL | Status: DC

## 2014-08-21 MED ORDER — HEPARIN SODIUM (PORCINE) 5000 UNIT/ML IJ SOLN
5000.0000 [IU] | Freq: Three times a day (TID) | INTRAMUSCULAR | Status: DC
  Filled 2014-08-21: qty 1

## 2014-08-21 MED ORDER — ASPIRIN 325 MG PO TABS
325.0000 mg | ORAL_TABLET | Freq: Every day | ORAL | Status: DC
  Administered 2014-08-22: 325 mg via ORAL
  Filled 2014-08-21: qty 1

## 2014-08-21 MED ORDER — METRONIDAZOLE 500 MG PO TABS
500.0000 mg | ORAL_TABLET | Freq: Two times a day (BID) | ORAL | Status: DC
  Administered 2014-08-22 (×2): 500 mg via ORAL
  Filled 2014-08-21 (×2): qty 1

## 2014-08-21 MED ORDER — ASPIRIN 81 MG PO CHEW: 81.0000 mg | CHEWABLE_TABLET | Freq: Every day | ORAL | Status: DC

## 2014-08-21 MED ORDER — NAPROXEN 250 MG PO TABS: 500.0000 mg | ORAL_TABLET | Freq: Every day | ORAL | Status: DC | PRN

## 2014-08-21 MED ORDER — ROSUVASTATIN CALCIUM 20 MG PO TABS
20.0000 mg | ORAL_TABLET | Freq: Every day | ORAL | Status: DC
  Filled 2014-08-21: qty 1

## 2014-08-21 MED ORDER — TRAZODONE HCL 50 MG PO TABS
50.0000 mg | ORAL_TABLET | Freq: Every day | ORAL | Status: DC
  Filled 2014-08-21: qty 1

## 2014-08-21 NOTE — Consult Note (Signed)
Referring Physician: Dr. Alcario Drought    Chief Complaint: left arm tingling-pain-weakness, left face tingling-pain, worsening dysarthria  HPI:                                                                                                                                         [name] is an 74 y.o. female with multiple medical problems including hyperlipidemia, carotid artery disease, TIA, PVD, depression, brought in via EMS for further evaluation of the above stated symptoms. She said that she woke up this morning feeling tired and then around 4 pm developed sudden onset of pain-tingling and weakness of the left arm as well as " a swollen-pain-tingly sensation in my left lip that moved up to the cheek area".She reportedly has slurred speech at baseline but noted to have worsening speech. No symptoms involving the left leg. Patient said that she became very concerned, took 2 aspirins, and called her son on the phone and decision was made to call ambulance. [name] stated that the weakness-tingling of the left arm lasted for approximately 3 hours but still experiencing " a tight sensation and tingling " of the left face and her speech is not back to baseline. Denies HA, vertigo, double vision, difficulty swallowing, imbalance, confusion, language or vision impairment. Takes aspirin 81 mg daily. CT brain reviewed by myself revealed no acute abnormality. Of note, her last CUS 4/12 showed 60 to 79% stenosis left ICA and 0-39% stenosis right ICA.  Date last known well: 08/21/14 Time last known well: uncertain tPA Given: no, out of the window   Past Medical History  Diagnosis Date  . Impaired glucose tolerance 01/14/2011  . Acute sinus infection 01/15/2011  . ANXIETY 10/03/2009  . BARRETTS ESOPHAGUS 10/03/2009  . CAROTID ARTERY DISEASE 08/31/2009  . COLONIC POLYPS, HX OF 08/29/2009  . CONSTIPATION, CHRONIC 10/03/2009  . DEPRESSION 10/03/2009  . [place] DISEASE, CERVICAL 08/29/2009  . [place] DISEASE,  LUMBAR 08/29/2009  . Eustachian tube dysfunction 01/15/2011  . GERD 08/29/2009  . HYPERLIPIDEMIA 08/29/2009  . LEG PAIN, BILATERAL 08/29/2009  . LOW BACK PAIN, CHRONIC 11/22/2009  . PERIPHERAL NEUROPATHY 10/03/2009  . PERIPHERAL VASCULAR DISEASE 08/29/2009  . POSITIVE PPD 10/03/2009  . Preventative health care 01/14/2011  . TIA 11/22/2009  . TRANSIENT ISCHEMIC ATTACK, HX OF 08/29/2009  . URETHRAL STRICTURE 10/03/2009  . VOCAL CORD POLYP, HX OF 10/03/2009  . WRIST PAIN, BILATERAL 05/25/2010  . Thyroid nodule 08/09/2011    Past Surgical History  Procedure Laterality Date  . S/p vocal cord polyps  1984    chronic hoarseness  . Tubal ligation    . Stress test negative  2003,2004,2006  . Hx of right ankle fracture      Family History  Problem Relation Age of Onset  . Stroke Mother   . Heart disease Mother   . Hypertension Mother   . Diabetes Mother   .  Multiple myeloma Sister   . Cancer Other     lung   Social History:  reports that she has never smoked. She has never used smokeless tobacco. She reports that she does not drink alcohol or use illicit drugs.  Allergies:  Allergies  Allergen Reactions  . Atorvastatin     REACTION: myalgia  . Penicillins Hives    Medications:                                                                                                                           Scheduled: .  stroke: mapping our early stages of recovery book   Does not apply Once  . aspirin  300 mg Rectal Daily   Or  . aspirin  325 mg Oral Daily  . heparin  5,000 Units Subcutaneous 3 times per day  . metroNIDAZOLE  500 mg Oral BID  . rosuvastatin  20 mg Oral Daily  . traZODone  50 mg Oral QHS    ROS:                                                                                                                                       History obtained from the patient and chart review  General ROS: negative for - chills, fatigue, fever, night sweats, weight gain or weight  loss Psychological ROS: negative for - behavioral disorder, hallucinations, memory difficulties, mood swings or suicidal ideation Ophthalmic ROS: negative for - blurry vision, double vision, eye pain or loss of vision ENT ROS: negative for - epistaxis, nasal discharge, oral lesions, sore throat, tinnitus or vertigo Allergy and Immunology ROS: negative for - hives or itchy/watery eyes Hematological and Lymphatic ROS: negative for - bleeding problems, bruising or swollen lymph nodes Endocrine ROS: negative for - galactorrhea, hair pattern changes, polydipsia/polyuria or temperature intolerance Respiratory ROS: negative for - cough, hemoptysis, shortness of breath or wheezing Cardiovascular ROS: negative for - chest pain, dyspnea on exertion, edema or irregular heartbeat Gastrointestinal ROS: negative for - abdominal pain, diarrhea, hematemesis, nausea/vomiting or stool incontinence Genito-Urinary ROS: negative for - dysuria, hematuria, incontinence or urinary frequency/urgency Musculoskeletal ROS: negative for - joint swelling or muscular weakness Neurological ROS: as noted in HPI Dermatological ROS: negative for rash and skin lesion changes  Physical exam: pleasant female in no apparent distress.Blood pressure 147/71, pulse 55, temperature 98 F (36.7 C), temperature source  Oral, resp. rate 14, SpO2 100 %.  Head: normocephalic. Neck: supple, no bruits, no JVD. Cardiac: no murmurs. Lungs: clear. Abdomen: soft, no tender, no mass. Extremities: no edema. Skin: no rash Neurologic Examination:                                                                                                      General: Mental Status: Alert, oriented, thought content appropriate. Mild dysarthia without evidence of aphasia.  Able to follow 3 step commands without difficulty. Cranial Nerves: II: Discs flat bilaterally; Visual fields grossly normal, pupils equal, round, reactive to light and accommodation III,IV,  VI: ptosis not present, extra-ocular motions intact bilaterally V,VII: smile symmetric, facial light touch sensation normal bilaterally VIII: hearing normal bilaterally IX,X: gag reflex present XI: bilateral shoulder shrug XII: midline tongue extension without atrophy or fasciculations Motor: Right : Upper extremity   5/5    Left:     Upper extremity   5/5  Lower extremity   5/5     Lower extremity   5/5 Tone and bulk:normal tone throughout; no atrophy noted Sensory: Pinprick and light touch intact throughout, bilaterally Deep Tendon Reflexes:  Right: Upper Extremity   Left: Upper extremity   biceps (C-5 to C-6) 2/4   biceps (C-5 to C-6) 2/4 tricep (C7) 2/4    triceps (C7) 2/4 Brachioradialis (C6) 2/4  Brachioradialis (C6) 2/4  Lower Extremity Lower Extremity  quadriceps (L-2 to L-4) 2/4   quadriceps (L-2 to L-4) 2/4 Achilles (S1) 2/4   Achilles (S1) 2/4  Plantars: Right: downgoing   Left: downgoing Cerebellar: normal finger-to-nose,  normal heel-to-shin test Gait:  No tested CV: pulses palpable throughout    Results for orders placed or performed during the hospital encounter of 08/21/14 (from the past 48 hour(s))  CBC with Differential     Status: Abnormal   Collection Time: 08/21/14  9:53 PM  Result Value Ref Range   WBC 5.0 4.0 - 10.5 K/uL   RBC 4.55 3.87 - 5.11 MIL/uL   Hemoglobin 14.3 12.0 - 15.0 g/dL   HCT 43.2 36.0 - 46.0 %   MCV 94.9 78.0 - 100.0 fL   MCH 31.4 26.0 - 34.0 pg   MCHC 33.1 30.0 - 36.0 g/dL   RDW 14.9 11.5 - 15.5 %   Platelets 150 150 - 400 K/uL   Neutrophils Relative % 28 (L) 43 - 77 %   Neutro Abs 1.4 (L) 1.7 - 7.7 K/uL   Lymphocytes Relative 56 (H) 12 - 46 %   Lymphs Abs 2.9 0.7 - 4.0 K/uL   Monocytes Relative 8 3 - 12 %   Monocytes Absolute 0.4 0.1 - 1.0 K/uL   Eosinophils Relative 7 (H) 0 - 5 %   Eosinophils Absolute 0.3 0.0 - 0.7 K/uL   Basophils Relative 1 0 - 1 %   Basophils Absolute 0.0 0.0 - 0.1 K/uL  Basic metabolic panel      Status: Abnormal   Collection Time: 08/21/14  9:53 PM  Result Value Ref Range   Sodium 140 135 - 145  mmol/L   Potassium 4.6 3.5 - 5.1 mmol/L   Chloride 104 96 - 112 mmol/L   CO2 32 19 - 32 mmol/L   Glucose, Bld 102 (H) 70 - 99 mg/dL   BUN 10 6 - 23 mg/dL   Creatinine, Ser 0.88 0.50 - 1.10 mg/dL   Calcium 9.7 8.4 - 10.5 mg/dL   GFR calc non Af Amer 63 (L) >90 mL/min   GFR calc Af Amer 73 (L) >90 mL/min    Comment: (NOTE) The eGFR has been calculated using the CKD EPI equation. This calculation has not been validated in all clinical situations. eGFR's persistently <90 mL/min signify possible Chronic Kidney Disease.    Anion gap 4 (L) 5 - 15  I-Stat Troponin, ED (not at [facility])     Status: None   Collection Time: 08/21/14 10:06 PM  Result Value Ref Range   Troponin i, poc 0.00 0.00 - 0.08 ng/mL   Comment 3            Comment: Due to the release kinetics of cTnI, a negative result within the first hours of the onset of symptoms does not rule out myocardial infarction with certainty. If myocardial infarction is still suspected, repeat the test at appropriate intervals.   Urinalysis, Routine w reflex microscopic     Status: Abnormal   Collection Time: 08/21/14 10:51 PM  Result Value Ref Range   Color, Urine STRAW (A) YELLOW   APPearance CLEAR CLEAR   Specific Gravity, Urine 1.005 1.005 - 1.030   pH 7.5 5.0 - 8.0   Glucose, UA NEGATIVE NEGATIVE mg/dL   Hgb urine dipstick NEGATIVE NEGATIVE   Bilirubin Urine NEGATIVE NEGATIVE   Ketones, ur NEGATIVE NEGATIVE mg/dL   Protein, ur NEGATIVE NEGATIVE mg/dL   Urobilinogen, UA 0.2 0.0 - 1.0 mg/dL   Nitrite NEGATIVE NEGATIVE   Leukocytes, UA NEGATIVE NEGATIVE    Comment: MICROSCOPIC NOT DONE ON URINES WITH NEGATIVE PROTEIN, BLOOD, LEUKOCYTES, NITRITE, OR GLUCOSE <1000 mg/dL.   Dg Chest 2 View  08/21/2014   CLINICAL DATA:  Weakness  EXAM: CHEST  2 VIEW  COMPARISON:  07/26/2012  FINDINGS: Normal heart size and mediastinal contours. There  is chronic mild deviation of the upper thoracic trachea to the right. No acute infiltrate or edema. No effusion or pneumothorax. No acute osseous findings.  IMPRESSION: No active cardiopulmonary disease.   Electronically Signed   By: Jorje Guild M.D.   On: 08/21/2014 22:31   Ct Head Wo Contrast  08/21/2014   CLINICAL DATA:  Acute onset of left-sided weakness, left facial numbness and slurred speech. Headache. Initial encounter.  EXAM: CT HEAD WITHOUT CONTRAST  TECHNIQUE: Contiguous axial images were obtained from the base of the skull through the vertex without intravenous contrast.  COMPARISON:  CT of the head performed 07/26/2012  FINDINGS: There is no evidence of acute infarction, mass lesion, or intra- or extra-axial hemorrhage on CT.  Prominence of the sulci suggests mild cortical volume loss. Cerebellar atrophy is noted. Mild periventricular white matter change likely reflects small vessel ischemic microangiopathy.  The brainstem and fourth ventricle are within normal limits. The basal ganglia are unremarkable in appearance. The cerebral hemispheres demonstrate grossly normal gray-white differentiation. No mass effect or midline shift is seen.  There is no evidence of fracture; visualized osseous structures are unremarkable in appearance. The visualized portions of the orbits are within normal limits. The paranasal sinuses and mastoid air cells are well-aerated. No significant soft tissue abnormalities are seen.  IMPRESSION: 1. No acute intracranial pathology seen on CT. 2. Mild cortical volume loss and scattered small vessel ischemic microangiopathy.   Electronically Signed   By: Garald Balding M.D.   On: 08/21/2014 22:43    Assessment: 74 y.o. female admitted to [facility] due to left arm tingling-pain-weakness (resolved), and persisting left face tingling-pain with worsening dysarthria. Neuro-exam significant for mild dysarthria. CT brain unremarkable for acute abnormality. Previously documented 60-79%  stenosis left ICA doesn't seem to be the culprit vessel at this moment, as patient symptoms are ipsilateral to the stenosis. Her symptoms are not fully resolved, hence concern for right ischemic infarct. Recommend stroke/TIA work up. Continue aspirin pending neuro testing. Stroke team will follow up.  Stroke Risk Factors - age, hyperlipidemia, carotid stenosis, TIA.  Plan: 1. HgbA1c, fasting lipid panel 2. MRI, MRA  of the brain without contrast 3. Echocardiogram 4. Carotid dopplers 5. Prophylactic therapy-aspirin after passing swallowing eval 6. Risk factor modification 7. Telemetry monitoring 8. Frequent neuro checks 9. PT/OT SLP (no needed at this time)   Dorian Pod, MD [facility] [phone]  08/21/2014, 11:54 PM

## 2014-08-21 NOTE — H&P (Signed)
[facility] History and Physical  HOLLEY PRUETT NWG:[id number] DOB: [birth year] DOA: 08/21/2014  Referring physician: EDP PCP: Gwynneth Aliment, MD   Chief Complaint: Left sided numbness / weakness   HPI: [name] is a 74 y.o. female who presents to the ED after an episode of LEFT facial numbness, slurred speech, left arm heaviness and unsteady gait.  Symptoms occurred after waking up from a nap this afternoon.  Lasted about 1 hour then resolved.  She presents to the ED today after these symptoms with concern for CVA.  Her symptoms are completely resolved at this time.  She has a history of LEFT sided carotid artery stenosis in the 60-79% range, her RIGHT carotid artery has no significant known stenosis.  Review of Systems: Systems reviewed.  As above, otherwise negative  Past Medical History  Diagnosis Date  . Impaired glucose tolerance 01/14/2011  . Acute sinus infection 01/15/2011  . ANXIETY 10/03/2009  . BARRETTS ESOPHAGUS 10/03/2009  . CAROTID ARTERY DISEASE 08/31/2009  . COLONIC POLYPS, HX OF 08/29/2009  . CONSTIPATION, CHRONIC 10/03/2009  . DEPRESSION 10/03/2009  . DISC DISEASE, CERVICAL 08/29/2009  . DISC DISEASE, LUMBAR 08/29/2009  . Eustachian tube dysfunction 01/15/2011  . GERD 08/29/2009  . HYPERLIPIDEMIA 08/29/2009  . LEG PAIN, BILATERAL 08/29/2009  . LOW BACK PAIN, CHRONIC 11/22/2009  . PERIPHERAL NEUROPATHY 10/03/2009  . PERIPHERAL VASCULAR DISEASE 08/29/2009  . POSITIVE PPD 10/03/2009  . Preventative health care 01/14/2011  . TIA 11/22/2009  . TRANSIENT ISCHEMIC ATTACK, HX OF 08/29/2009  . URETHRAL STRICTURE 10/03/2009  . VOCAL CORD POLYP, HX OF 10/03/2009  . WRIST PAIN, BILATERAL 05/25/2010  . Thyroid nodule 08/09/2011   Past Surgical History  Procedure Laterality Date  . S/p vocal cord polyps  1984    chronic hoarseness  . Tubal ligation    . Stress test negative  2003,2004,2006  . Hx of right ankle fracture     Social History:  reports that she has never smoked.  She has never used smokeless tobacco. She reports that she does not drink alcohol or use illicit drugs.  Allergies  Allergen Reactions  . Atorvastatin     REACTION: myalgia  . Penicillins Hives    Family History  Problem Relation Age of Onset  . Stroke Mother   . Heart disease Mother   . Hypertension Mother   . Diabetes Mother   . Multiple myeloma Sister   . Cancer Other     lung     Prior to Admission medications   Medication Sig Start Date End Date Taking? Authorizing Provider  aspirin 81 MG chewable tablet Chew 81 mg by mouth daily.   Yes Historical Provider, MD  CALCIUM PO Take 1 tablet by mouth daily.    Yes Historical Provider, MD  Cholecalciferol (VITAMIN D PO) Take 1 tablet by mouth daily.    Yes Historical Provider, MD  CRESTOR 20 MG tablet TAKE ONE TABLET BY MOUTH EVERY DAY 11/14/10  Yes Corwin Levins, MD  metroNIDAZOLE (FLAGYL) 500 MG tablet Take 500 mg by mouth 2 (two) times daily. 08/18/14  Yes Historical Provider, MD  traZODone (DESYREL) 50 MG tablet Take 50 mg by mouth at bedtime.   Yes Historical Provider, MD  VITAMIN E PO Take 1 tablet by mouth daily.    Yes Historical Provider, MD  Naproxen Sodium (ALEVE) 220 MG CAPS Take 2 capsules by mouth daily as needed (pain).     Historical Provider, MD   Physical Exam: Filed Vitals:  08/21/14 2324  BP:   Pulse:   Temp: 98 F (36.7 C)  Resp:     BP 147/71 mmHg  Pulse 55  Temp(Src) 98 F (36.7 C) (Oral)  Resp 14  SpO2 100%  General Appearance:    Alert, oriented, no distress, appears stated age  Head:    Normocephalic, atraumatic  Eyes:    PERRL, EOMI, sclera non-icteric        Nose:   Nares without drainage or epistaxis. Mucosa, turbinates normal  Throat:   Moist mucous membranes. Oropharynx without erythema or exudate.  Neck:   Supple. No carotid bruits.  No thyromegaly.  No lymphadenopathy.   Back:     No CVA tenderness, no spinal tenderness  Lungs:     Clear to auscultation bilaterally, without wheezes,  rhonchi or rales  Chest wall:    No tenderness to palpitation  Heart:    Regular rate and rhythm without murmurs, gallops, rubs  Abdomen:     Soft, non-tender, nondistended, normal bowel sounds, no organomegaly  Genitalia:    deferred  Rectal:    deferred  Extremities:   No clubbing, cyanosis or edema.  Pulses:   2+ and symmetric all extremities  Skin:   Skin color, texture, turgor normal, no rashes or lesions  Lymph nodes:   Cervical, supraclavicular, and axillary nodes normal  Neurologic:   CNII-XII intact. Normal strength, sensation and reflexes      throughout    Labs on Admission:  Basic Metabolic Panel:  Recent Labs Lab 08/21/14 2153  NA 140  K 4.6  CL 104  CO2 32  GLUCOSE 102*  BUN 10  CREATININE 0.88  CALCIUM 9.7   Liver Function Tests: No results for input(s): AST, ALT, ALKPHOS, BILITOT, PROT, ALBUMIN in the last 168 hours. No results for input(s): LIPASE, AMYLASE in the last 168 hours. No results for input(s): AMMONIA in the last 168 hours. CBC:  Recent Labs Lab 08/21/14 2153  WBC 5.0  NEUTROABS 1.4*  HGB 14.3  HCT 43.2  MCV 94.9  PLT 150   Cardiac Enzymes: No results for input(s): CKTOTAL, CKMB, CKMBINDEX, TROPONINI in the last 168 hours.  BNP (last 3 results) No results for input(s): PROBNP in the last 8760 hours. CBG: No results for input(s): GLUCAP in the last 168 hours.  Radiological Exams on Admission: Dg Chest 2 View  08/21/2014   CLINICAL DATA:  Weakness  EXAM: CHEST  2 VIEW  COMPARISON:  07/26/2012  FINDINGS: Normal heart size and mediastinal contours. There is chronic mild deviation of the upper thoracic trachea to the right. No acute infiltrate or edema. No effusion or pneumothorax. No acute osseous findings.  IMPRESSION: No active cardiopulmonary disease.   Electronically Signed   By: Tiburcio Pea M.D.   On: 08/21/2014 22:31   Ct Head Wo Contrast  08/21/2014   CLINICAL DATA:  Acute onset of left-sided weakness, left facial numbness  and slurred speech. Headache. Initial encounter.  EXAM: CT HEAD WITHOUT CONTRAST  TECHNIQUE: Contiguous axial images were obtained from the base of the skull through the vertex without intravenous contrast.  COMPARISON:  CT of the head performed 07/26/2012  FINDINGS: There is no evidence of acute infarction, mass lesion, or intra- or extra-axial hemorrhage on CT.  Prominence of the sulci suggests mild cortical volume loss. Cerebellar atrophy is noted. Mild periventricular white matter change likely reflects small vessel ischemic microangiopathy.  The brainstem and fourth ventricle are within normal limits. The basal ganglia are  unremarkable in appearance. The cerebral hemispheres demonstrate grossly normal gray-white differentiation. No mass effect or midline shift is seen.  There is no evidence of fracture; visualized osseous structures are unremarkable in appearance. The visualized portions of the orbits are within normal limits. The paranasal sinuses and mastoid air cells are well-aerated. No significant soft tissue abnormalities are seen.  IMPRESSION: 1. No acute intracranial pathology seen on CT. 2. Mild cortical volume loss and scattered small vessel ischemic microangiopathy.   Electronically Signed   By: Roanna Raider M.D.   On: 08/21/2014 22:43    EKG: Independently reviewed.  Assessment/Plan Active Problems:   TIA (transient ischemic attack)   1. TIA - LEFT sided symptoms which would correspond to a RIGHT sided event.  Patient has a history of LEFT sided carotid stenosis in the 60-79% range and a widely patent RIGHT carotid artery. 1. MRI / MRA 2. 2d echo 3. Carotid dopplers 4. Seems somewhat less likely that her LEFT sided carotid stenosis is responsible for what sounds like it may be a RIGHT sided TIA, but will leave the final determination of this to neurology of course. 5. ASA 325 daily    Code Status: Full Code  Family Communication: Family in room Disposition Plan: Admit to  inpatient   Time spent: 70 min  Tavis Kring M. [facility] Pager [phone]  If 7AM-7PM, please contact the day team taking care of the patient [email or web address] Password [facility] 08/21/2014, 11:43 PM

## 2014-08-21 NOTE — ED Provider Notes (Signed)
CSN: [id number]     Arrival date & time 08/21/14  2145 History   First MD Initiated Contact with Patient 08/21/14 2151     Chief Complaint  Patient presents with  . Weakness     (Consider location/radiation/quality/duration/timing/severity/associated sxs/prior Treatment) The history is provided by the patient. No language interpreter was used.   Patient is a 74 year old African-American female with pertinent past medical history of left internal carotid stenosis and baseline slurred speech who comes to the emergency department today with tingling to the left side of the face, left arm heaviness, and difficulty with ambulation after waking up from a nap this afternoon. Patient's symptoms lasted approximately an hour and then resolved. [facility] emergency Department today concerned that she has had a CVA. She has no numbness or tingling.  Her symptoms are all resolved at this time.  She has had a carotid doppler several years ago and an MRI at that time with no further work-up since then.    Past Medical History  Diagnosis Date  . Impaired glucose tolerance 01/14/2011  . Acute sinus infection 01/15/2011  . ANXIETY 10/03/2009  . BARRETTS ESOPHAGUS 10/03/2009  . CAROTID ARTERY DISEASE 08/31/2009  . COLONIC POLYPS, HX OF 08/29/2009  . CONSTIPATION, CHRONIC 10/03/2009  . DEPRESSION 10/03/2009  . [place] DISEASE, CERVICAL 08/29/2009  . [place] DISEASE, LUMBAR 08/29/2009  . Eustachian tube dysfunction 01/15/2011  . GERD 08/29/2009  . HYPERLIPIDEMIA 08/29/2009  . LEG PAIN, BILATERAL 08/29/2009  . LOW BACK PAIN, CHRONIC 11/22/2009  . PERIPHERAL NEUROPATHY 10/03/2009  . PERIPHERAL VASCULAR DISEASE 08/29/2009  . POSITIVE PPD 10/03/2009  . Preventative health care 01/14/2011  . TIA 11/22/2009  . TRANSIENT ISCHEMIC ATTACK, HX OF 08/29/2009  . URETHRAL STRICTURE 10/03/2009  . VOCAL CORD POLYP, HX OF 10/03/2009  . WRIST PAIN, BILATERAL 05/25/2010  . Thyroid nodule 08/09/2011   Past Surgical History  Procedure Laterality Date  .  S/p vocal cord polyps  1984    chronic hoarseness  . Tubal ligation    . Stress test negative  2003,2004,2006  . Hx of right ankle fracture     Family History  Problem Relation Age of Onset  . Stroke Mother   . Heart disease Mother   . Hypertension Mother   . Diabetes Mother   . Multiple myeloma Sister   . Cancer Other     lung   History  Substance Use Topics  . Smoking status: Never Smoker   . Smokeless tobacco: Never Used  . Alcohol Use: No   OB History    No data available     Review of Systems  Constitutional: Negative for fever and chills.  Respiratory: Negative for cough and shortness of breath.   Gastrointestinal: Negative for nausea, vomiting and abdominal pain.  Genitourinary: Negative for dysuria, urgency and frequency.  Musculoskeletal: Negative for myalgias, joint swelling and neck pain.  Neurological: Positive for speech difficulty (baseline). Negative for facial asymmetry and weakness.  All other systems reviewed and are negative.     Allergies  Atorvastatin and Penicillins  Home Medications   Prior to Admission medications   Medication Sig Start Date End Date Taking? Authorizing Provider  aspirin 81 MG chewable tablet Chew 81 mg by mouth daily.   Yes Historical Provider, MD  CALCIUM PO Take 1 tablet by mouth daily.    Yes Historical Provider, MD  Cholecalciferol (VITAMIN D PO) Take 1 tablet by mouth daily.    Yes Historical Provider, MD  CRESTOR 20 MG tablet  TAKE ONE TABLET BY MOUTH EVERY DAY 11/14/10  Yes Biagio Borg, MD  metroNIDAZOLE (FLAGYL) 500 MG tablet Take 500 mg by mouth 2 (two) times daily. 08/18/14  Yes Historical Provider, MD  traZODone (DESYREL) 50 MG tablet Take 50 mg by mouth at bedtime.   Yes Historical Provider, MD  VITAMIN E PO Take 1 tablet by mouth daily.    Yes Historical Provider, MD  benzonatate (TESSALON) 200 MG capsule Take 1 capsule (200 mg total) by mouth 3 (three) times daily as needed for cough. Patient not taking:  Reported on 08/21/2014 07/26/12   Shawnee Knapp, MD  levofloxacin (LEVAQUIN) 750 MG tablet Take 1 tablet (750 mg total) by mouth daily. Patient not taking: Reported on 08/21/2014 07/26/12   Shawnee Knapp, MD  methocarbamol (ROBAXIN) 500 MG tablet Take 1 tablet (500 mg total) by mouth 2 (two) times daily. Patient not taking: Reported on 08/21/2014 06/02/12   Montine Circle, PA-C  Naproxen Sodium (ALEVE) 220 MG CAPS Take 2 capsules by mouth daily as needed (pain).     Historical Provider, MD  omeprazole (PRILOSEC) 20 MG capsule TAKE TWO CAPSULES BY MOUTH EVERY DAY Patient not taking: Reported on 08/21/2014 03/29/11   Biagio Borg, MD  promethazine-codeine [facility] WITH CODEINE) 6.25-10 MG/5ML syrup Take 5 mLs by mouth every 4 (four) hours as needed for cough. Patient not taking: Reported on 08/21/2014 07/26/12   Shawnee Knapp, MD   BP 147/71 mmHg  Pulse 55  Temp(Src) 98 F (36.7 C) (Oral)  Resp 14  SpO2 100% Physical Exam  Constitutional: She is oriented to person, place, and time. She appears well-developed and well-nourished. No distress.  HENT:  Head: Normocephalic and atraumatic.  Eyes: Pupils are equal, round, and reactive to light.  Neck: Normal range of motion.  Cardiovascular: Normal rate, regular rhythm, normal heart sounds and intact distal pulses.   Pulmonary/Chest: Effort normal. No respiratory distress. She has no wheezes. She exhibits no tenderness.  Abdominal: Soft. Bowel sounds are normal. She exhibits no distension. There is no tenderness. There is no rebound and no guarding.  Neurological: She is alert and oriented to person, place, and time. She has normal strength. A cranial nerve deficit is present. No sensory deficit. She exhibits normal muscle tone. Coordination and gait normal.  Slurred speech at baseline per daughter. Strength 5/5 bilateral upper and lower extremities.  Sensation intact x4 extremities.  Normal gait with no ataxia.    Skin: Skin is warm and dry.  Nursing note  and vitals reviewed.   ED Course  Procedures (including critical care time) Labs Review Labs Reviewed  CBC WITH DIFFERENTIAL/PLATELET - Abnormal; Notable for the following:    Neutrophils Relative % 28 (*)    Neutro Abs 1.4 (*)    Lymphocytes Relative 56 (*)    Eosinophils Relative 7 (*)    All other components within normal limits  BASIC METABOLIC PANEL - Abnormal; Notable for the following:    Glucose, Bld 102 (*)    GFR calc non Af Amer 63 (*)    GFR calc Af Amer 73 (*)    Anion gap 4 (*)    All other components within normal limits  URINALYSIS, ROUTINE W REFLEX MICROSCOPIC  I-STAT TROPOININ, ED    Imaging Review Dg Chest 2 View  08/21/2014   CLINICAL DATA:  Weakness  EXAM: CHEST  2 VIEW  COMPARISON:  07/26/2012  FINDINGS: Normal heart size and mediastinal contours. There is chronic mild  deviation of the upper thoracic trachea to the right. No acute infiltrate or edema. No effusion or pneumothorax. No acute osseous findings.  IMPRESSION: No active cardiopulmonary disease.   Electronically Signed   By: Jorje Guild M.D.   On: 08/21/2014 22:31   Ct Head Wo Contrast  08/21/2014   CLINICAL DATA:  Acute onset of left-sided weakness, left facial numbness and slurred speech. Headache. Initial encounter.  EXAM: CT HEAD WITHOUT CONTRAST  TECHNIQUE: Contiguous axial images were obtained from the base of the skull through the vertex without intravenous contrast.  COMPARISON:  CT of the head performed 07/26/2012  FINDINGS: There is no evidence of acute infarction, mass lesion, or intra- or extra-axial hemorrhage on CT.  Prominence of the sulci suggests mild cortical volume loss. Cerebellar atrophy is noted. Mild periventricular white matter change likely reflects small vessel ischemic microangiopathy.  The brainstem and fourth ventricle are within normal limits. The basal ganglia are unremarkable in appearance. The cerebral hemispheres demonstrate grossly normal gray-white differentiation. No  mass effect or midline shift is seen.  There is no evidence of fracture; visualized osseous structures are unremarkable in appearance. The visualized portions of the orbits are within normal limits. The paranasal sinuses and mastoid air cells are well-aerated. No significant soft tissue abnormalities are seen.  IMPRESSION: 1. No acute intracranial pathology seen on CT. 2. Mild cortical volume loss and scattered small vessel ischemic microangiopathy.   Electronically Signed   By: Garald Balding M.D.   On: 08/21/2014 22:43     EKG Interpretation   Date/Time:  Saturday August 21 2014 21:52:15 EST Ventricular Rate:  56 PR Interval:  149 QRS Duration: 87 QT Interval:  478 QTC Calculation: 461 R Axis:   66 Text Interpretation:  Sinus rhythm Nonspecific T abnrm, anterolateral  leads No significant change since last tracing Confirmed by Vanvranken  MD,  FORREST ([id number]) on 08/21/2014 9:59:31 PM      MDM   Final diagnoses:  Weakness    Patient is a 74 year old African-American female with pertinent past medical history of left carotid stenosis who comes to the emergency department today with left arm heaviness and left facial tingling for approximately an hour. Physical exam as above. Patient has no focal neurologic deficits at this time. This was concerning for TIA. Review of her records demonstrates the last time she had a TIA evaluation was 3 years ago. As a result I feel that she requires reevaluation at this time. Initial workup included a CBC, CMP, troponin, UA, CT of the head, and a chest x-ray. CT of the head demonstrated small vessel infarcts concerning for previous CVAs. As a result I feel the patient requires admission for further evaluation. CBC was unremarkable. BMP was unremarkable. I-STAT troponin was negative. Patient's care was discussed with medicine who agreed the patient be admitted for further evaluation. They recommended a neurology consult to evaluate the patient for a symptomatic  carotid stenosis. Neurology was consulted per their request.  They will evaluate the patient in the emergency department. Patient was admitted to the medicine service in good condition. Labs and imaging reviewed by myself and considered in medical decision making. Imaging was interpreted by radiology. Care was discussed with my attending Dr. Aline Brochure.      Katheren Shams, MD 08/21/14 [id number]  Pamella Pert, MD 08/23/14 2234

## 2014-08-21 NOTE — ED Notes (Addendum)
Pt presents to ED via EMS with c/o weakness and slurred speech she woke up this morning, headache onset at noon. Daughter denies noticing any symptoms. Per EMS, CBG-113, BP-132/70. Pt alerts and oriented x4 at this time with no stroke signs/symptoms, airway intact.

## 2014-08-22 ENCOUNTER — Inpatient Hospital Stay (HOSPITAL_COMMUNITY): Payer: Commercial Managed Care - HMO

## 2014-08-22 DIAGNOSIS — G43909 Migraine, unspecified, not intractable, without status migrainosus: Secondary | ICD-10-CM

## 2014-08-22 DIAGNOSIS — G459 Transient cerebral ischemic attack, unspecified: Secondary | ICD-10-CM

## 2014-08-22 DIAGNOSIS — E785 Hyperlipidemia, unspecified: Secondary | ICD-10-CM

## 2014-08-22 DIAGNOSIS — G43109 Migraine with aura, not intractable, without status migrainosus: Secondary | ICD-10-CM

## 2014-08-22 LAB — LIPID PANEL
Cholesterol: 235 mg/dL — ABNORMAL HIGH (ref 0–200)
HDL: 42 mg/dL (ref >39)
LDL Cholesterol: 176 mg/dL — ABNORMAL HIGH (ref 0–99)
Total CHOL/HDL Ratio: 5.6 RATIO
Triglycerides: 85 mg/dL (ref <150)
VLDL: 17 mg/dL (ref 0–40)

## 2014-08-22 LAB — HEMOGLOBIN A1C
Hgb A1c MFr Bld: 5.9 % — ABNORMAL HIGH (ref <5.7)
Mean Plasma Glucose: 123 mg/dL — ABNORMAL HIGH (ref <117)

## 2014-08-22 MED ORDER — ROSUVASTATIN CALCIUM 40 MG PO TABS
40.0000 mg | ORAL_TABLET | Freq: Every day | ORAL | Status: DC
  Filled 2014-08-22: qty 1

## 2014-08-22 MED ORDER — ROSUVASTATIN CALCIUM 20 MG PO TABS
20.0000 mg | ORAL_TABLET | Freq: Every day | ORAL | Status: DC
  Filled 2014-08-22: qty 1

## 2014-08-22 NOTE — Progress Notes (Signed)
STROKE TEAM PROGRESS NOTE   HISTORY [name] is a 74 y.o. female with multiple medical problems including hyperlipidemia, carotid artery disease, TIA, PVD, depression, brought in via EMS for further evaluation of left arm tingling, pain, and weakness, worsening dysarthria, and left face tingling/pain. She said that she woke up this morning feeling tired and then around 4 pm developed sudden onset of pain-tingling and weakness of the left arm as well as " a swollen-pain-tingly sensation in my left lip that moved up to the cheek area".She reportedly has slurred speech at baseline but noted to have worsening speech. No symptoms involving the left leg. Patient said that she became very concerned, took 2 aspirins, and called her son on the phone and decision was made to call ambulance. [name] stated that the weakness-tingling of the left arm lasted for approximately 3 hours but still experiencing " a tight sensation and tingling " of the left face and her speech is not back to baseline. Denies HA, vertigo, double vision, difficulty swallowing, imbalance, confusion, language or vision impairment. Takes aspirin 81 mg daily. CT brain reviewed by myself revealed no acute abnormality. Of note, her last CUS 4/12 showed 60 to 79% stenosis left ICA and 0-39% stenosis right ICA.  Date last known well: 08/21/14 Time last known well: uncertain tPA Given: no, out of the window   SUBJECTIVE (INTERVAL HISTORY) No family members present. The patient feels she is back to baseline. She has been under a great deal of stress recently as her husband has Alzheimer's dementia and she is the primary caregiver. Dr Roda Shutters told the patient that her symptoms might be stress related. The patient apparently was not taking her Crestor - no need to increase dose at this time.   She has two admissions in the past for stroke-like symptoms. In 10/2009 she has right hand and left leg numbness with headache, MRI negative. In  06/2011 she has right leg and arm pain and numbness with right sided headache. MRI negative. Due to left carotid 40-59% stenosis on doppler, she was considered TIA. Put on ASA and crestor. This time her stroke symptoms on the left and MRI again negative. She also reported frontal headache shortly after the symptoms, therefore, complicated migraine is also in the differentials.   OBJECTIVE Temp:  [98 F (36.7 C)-98.4 F (36.9 C)] 98.2 F (36.8 C) (01/24 1013) Pulse Rate:  [52-70] 70 (01/24 1013) Cardiac Rhythm:  [-] Sinus bradycardia (01/23 2200) Resp:  [12-20] 20 (01/24 1013) BP: (107-151)/(61-88) 107/64 mmHg (01/24 1013) SpO2:  [99 %-100 %] 100 % (01/24 1013) Weight:  [76.476 kg (168 lb 9.6 oz)] 76.476 kg (168 lb 9.6 oz) (01/24 0206)  No results for input(s): GLUCAP in the last 168 hours.  Recent Labs Lab 08/21/14 2153  NA 140  K 4.6  CL 104  CO2 32  GLUCOSE 102*  BUN 10  CREATININE 0.88  CALCIUM 9.7   No results for input(s): AST, ALT, ALKPHOS, BILITOT, PROT, ALBUMIN in the last 168 hours.  Recent Labs Lab 08/21/14 2153  WBC 5.0  NEUTROABS 1.4*  HGB 14.3  HCT 43.2  MCV 94.9  PLT 150   No results for input(s): CKTOTAL, CKMB, CKMBINDEX, TROPONINI in the last 168 hours. No results for input(s): LABPROT, INR in the last 72 hours.  Recent Labs  08/21/14 2251  COLORURINE STRAW*  LABSPEC 1.005  PHURINE 7.5  GLUCOSEU NEGATIVE  HGBUR NEGATIVE  BILIRUBINUR NEGATIVE  KETONESUR NEGATIVE  PROTEINUR NEGATIVE  UROBILINOGEN 0.2  NITRITE NEGATIVE  LEUKOCYTESUR NEGATIVE       Component Value Date/Time   CHOL 235* 08/22/2014 0409   TRIG 85 08/22/2014 0409   HDL 42 08/22/2014 0409   CHOLHDL 5.6 08/22/2014 0409   VLDL 17 08/22/2014 0409   LDLCALC 176* 08/22/2014 0409   Lab Results  Component Value Date   HGBA1C 6.1* 07/31/2011   No results found for: LABOPIA, COCAINSCRNUR, LABBENZ, AMPHETMU, THCU, LABBARB  No results for input(s): ETH in the last 168 hours.  I  have personally reviewed the radiological images below and agree with the radiology interpretations.  Dg Chest 2 View 08/21/2014    No active cardiopulmonary disease.     Ct Head Wo Contrast 08/21/2014    1. No acute intracranial pathology seen on CT.  2. Mild cortical volume loss and scattered small vessel ischemic microangiopathy.      Mr Brain Wo Contrast 08/22/2014     MRI HEAD IMPRESSION:   1. No acute intracranial infarct or other abnormality identified.  2. Generalized cerebral atrophy with chronic microvascular ischemic disease involving the supratentorial white matter, mild for patient age.    MRA HEAD IMPRESSION:   1. No proximal branch occlusion or hemodynamically significant stenosis identified within the intracranial circulation. 2. Mild multi focal atherosclerotic irregularity within the cavernous segments of the internal carotid arteries bilaterally as well as the distal MCA and PCA branches bilaterally.     2-D echocardiogram  08/22/2014 Study Conclusions - Left ventricle: The cavity size was normal. Systolic function was normal. The estimated ejection fraction was in the range of 60% to 65%. Wall motion was normal; there were no regional wall motion abnormalities. - Atrial septum: No defect or patent foramen ovale was identified.  CUS - Bilateral: 1-39% ICA stenosis. Vertebral artery flow is antegrade.   PHYSICAL EXAM  Temp:  [98 F (36.7 C)-98.4 F (36.9 C)] 98.4 F (36.9 C) (01/24 1200) Pulse Rate:  [52-85] 85 (01/24 1200) Resp:  [12-20] 12 (01/24 1200) BP: (107-151)/(60-88) 121/60 mmHg (01/24 1200) SpO2:  [99 %-100 %] 100 % (01/24 1200) Weight:  [168 lb 9.6 oz (76.476 kg)] 168 lb 9.6 oz (76.476 kg) (01/24 0206)  General - Well nourished, well developed, in no apparent distress.  Ophthalmologic - Sharp disc margins OU.  Cardiovascular - Regular rate and rhythm with no murmur.  Mental Status -  Level of arousal and orientation to time, place,  and person were intact. Language including expression, naming, repetition, comprehension was assessed and found intact.  Cranial Nerves II - XII - II - Visual field intact OU. III, IV, VI - Extraocular movements intact. V - Facial sensation intact bilaterally. VII - Facial movement intact bilaterally. VIII - Hearing & vestibular intact bilaterally. X - Palate elevates symmetrically. XI - Chin turning & shoulder shrug intact bilaterally. XII - Tongue protrusion intact.  Motor Strength - The patient's strength was normal in all extremities and pronator drift was absent.  Bulk was normal and fasciculations were absent.   Motor Tone - Muscle tone was assessed at the neck and appendages and was normal.  Reflexes - The patient's reflexes were normal in all extremities and she had no pathological reflexes.  Sensory - Light touch, temperature/pinprick were assessed and were normal.    Coordination - The patient had normal movements in the hands and feet with no ataxia or dysmetria.  Tremor was absent.  Gait and Station - not tested due to fatigue.   ASSESSMENT/PLAN Ms.  Gladys DammeMary L Peck is a 74 y.o. female with history of hyperlipidemia, carotid artery disease, ?TIA, anxiety, and depression presenting with left arm tingling, worsening dysarthria, and left face tingling/pain. The tingling sensation start from lip and then face and then arm, the whole migration process lasted about one hour. She did not receive IV t-PA due to late presentation - Out of therapeutic window.   Complicated migraine vs. Stress related - she was here twice in the past with stroke like symptoms with headache and every time MRI negative and her symptoms were nonspecific for stroke. This time, she has tremendous stress, had tingling sensation with migration over one hour, followed by headache and MRI negative again. Will consider complicated migraine vs. Stress related. However, she dose have some stroke risk factors, will  keep ASA and statin for stroke prevention. CUS this time is unremarkable.  Resultant resolution of deficits  MRI  No acute stroke  MRA  unremarkable  Carotid Doppler - no significant stenosis bilaterally.  2D Echo - no cardiac source of emboli identified. Ejection fraction 60-65%.   LDL 176, not at the goal  [id number] 6.1  Subcutaneous heparin for VTE prophylaxis  Diet Heart with thin liquids  aspirin 81 mg orally every day prior to admission, recommend to continue ASA 81mg  for stroke prevention.  Ongoing aggressive stroke risk factor management  Hyperlipidemia  Home meds:  Crestor 20 mg daily resumed in hospital  LDL 176, goal < 70  Non compliance at home  Patient encouraged to take Crestor as prescribed   Continue statin at discharge  Other Stroke Risk Factors  Advanced age  History of questionable TIAs  Family hx stroke (mother)  Other Pertinent History  Anxiety  depressoin  Stressful family condition  Peripheral neuropathy  Hospital day # 1  Delton Seeavid Rinehuls PA-C [facility] Pager [phone] 08/22/2014, 12:45 PM  I, the attending vascular neurologist, have personally obtained a history, examined the patient, evaluated laboratory data, individually viewed imaging studies and agree with radiology interpretations. I also discussed with Dr. Randol KernElgergawy regarding his care plan. Together with the NP/PA, we formulated the assessment and plan of care which reflects our mutual decision.  I have made any additions or clarifications directly to the above note and agree with the findings and plan as currently documented.   74 yo F with PMH of hyperlipidemia, carotid artery disease, ?TIA, anxiety, and depression presenting with left arm tingling, worsening dysarthria, and left face tingling/pain. She also was admitted twice in the past with stroke like symptoms with headache and MRI both times no stroke. This time, she was under tremendous stress and  stroke like symptoms followed with headache and the tingling migrated from face to arm in one hour. MRI again negative. Her episodes more consistent with complicated migraine vs. Stress related. She does have stroke risk factors such as HLD not compliant with medication. Will continue ASA 81 and crestor 20 for stroke prevention. She was counselled on relaxation and minimize stress at home.   Neurology will sign off. Please call with questions. Thanks for the consult.   Marvel PlanJindong Gazelle Towe, MD PhD Stroke Neurology 08/22/2014 5:40 PM        To contact Stroke Continuity provider, please refer to [email or web address]. After hours, contact General Neurology

## 2014-08-22 NOTE — Progress Notes (Signed)
Utilization Review Completed.   Rhen Dossantos, RN, BSN Nurse Case Manager  

## 2014-08-22 NOTE — Progress Notes (Signed)
DC instructions given to patient. All questions answered. Patient escorted by [facility] to lobby for transport home by family in a private vehicle.

## 2014-08-22 NOTE — Progress Notes (Signed)
Arrived on unit 2335 hrs, A&O, c/o mild numbness/tightening to left side of face no noted droop, states speech is different to her but improving, mild slurring was noted, NIH 1, also some pain/tingling to left hand/thumb. Pt oriented to room and equipment, admission orders being implemented.

## 2014-08-22 NOTE — Progress Notes (Signed)
Echocardiogram 2D Echocardiogram has been performed.  [name] 08/22/2014, 9:57 AM

## 2014-08-22 NOTE — Discharge Instructions (Signed)
Follow with Primary MD [name],[name], MD in 7 days   Get CBC, CMP, 2 view Chest X ray checked  by Primary MD next visit.    Activity: As tolerated with Full fall precautions use walker/cane & assistance as needed   Disposition Home    Diet: Heart Healthy , with feeding assistance and aspiration precautions as needed.  For Heart failure patients - Check your Weight same time everyday, if you gain over 2 pounds, or you develop in leg swelling, experience more shortness of breath or chest pain, call your Primary MD immediately. Follow Cardiac Low Salt Diet and 1.8 lit/day fluid restriction.   On your next visit with your primary care physician please Get Medicines reviewed and adjusted.   Please request your Prim.MD to go over all Hospital Tests and Procedure/Radiological results at the follow up, please get all Hospital records sent to your Prim MD by signing hospital release before you go home.   If you experience worsening of your admission symptoms, develop shortness of breath, life threatening emergency, suicidal or homicidal thoughts you must seek medical attention immediately by calling 911 or calling your MD immediately  if symptoms less severe.  You Must read complete instructions/literature along with all the possible adverse reactions/side effects for all the Medicines you take and that have been prescribed to you. Take any new Medicines after you have completely understood and accpet all the possible adverse reactions/side effects.   Do not drive, operating heavy machinery, perform activities at heights, swimming or participation in water activities or provide baby sitting services if your were admitted for syncope or siezures until you have seen by Primary MD or a Neurologist and advised to do so again.  Do not drive when taking Pain medications.    Do not take more than prescribed Pain, Sleep and Anxiety Medications  Special Instructions: If you have smoked or chewed  Tobacco  in the last 2 yrs please stop smoking, stop any regular Alcohol  and or any Recreational drug use.  Wear Seat belts while driving.   Please note  You were cared for by a hospitalist during your hospital stay. If you have any questions about your discharge medications or the care you received while you were in the hospital after you are discharged, you can call the unit and asked to speak with the hospitalist on call if the hospitalist that took care of you is not available. Once you are discharged, your primary care physician will handle any further medical issues. Please note that NO REFILLS for any discharge medications will be authorized once you are discharged, as it is imperative that you return to your primary care physician (or establish a relationship with a primary care physician if you do not have one) for your aftercare needs so that they can reassess your need for medications and monitor your lab values.

## 2014-08-22 NOTE — Discharge Summary (Signed)
[name], 74 y.o., DOB Nov 20, 1940, MRN [id number]. Admission date: 08/21/2014 Discharge Date 08/22/2014 Primary MD Gwynneth Aliment, MD Admitting Physician Hillary Bow, DO  Admission Diagnosis  TIA (transient ischemic attack) [G45.9] Weakness [R53.1]  Discharge Diagnosis   Principal Problem:   Complicated migraine Active Problems:   Hyperlipemia      Past Medical History  Diagnosis Date  . Impaired glucose tolerance 01/14/2011  . Acute sinus infection 01/15/2011  . ANXIETY 10/03/2009  . BARRETTS ESOPHAGUS 10/03/2009  . CAROTID ARTERY DISEASE 08/31/2009  . COLONIC POLYPS, HX OF 08/29/2009  . CONSTIPATION, CHRONIC 10/03/2009  . DEPRESSION 10/03/2009  . DISC DISEASE, CERVICAL 08/29/2009  . DISC DISEASE, LUMBAR 08/29/2009  . Eustachian tube dysfunction 01/15/2011  . GERD 08/29/2009  . HYPERLIPIDEMIA 08/29/2009  . LEG PAIN, BILATERAL 08/29/2009  . LOW BACK PAIN, CHRONIC 11/22/2009  . PERIPHERAL NEUROPATHY 10/03/2009  . PERIPHERAL VASCULAR DISEASE 08/29/2009  . POSITIVE PPD 10/03/2009  . Preventative health care 01/14/2011  . TIA 11/22/2009  . TRANSIENT ISCHEMIC ATTACK, HX OF 08/29/2009  . URETHRAL STRICTURE 10/03/2009  . VOCAL CORD POLYP, HX OF 10/03/2009  . WRIST PAIN, BILATERAL 05/25/2010  . Thyroid nodule 08/09/2011    Past Surgical History  Procedure Laterality Date  . S/p vocal cord polyps  1984    chronic hoarseness  . Tubal ligation    . Stress test negative  2003,2004,2006  . Hx of right ankle fracture       Hospital Course See H&P, Labs, Consult and Test reports for all details in brief, patient was admitted for **  Principal Problem:   Complicated migraine Active Problems:   Hyperlipemia  Left side tingling, numbness, weakness/ complicated migraine: - Neurology consult appreciated, discussed with Dr. Roda Shutters , her symptoms most likely related to complicated migraine versus stress related, patient had previous presentation in the past with different focal deficits, with negative  MRI and neuro workup. - MRI brain no acute intracranial infarct - MRA head showing no proximal branch occlusion or significant hemodynamically stenosis - Carotid Doppler - 1-39% ICA stenosis bilaterally. Vertebral artery flow is antegrade. - no cardiac source of emboli identified. Ejection fraction 60-65%. - Continue with aspirin 81 mg oral daily.  History of hypertension - Blood pressure is acceptable, patient is not taking any medications at home  Hyperlipidemia - LDL 176 , patient was not taking Crestor at home, she was encouraged to continue with Crestor  Consults  neurology  Significant Tests:  See full reports for all details    Dg Chest 2 View  08/21/2014   CLINICAL DATA:  Weakness  EXAM: CHEST  2 VIEW  COMPARISON:  07/26/2012  FINDINGS: Normal heart size and mediastinal contours. There is chronic mild deviation of the upper thoracic trachea to the right. No acute infiltrate or edema. No effusion or pneumothorax. No acute osseous findings.  IMPRESSION: No active cardiopulmonary disease.   Electronically Signed   By: Tiburcio Pea M.D.   On: 08/21/2014 22:31   Ct Head Wo Contrast  08/21/2014   CLINICAL DATA:  Acute onset of left-sided weakness, left facial numbness and slurred speech. Headache. Initial encounter.  EXAM: CT HEAD WITHOUT CONTRAST  TECHNIQUE: Contiguous axial images were obtained from the base of the skull through the vertex without intravenous contrast.  COMPARISON:  CT of the head performed 07/26/2012  FINDINGS: There is no evidence of acute infarction, mass lesion, or intra- or extra-axial hemorrhage on CT.  Prominence of the sulci suggests mild cortical volume  loss. Cerebellar atrophy is noted. Mild periventricular white matter change likely reflects small vessel ischemic microangiopathy.  The brainstem and fourth ventricle are within normal limits. The basal ganglia are unremarkable in appearance. The cerebral hemispheres demonstrate grossly normal gray-white  differentiation. No mass effect or midline shift is seen.  There is no evidence of fracture; visualized osseous structures are unremarkable in appearance. The visualized portions of the orbits are within normal limits. The paranasal sinuses and mastoid air cells are well-aerated. No significant soft tissue abnormalities are seen.  IMPRESSION: 1. No acute intracranial pathology seen on CT. 2. Mild cortical volume loss and scattered small vessel ischemic microangiopathy.   Electronically Signed   By: Roanna Raider M.D.   On: 08/21/2014 22:43   Mr Brain Wo Contrast  08/22/2014   CLINICAL DATA:  Initial evaluation for left arm tingling, pain, weakness with left facial tingling and pain with worsening dysarthria. Evaluate for stroke. History of hyperlipidemia and carotid artery disease.  EXAM: MRI HEAD WITHOUT CONTRAST  MRA HEAD WITHOUT CONTRAST  TECHNIQUE: Multiplanar, multiecho pulse sequences of the brain and surrounding structures were obtained without intravenous contrast. Angiographic images of the head were obtained using MRA technique without contrast.  COMPARISON:  Prior CT from 08/21/2014 as well as previous MRI from 07/30/2011.  FINDINGS: MRI HEAD FINDINGS  Mild diffuse prominence of the CSF containing spaces is compatible with generalized cerebral atrophy. Scattered patchy T2/FLAIR hyperintensity within the periventricular and deep white matter both cerebral hemispheres noted, most compatible with chronic small vessel ischemic disease, fairly mild for patient age.  No abnormal foci of restricted diffusion to suggest acute intracranial infarct. Gray-white matter differentiation maintained. Normal intravascular flow voids present. There is no intracranial hemorrhage.  No mass lesion or midline shift. Ventricles are normal in size without evidence of hydrocephalus. No extra-axial fluid collection.  Craniocervical junction within normal limits. Pituitary gland normal. No acute abnormality seen about the  orbits.  Paranasal sinuses are clear. No mastoid effusion. Inner ear structures are normal.  Signal intensity within the visualized bone marrow is normal. No abnormality C within the scalp soft tissues.  MRA HEAD FINDINGS  ANTERIOR CIRCULATION:  Visualized distal portions of the internal carotid arteries are widely patent with antegrade flow. The petrous and cavernous segments are widely patent. Mild multi focal atherosclerotic irregularity present within the cavernous carotid arteries bilaterally without significant stenosis. Supra clinoid segments widely patent. A1 segments, anterior communicating artery, and anterior cerebral arteries well opacified bilaterally.  M1 segments widely patent without significant stenosis or proximal branch occlusion. MCA bifurcations within normal limits. Mild distal branch irregularity noted within the MCA branches bilaterally, like related underlying atheromatous disease.  POSTERIOR CIRCULATION:  Vertebral arteries are codominant and widely patent. Posterior inferior cerebral arteries grossly patent without occlusion or other abnormality. Vertebrobasilar junction and basilar artery widely patent. Superior cerebellar arteries and posterior cerebral arteries widely patent bilaterally without proximal branch occlusion or significant stenosis. Mild distal branch irregularity noted within the PCAs bilaterally.  No aneurysm or vascular malformation.  IMPRESSION: MRI HEAD IMPRESSION:  1. No acute intracranial infarct or other abnormality identified. 2. Generalized cerebral atrophy with chronic microvascular ischemic disease involving the supratentorial white matter, mild for patient age.  MRA HEAD IMPRESSION:  1. No proximal branch occlusion or hemodynamically significant stenosis identified within the intracranial circulation. 2. Mild multi focal atherosclerotic irregularity within the cavernous segments of the internal carotid arteries bilaterally as well as the distal MCA and PCA  branches bilaterally.   Electronically  Signed   By: Rise Mu M.D.   On: 08/22/2014 01:50   Mr Maxine Glenn Head/brain Wo Cm  08/22/2014   CLINICAL DATA:  Initial evaluation for left arm tingling, pain, weakness with left facial tingling and pain with worsening dysarthria. Evaluate for stroke. History of hyperlipidemia and carotid artery disease.  EXAM: MRI HEAD WITHOUT CONTRAST  MRA HEAD WITHOUT CONTRAST  TECHNIQUE: Multiplanar, multiecho pulse sequences of the brain and surrounding structures were obtained without intravenous contrast. Angiographic images of the head were obtained using MRA technique without contrast.  COMPARISON:  Prior CT from 08/21/2014 as well as previous MRI from 07/30/2011.  FINDINGS: MRI HEAD FINDINGS  Mild diffuse prominence of the CSF containing spaces is compatible with generalized cerebral atrophy. Scattered patchy T2/FLAIR hyperintensity within the periventricular and deep white matter both cerebral hemispheres noted, most compatible with chronic small vessel ischemic disease, fairly mild for patient age.  No abnormal foci of restricted diffusion to suggest acute intracranial infarct. Gray-white matter differentiation maintained. Normal intravascular flow voids present. There is no intracranial hemorrhage.  No mass lesion or midline shift. Ventricles are normal in size without evidence of hydrocephalus. No extra-axial fluid collection.  Craniocervical junction within normal limits. Pituitary gland normal. No acute abnormality seen about the orbits.  Paranasal sinuses are clear. No mastoid effusion. Inner ear structures are normal.  Signal intensity within the visualized bone marrow is normal. No abnormality C within the scalp soft tissues.  MRA HEAD FINDINGS  ANTERIOR CIRCULATION:  Visualized distal portions of the internal carotid arteries are widely patent with antegrade flow. The petrous and cavernous segments are widely patent. Mild multi focal atherosclerotic irregularity  present within the cavernous carotid arteries bilaterally without significant stenosis. Supra clinoid segments widely patent. A1 segments, anterior communicating artery, and anterior cerebral arteries well opacified bilaterally.  M1 segments widely patent without significant stenosis or proximal branch occlusion. MCA bifurcations within normal limits. Mild distal branch irregularity noted within the MCA branches bilaterally, like related underlying atheromatous disease.  POSTERIOR CIRCULATION:  Vertebral arteries are codominant and widely patent. Posterior inferior cerebral arteries grossly patent without occlusion or other abnormality. Vertebrobasilar junction and basilar artery widely patent. Superior cerebellar arteries and posterior cerebral arteries widely patent bilaterally without proximal branch occlusion or significant stenosis. Mild distal branch irregularity noted within the PCAs bilaterally.  No aneurysm or vascular malformation.  IMPRESSION: MRI HEAD IMPRESSION:  1. No acute intracranial infarct or other abnormality identified. 2. Generalized cerebral atrophy with chronic microvascular ischemic disease involving the supratentorial white matter, mild for patient age.  MRA HEAD IMPRESSION:  1. No proximal branch occlusion or hemodynamically significant stenosis identified within the intracranial circulation. 2. Mild multi focal atherosclerotic irregularity within the cavernous segments of the internal carotid arteries bilaterally as well as the distal MCA and PCA branches bilaterally.   Electronically Signed   By: Rise Mu M.D.   On: 08/22/2014 01:50     Today   Subjective:   Elantra Bianconi today has no headache,no chest abdominal pain,no new weakness tingling or numbness, feels much better today.  Objective:   Blood pressure 121/60, pulse 85, temperature 98.4 F (36.9 C), temperature source Oral, resp. rate 12, height 5\' 6"  (1.676 m), weight 76.476 kg (168 lb 9.6 oz), SpO2 100  %. No intake or output data in the 24 hours ending 08/22/14 1321  Exam Awake Alert, Oriented *3, No new F.N deficits, Normal affect [place].AT,PERRAL Supple Neck,No JVD, No cervical lymphadenopathy appriciated.  Symmetrical Chest wall movement,  Good air movement bilaterally, CTAB RRR,No Gallops,Rubs or new Murmurs, No Parasternal Heave +ve B.Sounds, Abd Soft, Non tender, No organomegaly appriciated, No rebound -guarding or rigidity. No Cyanosis, Clubbing or edema, No new Rash or bruise  Data Review   Cultures -   CBC w Diff:  Lab Results  Component Value Date   WBC 5.0 08/21/2014   WBC 5.1 07/26/2012   HGB 14.3 08/21/2014   HGB 15.4 07/26/2012   HCT 43.2 08/21/2014   HCT 50.4* 07/26/2012   PLT 150 08/21/2014   LYMPHOPCT 56* 08/21/2014   MONOPCT 8 08/21/2014   EOSPCT 7* 08/21/2014   BASOPCT 1 08/21/2014   CMP:  Lab Results  Component Value Date   NA 140 08/21/2014   K 4.6 08/21/2014   CL 104 08/21/2014   CO2 32 08/21/2014   BUN 10 08/21/2014   CREATININE 0.88 08/21/2014   PROT 7.0 07/30/2011   ALBUMIN 4.0 07/30/2011   BILITOT 0.5 07/30/2011   ALKPHOS 76 07/30/2011   AST 24 07/30/2011   ALT 22 07/30/2011  .  Micro Results No results found for this or any previous visit (from the past 240 hour(s)).   Discharge Instructions          Follow-up Information    Follow up with SANDERS,[name], MD. Schedule an appointment as soon as possible for a visit in 1 week.   Specialty:  Internal Medicine   Contact information:   [address] [place] [address] [phone]       Discharge Medications     Medication List    TAKE these medications        ALEVE 220 MG Caps  Generic drug:  Naproxen Sodium  Take 2 capsules by mouth daily as needed (pain).     aspirin 81 MG chewable tablet  Chew 81 mg by mouth daily.     CALCIUM PO  Take 1 tablet by mouth daily.     CRESTOR 20 MG tablet  Generic drug:  rosuvastatin  TAKE ONE TABLET BY MOUTH  EVERY DAY     metroNIDAZOLE 500 MG tablet  Commonly known as:  FLAGYL  Take 500 mg by mouth 2 (two) times daily.     traZODone 50 MG tablet  Commonly known as:  DESYREL  Take 50 mg by mouth at bedtime.     VITAMIN D PO  Take 1 tablet by mouth daily.     VITAMIN E PO  Take 1 tablet by mouth daily.         Total Time in preparing paper work, data evaluation and todays exam - 35 minutes  Moishe Schellenberg M.D on 08/22/2014 at 1:21 PM  [facility] Office  [phone]

## 2014-08-22 NOTE — Progress Notes (Signed)
VASCULAR LAB PRELIMINARY  PRELIMINARY  PRELIMINARY  PRELIMINARY  Carotid duplex completed.    Preliminary report:  Right  - 1% to 39% ICA stenosis. Vertebral artery flow is antegrade. Left - 1-39% ICA stenosis.  Vertebral artery flow is antegrade.     Lisandro Meggett, RVS 08/22/2014, 12:44 PM

## 2014-09-29 DIAGNOSIS — N393 Stress incontinence (female) (male): Secondary | ICD-10-CM | POA: Insufficient documentation

## 2014-09-29 DIAGNOSIS — IMO0002 Reserved for concepts with insufficient information to code with codable children: Secondary | ICD-10-CM | POA: Insufficient documentation

## 2014-10-06 ENCOUNTER — Ambulatory Visit (HOSPITAL_COMMUNITY)
Admission: RE | Admit: 2014-10-06 | Payer: Commercial Managed Care - HMO | Source: Ambulatory Visit | Admitting: Obstetrics and Gynecology

## 2014-10-06 ENCOUNTER — Encounter (HOSPITAL_COMMUNITY): Admission: RE | Payer: Self-pay | Source: Ambulatory Visit

## 2014-10-06 SURGERY — HYSTERECTOMY, VAGINAL
Anesthesia: Choice

## 2014-10-27 ENCOUNTER — Ambulatory Visit (INDEPENDENT_AMBULATORY_CARE_PROVIDER_SITE_OTHER): Payer: Commercial Managed Care - HMO | Admitting: Emergency Medicine

## 2014-10-27 VITALS — BP 100/68 | HR 60 | Temp 98.3°F | Resp 16 | Ht 66.0 in | Wt 159.0 lb

## 2014-10-27 DIAGNOSIS — M5432 Sciatica, left side: Secondary | ICD-10-CM

## 2014-10-27 MED ORDER — CYCLOBENZAPRINE HCL 5 MG PO TABS: 5.0000 mg | ORAL_TABLET | Freq: Three times a day (TID) | ORAL | Status: DC | PRN

## 2014-10-27 MED ORDER — TRAMADOL HCL 50 MG PO TABS: 50.0000 mg | ORAL_TABLET | Freq: Three times a day (TID) | ORAL | Status: DC | PRN

## 2014-10-27 MED ORDER — NAPROXEN 375 MG PO TABS: 375.0000 mg | ORAL_TABLET | Freq: Three times a day (TID) | ORAL | Status: DC

## 2014-10-27 NOTE — Patient Instructions (Signed)
sciatSciatica Sciatica is pain, weakness, numbness, or tingling along the path of the sciatic nerve. The nerve starts in the lower back and runs down the back of each leg. The nerve controls the muscles in the lower leg and in the back of the knee, while also providing sensation to the back of the thigh, lower leg, and the sole of your foot. Sciatica is a symptom of another medical condition. For instance, nerve damage or certain conditions, such as a herniated disk or bone spur on the spine, pinch or put pressure on the sciatic nerve. This causes the pain, weakness, or other sensations normally associated with sciatica. Generally, sciatica only affects one side of the body. CAUSES   Herniated or slipped disc.  Degenerative disk disease.  A pain disorder involving the narrow muscle in the buttocks (piriformis syndrome).  Pelvic injury or fracture.  Pregnancy.  Tumor (rare). SYMPTOMS  Symptoms can vary from mild to very severe. The symptoms usually travel from the low back to the buttocks and down the back of the leg. Symptoms can include:  Mild tingling or dull aches in the lower back, leg, or hip.  Numbness in the back of the calf or sole of the foot.  Burning sensations in the lower back, leg, or hip.  Sharp pains in the lower back, leg, or hip.  Leg weakness.  Severe back pain inhibiting movement. These symptoms may get worse with coughing, sneezing, laughing, or prolonged sitting or standing. Also, being overweight may worsen symptoms. DIAGNOSIS  Your caregiver will perform a physical exam to look for common symptoms of sciatica. He or she may ask you to do certain movements or activities that would trigger sciatic nerve pain. Other tests may be performed to find the cause of the sciatica. These may include:  Blood tests.  X-rays.  Imaging tests, such as an MRI or CT scan. TREATMENT  Treatment is directed at the cause of the sciatic pain. Sometimes, treatment is not  necessary and the pain and discomfort goes away on its own. If treatment is needed, your caregiver may suggest:  Over-the-counter medicines to relieve pain.  Prescription medicines, such as anti-inflammatory medicine, muscle relaxants, or narcotics.  Applying heat or ice to the painful area.  Steroid injections to lessen pain, irritation, and inflammation around the nerve.  Reducing activity during periods of pain.  Exercising and stretching to strengthen your abdomen and improve flexibility of your spine. Your caregiver may suggest losing weight if the extra weight makes the back pain worse.  Physical therapy.  Surgery to eliminate what is pressing or pinching the nerve, such as a bone spur or part of a herniated disk. HOME CARE INSTRUCTIONS   Only take over-the-counter or prescription medicines for pain or discomfort as directed by your caregiver.  Apply ice to the affected area for 20 minutes, 3-4 times a day for the first 48-72 hours. Then try heat in the same way.  Exercise, stretch, or perform your usual activities if these do not aggravate your pain.  Attend physical therapy sessions as directed by your caregiver.  Keep all follow-up appointments as directed by your caregiver.  Do not wear high heels or shoes that do not provide proper support.  Check your mattress to see if it is too soft. A firm mattress may lessen your pain and discomfort. SEEK IMMEDIATE MEDICAL CARE IF:   You lose control of your bowel or bladder (incontinence).  You have increasing weakness in the lower back, pelvis, buttocks,  or legs.  You have redness or swelling of your back.  You have a burning sensation when you urinate.  You have pain that gets worse when you lie down or awakens you at night.  Your pain is worse than you have experienced in the past.  Your pain is lasting longer than 4 weeks.  You are suddenly losing weight without reason. MAKE SURE YOU:  Understand these  instructions.  Will watch your condition.  Will get help right away if you are not doing well or get worse. Document Released: 07/10/2001 Document Revised: 01/15/2012 Document Reviewed: 11/25/2011 Exit[employer] Patient Information 2015 [employer]. This information is not intended to replace advice given to you by your health care provider. Make sure you discuss any questions you have with your health care provider.  

## 2014-10-27 NOTE — Progress Notes (Signed)
[facility] [address], [place] [address] [phone]  Date:  10/27/2014   Name:  [name]   DOB:  1940-10-06   MRN:  [id number]  PCP:  Gwynneth Aliment, MD    Chief Complaint: Leg Pain and Arm Pain   History of Present Illness:  [name] is a 74 y.o. very pleasant female patient who presents with the following:  Patient with history of cervical and lumbar disc disease. Previously treated for left radicular sciatic pain Now has pain in left leg from sciatic notch down left leg in L3 distribution Cannot find documentation of imaging in past No weakness or tingling in leg Similar sharp pain in left arm No history of injury but fertilized yard 2 weeks ago prior to onset. No improvement with over the counter medications or other home remedies.  Pain less with daughter's pain meds.  No improvement with posture or activity No improvement with over the counter medications or other home remedies. Denies other complaint or health concern today.   Patient Active Problem List   Diagnosis Date Noted  . Complicated migraine 08/22/2014  . Multinodular goiter 08/09/2011  . Lumbar pain with radiation down right leg 04/11/2011  . Rash 04/11/2011  . Impaired glucose tolerance 01/14/2011  . Preventative health care 01/14/2011  . WRIST PAIN, BILATERAL 05/25/2010  . TIA 11/22/2009  . LOW BACK PAIN, CHRONIC 11/22/2009  . ANXIETY 10/03/2009  . DEPRESSION 10/03/2009  . PERIPHERAL NEUROPATHY 10/03/2009  . BARRETTS ESOPHAGUS 10/03/2009  . CONSTIPATION, CHRONIC 10/03/2009  . URETHRAL STRICTURE 10/03/2009  . POSITIVE PPD 10/03/2009  . VOCAL CORD POLYP, HX OF 10/03/2009  . CAROTID ARTERY DISEASE 08/31/2009  . Hyperlipemia 08/29/2009  . PERIPHERAL VASCULAR DISEASE 08/29/2009  . GERD 08/29/2009  . DISC DISEASE, CERVICAL 08/29/2009  . DISC DISEASE, LUMBAR 08/29/2009  . LEG PAIN, BILATERAL 08/29/2009  . TRANSIENT ISCHEMIC ATTACK, HX OF 08/29/2009  .  COLONIC POLYPS, HX OF 08/29/2009    Past Medical History  Diagnosis Date  . Impaired glucose tolerance 01/14/2011  . Acute sinus infection 01/15/2011  . ANXIETY 10/03/2009  . BARRETTS ESOPHAGUS 10/03/2009  . CAROTID ARTERY DISEASE 08/31/2009  . COLONIC POLYPS, HX OF 08/29/2009  . CONSTIPATION, CHRONIC 10/03/2009  . DEPRESSION 10/03/2009  . DISC DISEASE, CERVICAL 08/29/2009  . DISC DISEASE, LUMBAR 08/29/2009  . Eustachian tube dysfunction 01/15/2011  . GERD 08/29/2009  . HYPERLIPIDEMIA 08/29/2009  . LEG PAIN, BILATERAL 08/29/2009  . LOW BACK PAIN, CHRONIC 11/22/2009  . PERIPHERAL NEUROPATHY 10/03/2009  . PERIPHERAL VASCULAR DISEASE 08/29/2009  . POSITIVE PPD 10/03/2009  . Preventative health care 01/14/2011  . TIA 11/22/2009  . TRANSIENT ISCHEMIC ATTACK, HX OF 08/29/2009  . URETHRAL STRICTURE 10/03/2009  . VOCAL CORD POLYP, HX OF 10/03/2009  . WRIST PAIN, BILATERAL 05/25/2010  . Thyroid nodule 08/09/2011    Past Surgical History  Procedure Laterality Date  . S/p vocal cord polyps  1984    chronic hoarseness  . Tubal ligation    . Stress test negative  2003,2004,2006  . Hx of right ankle fracture      History  Substance Use Topics  . Smoking status: Never Smoker   . Smokeless tobacco: Never Used  . Alcohol Use: No    Family History  Problem Relation Age of Onset  . Stroke Mother   . Heart disease Mother   . Hypertension Mother   . Diabetes Mother   . Multiple myeloma Sister   .  Cancer Other     lung    Allergies  Allergen Reactions  . Atorvastatin     REACTION: myalgia  . Penicillins Hives    Medication list has been reviewed and updated.  Current Outpatient Prescriptions on File Prior to Visit  Medication Sig Dispense Refill  . aspirin 81 MG chewable tablet Chew 81 mg by mouth daily.    Marland Kitchen CALCIUM PO Take 1 tablet by mouth daily.     . Cholecalciferol (VITAMIN D PO) Take 1 tablet by mouth daily.     . CRESTOR 20 MG tablet TAKE ONE TABLET BY MOUTH EVERY DAY 90 each 3  .  traZODone (DESYREL) 50 MG tablet Take 50 mg by mouth at bedtime.    Marland Kitchen VITAMIN E PO Take 1 tablet by mouth daily.     Marland Kitchen conjugated estrogens (PREMARIN) vaginal cream Place 1 Applicatorful vaginally 2 (two) times a week.    . Naproxen Sodium (ALEVE) 220 MG CAPS Take 2 capsules by mouth daily as needed (pain).     . [DISCONTINUED] gabapentin (NEURONTIN) 600 MG tablet Take 1 tablet (600 mg total) by mouth 3 (three) times daily as needed. 90 tablet 5   No current facility-administered medications on file prior to visit.    Review of Systems:  As per HPI, otherwise negative.    Physical Examination: Filed Vitals:   10/27/14 1542  BP: 100/68  Pulse: 60  Temp: 98.3 F (36.8 C)  Resp: 16   Filed Vitals:   10/27/14 1542  Height: 5\' 6"  (1.676 m)  Weight: 159 lb (72.122 kg)   Body mass index is 25.68 kg/(m^2). Ideal Body Weight: Weight in (lb) to have BMI = 25: 154.6   GEN: WDWN, NAD, Non-toxic, Alert & Oriented x 3 HEENT: Atraumatic, Normocephalic.  Ears and Nose: No external deformity. EXTR: No clubbing/cyanosis/edema NEURO: antalgic gait favoring left leg.  PSYCH: Normally interactive. Conversant. Not depressed or anxious appearing.  Calm demeanor.  tender left sciatic notch and hyporeflexic left patella  Assessment and Plan: Sciatic neuritis Flexeril naproxyn Ultram  Signed,  Phillips Odor, MD

## 2014-11-03 DIAGNOSIS — N819 Female genital prolapse, unspecified: Secondary | ICD-10-CM | POA: Insufficient documentation

## 2014-11-19 ENCOUNTER — Ambulatory Visit
Admission: RE | Admit: 2014-11-19 | Discharge: 2014-11-19 | Disposition: A | Discharge status: Home / Self Care | Payer: Commercial Managed Care - HMO | Source: Ambulatory Visit | Attending: Internal Medicine | Admitting: Internal Medicine

## 2014-11-19 ENCOUNTER — Other Ambulatory Visit: Payer: Self-pay | Admitting: Internal Medicine

## 2014-11-19 ENCOUNTER — Other Ambulatory Visit: Payer: Self-pay | Admitting: Nurse Practitioner

## 2014-11-19 DIAGNOSIS — M25562 Pain in left knee: Secondary | ICD-10-CM

## 2015-01-13 ENCOUNTER — Encounter (HOSPITAL_COMMUNITY): Payer: Self-pay | Admitting: Emergency Medicine

## 2015-01-13 ENCOUNTER — Emergency Department (HOSPITAL_COMMUNITY)
Admission: EM | Admit: 2015-01-13 | Discharge: 2015-01-13 | Disposition: A | Discharge status: Home / Self Care | Payer: Commercial Managed Care - HMO | Attending: Emergency Medicine | Admitting: Emergency Medicine

## 2015-01-13 ENCOUNTER — Emergency Department (HOSPITAL_COMMUNITY): Payer: Commercial Managed Care - HMO

## 2015-01-13 DIAGNOSIS — I2510 Atherosclerotic heart disease of native coronary artery without angina pectoris: Secondary | ICD-10-CM | POA: Insufficient documentation

## 2015-01-13 DIAGNOSIS — G8929 Other chronic pain: Secondary | ICD-10-CM | POA: Insufficient documentation

## 2015-01-13 DIAGNOSIS — Z8673 Personal history of transient ischemic attack (TIA), and cerebral infarction without residual deficits: Secondary | ICD-10-CM | POA: Diagnosis not present

## 2015-01-13 DIAGNOSIS — R531 Weakness: Secondary | ICD-10-CM | POA: Diagnosis present

## 2015-01-13 DIAGNOSIS — Z7982 Long term (current) use of aspirin: Secondary | ICD-10-CM | POA: Insufficient documentation

## 2015-01-13 DIAGNOSIS — Z791 Long term (current) use of non-steroidal anti-inflammatories (NSAID): Secondary | ICD-10-CM | POA: Diagnosis not present

## 2015-01-13 DIAGNOSIS — Z880 Allergy status to penicillin: Secondary | ICD-10-CM | POA: Diagnosis not present

## 2015-01-13 DIAGNOSIS — F419 Anxiety disorder, unspecified: Secondary | ICD-10-CM | POA: Diagnosis not present

## 2015-01-13 DIAGNOSIS — Z8639 Personal history of other endocrine, nutritional and metabolic disease: Secondary | ICD-10-CM | POA: Insufficient documentation

## 2015-01-13 DIAGNOSIS — Z8719 Personal history of other diseases of the digestive system: Secondary | ICD-10-CM | POA: Diagnosis not present

## 2015-01-13 DIAGNOSIS — R079 Chest pain, unspecified: Secondary | ICD-10-CM | POA: Diagnosis not present

## 2015-01-13 DIAGNOSIS — E785 Hyperlipidemia, unspecified: Secondary | ICD-10-CM | POA: Insufficient documentation

## 2015-01-13 DIAGNOSIS — Z79899 Other long term (current) drug therapy: Secondary | ICD-10-CM | POA: Insufficient documentation

## 2015-01-13 DIAGNOSIS — Z86010 Personal history of colonic polyps: Secondary | ICD-10-CM | POA: Diagnosis not present

## 2015-01-13 DIAGNOSIS — F329 Major depressive disorder, single episode, unspecified: Secondary | ICD-10-CM | POA: Insufficient documentation

## 2015-01-13 LAB — BASIC METABOLIC PANEL
Anion gap: 9 (ref 5–15)
BUN: 13 mg/dL (ref 6–20)
CO2: 30 mmol/L (ref 22–32)
CREATININE: 1.03 mg/dL — AB (ref 0.44–1.00)
Calcium: 9.5 mg/dL (ref 8.9–10.3)
Chloride: 101 mmol/L (ref 101–111)
GFR calc Af Amer: >60 mL/min (ref >60)
GFR, EST NON AFRICAN AMERICAN: 52 mL/min — AB (ref >60)
GLUCOSE: 98 mg/dL (ref 65–99)
Potassium: 4.2 mmol/L (ref 3.5–5.1)
SODIUM: 140 mmol/L (ref 135–145)

## 2015-01-13 LAB — CBC
HCT: 46.8 % — ABNORMAL HIGH (ref 36.0–46.0)
Hemoglobin: 14.9 g/dL (ref 12.0–15.0)
MCH: 31.1 pg (ref 26.0–34.0)
MCHC: 31.8 g/dL (ref 30.0–36.0)
MCV: 97.7 fL (ref 78.0–100.0)
Platelets: 174 10*3/uL (ref 150–400)
RBC: 4.79 MIL/uL (ref 3.87–5.11)
RDW: 15.3 % (ref 11.5–15.5)
WBC: 3.9 10*3/uL — ABNORMAL LOW (ref 4.0–10.5)

## 2015-01-13 LAB — D-DIMER, QUANTITATIVE (NOT AT ARMC): D DIMER QUANT: 0.4 ug{FEU}/mL (ref 0.00–0.48)

## 2015-01-13 LAB — TROPONIN I: Troponin I: <0.03 ng/mL (ref <0.031)

## 2015-01-13 MED ORDER — SODIUM CHLORIDE 0.9 % IV BOLUS (SEPSIS)
500.0000 mL | Freq: Once | INTRAVENOUS | Status: AC
  Administered 2015-01-13: 500 mL via INTRAVENOUS

## 2015-01-13 NOTE — ED Notes (Signed)
Nurse drawing labs. 

## 2015-01-13 NOTE — Discharge Instructions (Signed)
The cause of your symptoms was not identified today.  Please get rechecked immediately if you develop new or worrisome symptoms.    Chest Pain (Nonspecific) It is often hard to give a specific diagnosis for the cause of chest pain. There is always a chance that your pain could be related to something serious, such as a heart attack or a blood clot in the lungs. You need to follow up with your health care provider for further evaluation. CAUSES   Heartburn.  Pneumonia or bronchitis.  Anxiety or stress.  Inflammation around your heart (pericarditis) or lung (pleuritis or pleurisy).  A blood clot in the lung.  A collapsed lung (pneumothorax). It can develop suddenly on its own (spontaneous pneumothorax) or from trauma to the chest.  Shingles infection (herpes zoster virus). The chest wall is composed of bones, muscles, and cartilage. Any of these can be the source of the pain.  The bones can be bruised by injury.  The muscles or cartilage can be strained by coughing or overwork.  The cartilage can be affected by inflammation and become sore (costochondritis). DIAGNOSIS  Lab tests or other studies may be needed to find the cause of your pain. Your health care provider may have you take a test called an ambulatory electrocardiogram (ECG). An ECG records your heartbeat patterns over a 24-hour period. You may also have other tests, such as:  Transthoracic echocardiogram (TTE). During echocardiography, sound waves are used to evaluate how blood flows through your heart.  Transesophageal echocardiogram (TEE).  Cardiac monitoring. This allows your health care provider to monitor your heart rate and rhythm in real time.  Holter monitor. This is a portable device that records your heartbeat and can help diagnose heart arrhythmias. It allows your health care provider to track your heart activity for several days, if needed.  Stress tests by exercise or by giving medicine that makes the heart  beat faster. TREATMENT   Treatment depends on what may be causing your chest pain. Treatment may include:  Acid blockers for heartburn.  Anti-inflammatory medicine.  Pain medicine for inflammatory conditions.  Antibiotics if an infection is present.  You may be advised to change lifestyle habits. This includes stopping smoking and avoiding alcohol, caffeine, and chocolate.  You may be advised to keep your head raised (elevated) when sleeping. This reduces the chance of acid going backward from your stomach into your esophagus. Most of the time, nonspecific chest pain will improve within 2-3 days with rest and mild pain medicine.  HOME CARE INSTRUCTIONS   If antibiotics were prescribed, take them as directed. Finish them even if you start to feel better.  For the next few days, avoid physical activities that bring on chest pain. Continue physical activities as directed.  Do not use any tobacco products, including cigarettes, chewing tobacco, or electronic cigarettes.  Avoid drinking alcohol.  Only take medicine as directed by your health care provider.  Follow your health care provider's suggestions for further testing if your chest pain does not go away.  Keep any follow-up appointments you made. If you do not go to an appointment, you could develop lasting (chronic) problems with pain. If there is any problem keeping an appointment, call to reschedule. SEEK MEDICAL CARE IF:   Your chest pain does not go away, even after treatment.  You have a rash with blisters on your chest.  You have a fever. SEEK IMMEDIATE MEDICAL CARE IF:   You have increased chest pain or pain that  spreads to your arm, neck, jaw, back, or abdomen.  You have shortness of breath.  You have an increasing cough, or you cough up blood.  You have severe back or abdominal pain.  You feel nauseous or vomit.  You have severe weakness.  You faint.  You have chills. This is an emergency. Do not wait  to see if the pain will go away. Get medical help at once. Call your local emergency services (911 in [place] Do not drive yourself to the hospital. MAKE SURE YOU:   Understand these instructions.  Will watch your condition.  Will get help right away if you are not doing well or get worse. Document Released: 04/25/2005 Document Revised: 07/21/2013 Document Reviewed: 02/19/2008 [facility] Patient Information 2015 [place], [place]. This information is not intended to replace advice given to you by your health care provider. Make sure you discuss any questions you have with your health care provider.  Weakness Weakness is a lack of strength. It may be felt all over the body (generalized) or in one specific part of the body (focal). Some causes of weakness can be serious. You may need further medical evaluation, especially if you are elderly or you have a history of immunosuppression (such as chemotherapy or HIV), kidney disease, heart disease, or diabetes. CAUSES  Weakness can be caused by many different things, including:  Infection.  Physical exhaustion.  Internal bleeding or other blood loss that results in a lack of red blood cells (anemia).  Dehydration. This cause is more common in elderly people.  Side effects or electrolyte abnormalities from medicines, such as pain medicines or sedatives.  Emotional distress, anxiety, or depression.  Circulation problems, especially severe peripheral arterial disease.  Heart disease, such as rapid atrial fibrillation, bradycardia, or heart failure.  Nervous system disorders, such as Guillain-Barr syndrome, multiple sclerosis, or stroke. DIAGNOSIS  To find the cause of your weakness, your caregiver will take your history and perform a physical exam. Lab tests or X-rays may also be ordered, if needed. TREATMENT  Treatment of weakness depends on the cause of your symptoms and can vary greatly. HOME CARE INSTRUCTIONS   Rest as needed.  Eat a  well-balanced diet.  Try to get some exercise every day.  Only take over-the-counter or prescription medicines as directed by your caregiver. SEEK MEDICAL CARE IF:   Your weakness seems to be getting worse or spreads to other parts of your body.  You develop new aches or pains. SEEK IMMEDIATE MEDICAL CARE IF:   You cannot perform your normal daily activities, such as getting dressed and feeding yourself.  You cannot walk up and down stairs, or you feel exhausted when you do so.  You have shortness of breath or chest pain.  You have difficulty moving parts of your body.  You have weakness in only one area of the body or on only one side of the body.  You have a fever.  You have trouble speaking or swallowing.  You cannot control your bladder or bowel movements.  You have black or bloody vomit or stools. MAKE SURE YOU:  Understand these instructions.  Will watch your condition.  Will get help right away if you are not doing well or get worse. Document Released: 07/16/2005 Document Revised: 01/15/2012 Document Reviewed: 09/14/2011 [facility] Patient Information 2015 [place], [place]. This information is not intended to replace advice given to you by your health care provider. Make sure you discuss any questions you have with your health care provider.

## 2015-01-13 NOTE — ED Notes (Signed)
Pt states that for the past 2 days, she has had weakness and fatigue.  States that she has been having intermittent lt sided CP but denies now.  States that she was having trouble with strength to walk this morning and checked her BP.  States initial BP was 90 systolic and she checked it again "a little while later" and it was 72 systolic.  Pt's doc advised her to come to ED.  Normotensive now.

## 2015-01-13 NOTE — ED Provider Notes (Signed)
CSN: [id number]     Arrival date & time 01/13/15  1143 History   First MD Initiated Contact with Patient 01/13/15 1247     Chief Complaint  Patient presents with  . Fatigue  . Weakness  . Hypotension     Patient is a 74 y.o. female presenting with weakness. The history is provided by the patient. No language interpreter was used.  Weakness   [name] presents for evaluation of fatigue, chest pain, generalized weakness. She reports 2-3 days of increased fatigue and generalized weakness. She's been having left-sided chest pain intermittently. The pain is described as sharp in nature and episodes last about a seconds. There are no clear alleviating or worsening factors. The pain seems to occur at rest. She has no fevers, shortness of breath, abdominal pain, vomiting, diarrhea. Today she was feeling so weak she had difficulty with ambulation in the house. She checked her blood pressure and it was initially 90 systolic and then when she checked it later was in the 70s. Her blood pressures typically low. She did have pelvic surgery at the end of April. She does not take any estrogens. She has no history of DVT or PE. Symptoms are moderate, waxing and waning, worsening.  Past Medical History  Diagnosis Date  . Impaired glucose tolerance 01/14/2011  . Acute sinus infection 01/15/2011  . ANXIETY 10/03/2009  . BARRETTS ESOPHAGUS 10/03/2009  . CAROTID ARTERY DISEASE 08/31/2009  . COLONIC POLYPS, HX OF 08/29/2009  . CONSTIPATION, CHRONIC 10/03/2009  . DEPRESSION 10/03/2009  . [place] DISEASE, CERVICAL 08/29/2009  . [place] DISEASE, LUMBAR 08/29/2009  . Eustachian tube dysfunction 01/15/2011  . GERD 08/29/2009  . HYPERLIPIDEMIA 08/29/2009  . LEG PAIN, BILATERAL 08/29/2009  . LOW BACK PAIN, CHRONIC 11/22/2009  . PERIPHERAL NEUROPATHY 10/03/2009  . PERIPHERAL VASCULAR DISEASE 08/29/2009  . POSITIVE PPD 10/03/2009  . Preventative health care 01/14/2011  . TIA 11/22/2009  . TRANSIENT ISCHEMIC ATTACK, HX OF 08/29/2009  .  URETHRAL STRICTURE 10/03/2009  . VOCAL CORD POLYP, HX OF 10/03/2009  . WRIST PAIN, BILATERAL 05/25/2010  . Thyroid nodule 08/09/2011   Past Surgical History  Procedure Laterality Date  . S/p vocal cord polyps  1984    chronic hoarseness  . Tubal ligation    . Stress test negative  2003,2004,2006  . Hx of right ankle fracture    . Uterine prolapse     Family History  Problem Relation Age of Onset  . Stroke Mother   . Heart disease Mother   . Hypertension Mother   . Diabetes Mother   . Multiple myeloma Sister   . Cancer Other     lung   History  Substance Use Topics  . Smoking status: Never Smoker   . Smokeless tobacco: Never Used  . Alcohol Use: No   OB History    No data available     Review of Systems  Neurological: Positive for weakness.  All other systems reviewed and are negative.      Allergies  Atorvastatin and Penicillins  Home Medications   Prior to Admission medications   Medication Sig Start Date End Date Taking? Authorizing Provider  aspirin 81 MG chewable tablet Chew 81 mg by mouth daily.   Yes Historical Provider, MD  CALCIUM PO Take 1 tablet by mouth daily.    Yes Historical Provider, MD  CRESTOR 20 MG tablet TAKE ONE TABLET BY MOUTH EVERY DAY 11/14/10  Yes Biagio Borg, MD  cyclobenzaprine (FLEXERIL) 5 MG tablet  Take 1 tablet (5 mg total) by mouth 3 (three) times daily as needed for muscle spasms. 10/27/14  Yes Roselee Culver, MD  Multiple Vitamins-Minerals (MULTIVITAMIN WITH MINERALS) tablet Take 1 tablet by mouth daily.   Yes Historical Provider, MD  Naproxen Sodium (ALEVE) 220 MG CAPS Take 2 capsules by mouth daily as needed (pain).    Yes Historical Provider, MD  traMADol (ULTRAM) 50 MG tablet Take 1 tablet (50 mg total) by mouth every 8 (eight) hours as needed. Patient taking differently: Take 50 mg by mouth every 8 (eight) hours as needed for severe pain.  10/27/14  Yes Roselee Culver, MD  traZODone (DESYREL) 50 MG tablet Take 12.5 mg by  mouth daily.    Yes Historical Provider, MD  naproxen (NAPROSYN) 375 MG tablet Take 1 tablet (375 mg total) by mouth 3 (three) times daily with meals. Patient not taking: Reported on 01/13/2015 10/27/14   Roselee Culver, MD   BP 128/59 mmHg  Pulse 64  Temp(Src) 97.9 F (36.6 C) (Oral)  Resp 18  SpO2 99% Physical Exam  Constitutional: She is oriented to person, place, and time. She appears well-developed and well-nourished.  HENT:  Head: Normocephalic and atraumatic.  Cardiovascular: Normal rate and regular rhythm.   No murmur heard. Pulmonary/Chest: Effort normal and breath sounds normal. No respiratory distress. She exhibits no tenderness.  Abdominal: Soft. There is no tenderness. There is no rebound and no guarding.  Musculoskeletal: She exhibits no edema or tenderness.  Neurological: She is alert and oriented to person, place, and time.  Skin: Skin is warm and dry.  Psychiatric: She has a normal mood and affect. Her behavior is normal.  Nursing note and vitals reviewed.   ED Course  Procedures (including critical care time) Labs Review Labs Reviewed  CBC - Abnormal; Notable for the following:    WBC 3.9 (*)    HCT 46.8 (*)    All other components within normal limits  BASIC METABOLIC PANEL - Abnormal; Notable for the following:    Creatinine, Ser 1.03 (*)    GFR calc non Af Amer 52 (*)    All other components within normal limits  D-DIMER, QUANTITATIVE (NOT AT [facility])  TROPONIN I    Imaging Review Dg Chest 2 View  01/13/2015   CLINICAL DATA:  Weakness and fatigue for 2 days, left-sided chest pain  EXAM: CHEST - 2 VIEW  COMPARISON:  08/21/2014  FINDINGS: The heart size and mediastinal contours are within normal limits. Both lungs are clear. The visualized skeletal structures show degenerative change of the thoracic spine.  IMPRESSION: No active disease.   Electronically Signed   By: Inez Catalina M.D.   On: 01/13/2015 15:08     EKG Interpretation   Date/Time:   Thursday January 13 2015 11:52:17 EDT Ventricular Rate:  66 PR Interval:  175 QRS Duration: 84 QT Interval:  424 QTC Calculation: 444 R Axis:   45 Text Interpretation:  Sinus rhythm Borderline T abnormalities, anterior  leads Confirmed by Hazle Coca [phone]) on 01/13/2015 12:47:51 PM      MDM   Final diagnoses:  Asthenia  Chest pain, unspecified chest pain type    Patient here for evaluation of generalized weakness, fatigue, low blood pressure at home, intermittent atypical chest pain. Patient is nontoxic appearing on examination and feels improved compared to at home earlier. There is no evidence of acute infectious process. Patient is slightly risk for PE and d-dimer is negative. EKG is unchanged  from prior. Chest pain is very atypical for cardiac etiology. Discussed with Dr. Einar Gip, patient's cardiologist, plan to follow-up in the office for recheck. Clinical picture is not c/w ACS.  Discussed home care and close preterm cautions.    Quintella Reichert, MD 01/14/15 [phone]

## 2015-02-08 ENCOUNTER — Other Ambulatory Visit: Payer: Self-pay

## 2015-02-08 DIAGNOSIS — Z1231 Encounter for screening mammogram for malignant neoplasm of breast: Secondary | ICD-10-CM

## 2015-02-15 ENCOUNTER — Other Ambulatory Visit: Payer: Self-pay | Admitting: Internal Medicine

## 2015-02-15 ENCOUNTER — Other Ambulatory Visit: Payer: Self-pay

## 2015-02-15 DIAGNOSIS — N6315 Unspecified lump in the right breast, overlapping quadrants: Secondary | ICD-10-CM

## 2015-02-15 DIAGNOSIS — N6310 Unspecified lump in the right breast, unspecified quadrant: Principal | ICD-10-CM

## 2015-02-18 ENCOUNTER — Other Ambulatory Visit: Payer: Commercial Managed Care - HMO

## 2015-02-22 ENCOUNTER — Ambulatory Visit
Admission: RE | Admit: 2015-02-22 | Discharge: 2015-02-22 | Disposition: A | Discharge status: Home / Self Care | Payer: Commercial Managed Care - HMO | Source: Ambulatory Visit | Attending: Internal Medicine | Admitting: Internal Medicine

## 2015-02-22 ENCOUNTER — Other Ambulatory Visit: Payer: Self-pay | Admitting: Internal Medicine

## 2015-02-22 DIAGNOSIS — N6310 Unspecified lump in the right breast, unspecified quadrant: Principal | ICD-10-CM

## 2015-02-22 DIAGNOSIS — N6315 Unspecified lump in the right breast, overlapping quadrants: Secondary | ICD-10-CM

## 2015-03-12 ENCOUNTER — Other Ambulatory Visit: Payer: Self-pay | Admitting: Emergency Medicine

## 2015-03-14 NOTE — Telephone Encounter (Signed)
Dr Dareen Piano, do you want to Rf or RTC for re-eval?

## 2015-11-23 ENCOUNTER — Ambulatory Visit (INDEPENDENT_AMBULATORY_CARE_PROVIDER_SITE_OTHER): Payer: Commercial Managed Care - HMO | Admitting: Physician Assistant

## 2015-11-23 VITALS — BP 112/70 | HR 60 | Temp 98.1°F | Resp 16 | Ht 66.0 in | Wt 159.8 lb

## 2015-11-23 DIAGNOSIS — L84 Corns and callosities: Secondary | ICD-10-CM

## 2015-11-23 DIAGNOSIS — K130 Diseases of lips: Secondary | ICD-10-CM

## 2015-11-23 MED ORDER — TRIAMCINOLONE ACETONIDE 0.1 % MT PSTE: PASTE | OROMUCOSAL | Status: DC

## 2015-11-23 MED ORDER — SALICYLIC ACID 17 % EX GEL: Freq: Every day | CUTANEOUS | Status: DC

## 2015-11-23 NOTE — Progress Notes (Signed)
[facility] [address] [place] [address] [phone]  Date:  11/23/2015   Name:  [name]   DOB:  August 19, 1940   MRN:  [id number]  PCP:  Sonya Aliment, MD   History of Present Illness:  [name] is a 75 y.o. female patient who presents to [facility] for chief complaint of right toe pain and lip lesion. Toe pain: Patient complains of right toe pain for a few weeks now. She states whenever she puts on shoes she feels at peace piercing pain at her fifth toe that radiates up into the foot. There is no redness or swelling that she can note. She has never had an injury to that toe, however she has had many sprains of her right ankle. She has not taken anything for treatment. She is wearing comfortable foot wear with plenty of cushioning, she reports. Lip lesion: Patient has noticed a lesion near the corner of her right upper lip that is been present for a week. She noticed it when she tasted blood and noticed that her lip was bleeding. It is a bump that is nontender. It does not itch. It is just very much present. She tried using this so the thorax on it she has never had a cold sore along her lips. She has used her Zovirax for herpetic lesions in her genital area. Last time was about 2 weeks ago however she does not even use her hands and washes them directly after she is done applying. She has not had any recent upper respiratory infection. She does note that she was working with chemicals in her garden. She has not had any recent fevers, no new medicines, or any abnormal or highly acidic/spicy foods.     Patient Active Problem List   Diagnosis Date Noted  . Complicated migraine 08/22/2014  . Multinodular goiter 08/09/2011  . Lumbar pain with radiation down right leg 04/11/2011  . Rash 04/11/2011  . Impaired glucose tolerance 01/14/2011  . Preventative health care 01/14/2011  . WRIST PAIN, BILATERAL 05/25/2010  . TIA 11/22/2009  . LOW BACK PAIN, CHRONIC  11/22/2009  . ANXIETY 10/03/2009  . DEPRESSION 10/03/2009  . PERIPHERAL NEUROPATHY 10/03/2009  . BARRETTS ESOPHAGUS 10/03/2009  . CONSTIPATION, CHRONIC 10/03/2009  . URETHRAL STRICTURE 10/03/2009  . POSITIVE PPD 10/03/2009  . VOCAL CORD POLYP, HX OF 10/03/2009  . CAROTID ARTERY DISEASE 08/31/2009  . Hyperlipemia 08/29/2009  . PERIPHERAL VASCULAR DISEASE 08/29/2009  . GERD 08/29/2009  . DISC DISEASE, CERVICAL 08/29/2009  . DISC DISEASE, LUMBAR 08/29/2009  . LEG PAIN, BILATERAL 08/29/2009  . TRANSIENT ISCHEMIC ATTACK, HX OF 08/29/2009  . COLONIC POLYPS, HX OF 08/29/2009    Past Medical History  Diagnosis Date  . Impaired glucose tolerance 01/14/2011  . Acute sinus infection 01/15/2011  . ANXIETY 10/03/2009  . BARRETTS ESOPHAGUS 10/03/2009  . CAROTID ARTERY DISEASE 08/31/2009  . COLONIC POLYPS, HX OF 08/29/2009  . CONSTIPATION, CHRONIC 10/03/2009  . DEPRESSION 10/03/2009  . DISC DISEASE, CERVICAL 08/29/2009  . DISC DISEASE, LUMBAR 08/29/2009  . Eustachian tube dysfunction 01/15/2011  . GERD 08/29/2009  . HYPERLIPIDEMIA 08/29/2009  . LEG PAIN, BILATERAL 08/29/2009  . LOW BACK PAIN, CHRONIC 11/22/2009  . PERIPHERAL NEUROPATHY 10/03/2009  . PERIPHERAL VASCULAR DISEASE 08/29/2009  . POSITIVE PPD 10/03/2009  . Preventative health care 01/14/2011  . TIA 11/22/2009  . TRANSIENT ISCHEMIC ATTACK, HX OF 08/29/2009  . URETHRAL STRICTURE 10/03/2009  . VOCAL CORD POLYP, HX OF 10/03/2009  .  WRIST PAIN, BILATERAL 05/25/2010  . Thyroid nodule 08/09/2011    Past Surgical History  Procedure Laterality Date  . S/p vocal cord polyps  1984    chronic hoarseness  . Tubal ligation    . Stress test negative  2003,2004,2006  . Hx of right ankle fracture    . Uterine prolapse      Social History  Substance Use Topics  . Smoking status: Never Smoker   . Smokeless tobacco: Never Used  . Alcohol Use: No    Family History  Problem Relation Age of Onset  . Stroke Mother   . Heart disease Mother   . Hypertension  Mother   . Diabetes Mother   . Multiple myeloma Sister   . Cancer Other     lung    Allergies  Allergen Reactions  . Atorvastatin     REACTION: myalgia  . Penicillins Hives    Medication list has been reviewed and updated.  Current Outpatient Prescriptions on File Prior to Visit  Medication Sig Dispense Refill  . aspirin 81 MG chewable tablet Chew 81 mg by mouth daily.    [place] CALCIUM PO Take 1 tablet by mouth daily.     . CRESTOR 20 MG tablet TAKE ONE TABLET BY MOUTH EVERY DAY 90 each 3  . Multiple Vitamins-Minerals (MULTIVITAMIN WITH MINERALS) tablet Take 1 tablet by mouth daily.    . naproxen (NAPROSYN) 375 MG tablet TAKE ONE TABLET BY MOUTH THREE TIMES DAILY WITH MEALS 45 tablet 0  . Naproxen Sodium (ALEVE) 220 MG CAPS Take 2 capsules by mouth daily as needed (pain).     . traMADol (ULTRAM) 50 MG tablet Take 1 tablet (50 mg total) by mouth every 8 (eight) hours as needed. (Patient taking differently: Take 50 mg by mouth every 8 (eight) hours as needed for severe pain. ) 45 tablet 0  . traZODone (DESYREL) 50 MG tablet Take 12.5 mg by mouth daily.     . [DISCONTINUED] gabapentin (NEURONTIN) 600 MG tablet Take 1 tablet (600 mg total) by mouth 3 (three) times daily as needed. 90 tablet 5   No current facility-administered medications on file prior to visit.    ROS ROS otherwise normal unless listed above.  Physical Examination: BP 112/70 mmHg  Pulse 60  Temp(Src) 98.1 F (36.7 C) (Oral)  Resp 16  Ht 5\' 6"  (1.676 m)  Wt 159 lb 12.8 oz (72.485 kg)  BMI 25.80 kg/m2  SpO2 99% Ideal Body Weight: Weight in (lb) to have BMI = 25: 154.6  Physical Exam  Constitutional: She is oriented to person, place, and time. She appears well-developed and well-nourished. No distress.  HENT:  Head: Normocephalic and atraumatic.  Right Ear: External ear normal.  Left Ear: External ear normal.  Eyes: Conjunctivae and EOM are normal. Pupils are equal, round, and reactive to light.   Cardiovascular: Normal rate.   Pulmonary/Chest: Effort normal. No respiratory distress.  Neurological: She is alert and oriented to person, place, and time.  Skin: Skin is warm and dry. She is not diaphoretic.  Left side of upper lip with a slightly enlarged papular lesion that is nontender. There is no vesicle or erythema surrounding. There is no umbilicated center.  Right fifth toe with highly thickened keratotic buildup along the lateral side of the fifth toe consistent with a corn. Her history of pain is reproduced with significant palpation to that fifth toe. No tenderness with full tarsal squeezing/or evidence of a possible neuroma. Normal range of  motion in all planes.    Psychiatric: She has a normal mood and affect. Her behavior is normal.     Assessment and Plan: YOANA LARACUENTE is a 75 y.o. female who is here today with chief complaint of lesion on upper left lip, and toe I have advised that she use this topical steroid/oral-based cream along this lesion for 1 week. If this continues to rebleed we will send her to dermatology. I have also advised her to contact me sooner if it becomes enlarged. This has brought that up in one week, and fairly low risk for malignant lesion however its rebleeding is problematic. We will not pare this now however I have advised her to use salicylic acid on the toe area 1-2 times daily she'll then be as a corn pad. No longer than 14 days if it continues to be a problem she will contact us.  1. Lip lesion - triamcinolone (KENALOG) 0.1 % paste; 1 application to lip twice per day  Dispense: 5 g; Refill: 0  2. Corn of toe - salicylic acid 17 % gel; Apply topically daily. Use no longer than 14 days.  Dispense: 15 g; Refill: 0   Trena Platt, PA-C [facility] 11/23/2015 5:45 PM

## 2015-11-23 NOTE — Patient Instructions (Signed)
You will let us know if this area of your mouth does not go away.  If it does not, I will send you to a dermatologist. You can apply the salicyclic gel to the toe twice per day.  You will place a corn pad around it.  You can do this up to 14 days.    Corns and Calluses Corns are small areas of thickened skin that occur on the top, sides, or tip of a toe. They contain a cone-shaped core with a point that can press on a nerve below. This causes pain. Calluses are areas of thickened skin that can occur anywhere on the body including hands, fingers, palms, soles of the feet, and heels.Calluses are usually larger than corns.  CAUSES  Corns and calluses are caused by rubbing (friction) or pressure, such as from shoes that are too tight or do not fit properly.  RISK FACTORS Corns are more likely to develop in people who have toe deformities, such as hammer toes. Since calluses can occur with friction to any area of the skin, calluses are more likely to develop in people who:   Work with their hands.  Wear shoes that fit poorly, shoes that are too tight, or shoes that are high-heeled.  Have toes deformities. SYMPTOMS Symptoms of a corn or callus include:  A hard growth on the skin.   Pain or tenderness under the skin.   Redness and swelling.   Increased discomfort while wearing tight-fitting shoes. DIAGNOSIS  Corns and calluses may be diagnosed with a medical history and physical exam.  TREATMENT  Corns and calluses may be treated with:  Removing the cause of the friction or pressure. This may include:  Changing your shoes.  Wearing shoe inserts (orthotics) or other protective layers in your shoes, such as a corn pad.  Wearing gloves.  Medicines to help soften skin in the hardened, thickened areas.  Reducing the size of the corn or callus by removing the dead layers of skin.  Antibiotic medicines to treat infection.  Surgery, if a toe deformity is the cause. HOME CARE  INSTRUCTIONS   Take medicines only as directed by your health care provider.  If you were prescribed an antibiotic, finish all of it even if you start to feel better.  Wear shoes that fit well. Avoid wearing high-heeled shoes and shoes that are too tight or too loose.  Wear any padding, protective layers, gloves, or orthotics as directed by your health care provider.  Soak your hands or feet and then use a file or pumice stone to soften your corn or callus. Do this as directed by your health care provider.  Check your corn or callus every day for signs of infection. Watch for:  Redness, swelling, or pain.  Fluid, blood, or pus. SEEK MEDICAL CARE IF:   Your symptoms do not improve with treatment.  You have increased redness, swelling, or pain at the site of your corn or callus.  You have fluid, blood, or pus coming from your corn or callus.  You have new symptoms.   This information is not intended to replace advice given to you by your health care provider. Make sure you discuss any questions you have with your health care provider.   Document Released: 04/21/2004 Document Revised: 11/30/2014 Document Reviewed: 07/12/2014 Elsevier Interactive Patient Education [employer]2016 Elsevier Inc.

## 2015-11-28 ENCOUNTER — Telehealth: Payer: Self-pay

## 2015-11-28 NOTE — Telephone Encounter (Signed)
Pharm sent notice that salicylic acid 17% gel is unavailable, and they are asking for a Rx for another product.

## 2015-12-06 ENCOUNTER — Telehealth: Payer: Self-pay

## 2015-12-06 NOTE — Telephone Encounter (Signed)
Kim with [facility] pharmacy has been speaking with Trena PlattStephanie English about this patient and she called back stating that the product is not available to order    Best [phone]

## 2015-12-06 NOTE — Telephone Encounter (Signed)
contacted pharmacy.  They said they had it and it may be 28.00.  Advised pharmacy to go ahead and fill and alert patient of pick up.  If she can not get this, she always try the over counter.

## 2015-12-06 NOTE — Telephone Encounter (Signed)
I just spoke with the pharmacy about this.  She stated that it was there.  The only reason I called was to see if they had another salicylic acid gel (for a callus/corn) in stock so that I could prescribe it.  Please contact them and see what is actually in stock.  I was on the phone with her for quite a while.

## 2015-12-09 NOTE — Telephone Encounter (Signed)
Called and LM at pharm asking them to call if the pt has not been able to get her Rx. Otherwise we will assume she got it filled.

## 2016-01-06 ENCOUNTER — Ambulatory Visit (INDEPENDENT_AMBULATORY_CARE_PROVIDER_SITE_OTHER): Payer: Commercial Managed Care - HMO | Admitting: Family Medicine

## 2016-01-06 VITALS — BP 122/80 | HR 59 | Temp 98.1°F | Resp 15 | Ht 66.0 in | Wt 156.0 lb

## 2016-01-06 DIAGNOSIS — S0093XA Contusion of unspecified part of head, initial encounter: Secondary | ICD-10-CM

## 2016-01-06 NOTE — Progress Notes (Signed)
[name] is a 75 y.o. female who presents to [facility] today for head injury. Patient fell out of bed and her sleep 2 nights ago. She woke in the morning on the ground with a sore right hip right shoulder and a small bruise in the right side of her face. She feels quite well without any dizziness headaches loss of function or weakness. She notes her hip and shoulder are not bothering her much at all. She discussed her history with her friend who is a nurse who recommended she get evaluated. She feels well and has not tried any medications yet. 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   Past Medical History  Diagnosis Date  . Impaired glucose tolerance 01/14/2011  . Acute sinus infection 01/15/2011  . ANXIETY 10/03/2009  . BARRETTS ESOPHAGUS 10/03/2009  . CAROTID ARTERY DISEASE 08/31/2009  . COLONIC POLYPS, HX OF 08/29/2009  . CONSTIPATION, CHRONIC 10/03/2009  . DEPRESSION 10/03/2009  . DISC DISEASE, CERVICAL 08/29/2009  . DISC DISEASE, LUMBAR 08/29/2009  . Eustachian tube dysfunction 01/15/2011  . GERD 08/29/2009  . HYPERLIPIDEMIA 08/29/2009  . LEG PAIN, BILATERAL 08/29/2009  . LOW BACK PAIN, CHRONIC 11/22/2009  . PERIPHERAL NEUROPATHY 10/03/2009  . PERIPHERAL VASCULAR DISEASE 08/29/2009  . POSITIVE PPD 10/03/2009  . Preventative health care 01/14/2011  . TIA 11/22/2009  . TRANSIENT ISCHEMIC ATTACK, HX OF 08/29/2009  . URETHRAL STRICTURE 10/03/2009  . VOCAL CORD POLYP, HX OF 10/03/2009  . WRIST PAIN, BILATERAL 05/25/2010  . Thyroid nodule 08/09/2011   Past Surgical History  Procedure Laterality Date  . S/p vocal cord polyps  1984    chronic hoarseness  . Tubal ligation    . Stress test negative  2003,2004,2006  . Hx of right ankle fracture    . Uterine prolapse     Social History  Substance Use Topics  . Smoking status: Never Smoker   . Smokeless tobacco: Never Used  . Alcohol Use: No   ROS as above Medications: Current Outpatient Prescriptions  Medication Sig  Dispense Refill  . aspirin 81 MG chewable tablet Chew 81 mg by mouth daily.    . Calcium Carbonate-Vitamin D (CALCIUM-VITAMIN D) 500-200 MG-UNIT tablet Take 1 tablet by mouth daily.    Marland Kitchen CALCIUM PO Take 1 tablet by mouth daily.     . CRESTOR 20 MG tablet TAKE ONE TABLET BY MOUTH EVERY DAY 90 each 3  . Multiple Vitamins-Minerals (MULTIVITAMIN WITH MINERALS) tablet Take 1 tablet by mouth daily.    . naproxen (NAPROSYN) 375 MG tablet TAKE ONE TABLET BY MOUTH THREE TIMES DAILY WITH MEALS 45 tablet 0  . Naproxen Sodium (ALEVE) 220 MG CAPS Take 2 capsules by mouth daily as needed (pain).     . salicylic acid 17 % gel Apply topically daily. Use no longer than 14 days. 15 g 0  . traMADol (ULTRAM) 50 MG tablet Take 1 tablet (50 mg total) by mouth every 8 (eight) hours as needed. (Patient taking differently: Take 50 mg by mouth every 8 (eight) hours as needed for severe pain. ) 45 tablet 0  . traZODone (DESYREL) 50 MG tablet Take 12.5 mg by mouth daily.     Marland Kitchen triamcinolone (KENALOG) 0.1 % paste 1 application to lip twice per day 5 g 0  . [DISCONTINUED] gabapentin (NEURONTIN) 600 MG tablet Take 1 tablet (600 mg total) by mouth 3 (three) times daily as needed. 90 tablet 5   No current  facility-administered medications for this visit.   Allergies  Allergen Reactions  . Atorvastatin     REACTION: myalgia  . Penicillins Hives     Exam:  BP 122/80 mmHg  Pulse 59  Temp(Src) 98.1 F (36.7 C) (Oral)  Resp 15  Ht 5\' 6"  (1.676 m)  Wt 156 lb (70.761 kg)  BMI 25.19 kg/m2  SpO2 99% Gen: Well NAD HEENT: EOMI,  MMM PERRL Lungs: Normal work of breathing. CTABL Heart: RRR no MRG Abd: NABS, Soft. Nondistended, Nontender Exts: Brisk capillary refill, warm and well perfused.  Neuro: Alert and oriented normal speech coordination balance reflexes strength and sensation. Normal gait.  No results found for this or any previous visit (from the past 24 hour(s)). No results found.  Assessment and Plan: 75  y.o. female with head injury. Patient is essentially asymptomatic and looks well. Discussed options. As patient is over 75 years old and has had a reasonable at injury is reasonable to obtain head CT scan. She is very reluctant for emergency room care as there is no one to watch her husband. She agrees to outpatient noncontrast non-stat head CT scan tomorrow morning.  Discussed warning signs or symptoms. Please see discharge instructions. Patient expresses understanding.

## 2016-01-06 NOTE — Patient Instructions (Addendum)
Thank you for coming in today. We will contact you about the location in the time of the CT scan tomorrow. I think you're going to be just fine get better in the next few days. Return as needed  Head Injury, Adult You have received a head injury. It does not appear serious at this time. Headaches and vomiting are common following head injury. It should be easy to awaken from sleeping. Sometimes it is necessary for you to stay in the emergency department for a while for observation. Sometimes admission to the hospital may be needed. After injuries such as yours, most problems occur within the first 24 hours, but side effects may occur up to 7-10 days after the injury. It is important for you to carefully monitor your condition and contact your health care provider or seek immediate medical care if there is a change in your condition. WHAT ARE THE TYPES OF HEAD INJURIES? Head injuries can be as minor as a bump. Some head injuries can be more severe. More severe head injuries include:  A jarring injury to the brain (concussion).  A bruise of the brain (contusion). This mean there is bleeding in the brain that can cause swelling.  A cracked skull (skull fracture).  Bleeding in the brain that collects, clots, and forms a bump (hematoma). WHAT CAUSES A HEAD INJURY? A serious head injury is most likely to happen to someone who is in a car wreck and is not wearing a seat belt. Other causes of major head injuries include bicycle or motorcycle accidents, sports injuries, and falls. HOW ARE HEAD INJURIES DIAGNOSED? A complete history of the event leading to the injury and your current symptoms will be helpful in diagnosing head injuries. Many times, pictures of the brain, such as CT or MRI are needed to see the extent of the injury. Often, an overnight hospital stay is necessary for observation.  WHEN SHOULD I SEEK IMMEDIATE MEDICAL CARE?  You should get help right away if:  You have confusion or  drowsiness.  You feel sick to your stomach (nauseous) or have continued, forceful vomiting.  You have dizziness or unsteadiness that is getting worse.  You have severe, continued headaches not relieved by medicine. Only take over-the-counter or prescription medicines for pain, fever, or discomfort as directed by your health care provider.  You do not have normal function of the arms or legs or are unable to walk.  You notice changes in the black spots in the center of the colored part of your eye (pupil).  You have a clear or bloody fluid coming from your nose or ears.  You have a loss of vision. During the next 24 hours after the injury, you must stay with someone who can watch you for the warning signs. This person should contact local emergency services (911 in the [place] if you have seizures, you become unconscious, or you are unable to wake up. HOW CAN I PREVENT A HEAD INJURY IN THE FUTURE? The most important factor for preventing major head injuries is avoiding motor vehicle accidents. To minimize the potential for damage to your head, it is crucial to wear seat belts while riding in motor vehicles. Wearing helmets while bike riding and playing collision sports (like football) is also helpful. Also, avoiding dangerous activities around the house will further help reduce your risk of head injury.  WHEN CAN I RETURN TO NORMAL ACTIVITIES AND ATHLETICS? You should be reevaluated by your health care provider before returning to these  activities. If you have any of the following symptoms, you should not return to activities or contact sports until 1 week after the symptoms have stopped:  Persistent headache.  Dizziness or vertigo.  Poor attention and concentration.  Confusion.  Memory problems.  Nausea or vomiting.  Fatigue or tire easily.  Irritability.  Intolerant of bright lights or loud noises.  Anxiety or depression.  Disturbed sleep. MAKE SURE YOU:   Understand these  instructions.  Will watch your condition.  Will get help right away if you are not doing well or get worse.   This information is not intended to replace advice given to you by your health care provider. Make sure you discuss any questions you have with your health care provider.   Document Released: 07/16/2005 Document Revised: 08/06/2014 Document Reviewed: 03/23/2013 Elsevier Interactive Patient Education 2016 [employer]    IF you received an x-ray today, you will receive an invoice from [facility]. Please contact [facility] at [phone] with questions or concerns regarding your invoice.   IF you received labwork today, you will receive an invoice from [facility]. Please contact [facility] at [phone] with questions or concerns regarding your invoice.   Our billing staff will not be able to assist you with questions regarding bills from these companies.  You will be contacted with the lab results as soon as they are available. The fastest way to get your results is to activate your My Chart account. Instructions are located on the last page of this paperwork. If you have not heard from us regarding the results in 2 weeks, please contact this office.

## 2016-01-11 ENCOUNTER — Ambulatory Visit (HOSPITAL_BASED_OUTPATIENT_CLINIC_OR_DEPARTMENT_OTHER)
Admission: RE | Admit: 2016-01-11 | Discharge: 2016-01-11 | Disposition: A | Discharge status: Home / Self Care | Payer: Commercial Managed Care - HMO | Source: Ambulatory Visit | Attending: Family Medicine | Admitting: Family Medicine

## 2016-01-11 DIAGNOSIS — W19XXXA Unspecified fall, initial encounter: Secondary | ICD-10-CM | POA: Insufficient documentation

## 2016-01-11 DIAGNOSIS — S0093XA Contusion of unspecified part of head, initial encounter: Secondary | ICD-10-CM

## 2016-03-06 ENCOUNTER — Other Ambulatory Visit: Payer: Self-pay | Admitting: Internal Medicine

## 2016-03-06 DIAGNOSIS — M79602 Pain in left arm: Secondary | ICD-10-CM

## 2016-03-06 DIAGNOSIS — R413 Other amnesia: Secondary | ICD-10-CM

## 2016-03-16 ENCOUNTER — Ambulatory Visit
Admission: RE | Admit: 2016-03-16 | Discharge: 2016-03-16 | Disposition: A | Discharge status: Home / Self Care | Payer: Commercial Managed Care - HMO | Source: Ambulatory Visit | Attending: Internal Medicine | Admitting: Internal Medicine

## 2016-03-16 DIAGNOSIS — R413 Other amnesia: Secondary | ICD-10-CM

## 2016-03-16 DIAGNOSIS — M79602 Pain in left arm: Secondary | ICD-10-CM

## 2016-03-16 MED ORDER — GADOBENATE DIMEGLUMINE 529 MG/ML IV SOLN
14.0000 mL | Freq: Once | INTRAVENOUS | Status: AC | PRN
  Administered 2016-03-16: 14 mL via INTRAVENOUS

## 2016-06-01 ENCOUNTER — Emergency Department (HOSPITAL_COMMUNITY): Payer: Commercial Managed Care - HMO

## 2016-06-01 ENCOUNTER — Encounter (HOSPITAL_COMMUNITY): Payer: Self-pay

## 2016-06-01 ENCOUNTER — Observation Stay (HOSPITAL_COMMUNITY)
Admission: EM | Admit: 2016-06-01 | Discharge: 2016-06-03 | Disposition: A | Discharge status: Home / Self Care | Payer: Commercial Managed Care - HMO | Attending: Internal Medicine | Admitting: Internal Medicine

## 2016-06-01 ENCOUNTER — Observation Stay (HOSPITAL_COMMUNITY): Payer: Commercial Managed Care - HMO

## 2016-06-01 ENCOUNTER — Other Ambulatory Visit (HOSPITAL_COMMUNITY): Payer: Self-pay | Admitting: Radiology

## 2016-06-01 DIAGNOSIS — Z7982 Long term (current) use of aspirin: Secondary | ICD-10-CM | POA: Diagnosis not present

## 2016-06-01 DIAGNOSIS — F329 Major depressive disorder, single episode, unspecified: Secondary | ICD-10-CM | POA: Diagnosis not present

## 2016-06-01 DIAGNOSIS — K219 Gastro-esophageal reflux disease without esophagitis: Secondary | ICD-10-CM | POA: Diagnosis not present

## 2016-06-01 DIAGNOSIS — Z823 Family history of stroke: Secondary | ICD-10-CM | POA: Diagnosis not present

## 2016-06-01 DIAGNOSIS — G8194 Hemiplegia, unspecified affecting left nondominant side: Secondary | ICD-10-CM

## 2016-06-01 DIAGNOSIS — G629 Polyneuropathy, unspecified: Secondary | ICD-10-CM | POA: Diagnosis not present

## 2016-06-01 DIAGNOSIS — M545 Low back pain, unspecified: Secondary | ICD-10-CM | POA: Diagnosis present

## 2016-06-01 DIAGNOSIS — Z888 Allergy status to other drugs, medicaments and biological substances status: Secondary | ICD-10-CM | POA: Insufficient documentation

## 2016-06-01 DIAGNOSIS — G3189 Other specified degenerative diseases of nervous system: Secondary | ICD-10-CM | POA: Insufficient documentation

## 2016-06-01 DIAGNOSIS — Z833 Family history of diabetes mellitus: Secondary | ICD-10-CM | POA: Diagnosis not present

## 2016-06-01 DIAGNOSIS — G8929 Other chronic pain: Secondary | ICD-10-CM | POA: Insufficient documentation

## 2016-06-01 DIAGNOSIS — I779 Disorder of arteries and arterioles, unspecified: Secondary | ICD-10-CM | POA: Diagnosis present

## 2016-06-01 DIAGNOSIS — Z801 Family history of malignant neoplasm of trachea, bronchus and lung: Secondary | ICD-10-CM | POA: Diagnosis not present

## 2016-06-01 DIAGNOSIS — G459 Transient cerebral ischemic attack, unspecified: Principal | ICD-10-CM | POA: Diagnosis present

## 2016-06-01 DIAGNOSIS — I639 Cerebral infarction, unspecified: Secondary | ICD-10-CM | POA: Diagnosis not present

## 2016-06-01 DIAGNOSIS — I739 Peripheral vascular disease, unspecified: Secondary | ICD-10-CM | POA: Diagnosis not present

## 2016-06-01 DIAGNOSIS — R262 Difficulty in walking, not elsewhere classified: Secondary | ICD-10-CM

## 2016-06-01 DIAGNOSIS — F419 Anxiety disorder, unspecified: Secondary | ICD-10-CM | POA: Diagnosis not present

## 2016-06-01 DIAGNOSIS — Z880 Allergy status to penicillin: Secondary | ICD-10-CM | POA: Diagnosis not present

## 2016-06-01 DIAGNOSIS — I672 Cerebral atherosclerosis: Secondary | ICD-10-CM | POA: Diagnosis not present

## 2016-06-01 DIAGNOSIS — Z8249 Family history of ischemic heart disease and other diseases of the circulatory system: Secondary | ICD-10-CM | POA: Diagnosis not present

## 2016-06-01 DIAGNOSIS — E785 Hyperlipidemia, unspecified: Secondary | ICD-10-CM | POA: Diagnosis present

## 2016-06-01 LAB — DIFFERENTIAL
BASOS ABS: 0 10*3/uL (ref 0.0–0.1)
BASOS PCT: 0 %
EOS ABS: 0.2 10*3/uL (ref 0.0–0.7)
Eosinophils Relative: 4 %
Lymphocytes Relative: 52 %
Lymphs Abs: 2.3 10*3/uL (ref 0.7–4.0)
Monocytes Absolute: 0.4 10*3/uL (ref 0.1–1.0)
Monocytes Relative: 9 %
NEUTROS PCT: 35 %
Neutro Abs: 1.6 10*3/uL — ABNORMAL LOW (ref 1.7–7.7)

## 2016-06-01 LAB — RAPID URINE DRUG SCREEN, HOSP PERFORMED
Amphetamines: NOT DETECTED
BARBITURATES: NOT DETECTED
BENZODIAZEPINES: NOT DETECTED
COCAINE: NOT DETECTED
Opiates: NOT DETECTED
TETRAHYDROCANNABINOL: NOT DETECTED

## 2016-06-01 LAB — CBC
HCT: 44.8 % (ref 36.0–46.0)
Hemoglobin: 14.7 g/dL (ref 12.0–15.0)
MCH: 31.7 pg (ref 26.0–34.0)
MCHC: 32.8 g/dL (ref 30.0–36.0)
MCV: 96.6 fL (ref 78.0–100.0)
Platelets: 174 10*3/uL (ref 150–400)
RBC: 4.64 MIL/uL (ref 3.87–5.11)
RDW: 14.6 % (ref 11.5–15.5)
WBC: 4.4 10*3/uL (ref 4.0–10.5)

## 2016-06-01 LAB — URINALYSIS, ROUTINE W REFLEX MICROSCOPIC
Bilirubin Urine: NEGATIVE
GLUCOSE, UA: NEGATIVE mg/dL
HGB URINE DIPSTICK: NEGATIVE
Ketones, ur: NEGATIVE mg/dL
Leukocytes, UA: NEGATIVE
Nitrite: NEGATIVE
PROTEIN: NEGATIVE mg/dL
Specific Gravity, Urine: 1.005 (ref 1.005–1.030)
pH: 7 (ref 5.0–8.0)

## 2016-06-01 LAB — COMPREHENSIVE METABOLIC PANEL
ALBUMIN: 4.3 g/dL (ref 3.5–5.0)
ALT: 24 U/L (ref 14–54)
AST: 29 U/L (ref 15–41)
Alkaline Phosphatase: 57 U/L (ref 38–126)
Anion gap: 7 (ref 5–15)
BUN: 10 mg/dL (ref 6–20)
CHLORIDE: 106 mmol/L (ref 101–111)
CO2: 29 mmol/L (ref 22–32)
Calcium: 9.9 mg/dL (ref 8.9–10.3)
Creatinine, Ser: 0.8 mg/dL (ref 0.44–1.00)
GFR calc Af Amer: >60 mL/min (ref >60)
GFR calc non Af Amer: >60 mL/min (ref >60)
GLUCOSE: 109 mg/dL — AB (ref 65–99)
POTASSIUM: 3.9 mmol/L (ref 3.5–5.1)
SODIUM: 142 mmol/L (ref 135–145)
Total Bilirubin: 1 mg/dL (ref 0.3–1.2)
Total Protein: 7.1 g/dL (ref 6.5–8.1)

## 2016-06-01 LAB — ETHANOL

## 2016-06-01 LAB — I-STAT TROPONIN, ED: TROPONIN I, POC: 0 ng/mL (ref 0.00–0.08)

## 2016-06-01 LAB — PROTIME-INR
INR: 1.1
Prothrombin Time: 14.3 seconds (ref 11.4–15.2)

## 2016-06-01 LAB — APTT: APTT: 34 s (ref 24–36)

## 2016-06-01 LAB — MAGNESIUM: MAGNESIUM: 2.1 mg/dL (ref 1.7–2.4)

## 2016-06-01 MED ORDER — ASPIRIN 300 MG RE SUPP
300.0000 mg | Freq: Every day | RECTAL | Status: DC
  Filled 2016-06-01: qty 1

## 2016-06-01 MED ORDER — ZOLPIDEM TARTRATE 5 MG PO TABS: 5.0000 mg | ORAL_TABLET | Freq: Every evening | ORAL | Status: DC | PRN

## 2016-06-01 MED ORDER — CALCIUM CARBONATE 1250 (500 CA) MG PO TABS
1.0000 | ORAL_TABLET | Freq: Every day | ORAL | Status: DC
  Administered 2016-06-02 – 2016-06-03 (×2): 500 mg via ORAL
  Filled 2016-06-01 (×2): qty 1

## 2016-06-01 MED ORDER — ASPIRIN 81 MG PO CHEW: 324.0000 mg | CHEWABLE_TABLET | Freq: Once | ORAL | Status: DC

## 2016-06-01 MED ORDER — ASPIRIN 325 MG PO TABS
325.0000 mg | ORAL_TABLET | Freq: Every day | ORAL | Status: DC
  Administered 2016-06-02 – 2016-06-03 (×2): 325 mg via ORAL
  Filled 2016-06-01 (×2): qty 1

## 2016-06-01 MED ORDER — DM-GUAIFENESIN ER 30-600 MG PO TB12: 1.0000 | ORAL_TABLET | Freq: Two times a day (BID) | ORAL | Status: DC | PRN

## 2016-06-01 MED ORDER — PANTOPRAZOLE SODIUM 40 MG PO TBEC
40.0000 mg | DELAYED_RELEASE_TABLET | Freq: Every day | ORAL | Status: DC
  Administered 2016-06-02 – 2016-06-03 (×2): 40 mg via ORAL
  Filled 2016-06-01 (×2): qty 1

## 2016-06-01 MED ORDER — SENNOSIDES-DOCUSATE SODIUM 8.6-50 MG PO TABS: 1.0000 | ORAL_TABLET | Freq: Every evening | ORAL | Status: DC | PRN

## 2016-06-01 MED ORDER — TRAZODONE HCL 50 MG PO TABS: 25.0000 mg | ORAL_TABLET | Freq: Every evening | ORAL | Status: DC | PRN

## 2016-06-01 MED ORDER — CALCIUM CARBONATE-VITAMIN D 500-200 MG-UNIT PO TABS
1.0000 | ORAL_TABLET | Freq: Every evening | ORAL | Status: DC
  Administered 2016-06-02: 1 via ORAL
  Filled 2016-06-01: qty 1

## 2016-06-01 MED ORDER — ADULT MULTIVITAMIN W/MINERALS CH
1.0000 | ORAL_TABLET | Freq: Every day | ORAL | Status: DC
  Administered 2016-06-02 – 2016-06-03 (×2): 1 via ORAL
  Filled 2016-06-01 (×2): qty 1

## 2016-06-01 MED ORDER — ENOXAPARIN SODIUM 40 MG/0.4ML ~~LOC~~ SOLN
40.0000 mg | SUBCUTANEOUS | Status: DC
  Administered 2016-06-02 – 2016-06-03 (×2): 40 mg via SUBCUTANEOUS
  Filled 2016-06-01 (×2): qty 0.4

## 2016-06-01 MED ORDER — SODIUM CHLORIDE 0.9 % IV SOLN
INTRAVENOUS | Status: DC
  Administered 2016-06-01: 22:00:00 via INTRAVENOUS

## 2016-06-01 MED ORDER — ACETAMINOPHEN 325 MG PO TABS
650.0000 mg | ORAL_TABLET | Freq: Four times a day (QID) | ORAL | Status: DC | PRN
  Administered 2016-06-02 (×2): 650 mg via ORAL
  Filled 2016-06-01 (×2): qty 2

## 2016-06-01 MED ORDER — STROKE: EARLY STAGES OF RECOVERY BOOK
Freq: Once | Status: AC
  Administered 2016-06-01: 22:00:00
  Filled 2016-06-01: qty 1

## 2016-06-01 NOTE — ED Notes (Signed)
PT in MRI.

## 2016-06-01 NOTE — ED Notes (Signed)
[name] at bedside. During his assessment pt states "minimal change in speech" different answer from triage.

## 2016-06-01 NOTE — Consult Note (Signed)
Admission H&P    Chief Complaint: Acute onset of left-sided weakness and numbness, and speech difficulty.  HPI: [name] is an 75 y.o. female history of TIA, peripheral vascular disease including carotid disease, hyperlipidemia, depression and anxiety, presented to the ED at [facility] following acute onset of left-sided weakness and numbness involving face arm and leg, as well as speech difficulty. Onset was at 5:30 PM today. She's been on antiplatelet therapy with aspirin daily. Symptoms began to improved after arriving in the ED and subsequently resolved. CT scan of her head showed no acute intracranial abnormality. MRI of the brain showed no acute stroke. Chronic small vessel ischemic changes were noted. MRA showed no large vessel occlusion nor significant stenosis. NIH stroke score at the time of this evaluation was 0.  LSN: 5:30 PM on 06/01/2016 tPA Given: No: Deficits rapidly resolved mRankin:  Past Medical History:  Diagnosis Date  . Acute sinus infection 01/15/2011  . ANXIETY 10/03/2009  . BARRETTS ESOPHAGUS 10/03/2009  . CAROTID ARTERY DISEASE 08/31/2009  . COLONIC POLYPS, HX OF 08/29/2009  . CONSTIPATION, CHRONIC 10/03/2009  . DEPRESSION 10/03/2009  . DISC DISEASE, CERVICAL 08/29/2009  . DISC DISEASE, LUMBAR 08/29/2009  . Eustachian tube dysfunction 01/15/2011  . GERD 08/29/2009  . HYPERLIPIDEMIA 08/29/2009  . Impaired glucose tolerance 01/14/2011  . LEG PAIN, BILATERAL 08/29/2009  . LOW BACK PAIN, CHRONIC 11/22/2009  . PERIPHERAL NEUROPATHY 10/03/2009  . PERIPHERAL VASCULAR DISEASE 08/29/2009  . POSITIVE PPD 10/03/2009  . Preventative health care 01/14/2011  . Thyroid nodule 08/09/2011  . TIA 11/22/2009  . TRANSIENT ISCHEMIC ATTACK, HX OF 08/29/2009  . URETHRAL STRICTURE 10/03/2009  . VOCAL CORD POLYP, HX OF 10/03/2009  . WRIST PAIN, BILATERAL 05/25/2010    Past Surgical History:  Procedure Laterality Date  . hx of right ankle fracture    . s/p vocal cord polyps  1984   chronic  hoarseness  . stress test negative  2003,2004,2006  . TUBAL LIGATION    . uterine prolapse      Family History  Problem Relation Age of Onset  . Stroke Mother   . Heart disease Mother   . Hypertension Mother   . Diabetes Mother   . Multiple myeloma Sister   . Cancer Other     lung   Social History:  reports that she has never smoked. She has never used smokeless tobacco. She reports that she does not drink alcohol or use drugs.  Allergies:  Allergies  Allergen Reactions  . Atorvastatin     REACTION: myalgia  . Penicillins Hives and Itching    Has patient had a PCN reaction causing immediate rash, facial/tongue/throat swelling, SOB or lightheadedness with hypotension: no Has patient had a PCN reaction causing severe rash involving mucus membranes or skin necrosis: no Has patient had a PCN reaction that required hospitalization : no Has patient had a PCN reaction occurring within the last 10 years: no If all of the above answers are "NO", then may proceed with Cephalosporin use.     Medications Prior to Admission  Medication Sig Dispense Refill  . aspirin EC 81 MG tablet Take 81 mg by mouth daily.    . Calcium Carbonate-Vitamin D (CALCIUM-VITAMIN D) 500-200 MG-UNIT tablet Take 1 tablet by mouth every evening.     Marland Kitchen CALCIUM PO Take 1 tablet by mouth daily.     Marland Kitchen dextromethorphan-guaiFENesin (MUCINEX DM) 30-600 MG 12hr tablet Take 1 tablet by mouth 2 (two) times daily as needed for  cough.    . Multiple Vitamins-Minerals (MULTIVITAMIN WITH MINERALS) tablet Take 1 tablet by mouth daily.    . Naproxen Sodium (ALEVE) 220 MG CAPS Take 2 capsules by mouth daily as needed (pain).     . traZODone (DESYREL) 50 MG tablet Take 25 mg by mouth at bedtime as needed for sleep (pt will get nightmares if she takes 55m).     . CRESTOR 20 MG tablet TAKE ONE TABLET BY MOUTH EVERY DAY (Patient not taking: Reported on 06/01/2016) 90 each 3  . triamcinolone (KENALOG) 0.1 % paste 1 application to lip  twice per day (Patient not taking: Reported on 06/01/2016) 5 g 0    ROS: History obtained from the patient  General ROS: negative for - chills, fatigue, fever, night sweats, weight gain or weight loss Psychological ROS: negative for - behavioral disorder, hallucinations, memory difficulties, mood swings or suicidal ideation Ophthalmic ROS: negative for - blurry vision, double vision, eye pain or loss of vision ENT ROS: negative for - epistaxis, nasal discharge, oral lesions, sore throat, tinnitus or vertigo Allergy and Immunology ROS: negative for - hives or itchy/watery eyes Hematological and Lymphatic ROS: negative for - bleeding problems, bruising or swollen lymph nodes Endocrine ROS: negative for - galactorrhea, hair pattern changes, polydipsia/polyuria or temperature intolerance Respiratory ROS: negative for - cough, hemoptysis, shortness of breath or wheezing Cardiovascular ROS: negative for - chest pain, dyspnea on exertion, edema or irregular heartbeat Gastrointestinal ROS: negative for - abdominal pain, diarrhea, hematemesis, nausea/vomiting or stool incontinence Genito-Urinary ROS: negative for - dysuria, hematuria, incontinence or urinary frequency/urgency Musculoskeletal ROS: negative for - joint swelling or muscular weakness Neurological ROS: as noted in HPI Dermatological ROS: negative for rash and skin lesion changes  Physical Examination: Blood pressure (!) 152/78, pulse (!) 55, temperature 98.4 F (36.9 C), temperature source Oral, resp. rate 20, height _0  (1.676 m), weight 72.1 kg (159 lb), SpO2 100 %.  HEENT-  Normocephalic, no lesions, without obvious abnormality.  Normal external eye and conjunctiva.  Normal TM's bilaterally.  Normal auditory canals and external ears. Normal external nose, mucus membranes and septum.  Normal pharynx. Neck supple with no masses, nodes, nodules or enlargement. Cardiovascular - regular rate and rhythm, S1, S2 normal, no murmur, click,  rub or gallop Lungs - chest clear, no wheezing, rales, normal symmetric air entry Abdomen - soft, non-tender; bowel sounds normal; no masses,  no organomegaly Extremities - no joint deformities, effusion, or inflammation  Neurologic Examination: Mental Status: Alert, oriented, thought content appropriate.  Speech fluent without evidence of aphasia. Able to follow commands without difficulty. Cranial Nerves: II-Visual fields were normal. III/IV/VI-Pupils were equal and reacted normally to light. Extraocular movements were full and conjugate.    V/VII-no facial numbness and no facial weakness. VIII-normal. X-normal speech and symmetrical palatal movement. XI: trapezius strength/neck flexion strength normal bilaterally XII-midline tongue extension with normal strength. Motor: 5/5 bilaterally with normal tone and bulk Sensory: Normal throughout. Deep Tendon Reflexes: 1+ and symmetric. Plantars: Flexor bilaterally Cerebellar: Normal finger-to-nose testing. Carotid auscultation: Normal  Results for orders placed or performed during the hospital encounter of 06/01/16 (from the past 48 hour(s))  Magnesium     Status: None   Collection Time: 06/01/16  6:27 PM  Result Value Ref Range   Magnesium 2.1 1.7 - 2.4 mg/dL  Ethanol     Status: None   Collection Time: 06/01/16  6:27 PM  Result Value Ref Range   Alcohol, Ethyl (B) <5 <5 mg/dL  Comment:        LOWEST DETECTABLE LIMIT FOR SERUM ALCOHOL IS 5 mg/dL FOR MEDICAL PURPOSES ONLY   Protime-INR     Status: None   Collection Time: 06/01/16  6:27 PM  Result Value Ref Range   Prothrombin Time 14.3 11.4 - 15.2 seconds   INR 1.10   APTT     Status: None   Collection Time: 06/01/16  6:27 PM  Result Value Ref Range   aPTT 34 24 - 36 seconds  CBC     Status: None   Collection Time: 06/01/16  6:27 PM  Result Value Ref Range   WBC 4.4 4.0 - 10.5 K/uL   RBC 4.64 3.87 - 5.11 MIL/uL   Hemoglobin 14.7 12.0 - 15.0 g/dL   HCT 44.8 36.0 - 46.0  %   MCV 96.6 78.0 - 100.0 fL   MCH 31.7 26.0 - 34.0 pg   MCHC 32.8 30.0 - 36.0 g/dL   RDW 14.6 11.5 - 15.5 %   Platelets 174 150 - 400 K/uL  Differential     Status: Abnormal   Collection Time: 06/01/16  6:27 PM  Result Value Ref Range   Neutrophils Relative % 35 %   Neutro Abs 1.6 (L) 1.7 - 7.7 K/uL   Lymphocytes Relative 52 %   Lymphs Abs 2.3 0.7 - 4.0 K/uL   Monocytes Relative 9 %   Monocytes Absolute 0.4 0.1 - 1.0 K/uL   Eosinophils Relative 4 %   Eosinophils Absolute 0.2 0.0 - 0.7 K/uL   Basophils Relative 0 %   Basophils Absolute 0.0 0.0 - 0.1 K/uL  Comprehensive metabolic panel     Status: Abnormal   Collection Time: 06/01/16  6:27 PM  Result Value Ref Range   Sodium 142 135 - 145 mmol/L   Potassium 3.9 3.5 - 5.1 mmol/L   Chloride 106 101 - 111 mmol/L   CO2 29 22 - 32 mmol/L   Glucose, Bld 109 (H) 65 - 99 mg/dL   BUN 10 6 - 20 mg/dL   Creatinine, Ser 0.80 0.44 - 1.00 mg/dL   Calcium 9.9 8.9 - 10.3 mg/dL   Total Protein 7.1 6.5 - 8.1 g/dL   Albumin 4.3 3.5 - 5.0 g/dL   AST 29 15 - 41 U/L   ALT 24 14 - 54 U/L   Alkaline Phosphatase 57 38 - 126 U/L   Total Bilirubin 1.0 0.3 - 1.2 mg/dL   GFR calc non Af Amer >60 >60 mL/min   GFR calc Af Amer >60 >60 mL/min    Comment: (NOTE) The eGFR has been calculated using the CKD EPI equation. This calculation has not been validated in all clinical situations. eGFR's persistently <60 mL/min signify possible Chronic Kidney Disease.    Anion gap 7 5 - 15  Urine rapid drug screen (hosp performed)not at [facility]     Status: None   Collection Time: 06/01/16  6:27 PM  Result Value Ref Range   Opiates NONE DETECTED NONE DETECTED   Cocaine NONE DETECTED NONE DETECTED   Benzodiazepines NONE DETECTED NONE DETECTED   Amphetamines NONE DETECTED NONE DETECTED   Tetrahydrocannabinol NONE DETECTED NONE DETECTED   Barbiturates NONE DETECTED NONE DETECTED    Comment:        DRUG SCREEN FOR MEDICAL PURPOSES ONLY.  IF CONFIRMATION IS  NEEDED FOR ANY PURPOSE, NOTIFY LAB WITHIN 5 DAYS.        LOWEST DETECTABLE LIMITS FOR URINE DRUG SCREEN Drug Class  Cutoff (ng/mL) Amphetamine      1000 Barbiturate      200 Benzodiazepine   854 Tricyclics       627 Opiates          300 Cocaine          300 THC              50   Urinalysis, Routine w reflex microscopic (not at [facility])     Status: None   Collection Time: 06/01/16  6:27 PM  Result Value Ref Range   Color, Urine YELLOW YELLOW   APPearance CLEAR CLEAR   Specific Gravity, Urine 1.005 1.005 - 1.030   pH 7.0 5.0 - 8.0   Glucose, UA NEGATIVE NEGATIVE mg/dL   Hgb urine dipstick NEGATIVE NEGATIVE   Bilirubin Urine NEGATIVE NEGATIVE   Ketones, ur NEGATIVE NEGATIVE mg/dL   Protein, ur NEGATIVE NEGATIVE mg/dL   Nitrite NEGATIVE NEGATIVE   Leukocytes, UA NEGATIVE NEGATIVE    Comment: MICROSCOPIC NOT DONE ON URINES WITH NEGATIVE PROTEIN, BLOOD, LEUKOCYTES, NITRITE, OR GLUCOSE <1000 mg/dL.  I-stat troponin, ED (not at [facility])     Status: None   Collection Time: 06/01/16  6:40 PM  Result Value Ref Range   Troponin i, poc 0.00 0.00 - 0.08 ng/mL   Comment 3            Comment: Due to the release kinetics of cTnI, a negative result within the first hours of the onset of symptoms does not rule out myocardial infarction with certainty. If myocardial infarction is still suspected, repeat the test at appropriate intervals.    Mr Brain Wo Contrast  Result Date: 06/01/2016 CLINICAL DATA:  Initial evaluation for acute slurred speech, left-sided weakness, numbness, left-sided facial droop. EXAM: MRI HEAD WITHOUT CONTRAST MRA HEAD WITHOUT CONTRAST TECHNIQUE: Multiplanar, multiecho pulse sequences of the brain and surrounding structures were obtained without intravenous contrast. Angiographic images of the head were obtained using MRA technique without contrast. COMPARISON:  Prior CT from earlier the same day as well as previous MRI from 03/16/2016. FINDINGS: MRI HEAD FINDINGS  Brain: Mild generalized cerebral atrophy with chronic microvascular ischemic disease. No abnormal foci of restricted diffusion to suggest acute or subacute ischemia. Gray-white matter differentiation maintained. No evidence for acute or chronic intracranial hemorrhage. No areas of chronic infarction. No mass lesion, midline shift, or mass effect. No hydrocephalus. No extra-axial fluid collection. Major dural sinuses are patent. Pituitary gland normal. Vascular: Major intracranial vascular flow voids are maintained. Skull and upper cervical spine: Craniocervical junction normal. Visualized upper cervical spine unremarkable. Bone marrow signal intensity normal. No scalp soft tissue abnormality. Sinuses/Orbits: Globes and orbital soft tissues within normal limits. Mild scattered mucosal thickening within the ethmoidal air cells. Paranasal sinuses are otherwise clear. No mastoid effusion. Inner ear structures normal. MRA HEAD FINDINGS ANTERIOR CIRCULATION: Distal cervical segments of the internal carotid arteries are patent with antegrade flow. Distal cervical right ICA mildly tortuous. Petrous, cavernous, and supraclinoid segments widely patent without flow-limiting stenosis. A1 segments patent. Anterior communicating artery normal. Anterior cerebral arteries patent to their distal aspects. M1 segments patent without stenosis or occlusion. MCA bifurcations normal. Distal MCA branches well opacified and symmetric.No significant atheromatous disease or stenosis identified within the anterior circulation. POSTERIOR CIRCULATION: Vertebral arteries patent to the vertebrobasilar junction. Posterior inferior cerebral arteries patent proximally. Vertebrobasilar junction normal. Basilar artery mildly tortuous but patent to its distal aspect. Superior cerebellar arteries patent bilaterally. Both of the posterior cerebral artery supplied mainly  via the basilar artery and are well opacified to their distal aspects. No significant  atheromatous disease or stenosis identified within the posterior circulation. No aneurysm or vascular malformation. IMPRESSION: MRI HEAD IMPRESSION: 1. No acute intracranial infarct or other process identified. 2. Mild atrophy with chronic microvascular ischemic disease. MRA HEAD IMPRESSION: Negative intracranial MRA. No large or proximal arterial branch occlusion. No high-grade or correctable stenosis. Overall, relatively mild intracranial atherosclerotic disease for patient age. Electronically Signed   By: Jeannine Boga M.D.   On: 06/01/2016 22:03   Mr Jodene Nam Head/brain YH Cm  Result Date: 06/01/2016 CLINICAL DATA:  Initial evaluation for acute slurred speech, left-sided weakness, numbness, left-sided facial droop. EXAM: MRI HEAD WITHOUT CONTRAST MRA HEAD WITHOUT CONTRAST TECHNIQUE: Multiplanar, multiecho pulse sequences of the brain and surrounding structures were obtained without intravenous contrast. Angiographic images of the head were obtained using MRA technique without contrast. COMPARISON:  Prior CT from earlier the same day as well as previous MRI from 03/16/2016. FINDINGS: MRI HEAD FINDINGS Brain: Mild generalized cerebral atrophy with chronic microvascular ischemic disease. No abnormal foci of restricted diffusion to suggest acute or subacute ischemia. Gray-white matter differentiation maintained. No evidence for acute or chronic intracranial hemorrhage. No areas of chronic infarction. No mass lesion, midline shift, or mass effect. No hydrocephalus. No extra-axial fluid collection. Major dural sinuses are patent. Pituitary gland normal. Vascular: Major intracranial vascular flow voids are maintained. Skull and upper cervical spine: Craniocervical junction normal. Visualized upper cervical spine unremarkable. Bone marrow signal intensity normal. No scalp soft tissue abnormality. Sinuses/Orbits: Globes and orbital soft tissues within normal limits. Mild scattered mucosal thickening within the  ethmoidal air cells. Paranasal sinuses are otherwise clear. No mastoid effusion. Inner ear structures normal. MRA HEAD FINDINGS ANTERIOR CIRCULATION: Distal cervical segments of the internal carotid arteries are patent with antegrade flow. Distal cervical right ICA mildly tortuous. Petrous, cavernous, and supraclinoid segments widely patent without flow-limiting stenosis. A1 segments patent. Anterior communicating artery normal. Anterior cerebral arteries patent to their distal aspects. M1 segments patent without stenosis or occlusion. MCA bifurcations normal. Distal MCA branches well opacified and symmetric.No significant atheromatous disease or stenosis identified within the anterior circulation. POSTERIOR CIRCULATION: Vertebral arteries patent to the vertebrobasilar junction. Posterior inferior cerebral arteries patent proximally. Vertebrobasilar junction normal. Basilar artery mildly tortuous but patent to its distal aspect. Superior cerebellar arteries patent bilaterally. Both of the posterior cerebral artery supplied mainly via the basilar artery and are well opacified to their distal aspects. No significant atheromatous disease or stenosis identified within the posterior circulation. No aneurysm or vascular malformation. IMPRESSION: MRI HEAD IMPRESSION: 1. No acute intracranial infarct or other process identified. 2. Mild atrophy with chronic microvascular ischemic disease. MRA HEAD IMPRESSION: Negative intracranial MRA. No large or proximal arterial branch occlusion. No high-grade or correctable stenosis. Overall, relatively mild intracranial atherosclerotic disease for patient age. Electronically Signed   By: Jeannine Boga M.D.   On: 06/01/2016 22:03   Ct Head Code Stroke Wo Contrast  Result Date: 06/01/2016 CLINICAL DATA:  Code stroke. LEFT-sided numbness beginning at 1710 hours. History of carotid artery disease, hyperlipidemia. EXAM: CT HEAD WITHOUT CONTRAST TECHNIQUE: Contiguous axial images  were obtained from the base of the skull through the vertex without intravenous contrast. COMPARISON:  MRI head March 16, 2016. FINDINGS: BRAIN: The ventricles and sulci are normal for age. No intraparenchymal hemorrhage, mass effect nor midline shift. Patchy supratentorial white matter hypodensities with than expected for patient's age, though non-specific are most compatible with chronic  small vessel ischemic disease. No acute large vascular territory infarcts. No abnormal extra-axial fluid collections. Basal cisterns are patent. VASCULAR: Mild calcific atherosclerosis of the carotid siphons. SKULL: No skull fracture. No significant scalp soft tissue swelling. SINUSES/ORBITS: The mastoid air-cells and included paranasal sinuses are well-aerated.The included ocular globes and orbital contents are non-suspicious. OTHER: None. ASPECTS [facility] Stroke Program Early CT Score) - Ganglionic level infarction (caudate, lentiform nuclei, internal capsule, insula, M1-M3 cortex): 7 - Supraganglionic infarction (M4-M6 cortex): 3 Total score (0-10 with 10 being normal): 10 IMPRESSION: 1. No acute intracranial process ; negative CT HEAD for age. 2. ASPECTS is 10. Critical Value/emergent results were called by telephone at the time of interpretation on 06/01/2016 at 6:31 pm to Dr. Delora Fuel , who verbally acknowledged these results. Electronically Signed   By: Elon Alas M.D.   On: 06/01/2016 18:32    Assessment: 75 y.o. female with multiple risk factors for stroke likely experienced a right subcortical MCA territory TIA, which has resolved at this point.  Stroke Risk Factors - carotid stenosis, family history and hyperlipidemia  Plan: 1. HgbA1c, fasting lipid panel 2. PT consult, OT consult, Speech consult 3. Echocardiogram 4. Carotid dopplers 5. Prophylactic therapy-Antiplatelet med: Aspirin  6. Risk factor modification 7. Telemetry monitoring  C.R. [name] Kindred, MD [facility] [phone]  06/01/2016, 11:52 PM

## 2016-06-01 NOTE — H&P (Addendum)
History and Physical    [name] ZOX:[id number] DOB: 16-Sep-1940 DOA: 06/01/2016  Referring MD/NP/PA:   PCP: Gwynneth Aliment, MD   Patient coming from:  The patient is coming from home.  At baseline, pt is independent for most of ADL.   Chief Complaint: Left-sided weakness, numbness, slurred speech, left facial droop  HPI: [name] is a 75 y.o. female with medical history significant of hyperlipidemia, GERD, depression, anxiety, TIA, PVD, carotid artery stenosis, chronic back pain, who presents with left-sided weakness, numbness, slurred speech, left facial droop.  Per patient's granddaughter, patient started having left-sided weakness, numbness, slurred speech and left facial droop at about 5:30 PM. Her symptoms have been gradually improving. Currently she still has mild left leg weakness, left facial droop and mild slurred speech. No vision change or hearing loss. Patient denies chest pain, shortness of breath, nausea, vomiting, abdominal pain, symptoms for UTI. No fever or chills.  ED Course: pt was found to have WBC 4.4, INR 1.1, previously 34, negative troponin, negative UDS, negative urinalysis, electrolytes renal function okay, temperature normal, bradycardia, O2 sat 100% on room air. CT head is negative for acute intracranial abnormalities. Patient is placed on telemetry bed for observation.  Review of Systems:   General: no fevers, chills, no changes in body weight, has fatigue HEENT: no blurry vision, hearing changes or sore throat Respiratory: no dyspnea, coughing, wheezing CV: no chest pain, no palpitations GI: no nausea, vomiting, abdominal pain, diarrhea, constipation GU: no dysuria, burning on urination, increased urinary frequency, hematuria  Ext: no leg edema Neuro: left sided weakness, numbness. Left facial droop and slurred speech. no vision change or hearing loss Skin: no rash, no skin tear. MSK: No muscle spasm, no deformity, no limitation of range of  movement in spin Heme: No easy bruising.  Travel history: No recent long distant travel.  Allergy:  Allergies  Allergen Reactions  . Atorvastatin     REACTION: myalgia  . Penicillins Hives and Itching    Has patient had a PCN reaction causing immediate rash, facial/tongue/throat swelling, SOB or lightheadedness with hypotension: no Has patient had a PCN reaction causing severe rash involving mucus membranes or skin necrosis: no Has patient had a PCN reaction that required hospitalization : no Has patient had a PCN reaction occurring within the last 10 years: no If all of the above answers are "NO", then may proceed with Cephalosporin use.     Past Medical History:  Diagnosis Date  . Acute sinus infection 01/15/2011  . ANXIETY 10/03/2009  . BARRETTS ESOPHAGUS 10/03/2009  . CAROTID ARTERY DISEASE 08/31/2009  . COLONIC POLYPS, HX OF 08/29/2009  . CONSTIPATION, CHRONIC 10/03/2009  . DEPRESSION 10/03/2009  . DISC DISEASE, CERVICAL 08/29/2009  . DISC DISEASE, LUMBAR 08/29/2009  . Eustachian tube dysfunction 01/15/2011  . GERD 08/29/2009  . HYPERLIPIDEMIA 08/29/2009  . Impaired glucose tolerance 01/14/2011  . LEG PAIN, BILATERAL 08/29/2009  . LOW BACK PAIN, CHRONIC 11/22/2009  . PERIPHERAL NEUROPATHY 10/03/2009  . PERIPHERAL VASCULAR DISEASE 08/29/2009  . POSITIVE PPD 10/03/2009  . Preventative health care 01/14/2011  . Thyroid nodule 08/09/2011  . TIA 11/22/2009  . TRANSIENT ISCHEMIC ATTACK, HX OF 08/29/2009  . URETHRAL STRICTURE 10/03/2009  . VOCAL CORD POLYP, HX OF 10/03/2009  . WRIST PAIN, BILATERAL 05/25/2010    Past Surgical History:  Procedure Laterality Date  . hx of right ankle fracture    . s/p vocal cord polyps  1984   chronic hoarseness  . stress test  negative  2003,2004,2006  . TUBAL LIGATION    . uterine prolapse      Social History:  reports that she has never smoked. She has never used smokeless tobacco. She reports that she does not drink alcohol or use drugs.  Family History:    Family History  Problem Relation Age of Onset  . Stroke Mother   . Heart disease Mother   . Hypertension Mother   . Diabetes Mother   . Multiple myeloma Sister   . Cancer Other     lung     Prior to Admission medications   Medication Sig Start Date End Date Taking? Authorizing Provider  aspirin 81 MG chewable tablet Chew 81 mg by mouth daily.    Historical Provider, MD  Calcium Carbonate-Vitamin D (CALCIUM-VITAMIN D) 500-200 MG-UNIT tablet Take 1 tablet by mouth daily.    Historical Provider, MD  CALCIUM PO Take 1 tablet by mouth daily.     Historical Provider, MD  CRESTOR 20 MG tablet TAKE ONE TABLET BY MOUTH EVERY DAY 11/14/10   Corwin Levins, MD  Multiple Vitamins-Minerals (MULTIVITAMIN WITH MINERALS) tablet Take 1 tablet by mouth daily.    Historical Provider, MD  naproxen (NAPROSYN) 375 MG tablet TAKE ONE TABLET BY MOUTH THREE TIMES DAILY WITH MEALS 03/14/15   Carmelina Dane, MD  Naproxen Sodium (ALEVE) 220 MG CAPS Take 2 capsules by mouth daily as needed (pain).     Historical Provider, MD  salicylic acid 17 % gel Apply topically daily. Use no longer than 14 days. 11/23/15   Collie Siad English, PA  traMADol (ULTRAM) 50 MG tablet Take 1 tablet (50 mg total) by mouth every 8 (eight) hours as needed. Patient taking differently: Take 50 mg by mouth every 8 (eight) hours as needed for severe pain.  10/27/14   Carmelina Dane, MD  traZODone (DESYREL) 50 MG tablet Take 12.5 mg by mouth daily.     Historical Provider, MD  triamcinolone (KENALOG) 0.1 % paste 1 application to lip twice per day 11/23/15   Garnetta Buddy, [place]    Physical Exam: Vitals:   06/01/16 1900 06/01/16 1915 06/01/16 1930 06/01/16 1941  BP: 119/87 144/90 141/59   Pulse: (!) 57 (!) 55 (!) 53   Resp: 13 17 18    Temp:      TempSrc:      SpO2: 100% 100% 100% 100%  Weight:       General: Not in acute distress HEENT:       Eyes: PERRL, EOMI, no scleral icterus.       ENT: No discharge from the ears and  nose, no pharynx injection, no tonsillar enlargement.        Neck: No JVD, no bruit, no mass felt. Heme: No neck lymph node enlargement. Cardiac: S1/S2, RRR, No murmurs, No gallops or rubs. Respiratory: No rales, wheezing, rhonchi or rubs. GI: Soft, nondistended, nontender, no rebound pain, no organomegaly, BS present. GU: No hematuria Ext: No pitting leg edema bilaterally. 2+DP/PT pulse bilaterally. Musculoskeletal: No joint deformities, No joint redness or warmth, no limitation of ROM in spin. Skin: No rashes.  Neuro: Alert, oriented X3, cranial nerves II-XII grossly intact except for left facial droop and mild tongue deviation to the Left. Muscle strength 4/5 in left leg and 5/5 in all other extremities, sensation to light touch intact. Knee reflex 1+ bilaterally. Negative Babinski's sign. Normal finger to nose test. Psych: Patient is not psychotic, no suicidal or hemocidal ideation.  Labs on Admission: I have personally reviewed following labs and imaging studies  CBC:  Recent Labs Lab 06/01/16 1827  WBC 4.4  NEUTROABS 1.6*  HGB 14.7  HCT 44.8  MCV 96.6  PLT 174   Basic Metabolic Panel:  Recent Labs Lab 06/01/16 1827  NA 142  K 3.9  CL 106  CO2 29  GLUCOSE 109*  BUN 10  CREATININE 0.80  CALCIUM 9.9  MG 2.1   GFR: Estimated Creatinine Clearance: 56 mL/min (by C-G formula based on SCr of 0.8 mg/dL). Liver Function Tests:  Recent Labs Lab 06/01/16 1827  AST 29  ALT 24  ALKPHOS 57  BILITOT 1.0  PROT 7.1  ALBUMIN 4.3   No results for input(s): LIPASE, AMYLASE in the last 168 hours. No results for input(s): AMMONIA in the last 168 hours. Coagulation Profile:  Recent Labs Lab 06/01/16 1827  INR 1.10   Cardiac Enzymes: No results for input(s): CKTOTAL, CKMB, CKMBINDEX, TROPONINI in the last 168 hours. BNP (last 3 results) No results for input(s): PROBNP in the last 8760 hours. HbA1C: No results for input(s): HGBA1C in the last 72 hours. CBG: No  results for input(s): GLUCAP in the last 168 hours. Lipid Profile: No results for input(s): CHOL, HDL, LDLCALC, TRIG, CHOLHDL, LDLDIRECT in the last 72 hours. Thyroid Function Tests: No results for input(s): TSH, T4TOTAL, FREET4, T3FREE, THYROIDAB in the last 72 hours. Anemia Panel: No results for input(s): VITAMINB12, FOLATE, FERRITIN, TIBC, IRON, RETICCTPCT in the last 72 hours. Urine analysis:    Component Value Date/Time   COLORURINE YELLOW 06/01/2016 1827   APPEARANCEUR CLEAR 06/01/2016 1827   LABSPEC 1.005 06/01/2016 1827   PHURINE 7.0 06/01/2016 1827   GLUCOSEU NEGATIVE 06/01/2016 1827   GLUCOSEU NEGATIVE 05/14/2011 1702   HGBUR NEGATIVE 06/01/2016 1827   BILIRUBINUR NEGATIVE 06/01/2016 1827   KETONESUR NEGATIVE 06/01/2016 1827   PROTEINUR NEGATIVE 06/01/2016 1827   UROBILINOGEN 0.2 08/21/2014 2251   NITRITE NEGATIVE 06/01/2016 1827   LEUKOCYTESUR NEGATIVE 06/01/2016 1827   Sepsis Labs: @LABRCNTIP (procalcitonin:4,lacticidven:4) )No results found for this or any previous visit (from the past 240 hour(s)).   Radiological Exams on Admission: Ct Head Code Stroke Wo Contrast  Result Date: 06/01/2016 CLINICAL DATA:  Code stroke. LEFT-sided numbness beginning at 1710 hours. History of carotid artery disease, hyperlipidemia. EXAM: CT HEAD WITHOUT CONTRAST TECHNIQUE: Contiguous axial images were obtained from the base of the skull through the vertex without intravenous contrast. COMPARISON:  MRI head March 16, 2016. FINDINGS: BRAIN: The ventricles and sulci are normal for age. No intraparenchymal hemorrhage, mass effect nor midline shift. Patchy supratentorial white matter hypodensities with than expected for patient's age, though non-specific are most compatible with chronic small vessel ischemic disease. No acute large vascular territory infarcts. No abnormal extra-axial fluid collections. Basal cisterns are patent. VASCULAR: Mild calcific atherosclerosis of the carotid siphons.  SKULL: No skull fracture. No significant scalp soft tissue swelling. SINUSES/ORBITS: The mastoid air-cells and included paranasal sinuses are well-aerated.The included ocular globes and orbital contents are non-suspicious. OTHER: None. ASPECTS [facility] Stroke Program Early CT Score) - Ganglionic level infarction (caudate, lentiform nuclei, internal capsule, insula, M1-M3 cortex): 7 - Supraganglionic infarction (M4-M6 cortex): 3 Total score (0-10 with 10 being normal): 10 IMPRESSION: 1. No acute intracranial process ; negative CT HEAD for age. 2. ASPECTS is 10. Critical Value/emergent results were called by telephone at the time of interpretation on 06/01/2016 at 6:31 pm to Dr. Dione Booze , who verbally acknowledged these results. Electronically Signed  By: Awilda Metro M.D.   On: 06/01/2016 18:32     EKG: Independently reviewed.  Sinus rhythm, QTC 516, T-wave inversion in V1 to V5.   Assessment/Plan Principal Problem:   CVA (cerebral vascular accident) (HCC) Active Problems:   Hyperlipemia   Carotid artery disease (HCC)   GERD   LOW BACK PAIN, CHRONIC   TIA (transient ischemic attack)   CVA (cerebral vascular accident): Patient's symptoms are concerning for TIA versus ischemic stroke. CT head as no acute intracranial abnormalities. Patient is on 81 mg aspirin daily at home for hx of TIA. Neurology, Dr. Cherylynn Ridges was consulted  - will place on tele bed for obs - Neurology was consulted by EDP, will follow up recommendations. -Atrial fibrillation: not present  - Risk factor modification: HgbA1c, fasting lipid panel - MRI, MRA of the brain without contrast  - PT consult, OT consult - Bedside swallowing screen was ordered, will get speech consult in AM - 2 d Echocardiogram  - Ekg  - Carotid dopplers  - Aspirin, increase dose from 81 to 325 mg daily  HLD: Last LDL was 176. Pt is allergic to statin, Crestor. -Check FLP. If still has hyperlipidemia, may consider to start  zetia  Carotid artery disease (HCC): -on ASA -f/u carotid Doppler  GERD: -Protonix  Chronic back pain: -prn tylenol -hold Naproxen    DVT ppx: SQ Lovenox Code Status: Full code Family Communication: Yes, patient's husband and granddaughter    at bed side Disposition Plan:  Anticipate discharge back to previous home environment Consults called:  Neurology, Dr. Dr. Cherylynn Ridges  Admission status: Obs / tele   Date of Service 06/01/2016    Lorretta Harp [facility] Pager [phone]  If 7PM-7AM, please contact night-coverage [email or web address] Password [facility] 06/01/2016, 8:24 PM

## 2016-06-01 NOTE — ED Notes (Signed)
Called report to [id number] ( spoke with amanda) .

## 2016-06-01 NOTE — ED Notes (Addendum)
No blood return with IV start but site flushes well with no signs of infiltration and pain. Will attempt 2nd IV/blood draw with pt return from CT. Pt transport to CT at present time.

## 2016-06-01 NOTE — ED Notes (Signed)
Bed: WA23 Expected date:  Expected time:  Means of arrival:  Comments: EMS-weak 

## 2016-06-01 NOTE — ED Provider Notes (Addendum)
[employer] DEPT Provider Note   CSN: [id number] Arrival date & time: 06/01/16  9417     History   Chief Complaint Chief Complaint  Patient presents with  . Code Stroke    HPI [name] is a 75 y.o. female.  She was driving her husband to the barber and noted that her left leg gave out on her when she got out of the car. She was last known normal at 5:30 PM. In addition to the left leg giving out, she noticed numbness in her left arm, leg and the left side of her face. She is noted slight difficulty with speaking. There is no headache. She does have history of prior transient ischemic attacks. Numbness seems to be subsiding but there is still some residual numbness in the left side of her face. She has not tried to walk so was not able to assess if her strength is improving. Past history includes hyperlipidemia, peripheral vascular disease, carotid artery disease.   The history is provided by the patient.    Past Medical History:  Diagnosis Date  . Acute sinus infection 01/15/2011  . ANXIETY 10/03/2009  . BARRETTS ESOPHAGUS 10/03/2009  . CAROTID ARTERY DISEASE 08/31/2009  . COLONIC POLYPS, HX OF 08/29/2009  . CONSTIPATION, CHRONIC 10/03/2009  . DEPRESSION 10/03/2009  . [place] DISEASE, CERVICAL 08/29/2009  . [place] DISEASE, LUMBAR 08/29/2009  . Eustachian tube dysfunction 01/15/2011  . GERD 08/29/2009  . HYPERLIPIDEMIA 08/29/2009  . Impaired glucose tolerance 01/14/2011  . LEG PAIN, BILATERAL 08/29/2009  . LOW BACK PAIN, CHRONIC 11/22/2009  . PERIPHERAL NEUROPATHY 10/03/2009  . PERIPHERAL VASCULAR DISEASE 08/29/2009  . POSITIVE PPD 10/03/2009  . Preventative health care 01/14/2011  . Thyroid nodule 08/09/2011  . TIA 11/22/2009  . TRANSIENT ISCHEMIC ATTACK, HX OF 08/29/2009  . URETHRAL STRICTURE 10/03/2009  . VOCAL CORD POLYP, HX OF 10/03/2009  . WRIST PAIN, BILATERAL 05/25/2010    Patient Active Problem List   Diagnosis Date Noted  . Complicated migraine 40/81/4481  . Multinodular goiter  08/09/2011  . Lumbar pain with radiation down right leg 04/11/2011  . Rash 04/11/2011  . Impaired glucose tolerance 01/14/2011  . Preventative health care 01/14/2011  . WRIST PAIN, BILATERAL 05/25/2010  . TIA 11/22/2009  . LOW BACK PAIN, CHRONIC 11/22/2009  . ANXIETY 10/03/2009  . DEPRESSION 10/03/2009  . PERIPHERAL NEUROPATHY 10/03/2009  . BARRETTS ESOPHAGUS 10/03/2009  . CONSTIPATION, CHRONIC 10/03/2009  . URETHRAL STRICTURE 10/03/2009  . POSITIVE PPD 10/03/2009  . VOCAL CORD POLYP, HX OF 10/03/2009  . CAROTID ARTERY DISEASE 08/31/2009  . Hyperlipemia 08/29/2009  . PERIPHERAL VASCULAR DISEASE 08/29/2009  . GERD 08/29/2009  . [place] DISEASE, CERVICAL 08/29/2009  . [place] DISEASE, LUMBAR 08/29/2009  . LEG PAIN, BILATERAL 08/29/2009  . TRANSIENT ISCHEMIC ATTACK, HX OF 08/29/2009  . COLONIC POLYPS, HX OF 08/29/2009    Past Surgical History:  Procedure Laterality Date  . hx of right ankle fracture    . s/p vocal cord polyps  1984   chronic hoarseness  . stress test negative  2003,2004,2006  . TUBAL LIGATION    . uterine prolapse      OB History    No data available       Home Medications    Prior to Admission medications   Medication Sig Start Date End Date Taking? Authorizing Provider  aspirin 81 MG chewable tablet Chew 81 mg by mouth daily.    Historical Provider, MD  Calcium Carbonate-Vitamin D (CALCIUM-VITAMIN D) 500-200 MG-UNIT  tablet Take 1 tablet by mouth daily.    Historical Provider, MD  CALCIUM PO Take 1 tablet by mouth daily.     Historical Provider, MD  CRESTOR 20 MG tablet TAKE ONE TABLET BY MOUTH EVERY DAY 11/14/10   Biagio Borg, MD  Multiple Vitamins-Minerals (MULTIVITAMIN WITH MINERALS) tablet Take 1 tablet by mouth daily.    Historical Provider, MD  naproxen (NAPROSYN) 375 MG tablet TAKE ONE TABLET BY MOUTH THREE TIMES DAILY WITH MEALS 03/14/15   Roselee Culver, MD  Naproxen Sodium (ALEVE) 220 MG CAPS Take 2 capsules by mouth daily as needed (pain).      Historical Provider, MD  salicylic acid 17 % gel Apply topically daily. Use no longer than 14 days. 11/23/15   Dorian Heckle English, PA  traMADol (ULTRAM) 50 MG tablet Take 1 tablet (50 mg total) by mouth every 8 (eight) hours as needed. Patient taking differently: Take 50 mg by mouth every 8 (eight) hours as needed for severe pain.  10/27/14   Roselee Culver, MD  traZODone (DESYREL) 50 MG tablet Take 12.5 mg by mouth daily.     Historical Provider, MD  triamcinolone (KENALOG) 0.1 % paste 1 application to lip twice per day 11/23/15   Joretta Bachelor, PA    Family History Family History  Problem Relation Age of Onset  . Stroke Mother   . Heart disease Mother   . Hypertension Mother   . Diabetes Mother   . Multiple myeloma Sister   . Cancer Other     lung    Social History Social History  Substance Use Topics  . Smoking status: Never Smoker  . Smokeless tobacco: Never Used  . Alcohol use No     Allergies   Atorvastatin and Penicillins   Review of Systems Review of Systems  All other systems reviewed and are negative.    Physical Exam Updated Vital Signs BP 175/90 (BP Location: Left Arm)   Pulse 66   Temp 98.5 F (36.9 C) (Oral)   Resp 16   Wt 156 lb (70.8 kg)   SpO2 100%   BMI 25.18 kg/m   Physical Exam  Nursing note and vitals reviewed.  75 year old female, resting comfortably and in no acute distress. Vital signs are Significant for hypertension. Oxygen saturation is 100%, which is normal. Head is normocephalic and atraumatic. PERRLA, EOMI. Oropharynx is clear. Neck is nontender and supple without adenopathy or JVD. Back is nontender and there is no CVA tenderness. Lungs are clear without rales, wheezes, or rhonchi. Chest is nontender. Heart has regular rate and rhythm without murmur. Abdomen is soft, flat, nontender without masses or hepatosplenomegaly and peristalsis is normoactive. Extremities have no cyanosis or edema, full range of motion is  present. Skin is warm and dry without rash. Neurologic: She is awake, alert, oriented. Speech is spontaneous and appropriate with mild dysarthria. There is a mild left central facial droop present. Remainder cranial nerves are intact. There is slight decreased sensation in the left side of her face in the second division of the trigeminal nerve, and slight decrease sensation in the left thigh. No other sensory deficits identified. There is no pronator drift. There is very slight weakness of the left arm with strength 4+/5. There is mild to moderate weakness of the left leg with strength 4/5.  ED Treatments / Results  Labs (all labs ordered are listed, but only abnormal results are displayed) Labs Reviewed  MAGNESIUM  ETHANOL  PROTIME-INR  APTT  CBC  DIFFERENTIAL  COMPREHENSIVE METABOLIC PANEL  RAPID URINE DRUG SCREEN, HOSP PERFORMED  URINALYSIS, ROUTINE W REFLEX MICROSCOPIC (NOT AT [facility])  Randolm Idol, ED    EKG  EKG Interpretation  Date/Time:  Friday June 01 2016 18:04:24 EDT Ventricular Rate:  66 PR Interval:    QRS Duration: 91 QT Interval:  492 QTC Calculation: 516 R Axis:   75 Text Interpretation:  Sinus rhythm Nonspecific T abnrm, anterolateral leads Prolonged QT interval Baseline wander in lead(s) V1 When compared with ECG of 01/13/2015, QT has lengthened Confirmed by Roxanne Mins  MD, Juelz Claar ([id number]) on 06/01/2016 6:14:25 PM       Radiology Ct Head Code Stroke Wo Contrast  Result Date: 06/01/2016 CLINICAL DATA:  Code stroke. LEFT-sided numbness beginning at 1710 hours. History of carotid artery disease, hyperlipidemia. EXAM: CT HEAD WITHOUT CONTRAST TECHNIQUE: Contiguous axial images were obtained from the base of the skull through the vertex without intravenous contrast. COMPARISON:  MRI head March 16, 2016. FINDINGS: BRAIN: The ventricles and sulci are normal for age. No intraparenchymal hemorrhage, mass effect nor midline shift. Patchy supratentorial white matter  hypodensities with than expected for patient's age, though non-specific are most compatible with chronic small vessel ischemic disease. No acute large vascular territory infarcts. No abnormal extra-axial fluid collections. Basal cisterns are patent. VASCULAR: Mild calcific atherosclerosis of the carotid siphons. SKULL: No skull fracture. No significant scalp soft tissue swelling. SINUSES/ORBITS: The mastoid air-cells and included paranasal sinuses are well-aerated.The included ocular globes and orbital contents are non-suspicious. OTHER: None. ASPECTS [facility] Stroke Program Early CT Score) - Ganglionic level infarction (caudate, lentiform nuclei, internal capsule, insula, M1-M3 cortex): 7 - Supraganglionic infarction (M4-M6 cortex): 3 Total score (0-10 with 10 being normal): 10 IMPRESSION: 1. No acute intracranial process ; negative CT HEAD for age. 2. ASPECTS is 10. Critical Value/emergent results were called by telephone at the time of interpretation on 06/01/2016 at 6:31 pm to Dr. Delora Fuel , who verbally acknowledged these results. Electronically Signed   By: Elon Alas M.D.   On: 06/01/2016 18:32    Procedures Procedures (including critical care time) CRITICAL CARE Performed by: [email or web address] Total critical care time: 40 minutes Critical care time was exclusive of separately billable procedures and treating other patients. Critical care was necessary to treat or prevent imminent or life-threatening deterioration. Critical care was time spent personally by me on the following activities: development of treatment plan with patient and/or surrogate as well as nursing, discussions with consultants, evaluation of patient's response to treatment, examination of patient, obtaining history from patient or surrogate, ordering and performing treatments and interventions, ordering and review of laboratory studies, ordering and review of radiographic studies, pulse oximetry and re-evaluation of patient's  condition.  Medications Ordered in ED Medications  aspirin chewable tablet 324 mg (not administered)     Initial Impression / Assessment and Plan / ED Course  I have reviewed the triage vital signs and the nursing notes.  Pertinent labs & imaging results that were available during my care of the patient were reviewed by me and considered in my medical decision making (see chart for details). I have discussed the CT images with the radiologist.  Clinical Course   Stroke versus transient ischemic attack. Symptoms are improving. Code stroke is activated and patient sent for emergent CT of head. Old records are reviewed confirming hospitalization in January 2016 for transient ischemic attack. At that time, echocardiogram was normal and carotid duplex scan showed 40-59% ICA  stenosis on the left. That would not correlate with today's presentation. MRA of the head did not show any stenosis.  6:56 PM Patient is reevaluated. Facial droop and mildly dysarthric speech are still present, but there are no longer any sensory deficits and strength is now 5/5 in all muscle groups. Case has been discussed with Dr. Cristobal Goldmann of neurology service who feels that she is not a candidate for thrombolytic therapy due to low NIH stroke scale score. Plan is to admit her for TIA/stroke workup.  8:06 PM Patient is reevaluated. Speech is now back to normal but mild facial droop persists. Case has been discussed with Dr. Blaine Hamper of [facility] who agrees to admit the patient for TIA/stroke evaluation.  Final Clinical Impressions(s) / ED Diagnoses   Final diagnoses:  Acute left hemiparesis ([place])  Transient cerebral ischemia, unspecified type    New Prescriptions New Prescriptions   No medications on file     Delora Fuel, MD 34/74/25 [id number]    Delora Fuel, MD 87/56/43 [id number]

## 2016-06-01 NOTE — ED Notes (Signed)
Back from MRI.

## 2016-06-01 NOTE — ED Triage Notes (Addendum)
Pt complaint of left side numbness onset 1710. Pt denies change in speech. With neuro exam. Sensory less to left cheek and left leg. Weakness noted to left extremities.  Face symmetrical. Pt states no changes to speech.

## 2016-06-02 ENCOUNTER — Encounter (HOSPITAL_COMMUNITY): Payer: Commercial Managed Care - HMO

## 2016-06-02 DIAGNOSIS — G8194 Hemiplegia, unspecified affecting left nondominant side: Secondary | ICD-10-CM | POA: Diagnosis not present

## 2016-06-02 DIAGNOSIS — G459 Transient cerebral ischemic attack, unspecified: Secondary | ICD-10-CM | POA: Diagnosis not present

## 2016-06-02 DIAGNOSIS — I639 Cerebral infarction, unspecified: Secondary | ICD-10-CM | POA: Diagnosis not present

## 2016-06-02 MED ORDER — SODIUM CHLORIDE 0.9 % IV BOLUS (SEPSIS)
1000.0000 mL | Freq: Once | INTRAVENOUS | Status: AC
  Administered 2016-06-02: 1000 mL via INTRAVENOUS

## 2016-06-02 NOTE — Progress Notes (Addendum)
PROGRESS NOTE    [name]  JXB:[id number] DOB: 06-18-1941 DOA: 06/01/2016 PCP: Gwynneth Aliment, MD  Brief Narrative:   Assessment & Plan: L sided weakness -suspect TIA -L sided weakness and L facial droop on admission, improved -MRI and MRI without acute findings -ASA 325mg  daily, was on ASA 81mg  at home, ? Change to Plavix, will defer to Neurology -continue crestor -FU ECHO and carotid duplex -Neurology consulted -Pt/OT/SLP consulted  HLD:  -allergic to statin, Crestor.  Carotid artery disease (HCC): -on ASA -f/u carotid Doppler  GERD: -Protonix  Chronic back pain: -prn tylenol -hold Naproxen   DVT ppx: SQ Lovenox Code Status: Full code Family Communication: daughter at bedside Disposition Plan: Home pending workup  Consultants: Neurology   Subjective: Feels better, symptoms improving  Objective: Vitals:   06/02/16 0255 06/02/16 0504 06/02/16 0704 06/02/16 0920  BP: (!) 84/47 (!) 100/51 108/69   Pulse: (!) 54 (!) 55 (!) 57   Resp: 20 20 20    Temp: 97.7 F (36.5 C) 98.1 F (36.7 C) 97.9 F (36.6 C)   TempSrc: Oral Oral Oral Other (Comment)  SpO2: 99% 100% 100%   Weight:      Height:        Intake/Output Summary (Last 24 hours) at 06/02/16 1308 Last data filed at 06/02/16 0344  Gross per 24 hour  Intake           431.25 ml  Output              450 ml  Net           -18.75 ml   Filed Weights   06/01/16 1805 06/01/16 2251  Weight: 70.8 kg (156 lb) 72.1 kg (159 lb)    Examination:  General exam: Appears calm and comfortable  Respiratory system: Clear to auscultation. Respiratory effort normal. Cardiovascular system: S1 & S2 heard, RRR. No JVD, murmurs, rubs, gallops or clicks. No pedal edema. Gastrointestinal system: Abdomen is nondistended, soft and nontender. No organomegaly or masses felt. Normal bowel sounds heard. Central nervous system: Alert and oriented. No focal neurological deficits. Extremities: Symmetric 5 x 5  power. Skin: No rashes, lesions or ulcers Psychiatry: Judgement and insight appear normal. Mood & affect appropriate.     Data Reviewed: I have personally reviewed following labs and imaging studies  CBC:  Recent Labs Lab 06/01/16 1827  WBC 4.4  NEUTROABS 1.6*  HGB 14.7  HCT 44.8  MCV 96.6  PLT 174   Basic Metabolic Panel:  Recent Labs Lab 06/01/16 1827  NA 142  K 3.9  CL 106  CO2 29  GLUCOSE 109*  BUN 10  CREATININE 0.80  CALCIUM 9.9  MG 2.1   GFR: Estimated Creatinine Clearance: 60.8 mL/min (by C-G formula based on SCr of 0.8 mg/dL). Liver Function Tests:  Recent Labs Lab 06/01/16 1827  AST 29  ALT 24  ALKPHOS 57  BILITOT 1.0  PROT 7.1  ALBUMIN 4.3   No results for input(s): LIPASE, AMYLASE in the last 168 hours. No results for input(s): AMMONIA in the last 168 hours. Coagulation Profile:  Recent Labs Lab 06/01/16 1827  INR 1.10   Cardiac Enzymes: No results for input(s): CKTOTAL, CKMB, CKMBINDEX, TROPONINI in the last 168 hours. BNP (last 3 results) No results for input(s): PROBNP in the last 8760 hours. HbA1C: No results for input(s): HGBA1C in the last 72 hours. CBG: No results for input(s): GLUCAP in the last 168 hours. Lipid Profile: No results for input(s): CHOL,  HDL, LDLCALC, TRIG, CHOLHDL, LDLDIRECT in the last 72 hours. Thyroid Function Tests: No results for input(s): TSH, T4TOTAL, FREET4, T3FREE, THYROIDAB in the last 72 hours. Anemia Panel: No results for input(s): VITAMINB12, FOLATE, FERRITIN, TIBC, IRON, RETICCTPCT in the last 72 hours. Urine analysis:    Component Value Date/Time   COLORURINE YELLOW 06/01/2016 1827   APPEARANCEUR CLEAR 06/01/2016 1827   LABSPEC 1.005 06/01/2016 1827   PHURINE 7.0 06/01/2016 1827   GLUCOSEU NEGATIVE 06/01/2016 1827   GLUCOSEU NEGATIVE 05/14/2011 1702   HGBUR NEGATIVE 06/01/2016 1827   BILIRUBINUR NEGATIVE 06/01/2016 1827   KETONESUR NEGATIVE 06/01/2016 1827   PROTEINUR NEGATIVE  06/01/2016 1827   UROBILINOGEN 0.2 08/21/2014 2251   NITRITE NEGATIVE 06/01/2016 1827   LEUKOCYTESUR NEGATIVE 06/01/2016 1827   Sepsis Labs: @LABRCNTIP (procalcitonin:4,lacticidven:4)  )No results found for this or any previous visit (from the past 240 hour(s)).       Radiology Studies: Mr Brain Wo Contrast  Result Date: 06/01/2016 CLINICAL DATA:  Initial evaluation for acute slurred speech, left-sided weakness, numbness, left-sided facial droop. EXAM: MRI HEAD WITHOUT CONTRAST MRA HEAD WITHOUT CONTRAST TECHNIQUE: Multiplanar, multiecho pulse sequences of the brain and surrounding structures were obtained without intravenous contrast. Angiographic images of the head were obtained using MRA technique without contrast. COMPARISON:  Prior CT from earlier the same day as well as previous MRI from 03/16/2016. FINDINGS: MRI HEAD FINDINGS Brain: Mild generalized cerebral atrophy with chronic microvascular ischemic disease. No abnormal foci of restricted diffusion to suggest acute or subacute ischemia. Gray-white matter differentiation maintained. No evidence for acute or chronic intracranial hemorrhage. No areas of chronic infarction. No mass lesion, midline shift, or mass effect. No hydrocephalus. No extra-axial fluid collection. Major dural sinuses are patent. Pituitary gland normal. Vascular: Major intracranial vascular flow voids are maintained. Skull and upper cervical spine: Craniocervical junction normal. Visualized upper cervical spine unremarkable. Bone marrow signal intensity normal. No scalp soft tissue abnormality. Sinuses/Orbits: Globes and orbital soft tissues within normal limits. Mild scattered mucosal thickening within the ethmoidal air cells. Paranasal sinuses are otherwise clear. No mastoid effusion. Inner ear structures normal. MRA HEAD FINDINGS ANTERIOR CIRCULATION: Distal cervical segments of the internal carotid arteries are patent with antegrade flow. Distal cervical right ICA mildly  tortuous. Petrous, cavernous, and supraclinoid segments widely patent without flow-limiting stenosis. A1 segments patent. Anterior communicating artery normal. Anterior cerebral arteries patent to their distal aspects. M1 segments patent without stenosis or occlusion. MCA bifurcations normal. Distal MCA branches well opacified and symmetric.No significant atheromatous disease or stenosis identified within the anterior circulation. POSTERIOR CIRCULATION: Vertebral arteries patent to the vertebrobasilar junction. Posterior inferior cerebral arteries patent proximally. Vertebrobasilar junction normal. Basilar artery mildly tortuous but patent to its distal aspect. Superior cerebellar arteries patent bilaterally. Both of the posterior cerebral artery supplied mainly via the basilar artery and are well opacified to their distal aspects. No significant atheromatous disease or stenosis identified within the posterior circulation. No aneurysm or vascular malformation. IMPRESSION: MRI HEAD IMPRESSION: 1. No acute intracranial infarct or other process identified. 2. Mild atrophy with chronic microvascular ischemic disease. MRA HEAD IMPRESSION: Negative intracranial MRA. No large or proximal arterial branch occlusion. No high-grade or correctable stenosis. Overall, relatively mild intracranial atherosclerotic disease for patient age. Electronically Signed   By: Rise Mu M.D.   On: 06/01/2016 22:03   Mr Maxine Glenn Head/brain NW Cm  Result Date: 06/01/2016 CLINICAL DATA:  Initial evaluation for acute slurred speech, left-sided weakness, numbness, left-sided facial droop. EXAM: MRI HEAD WITHOUT CONTRAST  MRA HEAD WITHOUT CONTRAST TECHNIQUE: Multiplanar, multiecho pulse sequences of the brain and surrounding structures were obtained without intravenous contrast. Angiographic images of the head were obtained using MRA technique without contrast. COMPARISON:  Prior CT from earlier the same day as well as previous MRI from  03/16/2016. FINDINGS: MRI HEAD FINDINGS Brain: Mild generalized cerebral atrophy with chronic microvascular ischemic disease. No abnormal foci of restricted diffusion to suggest acute or subacute ischemia. Gray-white matter differentiation maintained. No evidence for acute or chronic intracranial hemorrhage. No areas of chronic infarction. No mass lesion, midline shift, or mass effect. No hydrocephalus. No extra-axial fluid collection. Major dural sinuses are patent. Pituitary gland normal. Vascular: Major intracranial vascular flow voids are maintained. Skull and upper cervical spine: Craniocervical junction normal. Visualized upper cervical spine unremarkable. Bone marrow signal intensity normal. No scalp soft tissue abnormality. Sinuses/Orbits: Globes and orbital soft tissues within normal limits. Mild scattered mucosal thickening within the ethmoidal air cells. Paranasal sinuses are otherwise clear. No mastoid effusion. Inner ear structures normal. MRA HEAD FINDINGS ANTERIOR CIRCULATION: Distal cervical segments of the internal carotid arteries are patent with antegrade flow. Distal cervical right ICA mildly tortuous. Petrous, cavernous, and supraclinoid segments widely patent without flow-limiting stenosis. A1 segments patent. Anterior communicating artery normal. Anterior cerebral arteries patent to their distal aspects. M1 segments patent without stenosis or occlusion. MCA bifurcations normal. Distal MCA branches well opacified and symmetric.No significant atheromatous disease or stenosis identified within the anterior circulation. POSTERIOR CIRCULATION: Vertebral arteries patent to the vertebrobasilar junction. Posterior inferior cerebral arteries patent proximally. Vertebrobasilar junction normal. Basilar artery mildly tortuous but patent to its distal aspect. Superior cerebellar arteries patent bilaterally. Both of the posterior cerebral artery supplied mainly via the basilar artery and are well opacified  to their distal aspects. No significant atheromatous disease or stenosis identified within the posterior circulation. No aneurysm or vascular malformation. IMPRESSION: MRI HEAD IMPRESSION: 1. No acute intracranial infarct or other process identified. 2. Mild atrophy with chronic microvascular ischemic disease. MRA HEAD IMPRESSION: Negative intracranial MRA. No large or proximal arterial branch occlusion. No high-grade or correctable stenosis. Overall, relatively mild intracranial atherosclerotic disease for patient age. Electronically Signed   By: Rise Mu M.D.   On: 06/01/2016 22:03   Ct Head Code Stroke Wo Contrast  Result Date: 06/01/2016 CLINICAL DATA:  Code stroke. LEFT-sided numbness beginning at 1710 hours. History of carotid artery disease, hyperlipidemia. EXAM: CT HEAD WITHOUT CONTRAST TECHNIQUE: Contiguous axial images were obtained from the base of the skull through the vertex without intravenous contrast. COMPARISON:  MRI head March 16, 2016. FINDINGS: BRAIN: The ventricles and sulci are normal for age. No intraparenchymal hemorrhage, mass effect nor midline shift. Patchy supratentorial white matter hypodensities with than expected for patient's age, though non-specific are most compatible with chronic small vessel ischemic disease. No acute large vascular territory infarcts. No abnormal extra-axial fluid collections. Basal cisterns are patent. VASCULAR: Mild calcific atherosclerosis of the carotid siphons. SKULL: No skull fracture. No significant scalp soft tissue swelling. SINUSES/ORBITS: The mastoid air-cells and included paranasal sinuses are well-aerated.The included ocular globes and orbital contents are non-suspicious. OTHER: None. ASPECTS [facility] Stroke Program Early CT Score) - Ganglionic level infarction (caudate, lentiform nuclei, internal capsule, insula, M1-M3 cortex): 7 - Supraganglionic infarction (M4-M6 cortex): 3 Total score (0-10 with 10 being normal): 10 IMPRESSION:  1. No acute intracranial process ; negative CT HEAD for age. 2. ASPECTS is 10. Critical Value/emergent results were called by telephone at the time of interpretation on 06/01/2016  at 6:31 pm to Dr. Dione Booze , who verbally acknowledged these results. Electronically Signed   By: Awilda Metro M.D.   On: 06/01/2016 18:32        Scheduled Meds: . aspirin  300 mg Rectal Daily   Or  . aspirin  325 mg Oral Daily  . calcium carbonate  1 tablet Oral Daily  . calcium-vitamin D  1 tablet Oral QPM  . enoxaparin (LOVENOX) injection  40 mg Subcutaneous Q24H  . multivitamin with minerals  1 tablet Oral Daily  . pantoprazole  40 mg Oral Q1200   Continuous Infusions: . sodium chloride 75 mL/hr at 06/02/16 0344     LOS: 0 days    Time spent:    Zannie Cove, MD [facility] Pager [phone]  If 7PM-7AM, please contact night-coverage [email or web address] Password [email or web address] 06/02/2016, 1:08 PM

## 2016-06-02 NOTE — Evaluation (Signed)
Occupational Therapy Evaluation Patient Details Name: [name] MRN: [id number] DOB: 1940-08-03 Today's Date: 06/02/2016    History of Present Illness [name] is an 75 y.o. female history of TIA, peripheral vascular disease including carotid disease, hyperlipidemia, depression and anxiety, presented to the ED at [facility] following acute onset of left-sided weakness and numbness involving face arm and leg, as well as speech difficulty. Onset was at 5:30 PM today. She's been on antiplatelet therapy with aspirin daily. Symptoms began to improved after arriving in the ED and subsequently resolved. CT scan of her head showed no acute intracranial abnormality. MRI of the brain showed no acute stroke.    Clinical Impression   Pt reports she was independent with ADL PTA. Currently pt overall min guard for functional mobility without use of AD and supervision for ADL. Pt with LOB x1 during functional mobility but able to self correct without physical assist. Educated pt on home safety and fall prevention strategies. Pt planning to d/c home with intermittent family supervision. No further acute OT needs identified; signing off at this time. Please re-consult if needs change. Thank you for this referral.    Follow Up Recommendations  No OT follow up;Supervision - Intermittent    Equipment Recommendations  None recommended by OT    Recommendations for Other Services       Precautions / Restrictions Precautions Precautions: Fall Precaution Comments: pt reports that she has had several falls out of bed in the night. And one month ago she had a fall off a curb into the street with a hit to her head. Does not know why she fell Restrictions Weight Bearing Restrictions: No      Mobility Bed Mobility Overal bed mobility: Modified Independent                Transfers Overall transfer level: Needs assistance Equipment used: None Transfers: Sit to/from Stand Sit to Stand:  Supervision         General transfer comment: Supervision for safety; no physical assist required. No unsteadiness with initial sit to stand.    Balance Overall balance assessment: Needs assistance Sitting-balance support: Feet supported;No upper extremity supported Sitting balance-Leahy Scale: Normal     Standing balance support: No upper extremity supported;During functional activity Standing balance-Leahy Scale: Good Standing balance comment: pt able to stand to comb hair without support but when she begins to move, loses balance without stable surface to hold                        ADL Overall ADL's : Needs assistance/impaired Eating/Feeding: Independent;Sitting   Grooming: Supervision/safety;Wash/dry hands;Standing   Upper Body Bathing: Set up;Sitting   Lower Body Bathing: Supervison/ safety;Sit to/from stand   Upper Body Dressing : Set up;Sitting   Lower Body Dressing: Supervision/safety;Sit to/from stand   Toilet Transfer: Min guard;Ambulation;Regular Toilet   Toileting- Architect and Hygiene: Supervision/safety;Sit to/from stand   Tub/ Shower Transfer: Tub transfer;Min guard;Ambulation;Shower seat   Functional mobility during ADLs: Min guard General ADL Comments: Educated pt on home safety and fall prevention strategies; pt verbalized understanding. Pt with LOB x1 during functional mobility but able to self correct without physical assist.     Vision Vision Assessment?: No apparent visual deficits   Perception     Praxis      Pertinent Vitals/Pain Pain Assessment: No/denies pain Faces Pain Scale: Hurts little more Pain Location: left frontal region of head Pain Descriptors / Indicators:  Stabbing Pain Intervention(s): Limited activity within patient's tolerance     Hand Dominance Right   Extremity/Trunk Assessment Upper Extremity Assessment Upper Extremity Assessment: Overall WFL for tasks assessed   Lower Extremity  Assessment Lower Extremity Assessment: Defer to PT evaluation    Cervical / Trunk Assessment Cervical / Trunk Assessment: Kyphotic   Communication Communication Communication: No difficulties   Cognition Arousal/Alertness: Awake/alert Behavior During Therapy: WFL for tasks assessed/performed Overall Cognitive Status: Within Functional Limits for tasks assessed                     General Comments       Exercises       Shoulder Instructions      Home Living Family/patient expects to be discharged to:: Private residence Living Arrangements: Spouse/significant other Available Help at Discharge: Family;Available PRN/intermittently Type of Home: House Home Access: Stairs to enter [employer] of Steps: 3 Entrance Stairs-Rails: Can reach both Home Layout: One level     Bathroom Shower/Tub: Tub/shower unit Shower/tub characteristics: Door Firefighter: Standard     Home Equipment: Emergency planning/management officer - 2 wheels   Additional Comments: pt cares for her husband with Alzheimers. 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        OT Problem List:     OT Treatment/Interventions:      OT Goals(Current goals can be found in the care plan section) Acute Rehab OT Goals Patient Stated Goal: be able to care for husband OT Goal Formulation: All assessment and education complete, DC therapy  OT Frequency:     Barriers to D/C:            Co-evaluation              End of Session Equipment Utilized During Treatment: Gait belt Nurse Communication: Mobility status  Activity Tolerance: Patient tolerated treatment well Patient left: in bed;with call bell/phone within reach   Time: 1353-1414 OT Time Calculation (min): 21 min Charges:  OT General  Charges $OT Visit: 1 Procedure OT Evaluation $OT Eval Moderate Complexity: 1 Procedure G-Codes: OT G-codes **NOT FOR INPATIENT CLASS** Functional Assessment Tool Used: Clinical judgement Functional Limitation: Self care Self Care Current Status (L8756): At least 1 percent but less than 20 percent impaired, limited or restricted Self Care Goal Status (E3329): At least 1 percent but less than 20 percent impaired, limited or restricted Self Care Discharge Status [phone]): At least 1 percent but less than 20 percent impaired, limited or restricted   Gaye Alken M.S., OTR/L Pager: [phone]  06/02/2016, 2:24 PM

## 2016-06-02 NOTE — Evaluation (Signed)
Speech Language Pathology Evaluation Patient Details Name: [name] MRN: [id number] DOB: [birth year] Today's Date: 06/02/2016 Time: 3154-0086 SLP Time Calculation (min) (ACUTE ONLY): 19 min  Problem List:  Patient Active Problem List   Diagnosis Date Noted  . TIA (transient ischemic attack) 06/01/2016  . CVA (cerebral vascular accident) (HCC) 06/01/2016  . Transient cerebral ischemia   . Complicated migraine 08/22/2014  . Multinodular goiter 08/09/2011  . Lumbar pain with radiation down right leg 04/11/2011  . Rash 04/11/2011  . Impaired glucose tolerance 01/14/2011  . Preventative health care 01/14/2011  . WRIST PAIN, BILATERAL 05/25/2010  . TIA 11/22/2009  . LOW BACK PAIN, CHRONIC 11/22/2009  . ANXIETY 10/03/2009  . DEPRESSION 10/03/2009  . PERIPHERAL NEUROPATHY 10/03/2009  . BARRETTS ESOPHAGUS 10/03/2009  . CONSTIPATION, CHRONIC 10/03/2009  . URETHRAL STRICTURE 10/03/2009  . POSITIVE PPD 10/03/2009  . VOCAL CORD POLYP, HX OF 10/03/2009  . Carotid artery disease (HCC) 08/31/2009  . Hyperlipemia 08/29/2009  . PERIPHERAL VASCULAR DISEASE 08/29/2009  . GERD 08/29/2009  . DISC DISEASE, CERVICAL 08/29/2009  . DISC DISEASE, LUMBAR 08/29/2009  . LEG PAIN, BILATERAL 08/29/2009  . TRANSIENT ISCHEMIC ATTACK, HX OF 08/29/2009  . COLONIC POLYPS, HX OF 08/29/2009   Past Medical History:  Past Medical History:  Diagnosis Date  . Acute sinus infection 01/15/2011  . ANXIETY 10/03/2009  . BARRETTS ESOPHAGUS 10/03/2009  . CAROTID ARTERY DISEASE 08/31/2009  . COLONIC POLYPS, HX OF 08/29/2009  . CONSTIPATION, CHRONIC 10/03/2009  . DEPRESSION 10/03/2009  . DISC DISEASE, CERVICAL 08/29/2009  . DISC DISEASE, LUMBAR 08/29/2009  . Eustachian tube dysfunction 01/15/2011  . GERD 08/29/2009  . HYPERLIPIDEMIA 08/29/2009  . Impaired glucose tolerance 01/14/2011  . LEG PAIN, BILATERAL 08/29/2009  . LOW BACK PAIN, CHRONIC 11/22/2009  . PERIPHERAL NEUROPATHY 10/03/2009  . PERIPHERAL VASCULAR DISEASE  08/29/2009  . POSITIVE PPD 10/03/2009  . Preventative health care 01/14/2011  . Thyroid nodule 08/09/2011  . TIA 11/22/2009  . TRANSIENT ISCHEMIC ATTACK, HX OF 08/29/2009  . URETHRAL STRICTURE 10/03/2009  . VOCAL CORD POLYP, HX OF 10/03/2009  . WRIST PAIN, BILATERAL 05/25/2010   Past Surgical History:  Past Surgical History:  Procedure Laterality Date  . hx of right ankle fracture    . s/p vocal cord polyps  1984   chronic hoarseness  . stress test negative  2003,2004,2006  . TUBAL LIGATION    . uterine prolapse     HPI:  Sonya Canchola Harrisonis a 75 y.o.femalewith medical history significant of hyperlipidemia, GERD, depression, anxiety, TIA, PVD, carotid artery stenosis, chronic back pain, who presents with left-sided weakness, numbness, slurred speech, left facial droop. CT and MRI of brain without acute intracranial abnormalities.   Assessment / Plan / Recommendation Clinical Impression  Pt presents with mild dysarthria of speech. Per MD notes pt with left facial droop and slurred speech upon ED arrival, which have improved greatly. Pt with mild deficits of speech including decreased articulation of sounds however this does not impede intelligibility of communication. Suspect mild deficits to completely resolve. Current neurological imaging negative for acute intraranial abnormalities. Pts cognitive abilities appear intact. No ST needs identified.     SLP Assessment  Patient does not need any further Speech Lanaguage Pathology Services    Follow Up Recommendations    None   Frequency and Duration           SLP Evaluation Cognition  Overall Cognitive Status: Within Functional Limits for tasks assessed Arousal/Alertness: Awake/alert Orientation Level: Oriented X4  Comprehension  Auditory Comprehension Overall Auditory Comprehension: Appears within functional limits for tasks assessed Visual Recognition/Discrimination Discrimination: Within Function Limits    Expression  Expression Primary Mode of Expression: Verbal Verbal Expression Overall Verbal Expression: Appears within functional limits for tasks assessed Written Expression Dominant Hand: Right   Oral / Motor  Oral Motor/Sensory Function Overall Oral Motor/Sensory Function: Mild impairment Facial Symmetry: Abnormal symmetry left Facial Sensation: Within Functional Limits Lingual ROM: Within Functional Limits Lingual Symmetry: Within Functional Limits Lingual Strength: Within Functional Limits Lingual Sensation: Within Functional Limits Motor Speech Overall Motor Speech: Impaired Respiration: Within functional limits Phonation: Low vocal intensity;Hoarse Resonance: Within functional limits Articulation: Impaired Intelligibility: Intelligible Motor Planning: Witnin functional limits   GO          Functional Assessment Tool Used: skilled observation Functional Limitations: Motor speech Motor Speech Current Status [phone]): At least 1 percent but less than 20 percent impaired, limited or restricted Motor Speech Goal Status [phone]): At least 1 percent but less than 20 percent impaired, limited or restricted Motor Speech Goal Status [phone]): At least 1 percent but less than 20 percent impaired, limited or restricted         Marcene Duos MA, CCC-SLP Acute Care Speech Language Pathologist    Kennieth Rad 06/02/2016, 2:46 PM

## 2016-06-02 NOTE — Evaluation (Signed)
Physical Therapy Evaluation Patient Details Name: [name] MRN: [id number] DOB: 10-02-1940 Today's Date: 06/02/2016   History of Present Illness  [name] is an 75 y.o. female history of TIA, peripheral vascular disease including carotid disease, hyperlipidemia, depression and anxiety, presented to the ED at [facility] following acute onset of left-sided weakness and numbness involving face arm and leg, as well as speech difficulty. Onset was at 5:30 PM today. She's been on antiplatelet therapy with aspirin daily. Symptoms began to improved after arriving in the ED and subsequently resolved. CT scan of her head showed no acute intracranial abnormality. MRI of the brain showed no acute stroke.   Clinical Impression  Pt admitted with above diagnosis. Pt currently with functional limitations due to the deficits listed below (see PT Problem List). Pt unsteady with ambulation, she did have some unsteadiness PTA since falling a month ago, though today, she did not feel safe to ambulate without RW and without it, needed min A from therapist to prevent fall. Scored 40/56 on Berg balance indicating >80% fall risk.  Pt will benefit from skilled PT to increase their independence and safety with mobility to allow discharge to the venue listed below.       Follow Up Recommendations Outpatient PT    Equipment Recommendations  None recommended by PT    Recommendations for Other Services       Precautions / Restrictions Precautions Precautions: Fall Precaution Comments: pt reports that she has had several falls out of bed in the night. And one month ago she had a fall off a curb into the street with a hit to her head. Does not know why she fell Restrictions Weight Bearing Restrictions: No      Mobility  Bed Mobility Overal bed mobility: Independent                Transfers Overall transfer level: Needs assistance Equipment used: None Transfers: Sit to/from Stand Sit to  Stand: Supervision         General transfer comment: pt slightly unsteady with initial standing but sat down from and rose from toilet with no difficulty  Ambulation/Gait Ambulation/Gait assistance: Min assist;Supervision Ambulation Distance (Feet): 400 Feet Assistive device: Rolling walker (2 wheeled);None Gait Pattern/deviations: Step-through pattern;Staggering right Gait velocity: decreased Gait velocity interpretation: Below normal speed for age/gender General Gait Details: initially pt ambulated without RW and required min A and staggered to right several times. Pt and therapist agreed she should try RW, supervision with RW and no LOB. After 300', pt again ambulated without RW for 50' and was still unsteady with min A for safety.   Stairs            Wheelchair Mobility    Modified Rankin (Stroke Patients Only) Modified Rankin (Stroke Patients Only) Pre-Morbid Rankin Score: No significant disability Modified Rankin: Moderately severe disability     Balance Overall balance assessment: Needs assistance;History of Falls Sitting-balance support: No upper extremity supported Sitting balance-Leahy Scale: Normal     Standing balance support: No upper extremity supported Standing balance-Leahy Scale: Fair Standing balance comment: pt able to stand to comb hair without support but when she begins to move, loses balance without stable surface to hold                 Standardized Balance Assessment Standardized Balance Assessment : Berg Balance Test Berg Balance Test Sit to Stand: Able to stand without using hands and stabilize independently Standing Unsupported: Able to stand safely  2 minutes Sitting with Back Unsupported but Feet Supported on Floor or Stool: Able to sit safely and securely 2 minutes Stand to Sit: Sits safely with minimal use of hands Transfers: Able to transfer safely, minor use of hands Standing Unsupported with Eyes Closed: Able to stand 10  seconds safely Standing Ubsupported with Feet Together: Able to place feet together independently and stand 1 minute safely From Standing, Reach Forward with Outstretched Arm: Reaches forward but needs supervision From Standing Position, Pick up Object from Floor: Able to pick up shoe safely and easily From Standing Position, Turn to Look Behind Over each Shoulder: Turn sideways only but maintains balance Turn 360 Degrees: Needs close supervision or verbal cueing Standing Unsupported, Alternately Place Feet on Step/Stool: Able to complete 4 steps without aid or supervision Standing Unsupported, One Foot in Front: Needs help to step but can hold 15 seconds Standing on One Leg: Tries to lift leg/unable to hold 3 seconds but remains standing independently Total Score: 40         Pertinent Vitals/Pain Pain Assessment: Faces Faces Pain Scale: Hurts little more Pain Location: left frontal region of head Pain Descriptors / Indicators: Stabbing Pain Intervention(s): Limited activity within patient's tolerance         BP 128/87  Home Living Family/patient expects to be discharged to:: Private residence Living Arrangements: Spouse/significant other Available Help at Discharge: Family;Available PRN/intermittently Type of Home: House Home Access: Stairs to enter Entrance Stairs-Rails: Can reach both Entrance Stairs-Number of Steps: 3 Home Layout: One level Home Equipment: Bedside commode;Walker - 2 wheels Additional Comments: pt cares for her husband with Alzheimers. 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     Hand Dominance   Dominant Hand: Right    Extremity/Trunk Assessment   Upper Extremity Assessment: Defer to OT evaluation           Lower Extremity Assessment: LLE deficits/detail   LLE  Deficits / Details: left hip flexion slightly weaker than right 4+/5, knee felx/ ext 5/5, ankle 5/5  Cervical / Trunk Assessment: Kyphotic  Communication   Communication: No difficulties  Cognition Arousal/Alertness: Awake/alert Behavior During Therapy: WFL for tasks assessed/performed Overall Cognitive Status: Within Functional Limits for tasks assessed                      General Comments General comments (skin integrity, edema, etc.): of note, pt reports that since she fell a month ago, she has been more tired than normal and has been consistently unsteady    Exercises     Assessment/Plan    PT Assessment Patient needs continued PT services  PT Problem List Decreased strength;Decreased activity tolerance;Decreased balance;Decreased mobility;Decreased knowledge of use of DME;Decreased knowledge of precautions;Pain          PT Treatment Interventions DME instruction;Gait training;Stair training;Functional mobility training;Therapeutic activities;Therapeutic exercise;Balance training;Patient/family education    PT Goals (Current goals can be found in the Care Plan section)  Acute Rehab PT Goals Patient Stated Goal: be able to care for husband PT Goal Formulation: With patient Time For Goal Achievement: 06/16/16 Potential to Achieve Goals: Good Additional Goals Additional Goal #1: pt to score >19 on DGI    Frequency Min 4X/week   Barriers  to discharge Decreased caregiver support cares for husband at home    Co-evaluation               End of Session Equipment Utilized During Treatment: Gait belt Activity Tolerance: Patient tolerated treatment well Patient left: in chair;with call bell/phone within reach;with family/visitor present Nurse Communication: Mobility status    Functional Assessment Tool Used: clinical judgement Functional Limitation: Mobility: Walking and moving around Mobility: Walking and Moving Around Current Status (U9811): At least 1  percent but less than 20 percent impaired, limited or restricted Mobility: Walking and Moving Around Goal Status [phone]): 0 percent impaired, limited or restricted    Time: 0850-0938 PT Time Calculation (min) (ACUTE ONLY): 48 min   Charges:   PT Evaluation $PT Eval Moderate Complexity: 1 Procedure PT Treatments $Gait Training: 23-37 mins   PT G Codes:   PT G-Codes **NOT FOR INPATIENT CLASS** Functional Assessment Tool Used: clinical judgement Functional Limitation: Mobility: Walking and moving around Mobility: Walking and Moving Around Current Status (G9562): At least 1 percent but less than 20 percent impaired, limited or restricted Mobility: Walking and Moving Around Goal Status [phone]): 0 percent impaired, limited or restricted  Lyanne Co, PT  Acute Rehab Services  [phone]   Lyanne Co 06/02/2016, 10:39 AM

## 2016-06-02 NOTE — Progress Notes (Signed)
STROKE TEAM PROGRESS NOTE   HISTORY OF PRESENT ILLNESS (per record) [name] is an 75 y.o. female history of TIA, peripheral vascular disease including carotid disease, hyperlipidemia, depression and anxiety, [facility] following acute onset of left-sided weakness and numbness involving face arm and leg, as well as speech difficulty. Onset was at 5:30 PM today. She's been on antiplatelet therapy with aspirin daily. Symptoms began to improved after arriving in the ED and subsequently resolved. CT scan of her head showed no acute intracranial abnormality. MRI of the brain showed no acute stroke. Chronic small vessel ischemic changes were noted. MRA showed no large vessel occlusion nor significant stenosis. NIH stroke score at the time of this evaluation was 0.  LSN: 5:30 PM on 06/01/2016 tPA Given: No: Deficits rapidly resolved mRankin:   SUBJECTIVE (INTERVAL HISTORY) Her friend was at the bedside.  Overall she feels her condition is improved.  Her only complaint was headache   OBJECTIVE Temp:  [97.7 F (36.5 C)-98.5 F (36.9 C)] 97.9 F (36.6 C) (11/04 0704) Pulse Rate:  [53-66] 57 (11/04 0704) Cardiac Rhythm: Sinus bradycardia (11/04 0700) Resp:  [13-20] 20 (11/04 0704) BP: (84-175)/(47-97) 108/69 (11/04 0704) SpO2:  [99 %-100 %] 100 % (11/04 0704) Weight:  [70.8 kg (156 lb)-72.1 kg (159 lb)] 72.1 kg (159 lb) (11/03 2251)  CBC:   Recent Labs Lab 06/01/16 1827  WBC 4.4  NEUTROABS 1.6*  HGB 14.7  HCT 44.8  MCV 96.6  PLT 174    Basic Metabolic Panel:   Recent Labs Lab 06/01/16 1827  NA 142  K 3.9  CL 106  CO2 29  GLUCOSE 109*  BUN 10  CREATININE 0.80  CALCIUM 9.9  MG 2.1    Lipid Panel:     Component Value Date/Time   CHOL 235 (H) 08/22/2014 0409   TRIG 85 08/22/2014 0409   HDL 42 08/22/2014 0409   CHOLHDL 5.6 08/22/2014 0409   VLDL 17 08/22/2014 0409   LDLCALC 176 (H) 08/22/2014 0409   HgbA1c:  Lab Results  Component  Value Date   HGBA1C 5.9 (H) 08/22/2014   Urine Drug Screen:     Component Value Date/Time   LABOPIA NONE DETECTED 06/01/2016 1827   COCAINSCRNUR NONE DETECTED 06/01/2016 1827   LABBENZ NONE DETECTED 06/01/2016 1827   AMPHETMU NONE DETECTED 06/01/2016 1827   THCU NONE DETECTED 06/01/2016 1827   LABBARB NONE DETECTED 06/01/2016 1827      IMAGING  Mr Sonya GlennMra Head/brain Wo Cm 06/01/2016 MRI HEAD  1. No acute intracranial infarct or other process identified.  2. Mild atrophy with chronic microvascular ischemic disease.   MRA HEAD  Negative intracranial MRA. No large or proximal arterial branch occlusion. No high-grade or correctable stenosis. Overall, relatively mild intracranial atherosclerotic disease for patient age.    Ct Head Code Stroke Wo Contrast 06/01/2016 1. No acute intracranial process ; negative CT HEAD for age.  2. ASPECTS is 10.     PHYSICAL EXAM HEENT-  Normocephalic, no lesions, without obvious abnormality.  Normal external eye and conjunctiva.  Normal TM's bilaterally.  Normal auditory canals and external ears. Normal external nose, mucus membranes and septum.  Normal pharynx. Neck supple with no masses, nodes, nodules or enlargement. Cardiovascular - regular rate and rhythm, S1, S2 normal, no murmur, click, rub or gallop Lungs - chest clear, no wheezing, rales, normal symmetric air entry Abdomen - soft, non-tender; bowel sounds normal; no masses,  no organomegaly Extremities -  no joint deformities, effusion, or inflammation  Neurologic Examination: Mental Status: Alert, oriented, thought content appropriate.  Speech fluent without evidence of aphasia. Able to follow commands without difficulty. Cranial Nerves: II-Visual fields were normal. III/IV/VI-Pupils were equal and reacted normally to light. Extraocular movements were full and conjugate.    V/VII-no facial numbness and no facial weakness. VIII-normal. X-normal speech and symmetrical palatal  movement. XI: trapezius strength/neck flexion strength normal bilaterally XII-midline tongue extension with normal strength. Motor: 5/5 bilaterally with normal tone and bulk Sensory: Normal throughout. Deep Tendon Reflexes: 1+ and symmetric. Plantars: Flexor bilaterally Cerebellar: Normal finger-to-nose testing. Carotid auscultation: Normal    ASSESSMENT/PLAN [name] is a 75 y.o. female with history of TIAs, peripheral vascular disease, peripheral neuropathy, hyperlipidemia, carotid artery disease, anxiety and depression presenting with speech difficulties, and left-sided weakness/numbness involving the face arm and leg. She did not receive IV t-PA due to resolution of deficits.  TIA:  Non-dominant   Resultant - resolution of deficits with  MRI - no acute intracranial infarct or other process identified.   MRA - negative  Carotid Doppler - pending  2D Echo - pending  LDL - pending  HgbA1c - pending  VTE prophylaxis - Lovenox Diet Heart Room service appropriate? Yes; Fluid consistency: Thin  aspirin 81 mg daily prior to admission, now on aspirin 325 mg daily  Patient counseled to be compliant with her antithrombotic medications  Ongoing aggressive stroke risk factor management  Therapy recommendations: Outpatient physical therapy recommended  Disposition:  Pending  Hypertension  Blood pressure tends to run low  Permissive hypertension (OK if < 220/120) but gradually normalize in 5-7 days  Long-term BP goal normotensive  Hyperlipidemia  Home meds:  Crestor 20 mg daily prior to admission; however, patient was not taking medication.  LDL pending, goal < 70  Continue statin at discharge  Other Stroke Risk Factors  Advanced age  Hx stroke/TIA  Family hx stroke (mother)   Other Active Problems  Medical noncompliance  Hospital day # 0  ATTENDING NOTE: Patient was seen and examined by me personally. Documentation reflects findings. The  laboratory and radiographic studies reviewed by me. ROS completed by me personally and pertinent positives fully documented  Condition: Stable  Assessment and plan completed by me personally and fully documented above. Plans/Recommendations include:     Stroke workup ongoing  Patient reports recently receiving Tylenol for headache  Encourage statin complinace  Will follow  SIGNED BY: Dr. Sula Sodahere Charbel Los    To contact Stroke Continuity provider, please refer to [email or web address]. After hours, contact General Neurology

## 2016-06-03 ENCOUNTER — Observation Stay (HOSPITAL_BASED_OUTPATIENT_CLINIC_OR_DEPARTMENT_OTHER): Payer: Commercial Managed Care - HMO

## 2016-06-03 DIAGNOSIS — I6789 Other cerebrovascular disease: Secondary | ICD-10-CM

## 2016-06-03 DIAGNOSIS — G459 Transient cerebral ischemic attack, unspecified: Secondary | ICD-10-CM | POA: Diagnosis not present

## 2016-06-03 DIAGNOSIS — G8194 Hemiplegia, unspecified affecting left nondominant side: Secondary | ICD-10-CM

## 2016-06-03 DIAGNOSIS — I639 Cerebral infarction, unspecified: Secondary | ICD-10-CM | POA: Diagnosis not present

## 2016-06-03 LAB — ECHOCARDIOGRAM COMPLETE
Height: 66 in
WEIGHTICAEL: 2544 [oz_av]

## 2016-06-03 MED ORDER — ASPIRIN 325 MG PO TABS: 325.0000 mg | ORAL_TABLET | Freq: Every day | ORAL | Status: DC

## 2016-06-03 NOTE — Discharge Instructions (Signed)
Aspirin and Your Heart  Aspirin is a medicine that affects the way blood clots. Aspirin can be used to help reduce the risk of blood clots, heart attacks, and other heart-related problems.  SHOULD I TAKE ASPIRIN? Your health care provider will help you determine whether it is safe and beneficial for you to take aspirin daily. Taking aspirin daily may be beneficial if you:  Have had a heart attack or chest pain.  Have undergone open heart surgery such as coronary artery bypass surgery (CABG).  Have had coronary angioplasty.  Have experienced a stroke or transient ischemic attack (TIA).  Have peripheral vascular disease (PVD).  Have chronic heart rhythm problems such as atrial fibrillation. ARE THERE ANY RISKS OF TAKING ASPIRIN DAILY? Daily use of aspirin can increase your risk of side effects. Some of these include:  Bleeding. Bleeding problems can be minor or serious. An example of a minor problem is a cut that does not stop bleeding. An example of a more serious problem is stomach bleeding or bleeding into the brain. Your risk of bleeding is increased if you are also taking non-steroidal anti-inflammatory medicine (NSAIDs).  Increased bruising.  Upset stomach.  An allergic reaction. People who have nasal polyps have an increased risk of developing an aspirin allergy. WHAT ARE SOME GUIDELINES I SHOULD FOLLOW WHEN TAKING ASPIRIN?   Take aspirin only as directed by your health care provider. Make sure you understand how much you should take and what form you should take. The two forms of aspirin are:  Non-enteric-coated. This type of aspirin does not have a coating and is absorbed quickly. Non-enteric-coated aspirin is usually recommended for people with chest pain. This type of aspirin also comes in a chewable form.  Enteric-coated. This type of aspirin has a special coating that releases the medicine very slowly. Enteric-coated aspirin causes less stomach upset than non-enteric-coated  aspirin. This type of aspirin should not be chewed or crushed.  Drink alcohol in moderation. Drinking alcohol increases your risk of bleeding. WHEN SHOULD I SEEK MEDICAL CARE?   You have unusual bleeding or bruising.  You have stomach pain.  You have an allergic reaction. Symptoms of an allergic reaction include:  Hives.  Itchy skin.  Swelling of the lips, tongue, or face.  You have ringing in your ears. WHEN SHOULD I SEEK IMMEDIATE MEDICAL CARE?   Your bowel movements are bloody, dark red, or black in color.  You vomit or cough up blood.  You have blood in your urine.  You cough, wheeze, or feel short of breath. If you have any of the following symptoms, this is an emergency. Do not wait to see if the pain will go away. Get medical help at once. Call your local emergency services (911 in the [place] Do not drive yourself to the hospital.  You have severe chest pain, especially if the pain is crushing or pressure-like and spreads to the arms, back, neck, or jaw.  You have stroke-like symptoms, such as:   Loss of vision.   Difficulty talking.   Numbness or weakness on one side of your body.   Numbness or weakness in your arm or leg.   Not thinking clearly or feeling confused.    This information is not intended to replace advice given to you by your health care provider. Make sure you discuss any questions you have with your health care provider.   Document Released: 06/28/2008 Document Revised: 08/06/2014 Document Reviewed: 10/21/2013 Elsevier Interactive Patient Education 2016 [employer]   Inc[employer]  

## 2016-06-03 NOTE — Progress Notes (Signed)
*  PRELIMINARY RESULTS* Echocardiogram 2D Echocardiogram has been performed.  Jeryl Columbialliott, Zamier Eggebrecht 06/03/2016, 11:04 AM

## 2016-06-03 NOTE — Discharge Summary (Signed)
Physician Discharge Summary  [name] EAV:[id number] DOB: 1941/06/11 DOA: 06/01/2016  PCP: Gwynneth Aliment, MD  Admit date: 06/01/2016 Discharge date: 06/03/2016  Time spent: 45 minutes  Recommendations for Outpatient Follow-up:  1. Neurology Dr.Sethi in 1 month, please consider changing ASA to plavix on follow up 2. PCP Dr.Sanders in 1 week, please FU LDL and Hba1c  Discharge Diagnoses:    TIA (transient ischemic attack)   Acute left hemiparesis (HCC)   Hyperlipemia   Carotid artery disease (HCC)   GERD   LOW BACK PAIN, CHRONIC   Discharge Condition: stable  Diet recommendation: heart healthy  Filed Weights   06/01/16 1805 06/01/16 2251  Weight: 70.8 kg (156 lb) 72.1 kg (159 lb)    History of present illness:  Harrisonis an 75 y.o.femalehistory of TIA, peripheral vascular disease including carotid disease, hyperlipidemia, depression and anxiety, presented to the ED at [facility] following acute onset of left-sided weakness and numbness involving face arm and leg, as well as speech difficulty. Onset was at 5:30 PM today. She's been on antiplatelet therapy with aspirin daily  Hospital Course:   TIA -L sided weakness and L facial droop on admission, completely resolved -MRI and MRI without acute findings -started on ASA 325mg  daily, was on ASA 81mg  at home prior to admission, I discussed with Neurology Dr.Gregory regarding changing ASA to Plavix at discharge, she recommended changing ASA to 325mg  daily and having her FU with Dr.Sethi -decision about changing to Plavix to be can be made at the time of Outpatient Neurology FU -continue crestor -2D ECHO with normal EF and wall motion and carotid duplex with 1-39% stenosis -LDL-pending at discharge, needs FU, continue home dose of crestor -Pt/OT/SLP consulted, Outpatient Pt FU recommended  HLD: -allergic to statin, continue Crestor. -LDL pending at discharge  H/o Carotid artery disease (HCC): -on ASA -Carotid  Doppler with 1-39% stenosis, improved compared to 40-59% stenosis from 07/2014  GERD: -Protonix  Chronic back pain: -prn tylenol, stable  Consultations:  Neurology Dr.Gregory  Discharge Exam: Vitals:   06/03/16 0551 06/03/16 0856  BP: (!) 113/58 (!) 103/52  Pulse: (!) 59 62  Resp: 16 18  Temp: 98.6 F (37 C) 98.7 F (37.1 C)    General: AAOx3 Cardiovascular: S1S2/RRR Respiratory: CTAB  Discharge Instructions   Discharge Instructions    Diet - low sodium heart healthy    Complete by:  As directed    Increase activity slowly    Complete by:  As directed      Current Discharge Medication List    START taking these medications   Details  aspirin 325 MG tablet Take 1 tablet (325 mg total) by mouth daily.      CONTINUE these medications which have NOT CHANGED   Details  Calcium Carbonate-Vitamin D (CALCIUM-VITAMIN D) 500-200 MG-UNIT tablet Take 1 tablet by mouth every evening.     dextromethorphan-guaiFENesin (MUCINEX DM) 30-600 MG 12hr tablet Take 1 tablet by mouth 2 (two) times daily as needed for cough.    Multiple Vitamins-Minerals (MULTIVITAMIN WITH MINERALS) tablet Take 1 tablet by mouth daily.    Naproxen Sodium (ALEVE) 220 MG CAPS Take 2 capsules by mouth daily as needed (pain).     traZODone (DESYREL) 50 MG tablet Take 25 mg by mouth at bedtime as needed for sleep (pt will get nightmares if she takes 50mg ).     CRESTOR 20 MG tablet TAKE ONE TABLET BY MOUTH EVERY DAY Qty: 90 each, Refills: 3  STOP taking these medications     aspirin EC 81 MG tablet      CALCIUM PO      triamcinolone (KENALOG) 0.1 % paste        Allergies  Allergen Reactions  . Atorvastatin     REACTION: myalgia  . Penicillins Hives and Itching    Has patient had a PCN reaction causing immediate rash, facial/tongue/throat swelling, SOB or lightheadedness with hypotension: no Has patient had a PCN reaction causing severe rash involving mucus membranes or skin necrosis:  no Has patient had a PCN reaction that required hospitalization : no Has patient had a PCN reaction occurring within the last 10 years: no If all of the above answers are "NO", then may proceed with Cephalosporin use.    Follow-up Information    SETHI,PRAMOD, MD. Schedule an appointment as soon as possible for a visit in 1 month(s).   Specialties:  Neurology, Radiology Contact information: [address] [place] [address] [phone]            The results of significant diagnostics from this hospitalization (including imaging, microbiology, ancillary and laboratory) are listed below for reference.    Significant Diagnostic Studies: Mr Brain Wo Contrast  Result Date: 06/01/2016 CLINICAL DATA:  Initial evaluation for acute slurred speech, left-sided weakness, numbness, left-sided facial droop. EXAM: MRI HEAD WITHOUT CONTRAST MRA HEAD WITHOUT CONTRAST TECHNIQUE: Multiplanar, multiecho pulse sequences of the brain and surrounding structures were obtained without intravenous contrast. Angiographic images of the head were obtained using MRA technique without contrast. COMPARISON:  Prior CT from earlier the same day as well as previous MRI from 03/16/2016. FINDINGS: MRI HEAD FINDINGS Brain: Mild generalized cerebral atrophy with chronic microvascular ischemic disease. No abnormal foci of restricted diffusion to suggest acute or subacute ischemia. Gray-white matter differentiation maintained. No evidence for acute or chronic intracranial hemorrhage. No areas of chronic infarction. No mass lesion, midline shift, or mass effect. No hydrocephalus. No extra-axial fluid collection. Major dural sinuses are patent. Pituitary gland normal. Vascular: Major intracranial vascular flow voids are maintained. Skull and upper cervical spine: Craniocervical junction normal. Visualized upper cervical spine unremarkable. Bone marrow signal intensity normal. No scalp soft tissue abnormality.  Sinuses/Orbits: Globes and orbital soft tissues within normal limits. Mild scattered mucosal thickening within the ethmoidal air cells. Paranasal sinuses are otherwise clear. No mastoid effusion. Inner ear structures normal. MRA HEAD FINDINGS ANTERIOR CIRCULATION: Distal cervical segments of the internal carotid arteries are patent with antegrade flow. Distal cervical right ICA mildly tortuous. Petrous, cavernous, and supraclinoid segments widely patent without flow-limiting stenosis. A1 segments patent. Anterior communicating artery normal. Anterior cerebral arteries patent to their distal aspects. M1 segments patent without stenosis or occlusion. MCA bifurcations normal. Distal MCA branches well opacified and symmetric.No significant atheromatous disease or stenosis identified within the anterior circulation. POSTERIOR CIRCULATION: Vertebral arteries patent to the vertebrobasilar junction. Posterior inferior cerebral arteries patent proximally. Vertebrobasilar junction normal. Basilar artery mildly tortuous but patent to its distal aspect. Superior cerebellar arteries patent bilaterally. Both of the posterior cerebral artery supplied mainly via the basilar artery and are well opacified to their distal aspects. No significant atheromatous disease or stenosis identified within the posterior circulation. No aneurysm or vascular malformation. IMPRESSION: MRI HEAD IMPRESSION: 1. No acute intracranial infarct or other process identified. 2. Mild atrophy with chronic microvascular ischemic disease. MRA HEAD IMPRESSION: Negative intracranial MRA. No large or proximal arterial branch occlusion. No high-grade or correctable stenosis. Overall, relatively mild intracranial atherosclerotic disease  for patient age. Electronically Signed   By: Rise Mu M.D.   On: 06/01/2016 22:03   Mr Maxine Glenn Head/brain QM Cm  Result Date: 06/01/2016 CLINICAL DATA:  Initial evaluation for acute slurred speech, left-sided weakness,  numbness, left-sided facial droop. EXAM: MRI HEAD WITHOUT CONTRAST MRA HEAD WITHOUT CONTRAST TECHNIQUE: Multiplanar, multiecho pulse sequences of the brain and surrounding structures were obtained without intravenous contrast. Angiographic images of the head were obtained using MRA technique without contrast. COMPARISON:  Prior CT from earlier the same day as well as previous MRI from 03/16/2016. FINDINGS: MRI HEAD FINDINGS Brain: Mild generalized cerebral atrophy with chronic microvascular ischemic disease. No abnormal foci of restricted diffusion to suggest acute or subacute ischemia. Gray-white matter differentiation maintained. No evidence for acute or chronic intracranial hemorrhage. No areas of chronic infarction. No mass lesion, midline shift, or mass effect. No hydrocephalus. No extra-axial fluid collection. Major dural sinuses are patent. Pituitary gland normal. Vascular: Major intracranial vascular flow voids are maintained. Skull and upper cervical spine: Craniocervical junction normal. Visualized upper cervical spine unremarkable. Bone marrow signal intensity normal. No scalp soft tissue abnormality. Sinuses/Orbits: Globes and orbital soft tissues within normal limits. Mild scattered mucosal thickening within the ethmoidal air cells. Paranasal sinuses are otherwise clear. No mastoid effusion. Inner ear structures normal. MRA HEAD FINDINGS ANTERIOR CIRCULATION: Distal cervical segments of the internal carotid arteries are patent with antegrade flow. Distal cervical right ICA mildly tortuous. Petrous, cavernous, and supraclinoid segments widely patent without flow-limiting stenosis. A1 segments patent. Anterior communicating artery normal. Anterior cerebral arteries patent to their distal aspects. M1 segments patent without stenosis or occlusion. MCA bifurcations normal. Distal MCA branches well opacified and symmetric.No significant atheromatous disease or stenosis identified within the anterior  circulation. POSTERIOR CIRCULATION: Vertebral arteries patent to the vertebrobasilar junction. Posterior inferior cerebral arteries patent proximally. Vertebrobasilar junction normal. Basilar artery mildly tortuous but patent to its distal aspect. Superior cerebellar arteries patent bilaterally. Both of the posterior cerebral artery supplied mainly via the basilar artery and are well opacified to their distal aspects. No significant atheromatous disease or stenosis identified within the posterior circulation. No aneurysm or vascular malformation. IMPRESSION: MRI HEAD IMPRESSION: 1. No acute intracranial infarct or other process identified. 2. Mild atrophy with chronic microvascular ischemic disease. MRA HEAD IMPRESSION: Negative intracranial MRA. No large or proximal arterial branch occlusion. No high-grade or correctable stenosis. Overall, relatively mild intracranial atherosclerotic disease for patient age. Electronically Signed   By: Rise Mu M.D.   On: 06/01/2016 22:03   Ct Head Code Stroke Wo Contrast  Result Date: 06/01/2016 CLINICAL DATA:  Code stroke. LEFT-sided numbness beginning at 1710 hours. History of carotid artery disease, hyperlipidemia. EXAM: CT HEAD WITHOUT CONTRAST TECHNIQUE: Contiguous axial images were obtained from the base of the skull through the vertex without intravenous contrast. COMPARISON:  MRI head March 16, 2016. FINDINGS: BRAIN: The ventricles and sulci are normal for age. No intraparenchymal hemorrhage, mass effect nor midline shift. Patchy supratentorial white matter hypodensities with than expected for patient's age, though non-specific are most compatible with chronic small vessel ischemic disease. No acute large vascular territory infarcts. No abnormal extra-axial fluid collections. Basal cisterns are patent. VASCULAR: Mild calcific atherosclerosis of the carotid siphons. SKULL: No skull fracture. No significant scalp soft tissue swelling. SINUSES/ORBITS: The  mastoid air-cells and included paranasal sinuses are well-aerated.The included ocular globes and orbital contents are non-suspicious. OTHER: None. ASPECTS [facility] Stroke Program Early CT Score) - Ganglionic level infarction (caudate, lentiform nuclei, internal capsule,  insula, M1-M3 cortex): 7 - Supraganglionic infarction (M4-M6 cortex): 3 Total score (0-10 with 10 being normal): 10 IMPRESSION: 1. No acute intracranial process ; negative CT HEAD for age. 2. ASPECTS is 10. Critical Value/emergent results were called by telephone at the time of interpretation on 06/01/2016 at 6:31 pm to Dr. Dione Booze , who verbally acknowledged these results. Electronically Signed   By: Awilda Metro M.D.   On: 06/01/2016 18:32    Microbiology: No results found for this or any previous visit (from the past 240 hour(s)).   Labs: Basic Metabolic Panel:  Recent Labs Lab 06/01/16 1827  NA 142  K 3.9  CL 106  CO2 29  GLUCOSE 109*  BUN 10  CREATININE 0.80  CALCIUM 9.9  MG 2.1   Liver Function Tests:  Recent Labs Lab 06/01/16 1827  AST 29  ALT 24  ALKPHOS 57  BILITOT 1.0  PROT 7.1  ALBUMIN 4.3   No results for input(s): LIPASE, AMYLASE in the last 168 hours. No results for input(s): AMMONIA in the last 168 hours. CBC:  Recent Labs Lab 06/01/16 1827  WBC 4.4  NEUTROABS 1.6*  HGB 14.7  HCT 44.8  MCV 96.6  PLT 174   Cardiac Enzymes: No results for input(s): CKTOTAL, CKMB, CKMBINDEX, TROPONINI in the last 168 hours. BNP: BNP (last 3 results) No results for input(s): BNP in the last 8760 hours.  ProBNP (last 3 results) No results for input(s): PROBNP in the last 8760 hours.  CBG: No results for input(s): GLUCAP in the last 168 hours.     SignedZannie Cove MD.  [facility] 06/03/2016, 1:15 PM

## 2016-06-03 NOTE — Progress Notes (Signed)
Physical Therapy Treatment Patient Details Name: [name] MRN: [id number] DOB: July 20, 1941 Today's Date: 06/03/2016    History of Present Illness [name] is an 75 y.o. female history of TIA, peripheral vascular disease including carotid disease, hyperlipidemia, depression and anxiety, presented to the ED at [facility] following acute onset of left-sided weakness and numbness involving face arm and leg, as well as speech difficulty. Onset was at 5:30 PM today. She's been on antiplatelet therapy with aspirin daily. Symptoms began to improved after arriving in the ED and subsequently resolved. CT scan of her head showed no acute intracranial abnormality. MRI of the brain showed no acute stroke.     PT Comments    Pt at baseline performance. She reports she was involved in regular exercise program prior to recent events, which she plans to resume in near future after she has f/u with her MD.  Pt without gross deficits today, plans to discharge home with her daughter's assist (through Tues).  No further recommendations except patient may benefit from OPPT if further deficits remain after her f/u visit with MD post hospital d/c. Pt agrees with recommendations.   Follow Up Recommendations  No PT follow up     Equipment Recommendations  None recommended by PT    Recommendations for Other Services       Precautions / Restrictions Precautions Precautions: None Restrictions Weight Bearing Restrictions: No    Mobility  Bed Mobility Overal bed mobility: Independent             General bed mobility comments: supine to/from sit  Transfers Overall transfer level: Independent Equipment used: None Transfers: Sit to/from [employer] to Stand: Independent Stand pivot transfers: Independent       General transfer comment: good safety techniques  Ambulation/Gait Ambulation/Gait assistance: Independent Ambulation Distance (Feet): 400 Feet Assistive  device: None Gait Pattern/deviations: WFL(Within Functional Limits) Gait velocity: decreased Gait velocity interpretation: Below normal speed for age/gender General Gait Details: 20' in 10.32 sec = 1.94 ft/sec   Stairs            Wheelchair Mobility    Modified Rankin (Stroke Patients Only) Modified Rankin (Stroke Patients Only) Pre-Morbid Rankin Score: No significant disability Modified Rankin: No symptoms     Balance Overall balance assessment: Independent Sitting-balance support: No upper extremity supported;Feet supported Sitting balance-Leahy Scale: Normal     Standing balance support: No upper extremity supported Standing balance-Leahy Scale: Good                      Cognition Arousal/Alertness: Awake/alert Behavior During Therapy: WFL for tasks assessed/performed Overall Cognitive Status: Within Functional Limits for tasks assessed                      Exercises      General Comments General comments (skin integrity, edema, etc.): no LOB to Rt, amb with head turns without LOB      Pertinent Vitals/Pain Pain Assessment: No/denies pain    Home Living                      Prior Function            PT Goals (current goals can now be found in the care plan section) Acute Rehab PT Goals Patient Stated Goal: return home today PT Goal Formulation: With patient Progress towards PT goals: Goals met/education completed, patient discharged from PT    Frequency  Physical Therapy Treatment Patient Details Name: [name] MRN: [id number] DOB: July 20, 1941 Today's Date: 06/03/2016    History of Present Illness [name] is an 75 y.o. female history of TIA, peripheral vascular disease including carotid disease, hyperlipidemia, depression and anxiety, presented to the ED at [facility] following acute onset of left-sided weakness and numbness involving face arm and leg, as well as speech difficulty. Onset was at 5:30 PM today. She's been on antiplatelet therapy with aspirin daily. Symptoms began to improved after arriving in the ED and subsequently resolved. CT scan of her head showed no acute intracranial abnormality. MRI of the brain showed no acute stroke.     PT Comments    Pt at baseline performance. She reports she was involved in regular exercise program prior to recent events, which she plans to resume in near future after she has f/u with her MD.  Pt without gross deficits today, plans to discharge home with her daughter's assist (through Tues).  No further recommendations except patient may benefit from OPPT if further deficits remain after her f/u visit with MD post hospital d/c. Pt agrees with recommendations.   Follow Up Recommendations  No PT follow up     Equipment Recommendations  None recommended by PT    Recommendations for Other Services       Precautions / Restrictions Precautions Precautions: None Restrictions Weight Bearing Restrictions: No    Mobility  Bed Mobility Overal bed mobility: Independent             General bed mobility comments: supine to/from sit  Transfers Overall transfer level: Independent Equipment used: None Transfers: Sit to/from [employer] to Stand: Independent Stand pivot transfers: Independent       General transfer comment: good safety techniques  Ambulation/Gait Ambulation/Gait assistance: Independent Ambulation Distance (Feet): 400 Feet Assistive  device: None Gait Pattern/deviations: WFL(Within Functional Limits) Gait velocity: decreased Gait velocity interpretation: Below normal speed for age/gender General Gait Details: 20' in 10.32 sec = 1.94 ft/sec   Stairs            Wheelchair Mobility    Modified Rankin (Stroke Patients Only) Modified Rankin (Stroke Patients Only) Pre-Morbid Rankin Score: No significant disability Modified Rankin: No symptoms     Balance Overall balance assessment: Independent Sitting-balance support: No upper extremity supported;Feet supported Sitting balance-Leahy Scale: Normal     Standing balance support: No upper extremity supported Standing balance-Leahy Scale: Good                      Cognition Arousal/Alertness: Awake/alert Behavior During Therapy: WFL for tasks assessed/performed Overall Cognitive Status: Within Functional Limits for tasks assessed                      Exercises      General Comments General comments (skin integrity, edema, etc.): no LOB to Rt, amb with head turns without LOB      Pertinent Vitals/Pain Pain Assessment: No/denies pain    Home Living                      Prior Function            PT Goals (current goals can now be found in the care plan section) Acute Rehab PT Goals Patient Stated Goal: return home today PT Goal Formulation: With patient Progress towards PT goals: Goals met/education completed, patient discharged from PT    Frequency

## 2016-06-03 NOTE — Progress Notes (Signed)
VASCULAR LAB PRELIMINARY  PRELIMINARY  PRELIMINARY  PRELIMINARY  Carotid duplex completed.    Preliminary report:  1-39% ICA stenosis.  Vertebral artery flow is antegrade.   Haddie Bruhl, RVT 06/03/2016, 10:11 AM

## 2016-06-03 NOTE — Progress Notes (Signed)
Patient given discharge instructions.  All questions and concerns addressed.  Left and right IV catheters removed without difficulty.  Patient left unit via wheelchair accompanied by staff and family.

## 2016-06-05 LAB — VAS US CAROTID
LCCAPDIAS: 19 cm/s
LEFT ECA DIAS: -14 cm/s
LEFT VERTEBRAL DIAS: -13 cm/s
LICADDIAS: -29 cm/s
LICAPDIAS: -35 cm/s
LICAPSYS: -127 cm/s
Left CCA dist dias: -18 cm/s
Left CCA dist sys: -67 cm/s
Left CCA prox sys: 94 cm/s
Left ICA dist sys: -79 cm/s
RCCAPDIAS: 17 cm/s
RIGHT ECA DIAS: -7 cm/s
RIGHT VERTEBRAL DIAS: -11 cm/s
Right CCA prox sys: 87 cm/s
Right cca dist sys: -86 cm/s

## 2016-06-11 ENCOUNTER — Other Ambulatory Visit: Payer: Self-pay | Admitting: Internal Medicine

## 2016-06-11 DIAGNOSIS — Z1231 Encounter for screening mammogram for malignant neoplasm of breast: Secondary | ICD-10-CM

## 2016-06-28 ENCOUNTER — Ambulatory Visit
Admission: RE | Admit: 2016-06-28 | Discharge: 2016-06-28 | Disposition: A | Discharge status: Home / Self Care | Payer: Commercial Managed Care - HMO | Source: Ambulatory Visit | Attending: Internal Medicine | Admitting: Internal Medicine

## 2016-06-28 DIAGNOSIS — Z1231 Encounter for screening mammogram for malignant neoplasm of breast: Secondary | ICD-10-CM

## 2016-06-29 ENCOUNTER — Other Ambulatory Visit: Payer: Self-pay

## 2016-06-29 NOTE — Patient Outreach (Signed)
[facility] Care Management  06/29/2016  [name] Feb 24, 1941 [id number]   Telephonic Care Coordination Services  Contact call to PCP, Dr. Dorothyann Pengobyn Sanders  Issue:  RN CM will contact PCP office to verify communication / contact was made regarding Nurse Call Event on 06/28/16 8:46 pm (patient accidentally took husbands medications: Tradjenta and Prevastatin after taking her own: Magnesium, laxative, sleep aid OTC.   RN CM spoke with contact [name] who verifies no awareness of this event.  RN CM requested MD be notified and requested call back to RN CM with update.    [name], MSHL, BSN, RN, CCM  [facility] Care Management Care Management Coordinator [phone] Direct [phone] Cell [phone] Office [phone] Fax [name]

## 2016-06-29 NOTE — Patient Outreach (Signed)
[facility] Care Management  06/29/2016  Sonya DammeMary L Peck Jun 21, 1941 [id number]   Referral Date:  06/29/16 Referral Source:  Nurse Call Center  Issue:  Event date:  06/28/2016:  Patient took husband's tradjenta and prevastatin by accident, she had already taken her own medication: Magnesium, laxative, sleep aid OTC.    Nurse Line advised patient to "contact PCP NOW:  You need to discuss this with your doctor."  Nurse line Nurse paged MD and advised patient if patient had not heard from the on-call MD within 30 minutes, call again.   Care Advice was give by Call Nurse Line per Poisoning (Adult) guidelines.   MR Review:   PCP:  Dr. Dorothyann Pengobyn Sanders, [facility] [phone]  Outreach call #1 to patient.  Patient not reached.  RN CM left HIPAA compliant voice message with name and number for call back.   Plan:  RN CM scheduled for next outreach call within one week.  RN CM will contact PCP office to verify communication / contact was made regarding this event.     Sonya Daviesrystal Michaeline Eckersley, MSHL, BSN, RN, CCM  [facility] Care Management Care Management Coordinator [phone] Direct [phone] Cell [phone] Office [phone] Fax [name]

## 2016-07-02 ENCOUNTER — Other Ambulatory Visit: Payer: Self-pay

## 2016-07-02 NOTE — Patient Outreach (Addendum)
[facility]) Care Management  07/02/2016  [name] 1941/07/17 [id number]   Referral Date:  06/29/16 Referral Source:  Nurse Call Center  Issue:  Event date:  06/28/2016:  Patient took husband's tradjenta and prevastatin by accident, she had already taken her own medication: Magnesium, laxative, sleep aid OTC.    Nurse Line advised patient to "contact PCP NOW:  You need to discuss this with your doctor."  Nurse line Nurse paged MD and advised patient if patient had not heard from the on-call MD within 30 minutes, call again.   Care Advice was give by Call Nurse Line per Poisoning (Adult) guidelines.  [facility]  YES  [address]  [place] [address] [phone](M) [phone] (H)  MR Review:   PCP:  Dr. Dorothyann Pengobyn Sanders, [facility] [phone] RN CM communicated this event to PCP 06/29/16.  Outreach call #2 to patient.   Patient not reached at home or cell number.  RN CM left HIPAA compliant voice message with name and number for call back.   Plan:  RN CM scheduled for next outreach call within one week.     Sonya Daviesrystal Kewana Sanon, MSHL, BSN, RN, CCM  [facility] Care Management Care Management Coordinator [phone] Direct [phone] Cell [phone] Office [phone] Fax [name]

## 2016-07-06 ENCOUNTER — Other Ambulatory Visit: Payer: Self-pay

## 2016-07-06 NOTE — Patient Outreach (Signed)
[facility]N) Care Management  07/06/2016  Gladys DammeMary L Alvira [birth year] [id number]   Referral Date:  06/29/16 Referral Source:  Nurse Call Center  Issue:  Event date:  06/28/2016:  Patient took husband's tradjenta and prevastatin by accident, she had already taken her own medication: Magnesium, laxative, sleep aid OTC.    Nurse Line advised patient to "contact PCP NOW:  You need to discuss this with your doctor."  Nurse line Nurse paged MD and advised patient if patient had not heard from the on-call MD within 30 minutes, call again.   Care Advice was give by Call Nurse Line per Poisoning (Adult) guidelines.  [facility]:  [facility]  [address]  [place] [address] [phone]) [phone] (H)  MR Review:   PCP:  Dr. Dorothyann Pengobyn Sanders, [facility] [phone] RN CM communicated this event to PCP 06/29/16.  Outreach call #3 to patient.   Patient not reached at home #.  No answer following multiple rings.   Plan:  RN CM mailed Unsuccessful Outreach Letter.  RN CM will close case if no response received back from patient within 2 weeks.     Simmie Daviesrystal Amirra Herling, MSHL, BSN, RN, CCM  [facility] Care Management Care Management Coordinator [phone] Direct [phone] Cell [phone] Office [phone] Fax [email or web address]

## 2016-07-20 ENCOUNTER — Other Ambulatory Visit: Payer: Self-pay

## 2016-07-20 NOTE — Patient Outreach (Signed)
[facility]) Care Management  07/20/2016  Gladys DammeMary L Nan [birth year] [id number]   Referral Date:  06/29/16 Referral Source:  Nurse Call Center  Issue:  Event date:  06/28/2016:  Patient took husband's tradjenta and prevastatin by accident, she had already taken her own medication: Magnesium, laxative, sleep aid OTC.    Nurse Line advised patient to "contact PCP NOW:  You need to discuss this with your doctor."  Nurse line Nurse paged MD and advised patient if patient had not heard from the on-call MD within 30 minutes, call again.   Care Advice was give by Call Nurse Line per Poisoning (Adult) guidelines.  [facility]:  YES  [address]  [place] [address] [phone]) [phone] (H)  MR Review:   PCP:  Dr. Dorothyann Pengobyn Sanders, [facility] [phone] RN CM communicated this event to PCP 06/29/16.  Case closed:  Unable to reach with 3 outreach attempts and unsuccessful outreach letter.  [facility] notified PCP notified   Donato Schultzrystal Journey Castonguay, MSHL, BSN, RN, CCM  [facility] Care Management Care Management Coordinator [phone] Direct [phone] Cell [phone] Office [phone] Fax [email or web address]

## 2016-08-01 ENCOUNTER — Ambulatory Visit (INDEPENDENT_AMBULATORY_CARE_PROVIDER_SITE_OTHER): Payer: PPO | Admitting: Emergency Medicine

## 2016-08-01 ENCOUNTER — Ambulatory Visit (INDEPENDENT_AMBULATORY_CARE_PROVIDER_SITE_OTHER): Payer: PPO

## 2016-08-01 VITALS — BP 126/78 | HR 76 | Temp 98.1°F | Resp 17 | Ht 66.5 in | Wt 157.0 lb

## 2016-08-01 DIAGNOSIS — S93401A Sprain of unspecified ligament of right ankle, initial encounter: Secondary | ICD-10-CM

## 2016-08-01 DIAGNOSIS — S99911A Unspecified injury of right ankle, initial encounter: Secondary | ICD-10-CM | POA: Diagnosis not present

## 2016-08-01 DIAGNOSIS — M25571 Pain in right ankle and joints of right foot: Secondary | ICD-10-CM | POA: Diagnosis not present

## 2016-08-01 NOTE — Progress Notes (Signed)
[name] 76 y.o.   Chief Complaint  Patient presents with  . Leg Pain    fell out of bed     HISTORY OF PRESENT ILLNESS: This is a 76 y.o. female complaining of injury to right lower leg; fell off the bed last night; denies head injury or LOC, injured both shoulders but has FROM and states they don't hurt anymore. Only c/o pain to right ankle. No other significant injuries or symptoms. Leg Pain   The incident occurred 12 to 24 hours ago. The incident occurred at home. The injury mechanism was a twisting injury. The pain is present in the right ankle. The quality of the pain is described as aching. The pain is at a severity of 7/10. The pain is moderate. The pain has been constant since onset. The symptoms are aggravated by movement and weight bearing.     Prior to Admission medications   Medication Sig Start Date End Date Taking? Authorizing Provider  aspirin 325 MG tablet Take 1 tablet (325 mg total) by mouth daily. 06/04/16  Yes Domenic Polite, MD  Calcium Carbonate-Vitamin D (CALCIUM-VITAMIN D) 500-200 MG-UNIT tablet Take 1 tablet by mouth every evening.    Yes Historical Provider, MD  CRESTOR 20 MG tablet TAKE ONE TABLET BY MOUTH EVERY DAY 11/14/10  Yes Biagio Borg, MD  dextromethorphan-guaiFENesin [facility] DM) 30-600 MG 12hr tablet Take 1 tablet by mouth 2 (two) times daily as needed for cough.   Yes Historical Provider, MD  Multiple Vitamins-Minerals (MULTIVITAMIN WITH MINERALS) tablet Take 1 tablet by mouth daily.   Yes Historical Provider, MD  Naproxen Sodium (ALEVE) 220 MG CAPS Take 2 capsules by mouth daily as needed (pain).    Yes Historical Provider, MD  traZODone (DESYREL) 50 MG tablet Take 25 mg by mouth at bedtime as needed for sleep (pt will get nightmares if she takes 56m).    Yes Historical Provider, MD    Allergies  Allergen Reactions  . Atorvastatin     REACTION: myalgia  . Penicillins Hives and Itching    Has patient had a PCN reaction causing immediate  rash, facial/tongue/throat swelling, SOB or lightheadedness with hypotension: no Has patient had a PCN reaction causing severe rash involving mucus membranes or skin necrosis: no Has patient had a PCN reaction that required hospitalization : no Has patient had a PCN reaction occurring within the last 10 years: no If all of the above answers are "NO", then may proceed with Cephalosporin use.     Patient Active Problem List   Diagnosis Date Noted  . Acute left hemiparesis ([place]   . TIA (transient ischemic attack) 06/01/2016  . CVA (cerebral vascular accident) ([place] 06/01/2016  . Transient cerebral ischemia   . Complicated migraine 096/78/9381 . Multinodular goiter 08/09/2011  . Lumbar pain with radiation down right leg 04/11/2011  . Rash 04/11/2011  . Impaired glucose tolerance 01/14/2011  . Preventative health care 01/14/2011  . WRIST PAIN, BILATERAL 05/25/2010  . TIA 11/22/2009  . LOW BACK PAIN, CHRONIC 11/22/2009  . ANXIETY 10/03/2009  . DEPRESSION 10/03/2009  . PERIPHERAL NEUROPATHY 10/03/2009  . BARRETTS ESOPHAGUS 10/03/2009  . CONSTIPATION, CHRONIC 10/03/2009  . URETHRAL STRICTURE 10/03/2009  . POSITIVE PPD 10/03/2009  . VOCAL CORD POLYP, HX OF 10/03/2009  . Carotid artery disease ([place] 08/31/2009  . Hyperlipemia 08/29/2009  . PERIPHERAL VASCULAR DISEASE 08/29/2009  . GERD 08/29/2009  . DFru[place]ur[place]EASE, CERVICAL 08/29/2009  . DChebanseDISEASE, LUMBAR 08/29/2009  . LEG PAIN,  BILATERAL 08/29/2009  . TRANSIENT ISCHEMIC ATTACK, HX OF 08/29/2009  . COLONIC POLYPS, HX OF 08/29/2009    Past Medical History:  Diagnosis Date  . Acute sinus infection 01/15/2011  . ANXIETY 10/03/2009  . BARRETTS ESOPHAGUS 10/03/2009  . CAROTID ARTERY DISEASE 08/31/2009  . COLONIC POLYPS, HX OF 08/29/2009  . CONSTIPATION, CHRONIC 10/03/2009  . DEPRESSION 10/03/2009  . [place] DISEASE, CERVICAL 08/29/2009  . [place] DISEASE, LUMBAR 08/29/2009  . Eustachian tube dysfunction 01/15/2011  . GERD 08/29/2009  .  HYPERLIPIDEMIA 08/29/2009  . Impaired glucose tolerance 01/14/2011  . LEG PAIN, BILATERAL 08/29/2009  . LOW BACK PAIN, CHRONIC 11/22/2009  . PERIPHERAL NEUROPATHY 10/03/2009  . PERIPHERAL VASCULAR DISEASE 08/29/2009  . POSITIVE PPD 10/03/2009  . Preventative health care 01/14/2011  . Thyroid nodule 08/09/2011  . TIA 11/22/2009  . TRANSIENT ISCHEMIC ATTACK, HX OF 08/29/2009  . URETHRAL STRICTURE 10/03/2009  . VOCAL CORD POLYP, HX OF 10/03/2009  . WRIST PAIN, BILATERAL 05/25/2010    Past Surgical History:  Procedure Laterality Date  . hx of right ankle fracture    . s/p vocal cord polyps  1984   chronic hoarseness  . stress test negative  2003,2004,2006  . TUBAL LIGATION    . uterine prolapse      Social History   Social History  . Marital status: Married    Spouse name: N/A  . Number of children: 3  . Years of education: N/A   Occupational History  . 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 Smoking status: Never Smoker  . Smokeless tobacco: Never Used  . Alcohol use No  . Drug use: No  . Sexual activity: Not on file   Other Topics Concern  . Not on file   Social History Narrative   Husband with dementia    Family History  Problem Relation Age of Onset  . Stroke Mother   . Heart disease Mother   . Hypertension Mother   . Diabetes Mother   . Multiple myeloma Sister   . Cancer Other     lung     Review of Systems  Constitutional: Negative.   HENT: Negative.   Eyes: Negative.   Respiratory: Negative.   Cardiovascular: Negative.   Gastrointestinal: Negative.   Genitourinary: Negative.   Musculoskeletal: Positive for joint pain (right ankle).  Skin: Negative.   Neurological: Negative.   Endo/Heme/Allergies: Negative.   Psychiatric/Behavioral: Negative.   All other systems reviewed and are negative.  Vitals:   08/01/16 1321  BP: 126/78  Pulse: 76  Resp: 17  Temp: 98.1 F (36.7 C)     Physical Exam  Constitutional: She is  oriented to person, place, and time. She appears well-developed and well-nourished.  HENT:  Head: Normocephalic and atraumatic.  Eyes: EOM are normal. Pupils are equal, round, and reactive to light.  Neck: Normal range of motion. Neck supple.  Cardiovascular: Normal rate and regular rhythm.   Pulmonary/Chest: Effort normal and breath sounds normal.  Musculoskeletal:  Right ankle: +swelling and tenderness with LROM, medial >lateral  Neurological: She is alert and oriented to person, place, and time.  Skin: Skin is warm and dry. Capillary refill takes less than 2 seconds.  Psychiatric: She has a normal mood and affect. Her behavior is normal.     ASSESSMENT & PLAN: Ty was seen today for leg pain.  Diagnoses and all orders for this visit:  Sprain of right ankle, unspecified ligament, initial encounter -  DG Ankle Complete Right; Future -     Apply ankle splint air cast -     Care order/instruction:  Acute right ankle pain    Patient Instructions       IF you received an x-ray today, you will receive an invoice from [facility]. Please contact [facility] at [phone] with questions or concerns regarding your invoice.   IF you received labwork today, you will receive an invoice from [facility]. Please contact [facility] at [phone] with questions or concerns regarding your invoice.   Our billing staff will not be able to assist you with questions regarding bills from these companies.  You will be contacted with the lab results as soon as they are available. The fastest way to get your results is to activate your My Chart account. Instructions are located on the last page of this paperwork. If you have not heard from [place] regarding the results in 2 weeks, please contact this office.      Ankle Sprain Introduction An ankle sprain is a stretch or tear in one of the tough tissues (ligaments) in your ankle. Follow these instructions at home:  Rest  your ankle.  Take over-the-counter and prescription medicines only as told by your doctor.  For 2-3 days, keep your ankle higher than the level of your heart (elevated) as much as possible.  If directed, put ice on the area:  Put ice in a plastic bag.  Place a towel between your skin and the bag.  Leave the ice on for 20 minutes, 2-3 times a day.  If you were given a brace:  Wear it as told.  Take it off to shower or bathe.  Try not to move your ankle much, but wiggle your toes from time to time. This helps to prevent swelling.  If you were given an elastic bandage (dressing):  Take it off when you shower or bathe.  Try not to move your ankle much, but wiggle your toes from time to time. This helps to prevent swelling.  Adjust the bandage to make it more comfortable if it feels too tight.  Loosen the bandage if you lose feeling in your foot, your foot tingles, or your foot gets cold and blue.  If you have crutches, use them as told by your doctor. Continue to use them until you can walk without feeling pain in your ankle. Contact a doctor if:  Your bruises or swelling are quickly getting worse.  Your pain does not get better after you take medicine. Get help right away if:  You cannot feel your toes or foot.  Your toes or your foot looks blue.  You have very bad pain that gets worse. This information is not intended to replace advice given to you by your health care provider. Make sure you discuss any questions you have with your health care provider. Document Released: 01/02/2008 Document Revised: 12/22/2015 Document Reviewed: 02/15/2015  2017 Elsevier     Agustina Caroli, MD [facility]

## 2016-08-01 NOTE — Patient Instructions (Addendum)
     IF you received an x-ray today, you will receive an invoice from [facility]. Please contact [facility] at [phone] with questions or concerns regarding your invoice.   IF you received labwork today, you will receive an invoice from [facility]. Please contact [facility] at [phone] with questions or concerns regarding your invoice.   Our billing staff will not be able to assist you with questions regarding bills from these companies.  You will be contacted with the lab results as soon as they are available. The fastest way to get your results is to activate your My Chart account. Instructions are located on the last page of this paperwork. If you have not heard from us regarding the results in 2 weeks, please contact this office.      Ankle Sprain Introduction An ankle sprain is a stretch or tear in one of the tough tissues (ligaments) in your ankle. Follow these instructions at home:  Rest your ankle.  Take over-the-counter and prescription medicines only as told by your doctor.  For 2-3 days, keep your ankle higher than the level of your heart (elevated) as much as possible.  If directed, put ice on the area:  Put ice in a plastic bag.  Place a towel between your skin and the bag.  Leave the ice on for 20 minutes, 2-3 times a day.  If you were given a brace:  Wear it as told.  Take it off to shower or bathe.  Try not to move your ankle much, but wiggle your toes from time to time. This helps to prevent swelling.  If you were given an elastic bandage (dressing):  Take it off when you shower or bathe.  Try not to move your ankle much, but wiggle your toes from time to time. This helps to prevent swelling.  Adjust the bandage to make it more comfortable if it feels too tight.  Loosen the bandage if you lose feeling in your foot, your foot tingles, or your foot gets cold and blue.  If you have crutches, use them as told by your doctor.  Continue to use them until you can walk without feeling pain in your ankle. Contact a doctor if:  Your bruises or swelling are quickly getting worse.  Your pain does not get better after you take medicine. Get help right away if:  You cannot feel your toes or foot.  Your toes or your foot looks blue.  You have very bad pain that gets worse. This information is not intended to replace advice given to you by your health care provider. Make sure you discuss any questions you have with your health care provider. Document Released: 01/02/2008 Document Revised: 12/22/2015 Document Reviewed: 02/15/2015  2017 Elsevier  

## 2016-08-16 ENCOUNTER — Ambulatory Visit: Payer: Commercial Managed Care - HMO | Admitting: Neurology

## 2016-09-06 ENCOUNTER — Telehealth: Payer: Self-pay | Admitting: Neurology

## 2016-09-06 NOTE — Telephone Encounter (Signed)
Pt called to r/s appt from 08/16/16(office was closed due to snow/ice). 1st available is 10/23/16. Pt said she is doing ok except she finds herself stumbling which is new symptom for her. I advised her I would send a msg to Dr Pearlean BrownieSethi so he could be aware of this.

## 2016-09-06 NOTE — Telephone Encounter (Signed)
Pt has an appt on 09/11/2016 with Dr. Pearlean BrownieSethi. Pt would like to address her stumbling sometimes when she walks.

## 2016-09-11 ENCOUNTER — Ambulatory Visit: Payer: PPO | Admitting: Neurology

## 2016-09-20 DIAGNOSIS — I6522 Occlusion and stenosis of left carotid artery: Secondary | ICD-10-CM | POA: Diagnosis not present

## 2016-10-04 DIAGNOSIS — Z79899 Other long term (current) drug therapy: Secondary | ICD-10-CM | POA: Diagnosis not present

## 2016-10-04 DIAGNOSIS — R296 Repeated falls: Secondary | ICD-10-CM | POA: Diagnosis not present

## 2016-10-04 DIAGNOSIS — Z0000 Encounter for general adult medical examination without abnormal findings: Secondary | ICD-10-CM | POA: Diagnosis not present

## 2016-10-04 DIAGNOSIS — E785 Hyperlipidemia, unspecified: Secondary | ICD-10-CM | POA: Diagnosis not present

## 2016-10-04 DIAGNOSIS — R7309 Other abnormal glucose: Secondary | ICD-10-CM | POA: Diagnosis not present

## 2016-10-04 DIAGNOSIS — E559 Vitamin D deficiency, unspecified: Secondary | ICD-10-CM | POA: Diagnosis not present

## 2016-10-10 DIAGNOSIS — R2681 Unsteadiness on feet: Secondary | ICD-10-CM | POA: Diagnosis not present

## 2016-10-10 DIAGNOSIS — M48061 Spinal stenosis, lumbar region without neurogenic claudication: Secondary | ICD-10-CM | POA: Diagnosis not present

## 2016-10-18 DIAGNOSIS — R0789 Other chest pain: Secondary | ICD-10-CM | POA: Diagnosis not present

## 2016-10-18 DIAGNOSIS — Z8673 Personal history of transient ischemic attack (TIA), and cerebral infarction without residual deficits: Secondary | ICD-10-CM | POA: Diagnosis not present

## 2016-10-18 DIAGNOSIS — I739 Peripheral vascular disease, unspecified: Secondary | ICD-10-CM | POA: Diagnosis not present

## 2016-10-18 DIAGNOSIS — R29898 Other symptoms and signs involving the musculoskeletal system: Secondary | ICD-10-CM | POA: Diagnosis not present

## 2016-10-19 DIAGNOSIS — Z86010 Personal history of colonic polyps: Secondary | ICD-10-CM | POA: Diagnosis not present

## 2016-10-19 DIAGNOSIS — K5900 Constipation, unspecified: Secondary | ICD-10-CM | POA: Diagnosis not present

## 2016-10-23 ENCOUNTER — Ambulatory Visit: Payer: PPO | Admitting: Neurology

## 2016-11-06 ENCOUNTER — Ambulatory Visit (INDEPENDENT_AMBULATORY_CARE_PROVIDER_SITE_OTHER): Payer: PPO | Admitting: Neurology

## 2016-11-06 ENCOUNTER — Encounter (INDEPENDENT_AMBULATORY_CARE_PROVIDER_SITE_OTHER): Payer: Self-pay

## 2016-11-06 ENCOUNTER — Encounter: Payer: Self-pay | Admitting: Neurology

## 2016-11-06 VITALS — BP 143/78 | HR 68 | Ht 66.0 in | Wt 162.0 lb

## 2016-11-06 DIAGNOSIS — R2689 Other abnormalities of gait and mobility: Secondary | ICD-10-CM | POA: Diagnosis not present

## 2016-11-06 DIAGNOSIS — G451 Carotid artery syndrome (hemispheric): Secondary | ICD-10-CM

## 2016-11-06 MED ORDER — CLOPIDOGREL BISULFATE 75 MG PO TABS: 75.0000 mg | ORAL_TABLET | Freq: Every day | ORAL | 11 refills | Status: DC

## 2016-11-06 NOTE — Patient Instructions (Signed)
I had a long d/w patient about her recent TIA, risk for recurrent stroke/TIAs, personally independently reviewed imaging studies and stroke evaluation results and answered questions.. Discontinue aspirin and change to Plavix 75 mg daily  for secondary stroke prevention and maintain strict control of hypertension with blood pressure goal below 130/90, diabetes with hemoglobin A1c goal below 6.5% and lipids with LDL cholesterol goal below 70 mg/dL. I also advised the patient to eat a healthy diet with plenty of whole grains, cereals, fruits and vegetables, exercise regularly and maintain ideal body weight .we also talked about her gait instability and right leg weakness which may perhaps related to underlying peripheral vascular disease are her history of lumbar spinal stenosis. I recommend she follow-up with Dr. Shon Baton followed extremity arterial Dopplers and if these are negative see her orthopedic M.D. for spinal stenosis evaluation. Followup in the future with my nurse practitioner in 3 months or call earlier if necessary  Stroke Prevention Some medical conditions and behaviors are associated with an increased chance of having a stroke. You may prevent a stroke by making healthy choices and managing medical conditions. How can I reduce my risk of having a stroke?  Stay physically active. Get at least 30 minutes of activity on most or all days.  Do not smoke. It may also be helpful to avoid exposure to secondhand smoke.  Limit alcohol use. Moderate alcohol use is considered to be:  No more than 2 drinks per day for men.  No more than 1 drink per day for nonpregnant women.  Eat healthy foods. This involves:  Eating 5 or more servings of fruits and vegetables a day.  Making dietary changes that address high blood pressure (hypertension), high cholesterol, diabetes, or obesity.  Manage your cholesterol levels.  Making food choices that are high in fiber and low in saturated fat, trans fat, and  cholesterol may control cholesterol levels.  Take any prescribed medicines to control cholesterol as directed by your health care provider.  Manage your diabetes.  Controlling your carbohydrate and sugar intake is recommended to manage diabetes.  Take any prescribed medicines to control diabetes as directed by your health care provider.  Control your hypertension.  Making food choices that are low in salt (sodium), saturated fat, trans fat, and cholesterol is recommended to manage hypertension.  Ask your health care provider if you need treatment to lower your blood pressure. Take any prescribed medicines to control hypertension as directed by your health care provider.  If you are 49-34 years of age, have your blood pressure checked every 3-5 years. If you are 92 years of age or older, have your blood pressure checked every year.  Maintain a healthy weight.  Reducing calorie intake and making food choices that are low in sodium, saturated fat, trans fat, and cholesterol are recommended to manage weight.  Stop drug abuse.  Avoid taking birth control pills.  Talk to your health care provider about the risks of taking birth control pills if you are over 66 years old, smoke, get migraines, or have ever had a blood clot.  Get evaluated for sleep disorders (sleep apnea).  Talk to your health care provider about getting a sleep evaluation if you snore a lot or have excessive sleepiness.  Take medicines only as directed by your health care provider.  For some people, aspirin or blood thinners (anticoagulants) are helpful in reducing the risk of forming abnormal blood clots that can lead to stroke. If you have the irregular heart  rhythm of atrial fibrillation, you should be on a blood thinner unless there is a good reason you cannot take them.  Understand all your medicine instructions.  Make sure that other conditions (such as anemia or atherosclerosis) are addressed. Get help right  away if:  You have sudden weakness or numbness of the face, arm, or leg, especially on one side of the body.  Your face or eyelid droops to one side.  You have sudden confusion.  You have trouble speaking (aphasia) or understanding.  You have sudden trouble seeing in one or both eyes.  You have sudden trouble walking.  You have dizziness.  You have a loss of balance or coordination.  You have a sudden, severe headache with no known cause.  You have new chest pain or an irregular heartbeat. Any of these symptoms may represent a serious problem that is an emergency. Do not wait to see if the symptoms will go away. Get medical help at once. Call your local emergency services (911 in [place] Do not drive yourself to the hospital. This information is not intended to replace advice given to you by your health care provider. Make sure you discuss any questions you have with your health care provider. Document Released: 08/23/2004 Document Revised: 12/22/2015 Document Reviewed: 01/16/2013 Elsevier Interactive Patient Education  2017 [employer].

## 2016-11-06 NOTE — Progress Notes (Signed)
[facility] [address] [place]. [place] [address] [phone]       OFFICE CONSULT NOTE  [name] Date of Birth:  09-May-1941 Medical Record Number:  [id number]   Referring MD:  Molli Barrows Reason for Referral:  TIA HPI: [name] is a pleasant 76 year old African-American lady who had a transient episode of left hemiparesis and sensory loss on 06/01/16. She states she is driving her husband when he commented that she was leaving all through the lanes. She noticed that her left leg was heavy. She was able to go to the barber shop where they were going and felt all right. But when they finished and she was driving back she noticed that her left leg again became heavy and weak. She drove instead to [facility] where she was evaluated. Her symptoms improved shop shortly after arrival. She did feel drawing of the left side of the face as well as numbness and weakness of left arm and leg. MRI scan of the brain was obtained which I personally reviewed showed no acute abnormality. MRI of the brain showed no large vessel occlusion. Carotid ultrasound showed no significant extracranial stenosis. Pounds resting echo showed normal ejection fraction. Patient was on aspirin 81 mg which was transiently 25 mg daily which is tolerating well without any stomach side effects of bruising or bleeding. The patient has not had any recurrent stroke or TIA symptoms. She was on Crestor 20 mg daily but recently her cardiologist Dr. Jacinto Halim increased it to 40 mg daily. The patient complains of intermittent right leg weakness and leaning to the right. Dr. Newt Lukes has ordered a lower extremity arterial Doppler to be performed. She does give a history of remote back pain having epidural steroid injections in her back. She does see orthopedic physician. She denies radicular pain but does complain of pain in her leg at night. She has a remote history of possible TIA 5 years ago but she is vague  about the symptoms and feels her primary physician who evaluated her felt it may have been stress related. She states she does not have high blood pressure. She is a healthy eater and she is quite active. 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 She is independent in activities of daily living.  ROS:   14 system review of systems is positive for  fatigue, ear discharge, constipation, headache, restless leg, walking difficulty, and decreased concentration, skin itching and all other systems negative PMH:  Past Medical History:  Diagnosis Date  . Acute sinus infection 01/15/2011  . ANXIETY 10/03/2009  . BARRETTS ESOPHAGUS 10/03/2009  . CAROTID ARTERY DISEASE 08/31/2009  . COLONIC POLYPS, HX OF 08/29/2009  . CONSTIPATION, CHRONIC 10/03/2009  . DEPRESSION 10/03/2009  . DISC DISEASE, CERVICAL 08/29/2009  . DISC DISEASE, LUMBAR 08/29/2009  . Eustachian tube dysfunction 01/15/2011  . GERD 08/29/2009  . HYPERLIPIDEMIA 08/29/2009  . Impaired glucose tolerance 01/14/2011  . LEG PAIN, BILATERAL 08/29/2009  . LOW BACK PAIN, CHRONIC 11/22/2009  . PERIPHERAL NEUROPATHY 10/03/2009  . PERIPHERAL VASCULAR DISEASE 08/29/2009  . POSITIVE PPD 10/03/2009  . Preventative health care 01/14/2011  . Stroke (HCC)   . Thyroid nodule 08/09/2011  . TIA 11/22/2009  . TRANSIENT ISCHEMIC ATTACK, HX OF 08/29/2009  . URETHRAL STRICTURE 10/03/2009  . VOCAL CORD POLYP, HX OF 10/03/2009  . WRIST PAIN, BILATERAL 05/25/2010    Social History:  Social History   Social History  . Marital status: Married  Spouse name: N/A  . Number of children: 3  . Years of education: N/A   Occupational History  . 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 Smoking status: Never Smoker  . Smokeless tobacco: Never Used  . Alcohol use No  . Drug use: No  . Sexual activity: Not on file   Other Topics Concern  . Not on file   Social History Narrative   Husband with dementia     Medications:   Current Outpatient Prescriptions on File Prior to Visit  Medication Sig Dispense Refill  . Calcium Carbonate-Vitamin D (CALCIUM-VITAMIN D) 500-200 MG-UNIT tablet Take 1 tablet by mouth every evening.     Marland Kitchen CRESTOR 20 MG tablet TAKE ONE TABLET BY MOUTH EVERY DAY 90 each 3  . dextromethorphan-guaiFENesin (MUCINEX DM) 30-600 MG 12hr tablet Take 1 tablet by mouth 2 (two) times daily as needed for cough.    . Multiple Vitamins-Minerals (MULTIVITAMIN WITH MINERALS) tablet Take 1 tablet by mouth daily.    . [DISCONTINUED] gabapentin (NEURONTIN) 600 MG tablet Take 1 tablet (600 mg total) by mouth 3 (three) times daily as needed. 90 tablet 5   No current facility-administered medications on file prior to visit.     Allergies:   Allergies  Allergen Reactions  . Atorvastatin     REACTION: myalgia  . Penicillins Hives and Itching    Has patient had a PCN reaction causing immediate rash, facial/tongue/throat swelling, SOB or lightheadedness with hypotension: no Has patient had a PCN reaction causing severe rash involving mucus membranes or skin necrosis: no Has patient had a PCN reaction that required hospitalization : no Has patient had a PCN reaction occurring within the last 10 years: no If all of the above answers are "NO", then may proceed with Cephalosporin use.     Physical Exam General: well developed, well nourished elderly African-American lady, seated, in no evident distress Head: head normocephalic and atraumatic.   Neck: supple with no carotid or supraclavicular bruits Cardiovascular: regular rate and rhythm, no murmurs Musculoskeletal: no deformity Skin:  no rash/petichiae Vascular:  Diminished distal pulses in the right lower extremity  Neurologic Exam Mental Status: Awake and fully alert. Oriented to place and time. Recent and remote memory intact. Attention span, concentration and fund of knowledge appropriate. Mood and affect appropriate.  Cranial Nerves:  Fundoscopic exam reveals sharp disc margins. Pupils equal, briskly reactive to light. Extraocular movements full without nystagmus. Visual fields full to confrontation. Hearing intact. Facial sensation intact. Face, tongue, palate moves normally and symmetrically.  Motor: Normal bulk and tone. Normal strength in all tested extremity muscles. Sensory.: intact to touch , pinprick , position and vibratory sensation.  Coordination: Rapid alternating movements normal in all extremities. Finger-to-nose and heel-to-shin performed accurately bilaterally. Gait and Station: Arises from chair without difficulty. Stance is normal. Gait demonstrates normal stride length and balance . Able to heel, toe and tandem walk with mild difficulty.  Reflexes: 1+ and symmetric. Toes downgoing.   NIHSS  0 Modified Rankin  1   ASSESSMENT: 89 year lady with right hemispheric TIA in November 2017 likely due to small vessel disease. Intermittent gait instability and right leg weakness unlikely to be related to her cerebrovascular disease and possibly related to peripheral vascular disease versus lumbar spinal stenosis.    PLAN: I had a long d/w patient about her recent TIA, risk for recurrent stroke/TIAs, personally independently reviewed imaging studies and stroke evaluation results and answered questions.. Discontinue  aspirin and change to Plavix 75 mg daily  for secondary stroke prevention and maintain strict control of hypertension with blood pressure goal below 130/90, diabetes with hemoglobin A1c goal below 6.5% and lipids with LDL cholesterol goal below 70 mg/dL. I also advised the patient to eat a healthy diet with plenty of whole grains, cereals, fruits and vegetables, exercise regularly and maintain ideal body weight .we also talked about her gait instability and right leg weakness which may perhaps related to underlying peripheral vascular disease are her history of lumbar spinal stenosis. I recommend she follow-up  with Dr. Shon Baton followed extremity arterial Dopplers and if these are negative see her orthopedic M.D. for spinal stenosis evaluation. Greater than 50% time during this 30 minute consultation visit was spent on counseling and coordination of care about her TIA as well as gait instability and answering questions Followup in the future with my nurse practitioner in 3 months or call earlier if necessary Delia Heady, MD  [facility] [address] [place], [place] [address]  Phone [phone] Fax [phone] Note: This document was prepared with digital dictation and possible smart phrase technology. Any transcriptional errors that result from this process are unintentional.

## 2016-11-27 DIAGNOSIS — I739 Peripheral vascular disease, unspecified: Secondary | ICD-10-CM | POA: Diagnosis not present

## 2016-12-03 DIAGNOSIS — R0789 Other chest pain: Secondary | ICD-10-CM | POA: Diagnosis not present

## 2016-12-03 DIAGNOSIS — R29898 Other symptoms and signs involving the musculoskeletal system: Secondary | ICD-10-CM | POA: Diagnosis not present

## 2016-12-03 DIAGNOSIS — I739 Peripheral vascular disease, unspecified: Secondary | ICD-10-CM | POA: Diagnosis not present

## 2016-12-03 DIAGNOSIS — Z8673 Personal history of transient ischemic attack (TIA), and cerebral infarction without residual deficits: Secondary | ICD-10-CM | POA: Diagnosis not present

## 2016-12-13 DIAGNOSIS — H60333 Swimmer's ear, bilateral: Secondary | ICD-10-CM | POA: Insufficient documentation

## 2016-12-13 DIAGNOSIS — R42 Dizziness and giddiness: Secondary | ICD-10-CM | POA: Diagnosis not present

## 2017-01-08 ENCOUNTER — Ambulatory Visit (INDEPENDENT_AMBULATORY_CARE_PROVIDER_SITE_OTHER): Payer: PPO | Admitting: Physician Assistant

## 2017-01-08 ENCOUNTER — Encounter: Payer: Self-pay | Admitting: Family Medicine

## 2017-01-08 ENCOUNTER — Encounter (HOSPITAL_COMMUNITY): Payer: Self-pay

## 2017-01-08 ENCOUNTER — Emergency Department (HOSPITAL_COMMUNITY)
Admission: EM | Admit: 2017-01-08 | Discharge: 2017-01-08 | Disposition: A | Discharge status: Home / Self Care | Payer: PPO | Attending: Emergency Medicine | Admitting: Emergency Medicine

## 2017-01-08 VITALS — BP 152/84 | HR 63 | Temp 97.9°F | Resp 16 | Ht 65.5 in | Wt 157.4 lb

## 2017-01-08 DIAGNOSIS — R260 Ataxic gait: Secondary | ICD-10-CM

## 2017-01-08 DIAGNOSIS — R5383 Other fatigue: Secondary | ICD-10-CM | POA: Diagnosis not present

## 2017-01-08 DIAGNOSIS — R29810 Facial weakness: Secondary | ICD-10-CM

## 2017-01-08 DIAGNOSIS — R030 Elevated blood-pressure reading, without diagnosis of hypertension: Secondary | ICD-10-CM | POA: Diagnosis not present

## 2017-01-08 DIAGNOSIS — Z8673 Personal history of transient ischemic attack (TIA), and cerebral infarction without residual deficits: Secondary | ICD-10-CM

## 2017-01-08 DIAGNOSIS — Z79899 Other long term (current) drug therapy: Secondary | ICD-10-CM | POA: Insufficient documentation

## 2017-01-08 DIAGNOSIS — Z7982 Long term (current) use of aspirin: Secondary | ICD-10-CM | POA: Diagnosis not present

## 2017-01-08 DIAGNOSIS — R269 Unspecified abnormalities of gait and mobility: Secondary | ICD-10-CM | POA: Diagnosis not present

## 2017-01-08 DIAGNOSIS — I6789 Other cerebrovascular disease: Secondary | ICD-10-CM | POA: Diagnosis not present

## 2017-01-08 LAB — CBC WITH DIFFERENTIAL/PLATELET
BASOS ABS: 0 10*3/uL (ref 0.0–0.1)
BASOS PCT: 1 %
EOS ABS: 0.1 10*3/uL (ref 0.0–0.7)
Eosinophils Relative: 3 %
HCT: 47.5 % — ABNORMAL HIGH (ref 36.0–46.0)
Hemoglobin: 15.5 g/dL — ABNORMAL HIGH (ref 12.0–15.0)
Lymphocytes Relative: 52 %
Lymphs Abs: 1.9 10*3/uL (ref 0.7–4.0)
MCH: 31.4 pg (ref 26.0–34.0)
MCHC: 32.6 g/dL (ref 30.0–36.0)
MCV: 96.2 fL (ref 78.0–100.0)
MONO ABS: 0.2 10*3/uL (ref 0.1–1.0)
Monocytes Relative: 6 %
NEUTROS PCT: 38 %
Neutro Abs: 1.4 10*3/uL — ABNORMAL LOW (ref 1.7–7.7)
Platelets: 137 10*3/uL — ABNORMAL LOW (ref 150–400)
RBC: 4.94 MIL/uL (ref 3.87–5.11)
RDW: 14.7 % (ref 11.5–15.5)
WBC: 3.6 10*3/uL — ABNORMAL LOW (ref 4.0–10.5)

## 2017-01-08 LAB — BASIC METABOLIC PANEL
ANION GAP: 8 (ref 5–15)
BUN: 7 mg/dL (ref 6–20)
CALCIUM: 9.7 mg/dL (ref 8.9–10.3)
CO2: 27 mmol/L (ref 22–32)
CREATININE: 0.72 mg/dL (ref 0.44–1.00)
Chloride: 107 mmol/L (ref 101–111)
Glucose, Bld: 94 mg/dL (ref 65–99)
Potassium: 3.8 mmol/L (ref 3.5–5.1)
SODIUM: 142 mmol/L (ref 135–145)

## 2017-01-08 NOTE — ED Triage Notes (Signed)
Pt. Coming from [facility] for stroke-like symptoms starting yesterday. Pt. States yesterday she bilateral hand swelling and noted an abnormal gait. Hand swelling had subsided, but pt. Noted gait was worse today and her lips became numb. Pt. States she always has low BP and knew something was wrong today when her systolic BP was in the 150's. Pt. Takes ASA, plavix, prevastatin. Pt. Aox4 and denies pain.

## 2017-01-08 NOTE — Patient Instructions (Signed)
     IF you received an x-ray today, you will receive an invoice from [facility]. Please contact [facility] at [phone] with questions or concerns regarding your invoice.   IF you received labwork today, you will receive an invoice from [facility]. Please contact [facility] at [phone] with questions or concerns regarding your invoice.   Our billing staff will not be able to assist you with questions regarding bills from these companies.  You will be contacted with the lab results as soon as they are available. The fastest way to get your results is to activate your My Chart account. Instructions are located on the last page of this paperwork. If you have not heard from us regarding the results in 2 weeks, please contact this office.     

## 2017-01-08 NOTE — Progress Notes (Signed)
PRIMARY CARE AT [facility] [address], [place] [address] [phone]  Date:  01/08/2017   Name:  [name]   DOB:  July 21, 1941   MRN:  [id number]  PCP:  Dorothyann Peng, MD    History of Present Illness:  [name] is a 76 y.o. female patient who presents to PCP with  Chief Complaint  Patient presents with  . blood pressure    152/90, per pt generally 112/72  . stumbling around this am  . lips    "feels numb and tight about 1 hour ago     Patient reported that this morning she was having elevated blood pressure facial tightness, and stumbling.  She has no chest pains, palpitations, or sob.   She has dizziness.   She also noticed extremity swelling of her hands.  She noticed hand swelling resolved.  No fevers.   Patient takes 325, and clopidogrel.  She did not take the 325 aspirin today.   She is hydrating well with 1 liter per day.    She has a hx of TIA.    Patient Active Problem List   Diagnosis Date Noted  . Acute left hemiparesis (HCC)   . TIA (transient ischemic attack) 06/01/2016  . CVA (cerebral vascular accident) (HCC) 06/01/2016  . Transient cerebral ischemia   . Complicated migraine 08/22/2014  . Multinodular goiter 08/09/2011  . Lumbar pain with radiation down right leg 04/11/2011  . Rash 04/11/2011  . Impaired glucose tolerance 01/14/2011  . Preventative health care 01/14/2011  . WRIST PAIN, BILATERAL 05/25/2010  . TIA 11/22/2009  . LOW BACK PAIN, CHRONIC 11/22/2009  . ANXIETY 10/03/2009  . DEPRESSION 10/03/2009  . PERIPHERAL NEUROPATHY 10/03/2009  . BARRETTS ESOPHAGUS 10/03/2009  . CONSTIPATION, CHRONIC 10/03/2009  . URETHRAL STRICTURE 10/03/2009  . POSITIVE PPD 10/03/2009  . VOCAL CORD POLYP, HX OF 10/03/2009  . Carotid artery disease (HCC) 08/31/2009  . Hyperlipemia 08/29/2009  . PERIPHERAL VASCULAR DISEASE 08/29/2009  . GERD 08/29/2009  . DISC DISEASE, CERVICAL 08/29/2009  . DISC DISEASE, LUMBAR 08/29/2009  . LEG PAIN,  BILATERAL 08/29/2009  . TRANSIENT ISCHEMIC ATTACK, HX OF 08/29/2009  . COLONIC POLYPS, HX OF 08/29/2009    Past Medical History:  Diagnosis Date  . Acute sinus infection 01/15/2011  . ANXIETY 10/03/2009  . BARRETTS ESOPHAGUS 10/03/2009  . CAROTID ARTERY DISEASE 08/31/2009  . COLONIC POLYPS, HX OF 08/29/2009  . CONSTIPATION, CHRONIC 10/03/2009  . DEPRESSION 10/03/2009  . DISC DISEASE, CERVICAL 08/29/2009  . DISC DISEASE, LUMBAR 08/29/2009  . Eustachian tube dysfunction 01/15/2011  . GERD 08/29/2009  . HYPERLIPIDEMIA 08/29/2009  . Impaired glucose tolerance 01/14/2011  . LEG PAIN, BILATERAL 08/29/2009  . LOW BACK PAIN, CHRONIC 11/22/2009  . PERIPHERAL NEUROPATHY 10/03/2009  . PERIPHERAL VASCULAR DISEASE 08/29/2009  . POSITIVE PPD 10/03/2009  . Preventative health care 01/14/2011  . Stroke (HCC)   . Thyroid nodule 08/09/2011  . TIA 11/22/2009  . TRANSIENT ISCHEMIC ATTACK, HX OF 08/29/2009  . URETHRAL STRICTURE 10/03/2009  . VOCAL CORD POLYP, HX OF 10/03/2009  . WRIST PAIN, BILATERAL 05/25/2010    Past Surgical History:  Procedure Laterality Date  . hx of right ankle fracture    . s/p vocal cord polyps  1984   chronic hoarseness  . stress test negative  2003,2004,2006  . TUBAL LIGATION    . uterine prolapse      Social History  Substance Use Topics  . Smoking status: Never Smoker  . Smokeless tobacco:  Never Used  . Alcohol use No    Family History  Problem Relation Age of Onset  . Stroke Mother   . Heart disease Mother   . Hypertension Mother   . Diabetes Mother   . Multiple myeloma Sister   . Cancer Other        lung    Allergies  Allergen Reactions  . Atorvastatin     REACTION: myalgia  . Penicillins Hives and Itching    Has patient had a PCN reaction causing immediate rash, facial/tongue/throat swelling, SOB or lightheadedness with hypotension: no Has patient had a PCN reaction causing severe rash involving mucus membranes or skin necrosis: no Has patient had a PCN reaction that  required hospitalization : no Has patient had a PCN reaction occurring within the last 10 years: no If all of the above answers are "NO", then may proceed with Cephalosporin use.     Medication list has been reviewed and updated.  Current Outpatient Prescriptions on File Prior to Visit  Medication Sig Dispense Refill  . Calcium Carbonate-Vitamin D (CALCIUM-VITAMIN D) 500-200 MG-UNIT tablet Take 1 tablet by mouth every evening.     . clopidogrel (PLAVIX) 75 MG tablet Take 1 tablet (75 mg total) by mouth daily. 30 tablet 11  . CRESTOR 20 MG tablet TAKE ONE TABLET BY MOUTH EVERY DAY 90 each 3  . Multiple Vitamins-Minerals (MULTIVITAMIN WITH MINERALS) tablet Take 1 tablet by mouth daily.    . [DISCONTINUED] gabapentin (NEURONTIN) 600 MG tablet Take 1 tablet (600 mg total) by mouth 3 (three) times daily as needed. 90 tablet 5   No current facility-administered medications on file prior to visit.     ROS ROS otherwise unremarkable unless listed above.  Physical Examination: BP (!) 152/84 (BP Location: Right Arm, Cuff Size: Normal)   Pulse 63   Temp 97.9 F (36.6 C) (Oral)   Resp 16   Ht 5' 5.5" (1.664 m)   Wt 157 lb 6.4 oz (71.4 kg)   SpO2 99%   BMI 25.79 kg/m  Ideal Body Weight: Weight in (lb) to have BMI = 25: 152.2  Physical Exam  Constitutional: She is oriented to person, place, and time. She appears well-developed and well-nourished. No distress.  HENT:  Head: Normocephalic and atraumatic.  Right Ear: External ear normal.  Left Ear: External ear normal.  Eyes: Conjunctivae and EOM are normal. Pupils are equal, round, and reactive to light.  Cardiovascular: Normal rate and regular rhythm.  Exam reveals no gallop and no friction rub.   No murmur heard. Pulmonary/Chest: Effort normal. No respiratory distress.  Neurological: She is alert and oriented to person, place, and time. She displays a negative Romberg sign.  Facial nerves intact.   Normal strength.   Ataxic gait.      Skin: Skin is warm and dry. Capillary refill takes less than 2 seconds. She is not diaphoretic.  Psychiatric: She has a normal mood and affect. Her behavior is normal.   Blood glucose: 88  Assessment and Plan: [name] is a 76 y.o. female who is here today for cc of dizziness, lip tightness, and weakness. Hx of tia, and elevated bloodpressure, she should have this evaluated.  Ems obtained.  Iv placed.  Blood pressure elevated without history of HTN - Plan: EKG 12-Lead  Facial weakness  Hx of transient ischemic attack (TIA)  Ataxic gait  Trena Platt, PA-C [facility] 6/12/20181:55 PM

## 2017-01-08 NOTE — ED Provider Notes (Signed)
[facility] DEPT Provider Note   CSN: [id number] Arrival date & time: 01/08/17  1200     History   Chief Complaint Chief Complaint  Patient presents with  . Gait Problem    HPI [name] is a 76 y.o. female.  HPI 76 year old female presents today with fatigue and gait instability since yesterday. She reports that it feels like her lower extremities are weak. Patient also noted she had bilateral hand swelling yesterday which has subsequently subsided. She also had some lower lip swelling and numbness yesterday that is also subsided. No additional aggravating or alleviating factors. No other physical complaints. Went to urgent care who sent her here for further workup.    Past Medical History:  Diagnosis Date  . Acute sinus infection 01/15/2011  . ANXIETY 10/03/2009  . BARRETTS ESOPHAGUS 10/03/2009  . CAROTID ARTERY DISEASE 08/31/2009  . COLONIC POLYPS, HX OF 08/29/2009  . CONSTIPATION, CHRONIC 10/03/2009  . DEPRESSION 10/03/2009  . [place] DISEASE, CERVICAL 08/29/2009  . [place] DISEASE, LUMBAR 08/29/2009  . Eustachian tube dysfunction 01/15/2011  . GERD 08/29/2009  . HYPERLIPIDEMIA 08/29/2009  . Impaired glucose tolerance 01/14/2011  . LEG PAIN, BILATERAL 08/29/2009  . LOW BACK PAIN, CHRONIC 11/22/2009  . PERIPHERAL NEUROPATHY 10/03/2009  . PERIPHERAL VASCULAR DISEASE 08/29/2009  . POSITIVE PPD 10/03/2009  . Preventative health care 01/14/2011  . Stroke ([place])   . Thyroid nodule 08/09/2011  . TIA 11/22/2009  . TRANSIENT ISCHEMIC ATTACK, HX OF 08/29/2009  . URETHRAL STRICTURE 10/03/2009  . VOCAL CORD POLYP, HX OF 10/03/2009  . WRIST PAIN, BILATERAL 05/25/2010    Patient Active Problem List   Diagnosis Date Noted  . Acute left hemiparesis ([place])   . TIA (transient ischemic attack) 06/01/2016  . CVA (cerebral vascular accident) ([place]) 06/01/2016  . Transient cerebral ischemia   . Complicated migraine 09/38/1829  . Multinodular goiter 08/09/2011  . Lumbar pain with radiation down right leg  04/11/2011  . Rash 04/11/2011  . Impaired glucose tolerance 01/14/2011  . Preventative health care 01/14/2011  . WRIST PAIN, BILATERAL 05/25/2010  . TIA 11/22/2009  . LOW BACK PAIN, CHRONIC 11/22/2009  . ANXIETY 10/03/2009  . DEPRESSION 10/03/2009  . PERIPHERAL NEUROPATHY 10/03/2009  . BARRETTS ESOPHAGUS 10/03/2009  . CONSTIPATION, CHRONIC 10/03/2009  . URETHRAL STRICTURE 10/03/2009  . POSITIVE PPD 10/03/2009  . VOCAL CORD POLYP, HX OF 10/03/2009  . Carotid artery disease ([place]) 08/31/2009  . Hyperlipemia 08/29/2009  . PERIPHERAL VASCULAR DISEASE 08/29/2009  . GERD 08/29/2009  . [place] DISEASE, CERVICAL 08/29/2009  . [place] DISEASE, LUMBAR 08/29/2009  . LEG PAIN, BILATERAL 08/29/2009  . TRANSIENT ISCHEMIC ATTACK, HX OF 08/29/2009  . COLONIC POLYPS, HX OF 08/29/2009    Past Surgical History:  Procedure Laterality Date  . hx of right ankle fracture    . s/p vocal cord polyps  1984   chronic hoarseness  . stress test negative  2003,2004,2006  . TUBAL LIGATION    . uterine prolapse      OB History    No data available       Home Medications    Prior to Admission medications   Medication Sig Start Date End Date Taking? Authorizing Provider  aspirin 325 MG EC tablet Take 325 mg by mouth daily.   Yes [provider]  Calcium Carbonate-Vitamin D (CALCIUM-VITAMIN D) 500-200 MG-UNIT tablet Take 1 tablet by mouth every evening.    Yes [provider]  clopidogrel (PLAVIX) 75 MG tablet Take 1 tablet (75  mg total) by mouth daily. 11/06/16  Yes Garvin Fila, MD  Multiple Vitamins-Minerals (MULTIVITAMIN WITH MINERALS) tablet Take 1 tablet by mouth daily.   Yes [provider]  Omega-3 Fatty Acids (FISH OIL) 1000 MG CAPS Take 1,000 mg by mouth daily.    Yes [provider]  pravastatin (PRAVACHOL) 20 MG tablet Take 20 mg by mouth daily.   Yes [provider]  CRESTOR 20 MG tablet TAKE ONE TABLET BY MOUTH EVERY DAY Patient not taking:  Reported on 01/08/2017 11/14/10   Biagio Borg, MD    Family History Family History  Problem Relation Age of Onset  . Stroke Mother   . Heart disease Mother   . Hypertension Mother   . Diabetes Mother   . Multiple myeloma Sister   . Cancer Other        lung    Social History Social History  Substance Use Topics  . Smoking status: Never Smoker  . Smokeless tobacco: Never Used  . Alcohol use No     Allergies   Atorvastatin and Penicillins   Review of Systems Review of Systems All other systems are reviewed and are negative for acute change except as noted in the HPI   Physical Exam Updated Vital Signs BP (!) 150/76 (BP Location: Left Arm)   Pulse (!) 52   Temp 98.3 F (36.8 C) (Oral)   Resp 14   Ht 5' 5.5" (1.664 m)   Wt 71.2 kg (157 lb)   SpO2 99%   BMI 25.73 kg/m   Physical Exam  Constitutional: She is oriented to person, place, and time. She appears well-developed and well-nourished. No distress.  HENT:  Head: Normocephalic and atraumatic.  Nose: Nose normal.  Eyes: Conjunctivae and EOM are normal. Pupils are equal, round, and reactive to light. Right eye exhibits no discharge. Left eye exhibits no discharge. No scleral icterus.  Neck: Normal range of motion. Neck supple.  Cardiovascular: Normal rate and regular rhythm.  Exam reveals no gallop and no friction rub.   No murmur heard. Pulmonary/Chest: Effort normal and breath sounds normal. No stridor. No respiratory distress. She has no rales.  Abdominal: Soft. She exhibits no distension. There is no tenderness.  Musculoskeletal: She exhibits no edema or tenderness.  Neurological: She is alert and oriented to person, place, and time.  Mental Status: Alert and oriented to person, place, and time. Attention and concentration normal. Speech clear. Recent memory is intact  Cranial Nerves  II Visual Fields: Intact to confrontation. Visual fields intact. III, IV, VI: Pupils equal and reactive to light and near.  Full eye movement without nystagmus  V Facial Sensation: Normal. No weakness of masticatory muscles  VII: No facial weakness or asymmetry  VIII Auditory Acuity: Grossly normal  IX/X: The uvula is midline; the palate elevates symmetrically  XI: Normal sternocleidomastoid and trapezius strength  XII: The tongue is midline. No atrophy or fasciculations.   Motor System: Muscle Strength: 5/5 and symmetric in the upper and lower extremities. No pronation or drift.  Muscle Tone: Tone and muscle bulk are normal in the upper and lower extremities.   Reflexes: DTRs: 1+ and symmetrical in all four extremities. Plantar responses are flexor bilaterally.  Coordination: Intact finger-to-nose, heel-to-shin. No tremor.  Sensation: Intact to light touch, and pinprick. Negative Romberg test.  Gait: Routine gait normal.    Skin: Skin is warm and dry. No rash noted. She is not diaphoretic. No erythema.  Psychiatric: She has a normal  mood and affect.  Vitals reviewed.    ED Treatments / Results  Labs (all labs ordered are listed, but only abnormal results are displayed) Labs Reviewed  CBC WITH DIFFERENTIAL/PLATELET - Abnormal; Notable for the following:       Result Value   WBC 3.6 (*)    Hemoglobin 15.5 (*)    HCT 47.5 (*)    Platelets 137 (*)    Neutro Abs 1.4 (*)    All other components within normal limits  BASIC METABOLIC PANEL    EKG  EKG Interpretation None       Radiology No results found.  Procedures Procedures (including critical care time)  Medications Ordered in ED Medications - No data to display   Initial Impression / Assessment and Plan / ED Course  I have reviewed the triage vital signs and the nursing notes.  Pertinent labs & imaging results that were available during my care of the patient were reviewed by me and considered in my medical decision making (see chart for details).     Patient is well-appearing, well-hydrated, nontoxic. Exam is nonfocal. Low  suspicion for CVA episode given the fact that her symptoms are bilateral. Don't feel that advanced imaging is warranted at this time.   Patient was able to ambulate without complication. Screening labs without any electrolyte derangements. CBC was hemoconcentrated likely secondary to dehydration. Patient was provided with IV fluids. Reported feeling that her fatigue had improved.   The patient is safe for discharge with strict return precautions.  Final Clinical Impressions(s) / ED Diagnoses   Final diagnoses:  Other fatigue  Gait disturbance   Disposition: Discharge  Condition: Good  I have discussed the results, Dx and Tx plan with the patient who expressed understanding and agree(s) with the plan. Discharge instructions discussed at great length. The patient was given strict return precautions who verbalized understanding of the instructions. No further questions at time of discharge.    New Prescriptions   No medications on file    Follow Up: [facility], [address] [place] [address] [phone]  Schedule an appointment as soon as possible for a visit  in 5-7 days, If symptoms do not improve or  worsen      Jakeline Dave, Grayce Sessions, MD 01/08/17 1548

## 2017-01-22 DIAGNOSIS — R2231 Localized swelling, mass and lump, right upper limb: Secondary | ICD-10-CM | POA: Diagnosis not present

## 2017-01-22 DIAGNOSIS — R7309 Other abnormal glucose: Secondary | ICD-10-CM | POA: Diagnosis not present

## 2017-01-22 DIAGNOSIS — R260 Ataxic gait: Secondary | ICD-10-CM | POA: Diagnosis not present

## 2017-01-22 DIAGNOSIS — M65331 Trigger finger, right middle finger: Secondary | ICD-10-CM | POA: Diagnosis not present

## 2017-01-22 DIAGNOSIS — R030 Elevated blood-pressure reading, without diagnosis of hypertension: Secondary | ICD-10-CM | POA: Diagnosis not present

## 2017-02-04 ENCOUNTER — Ambulatory Visit: Payer: PPO | Attending: Internal Medicine | Admitting: Physical Therapy

## 2017-02-04 DIAGNOSIS — R296 Repeated falls: Secondary | ICD-10-CM | POA: Insufficient documentation

## 2017-02-04 DIAGNOSIS — R262 Difficulty in walking, not elsewhere classified: Secondary | ICD-10-CM | POA: Diagnosis not present

## 2017-02-04 DIAGNOSIS — M6281 Muscle weakness (generalized): Secondary | ICD-10-CM | POA: Insufficient documentation

## 2017-02-04 NOTE — Patient Instructions (Signed)
   BRIDGING  While lying on your back, tighten your lower abdominals, squeeze your buttocks and then raise your buttocks off the floor/bed as creating a "Bridge" with your body. Hold 3 seconds and then lower yourself and repeat. 2 sets of 10 reps  Abduction: Clam (Eccentric) - Side-Lying    Lie on side with knees bent. Lift top knee, keeping feet together. Keep trunk steady. Slowly lower for 3-5 seconds. __10_ reps per set, _2__ sets per day..  http://ecce.exer.us/65   Copyright  VHI. All rights reserved.         TANDEM STANCE BALANCE  Stand near counter and balnace in tandem stance. Hold this position. Relax and repeat.  Do 1 min with right foot in back, 1 min with left foot in back

## 2017-02-04 NOTE — Therapy (Signed)
sit to stand 13 seconds.  Pt has LE weakness bilaterally right >left.  She has difficulty with coordinating breathing with exercises.  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    History and Personal Factors relevant to plan of care: history of stroke and TIAx2, history of repeated falls   Clinical Presentation Evolving   Clinical Presentation due to: balance and gait worsened with most recent TIA   Clinical Decision Making Low   Rehab Potential Good   PT Frequency 1x / week   PT Duration 8 weeks   PT Treatment/Interventions ADLs/Self Care Home Management;Biofeedback;Cryotherapy;Radiation protection practitionerlectrical Stimulation;Moist Heat;Ultrasound;Gait training;Stair training;Therapeutic activities;Therapeutic exercise;Balance training;Neuromuscular re-education;Patient/family education;Manual  techniques;Taping   PT Next Visit Plan balance and LE strength, posture, breathing for reducing stress and improved core strength   Recommended Other Services n/a   Consulted and Agree with Plan of Care Patient      Patient will benefit from skilled therapeutic intervention in order to improve the following deficits and impairments:  Abnormal gait, Decreased balance, Decreased strength, Increased edema, Postural dysfunction  Visit Diagnosis: Difficulty in walking, not elsewhere classified - Plan: PT plan of care cert/re-cert  Muscle weakness (generalized) - Plan: PT plan of care cert/re-cert  Repeated falls - Plan: PT plan of care cert/re-cert      G-Codes - 02/04/17 1220    Functional Assessment Tool Used (Outpatient Only) Berg balance assessment and clinical reasoning   Functional Limitation Mobility: Walking and moving around   Mobility: Walking and Moving Around Current Status [phone]([phone]) At least 1 percent but less than 20 percent impaired, limited or restricted   Mobility: Walking and Moving Around Goal Status [phone]([id number]) At least 1 percent but less than 20 percent impaired, limited or restricted       Problem List Patient Active Problem List   Diagnosis Date Noted  . Acute left hemiparesis ([facility])   . TIA (transient ischemic attack) 06/01/2016  . CVA (cerebral vascular accident) ([facility]) 06/01/2016  . Transient cerebral ischemia   . Complicated migraine 08/22/2014  . Multinodular goiter 08/09/2011  . Lumbar pain with radiation down right leg 04/11/2011  . Rash 04/11/2011  . Impaired glucose tolerance 01/14/2011  . Preventative health care 01/14/2011  . WRIST PAIN, BILATERAL 05/25/2010  . TIA 11/22/2009  . LOW BACK PAIN, CHRONIC 11/22/2009  . ANXIETY 10/03/2009  . DEPRESSION 10/03/2009  . PERIPHERAL NEUROPATHY 10/03/2009  . BARRETTS ESOPHAGUS 10/03/2009  . CONSTIPATION, CHRONIC 10/03/2009  . URETHRAL STRICTURE 10/03/2009  . POSITIVE PPD 10/03/2009  . VOCAL CORD POLYP, HX OF  10/03/2009  . Carotid artery disease ([facility]) 08/31/2009  . Hyperlipemia 08/29/2009  . PERIPHERAL VASCULAR DISEASE 08/29/2009  . GERD 08/29/2009  . DISC DISEASE, CERVICAL 08/29/2009  . DISC DISEASE, LUMBAR 08/29/2009  . LEG PAIN, BILATERAL 08/29/2009  . TRANSIENT ISCHEMIC ATTACK, HX OF 08/29/2009  . COLONIC POLYPS, HX OF 08/29/2009    Vincente PoliJakki Crosser, PT 02/04/2017, 12:24 PM  [facility] [address] [place], [place], [address] Phone: [phone]   Fax:  [phone]  Name: Sonya DammeMary L Peck MRN: [id number] Date of Birth: 05-Aug-1940  sit to stand 13 seconds.  Pt has LE weakness bilaterally right >left.  She has difficulty with coordinating breathing with exercises.  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    History and Personal Factors relevant to plan of care: history of stroke and TIAx2, history of repeated falls   Clinical Presentation Evolving   Clinical Presentation due to: balance and gait worsened with most recent TIA   Clinical Decision Making Low   Rehab Potential Good   PT Frequency 1x / week   PT Duration 8 weeks   PT Treatment/Interventions ADLs/Self Care Home Management;Biofeedback;Cryotherapy;Radiation protection practitionerlectrical Stimulation;Moist Heat;Ultrasound;Gait training;Stair training;Therapeutic activities;Therapeutic exercise;Balance training;Neuromuscular re-education;Patient/family education;Manual  techniques;Taping   PT Next Visit Plan balance and LE strength, posture, breathing for reducing stress and improved core strength   Recommended Other Services n/a   Consulted and Agree with Plan of Care Patient      Patient will benefit from skilled therapeutic intervention in order to improve the following deficits and impairments:  Abnormal gait, Decreased balance, Decreased strength, Increased edema, Postural dysfunction  Visit Diagnosis: Difficulty in walking, not elsewhere classified - Plan: PT plan of care cert/re-cert  Muscle weakness (generalized) - Plan: PT plan of care cert/re-cert  Repeated falls - Plan: PT plan of care cert/re-cert      G-Codes - 02/04/17 1220    Functional Assessment Tool Used (Outpatient Only) Berg balance assessment and clinical reasoning   Functional Limitation Mobility: Walking and moving around   Mobility: Walking and Moving Around Current Status [phone]([phone]) At least 1 percent but less than 20 percent impaired, limited or restricted   Mobility: Walking and Moving Around Goal Status [phone]([id number]) At least 1 percent but less than 20 percent impaired, limited or restricted       Problem List Patient Active Problem List   Diagnosis Date Noted  . Acute left hemiparesis ([facility])   . TIA (transient ischemic attack) 06/01/2016  . CVA (cerebral vascular accident) ([facility]) 06/01/2016  . Transient cerebral ischemia   . Complicated migraine 08/22/2014  . Multinodular goiter 08/09/2011  . Lumbar pain with radiation down right leg 04/11/2011  . Rash 04/11/2011  . Impaired glucose tolerance 01/14/2011  . Preventative health care 01/14/2011  . WRIST PAIN, BILATERAL 05/25/2010  . TIA 11/22/2009  . LOW BACK PAIN, CHRONIC 11/22/2009  . ANXIETY 10/03/2009  . DEPRESSION 10/03/2009  . PERIPHERAL NEUROPATHY 10/03/2009  . BARRETTS ESOPHAGUS 10/03/2009  . CONSTIPATION, CHRONIC 10/03/2009  . URETHRAL STRICTURE 10/03/2009  . POSITIVE PPD 10/03/2009  . VOCAL CORD POLYP, HX OF  10/03/2009  . Carotid artery disease ([facility]) 08/31/2009  . Hyperlipemia 08/29/2009  . PERIPHERAL VASCULAR DISEASE 08/29/2009  . GERD 08/29/2009  . DISC DISEASE, CERVICAL 08/29/2009  . DISC DISEASE, LUMBAR 08/29/2009  . LEG PAIN, BILATERAL 08/29/2009  . TRANSIENT ISCHEMIC ATTACK, HX OF 08/29/2009  . COLONIC POLYPS, HX OF 08/29/2009    Vincente PoliJakki Crosser, PT 02/04/2017, 12:24 PM  [facility] [address] [place], [place], [address] Phone: [phone]   Fax:  [phone]  Name: Sonya DammeMary L Peck MRN: [id number] Date of Birth: 05-Aug-1940  [facility] [address] [place], [place], [address] Phone: [phone]   Fax:  [phone]  Physical Therapy Evaluation  Patient Details  Name: [name] MRN: [id number] Date of Birth: June 07, 1941 Referring Provider: Dorothyann Peng   Encounter Date: 02/04/2017      PT End of Session - 02/04/17 1144    Visit Number 1   Number of Visits 10   Date for PT Re-Evaluation 04/01/17   Authorization Type medicare gcodes; KX 15th visit   PT Start Time 1057   PT Stop Time 1143   PT Time Calculation (min) 46 min   Activity Tolerance Patient tolerated treatment well   Behavior During Therapy [facility] for tasks assessed/performed      Past Medical History:  Diagnosis Date  . Acute sinus infection 01/15/2011  . ANXIETY 10/03/2009  . BARRETTS ESOPHAGUS 10/03/2009  . CAROTID ARTERY DISEASE 08/31/2009  . COLONIC POLYPS, HX OF 08/29/2009  . CONSTIPATION, CHRONIC 10/03/2009  . DEPRESSION 10/03/2009  . DISC DISEASE, CERVICAL 08/29/2009  . DISC DISEASE, LUMBAR 08/29/2009  . Eustachian tube dysfunction 01/15/2011  . GERD 08/29/2009  . HYPERLIPIDEMIA 08/29/2009  . Impaired glucose tolerance 01/14/2011  . LEG PAIN, BILATERAL 08/29/2009  . LOW BACK PAIN, CHRONIC 11/22/2009  . PERIPHERAL NEUROPATHY 10/03/2009  . PERIPHERAL VASCULAR DISEASE 08/29/2009  . POSITIVE PPD 10/03/2009  . Preventative health care 01/14/2011  . Stroke ([facility])   . Thyroid nodule 08/09/2011  . TIA 11/22/2009  . TRANSIENT ISCHEMIC ATTACK, HX OF 08/29/2009  . URETHRAL STRICTURE 10/03/2009  . VOCAL CORD POLYP, HX OF 10/03/2009  . WRIST PAIN, BILATERAL 05/25/2010    Past Surgical History:  Procedure Laterality Date  . hx of right ankle fracture    . s/p vocal cord polyps  1984   chronic hoarseness  . stress test negative  2003,2004,2006  . TUBAL LIGATION    . uterine prolapse      There were no vitals filed for this visit.       Subjective Assessment - 02/04/17 1059    Subjective Everything is on the right, weakness and ankle and hand on right swells sometimes.  Had a 3rd degree ankle sprain 25 years ago.  Fell last Tuesday in the middle of the night and is falling off the bed.  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  Over the last 2 years I went from walking 3 mile/day to only about 2 blocks now.   Limitations Walking;Standing   How long can you stand comfortably? 2 hours   How long can you walk comfortably? 2 blocks   Patient Stated Goals strengthen myself so I can walk   Currently in Pain? No/denies            [facility] PT Assessment - 02/04/17 0001      Assessment   Medical Diagnosis R26.0 (ICD-10-CM) - Ataxic gait   Referring Provider SANDERS, ROBYN    Onset Date/Surgical Date 06/07/16   Prior Therapy No     Precautions   Precautions Fall     Restrictions   Weight Bearing Restrictions No     Balance Screen   Has the patient fallen in the past 6 months Yes   How many times? 2-3  last Tuesday (has been falling out of bed)   Has the patient had a decrease in activity level  sit to stand 13 seconds.  Pt has LE weakness bilaterally right >left.  She has difficulty with coordinating breathing with exercises.  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    History and Personal Factors relevant to plan of care: history of stroke and TIAx2, history of repeated falls   Clinical Presentation Evolving   Clinical Presentation due to: balance and gait worsened with most recent TIA   Clinical Decision Making Low   Rehab Potential Good   PT Frequency 1x / week   PT Duration 8 weeks   PT Treatment/Interventions ADLs/Self Care Home Management;Biofeedback;Cryotherapy;Radiation protection practitionerlectrical Stimulation;Moist Heat;Ultrasound;Gait training;Stair training;Therapeutic activities;Therapeutic exercise;Balance training;Neuromuscular re-education;Patient/family education;Manual  techniques;Taping   PT Next Visit Plan balance and LE strength, posture, breathing for reducing stress and improved core strength   Recommended Other Services n/a   Consulted and Agree with Plan of Care Patient      Patient will benefit from skilled therapeutic intervention in order to improve the following deficits and impairments:  Abnormal gait, Decreased balance, Decreased strength, Increased edema, Postural dysfunction  Visit Diagnosis: Difficulty in walking, not elsewhere classified - Plan: PT plan of care cert/re-cert  Muscle weakness (generalized) - Plan: PT plan of care cert/re-cert  Repeated falls - Plan: PT plan of care cert/re-cert      G-Codes - 02/04/17 1220    Functional Assessment Tool Used (Outpatient Only) Berg balance assessment and clinical reasoning   Functional Limitation Mobility: Walking and moving around   Mobility: Walking and Moving Around Current Status [phone]) At least 1 percent but less than 20 percent impaired, limited or restricted   Mobility: Walking and Moving Around Goal Status [phone]([phone]) At least 1 percent but less than 20 percent impaired, limited or restricted       Problem List Patient Active Problem List   Diagnosis Date Noted  . Acute left hemiparesis (H[facility])   . TIA (transient ischemic attack) 06/01/2016  . CVA (cerebral vascular accident) ([facility]) 06/01/2016  . Transient cerebral ischemia   . Complicated migraine 08/22/2014  . Multinodular goiter 08/09/2011  . Lumbar pain with radiation down right leg 04/11/2011  . Rash 04/11/2011  . Impaired glucose tolerance 01/14/2011  . Preventative health care 01/14/2011  . WRIST PAIN, BILATERAL 05/25/2010  . TIA 11/22/2009  . LOW BACK PAIN, CHRONIC 11/22/2009  . ANXIETY 10/03/2009  . DEPRESSION 10/03/2009  . PERIPHERAL NEUROPATHY 10/03/2009  . BARRETTS ESOPHAGUS 10/03/2009  . CONSTIPATION, CHRONIC 10/03/2009  . URETHRAL STRICTURE 10/03/2009  . POSITIVE PPD 10/03/2009  . VOCAL CORD POLYP, HX OF  10/03/2009  . Carotid artery disease ([facility]) 08/31/2009  . Hyperlipemia 08/29/2009  . PERIPHERAL VASCULAR DISEASE 08/29/2009  . GERD 08/29/2009  . DISC DISEASE, CERVICAL 08/29/2009  . DISC DISEASE, LUMBAR 08/29/2009  . LEG PAIN, BILATERAL 08/29/2009  . TRANSIENT ISCHEMIC ATTACK, HX OF 08/29/2009  . COLONIC POLYPS, HX OF 08/29/2009    Vincente PoliJakki Crosser, PT 02/04/2017, 12:24 PM  [facility] [address] [place], [place], [address] Phone: [phone]   Fax:  [phone]  Name: Sonya DammeMary L Peck MRN: [id number] Date of Birth: 05-Aug-1940

## 2017-02-11 ENCOUNTER — Encounter: Payer: Self-pay | Admitting: Nurse Practitioner

## 2017-02-11 ENCOUNTER — Ambulatory Visit (INDEPENDENT_AMBULATORY_CARE_PROVIDER_SITE_OTHER): Payer: PPO | Admitting: Nurse Practitioner

## 2017-02-11 VITALS — BP 130/70 | HR 51 | Wt 153.4 lb

## 2017-02-11 DIAGNOSIS — E785 Hyperlipidemia, unspecified: Secondary | ICD-10-CM

## 2017-02-11 DIAGNOSIS — G451 Carotid artery syndrome (hemispheric): Secondary | ICD-10-CM | POA: Diagnosis not present

## 2017-02-11 NOTE — Progress Notes (Signed)
[facility]  PATIENT: [name] DOB: 07/12/1941   REASON FOR VISIT: Follow-up for TIA HISTORY FROM: Patient    HISTORY OF PRESENT ILLNESS:UPDATE 07/16/2018CM [name], 76 year old female returns for follow-up with a history of TIA and sensory loss on 06/01/2016 She was asked to continue Plavix and stop aspirin at her last visit with [name] however she has continued both. Minimal bruising is noted but no bleeding. She has not had further stroke or TIA symptoms. She remains on pravastatin without complaints of myalgias. She had an ER visit on 01/08/2017 for dehydration. She is currently been evaluated by physical therapist for intermittent right leg weakness and leaning to the right. She denies any falls. She was taking care of her husband who has Alzheimer's disease  he is now been placed in a skilled facility. She used to walk 3 miles a day according to the patient and she had much less stress and other issues when she was more active. She remains independent in all activities of daily living. She continues to drive without difficulty. She returns for reevaluation  HISTORY 11/06/16 PSMs [name] is a pleasant 76 year old African-American lady who had a transient episode of left hemiparesis and sensory loss on 06/01/16. She states she is driving her husband when he commented that she was leaving all through the lanes. She noticed that her left leg was heavy. She was able to go to the barber shop where they were going and felt all right. But when they finished and she was driving back she noticed that her left leg again became heavy and weak. She drove instead to [facility] where she was evaluated. Her symptoms improved shop shortly after arrival. She did feel drawing of the left side of the face as well as numbness and weakness of left arm and leg. MRI scan of the brain was obtained which I personally reviewed showed no acute abnormality. MRI of the brain  showed no large vessel occlusion. Carotid ultrasound showed no significant extracranial stenosis. Pounds resting echo showed normal ejection fraction. Patient was on aspirin 81 mg which was transiently 25 mg daily which is tolerating well without any stomach side effects of bruising or bleeding. The patient has not had any recurrent stroke or TIA symptoms. She was on Crestor 20 mg daily but recently her cardiologist Dr. Jacinto Peck increased it to 40 mg daily. The patient complains of intermittent right leg weakness and leaning to the right. [name] has ordered a lower extremity arterial Doppler to be performed. She does give a history of remote back pain having epidural steroid injections in her back. She does see orthopedic physician. She denies radicular pain but does complain of pain in her leg at night. She has a remote history of possible TIA 5 years ago but she is vague about the symptoms and feels her primary physician who evaluated her felt it may have been stress related. She states she does not have high blood pressure. She is a healthy eater and she is quite active. 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 She is independent in activities of daily living.   REVIEW OF SYSTEMS: Full 14 system review of systems performed and notable only for those listed, all others are neg:  Constitutional:Fatigue Cardiovascular : neg Ear/Nose/Throat: neg  Skin: neg Eyes: neg Respiratory: neg Gastroitestinal: neg  Hematology/Lymphatic: neg  Endocrine: neg MusculoskeletaJoint swelling walking difficulty Allergy/Immunology: neg Neurological: neg Psychiatric: neg Sleep : neg  ALLERGIES: Allergies  Allergen Reactions  . Atorvastatin     REACTION: myalgia  . Penicillins Hives and Itching    Has patient had a PCN reaction causing immediate rash, facial/tongue/throat swelling, SOB or lightheadedness with hypotension: no Has patient had a PCN reaction causing severe rash  involving mucus membranes or skin necrosis: no Has patient had a PCN reaction that required hospitalization : no Has patient had a PCN reaction occurring within the last 10 years: no If all of the above answers are "NO", then may proceed with Cephalosporin use.     HOME MEDICATIONS: Outpatient Medications Prior to Visit  Medication Sig Dispense Refill  . aspirin 325 MG EC tablet Take 325 mg by mouth daily.    . Calcium Carbonate-Vitamin D (CALCIUM-VITAMIN D) 500-200 MG-UNIT tablet Take 1 tablet by mouth every evening.     . clopidogrel (PLAVIX) 75 MG tablet Take 1 tablet (75 mg total) by mouth daily. 30 tablet 11  . CRESTOR 20 MG tablet TAKE ONE TABLET BY MOUTH EVERY DAY 90 each 3  . Multiple Vitamins-Minerals (MULTIVITAMIN WITH MINERALS) tablet Take 1 tablet by mouth daily.    . Omega-3 Fatty Acids (FISH OIL) 1000 MG CAPS Take 1,000 mg by mouth daily.     . pravastatin (PRAVACHOL) 20 MG tablet Take 20 mg by mouth daily.     No facility-administered medications prior to visit.     PAST MEDICAL HISTORY: Past Medical History:  Diagnosis Date  . Acute sinus infection 01/15/2011  . ANXIETY 10/03/2009  . BARRETTS ESOPHAGUS 10/03/2009  . CAROTID ARTERY DISEASE 08/31/2009  . COLONIC POLYPS, HX OF 08/29/2009  . CONSTIPATION, CHRONIC 10/03/2009  . DEPRESSION 10/03/2009  . DISC DISEASE, CERVICAL 08/29/2009  . DISC DISEASE, LUMBAR 08/29/2009  . Eustachian tube dysfunction 01/15/2011  . GERD 08/29/2009  . HYPERLIPIDEMIA 08/29/2009  . Impaired glucose tolerance 01/14/2011  . LEG PAIN, BILATERAL 08/29/2009  . LOW BACK PAIN, CHRONIC 11/22/2009  . PERIPHERAL NEUROPATHY 10/03/2009  . PERIPHERAL VASCULAR DISEASE 08/29/2009  . POSITIVE PPD 10/03/2009  . Preventative health care 01/14/2011  . Stroke (HCC)   . Thyroid nodule 08/09/2011  . TIA 11/22/2009  . TRANSIENT ISCHEMIC ATTACK, HX OF 08/29/2009  . URETHRAL STRICTURE 10/03/2009  . VOCAL CORD POLYP, HX OF 10/03/2009  . WRIST PAIN, BILATERAL 05/25/2010    PAST  SURGICAL HISTORY: Past Surgical History:  Procedure Laterality Date  . hx of right ankle fracture    . s/p vocal cord polyps  1984   chronic hoarseness  . stress test negative  2003,2004,2006  . TUBAL LIGATION    . uterine prolapse      FAMILY HISTORY: Family History  Problem Relation Age of Onset  . Stroke Mother   . Heart disease Mother   . Hypertension Mother   . Diabetes Mother   . Multiple myeloma Sister   . Cancer Other        lung    SOCIAL HISTORY: Social History   Social History  . Marital status: Married    Spouse name: N/A  . Number of children: 3  . Years of education: N/A   Occupational History  . 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 Smoking status: Never Smoker  . Smokeless tobacco: Never Used  . Alcohol use No  . Drug use: No  . Sexual activity: Not on file   Other Topics Concern  . Not on file   Social History Narrative  Husband with dementia     PHYSICAL EXAM  Vitals:   02/11/17 1235  BP: (!) 130/70   Pulse: (!) 51  Weight: 153 lb 6.4 oz (69.6 kg)   Body mass index is 25.14 kg/m.  Generalized: Well developed, in no acute distress  Head: normocephalic and atraumatic,. Oropharynx benign  Neck: Supple, no carotid bruits  Cardiac: Regular rate rhythm, no murmur  Musculoskeletal: No deformity   Neurological examination   Mentation: Alert oriented to time, place, history taking. Attention span and concentration appropriate. Recent and remote memory intact.  Follows all commands speech and language fluent.   Cranial nerve II-XII:Pupils were equal round reactive to light extraocular movements were full, visual field were full on confrontational test. Facial sensation and strength were normal. hearing was intact to finger rubbing bilaterally. Uvula tongue midline. head turning and shoulder shrug were normal and symmetric.Tongue protrusion into cheek strength was normal. Motor: normal bulk and tone,  full strength in the BUE, BLE, fine finger movements normal, no pronator drift. No focal weakness Sensory: normal and symmetric to light touch,  Coordination: finger-nose-finger, heel-to-shin bilaterally, no dysmetria Reflexes1+ upper lower and symmetric plantar responses were flexor bilaterally. Gait and Station: Rising up from seated position without assistance, normal stance,  moderate stride, good arm swing, smooth turning, able to perform tiptoe, and heel walking without difficulty. Tandem gait is steady  DIAGNOSTIC DATA (LABS, IMAGING, TESTING) - I reviewed patient records, labs, notes, testing and imaging myself where available.  Lab Results  Component Value Date   WBC 3.6 (L) 01/08/2017   HGB 15.5 (H) 01/08/2017   HCT 47.5 (H) 01/08/2017   MCV 96.2 01/08/2017   PLT 137 (L) 01/08/2017      Component Value Date/Time   NA 142 01/08/2017 1355   K 3.8 01/08/2017 1355   CL 107 01/08/2017 1355   CO2 27 01/08/2017 1355   GLUCOSE 94 01/08/2017 1355   BUN 7 01/08/2017 1355   CREATININE 0.72 01/08/2017 1355   CALCIUM 9.7 01/08/2017 1355   PROT 7.1 06/01/2016 1827   ALBUMIN 4.3 06/01/2016 1827   AST 29 06/01/2016 1827   ALT 24 06/01/2016 1827   ALKPHOS 57 06/01/2016 1827   BILITOT 1.0 06/01/2016 1827   GFRNONAA >60 01/08/2017 1355   GFRAA >60 01/08/2017 1355   Lab Results  Component Value Date   CHOL 235 (H) 08/22/2014   HDL 42 08/22/2014   LDLCALC 176 (H) 08/22/2014   LDLDIRECT 194.5 05/25/2010   TRIG 85 08/22/2014   CHOLHDL 5.6 08/22/2014   Lab Results  Component Value Date   HGBA1C 5.9 (H) 08/22/2014      ASSESSMENT AND PLAN 64 year lady with right hemispheric TIA in November 2017 likely due to small vessel disease. Intermittent gait instability and right leg weakness unlikely to be related to her cerebrovascular disease and possibly related to peripheral vascular disease versus lumbar spinal stenosis.     PLAN: Continue Plavix 75 mg daily  for secondary stroke  prevention  Stop aspirin maintain strict control of hypertension with blood pressure goal below 130/70,  diabetes with hemoglobin A1c goal below 6.5% Lipids LDL cholesterol goal below 70 mg/dL.Continue Pravachol  I also advised the patient to eat a healthy diet with plenty of whole grains, cereals, fruits and vegetables,  Start PT for gait instability then exercise regularly and maintain ideal body weight  Get back to walking every day . Follow up in 6 months, if stable will discharge  I spent 25  minutes in total face to face time with the patient more than 50% of which was spent counseling and coordination of care, reviewing test results reviewing medications and discussing and reviewing the diagnosis of TIA/stroke  and risk factor management for stroke Nilda Riggs, [facility], [facility], APRN  [facility] [address], [address] [place], [place] [address] [phone]

## 2017-02-11 NOTE — Patient Instructions (Addendum)
Plavix 75 mg daily  for secondary stroke prevention  Stop aspirin maintain strict control of hypertension with blood pressure goal below 130/70,  diabetes with hemoglobin A1c goal below 6.5% and lipids   LDL cholesterol goal below 70 mg/dL.Continue Pravachol  I also advised the patient to eat a healthy diet with plenty of whole grains, cereals, fruits and vegetables,  Start PT for gait instability then exercise regularly and maintain ideal body weight . Follow up in 6 months

## 2017-02-12 NOTE — Progress Notes (Signed)
I agree with the above plan 

## 2017-02-13 ENCOUNTER — Ambulatory Visit: Payer: PPO | Admitting: Physical Therapy

## 2017-02-13 ENCOUNTER — Encounter: Payer: Self-pay | Admitting: Physical Therapy

## 2017-02-13 DIAGNOSIS — R262 Difficulty in walking, not elsewhere classified: Secondary | ICD-10-CM

## 2017-02-13 DIAGNOSIS — R296 Repeated falls: Secondary | ICD-10-CM

## 2017-02-13 DIAGNOSIS — M6281 Muscle weakness (generalized): Secondary | ICD-10-CM

## 2017-02-13 NOTE — Therapy (Signed)
WRIST PAIN, BILATERAL 05/25/2010  . TIA 11/22/2009  . LOW BACK PAIN, CHRONIC 11/22/2009  . ANXIETY 10/03/2009  . DEPRESSION 10/03/2009  . PERIPHERAL NEUROPATHY 10/03/2009  . BARRETTS ESOPHAGUS 10/03/2009  . CONSTIPATION, CHRONIC 10/03/2009  . URETHRAL STRICTURE 10/03/2009  . POSITIVE PPD 10/03/2009  . VOCAL CORD POLYP, HX OF 10/03/2009  . Carotid artery disease (HCC) 08/31/2009  . Hyperlipemia 08/29/2009  . PERIPHERAL VASCULAR DISEASE 08/29/2009  . GERD 08/29/2009  . DISC DISEASE, CERVICAL 08/29/2009  . DISC DISEASE, LUMBAR 08/29/2009  . LEG PAIN, BILATERAL 08/29/2009  . TRANSIENT ISCHEMIC ATTACK, HX OF 08/29/2009  . COLONIC POLYPS, HX OF 08/29/2009    Vincente Poli, PT 02/13/2017, 4:17 PM  [facility] [address], [address] [place], [place], [address] Phone: [phone]   Fax:  [phone]  Name: [name] MRN: [id number] Date of Birth: August 11, 1940  [facility] [address], [address] [place], [place], [address] Phone: [phone]   Fax:  [phone]  Physical Therapy Treatment  Patient Details  Name: KYNSLEE BAHAM MRN: [id number] Date of Birth: [birth year] Referring Provider: Dorothyann Peng   Encounter Date: 02/13/2017      PT End of Session - 02/13/17 1542    Visit Number 2   Number of Visits 10   Date for PT Re-Evaluation 04/01/17   Authorization Type medicare gcodes; KX 15th visit   PT Start Time 1542  pt arrived 12 minutes late   PT Stop Time 1614   PT Time Calculation (min) 32 min   Activity Tolerance Patient tolerated treatment well   Behavior During Therapy [facility] for tasks assessed/performed      Past Medical History:  Diagnosis Date  . Acute sinus infection 01/15/2011  . ANXIETY 10/03/2009  . BARRETTS ESOPHAGUS 10/03/2009  . CAROTID ARTERY DISEASE 08/31/2009  . COLONIC POLYPS, HX OF 08/29/2009  . CONSTIPATION, CHRONIC 10/03/2009  . DEPRESSION 10/03/2009  . DISC DISEASE, CERVICAL 08/29/2009  . DISC DISEASE, LUMBAR 08/29/2009  . Eustachian tube dysfunction 01/15/2011  . GERD 08/29/2009  . HYPERLIPIDEMIA 08/29/2009  . Impaired glucose tolerance 01/14/2011  . LEG PAIN, BILATERAL 08/29/2009  . LOW BACK PAIN, CHRONIC 11/22/2009  . PERIPHERAL NEUROPATHY 10/03/2009  . PERIPHERAL VASCULAR DISEASE 08/29/2009  . POSITIVE PPD 10/03/2009  . Preventative health care 01/14/2011  . Stroke (HCC)   . Thyroid nodule 08/09/2011  . TIA 11/22/2009  . TRANSIENT ISCHEMIC ATTACK, HX OF 08/29/2009  . URETHRAL STRICTURE 10/03/2009  . VOCAL CORD POLYP, HX OF 10/03/2009  . WRIST PAIN, BILATERAL 05/25/2010    Past Surgical History:  Procedure Laterality Date  . hx of right ankle fracture    . s/p vocal cord polyps  1984   chronic hoarseness  . stress test negative  2003,2004,2006  . TUBAL LIGATION    . uterine prolapse      There were no vitals filed for this visit.      Subjective  Assessment - 02/13/17 1546    Subjective Patient reports she was at the doctor's all morning.  Pt states she got a new balance exercise from her doctor today.  States she was unable to do the balance exercises she was given at first visit.   Limitations Walking;Standing   Patient Stated Goals strengthen myself so I can walk   Currently in Pain? No/denies                         [facility] Adult PT Treatment/Exercise - 02/13/17 0001      Neuro Re-ed    Neuro Re-ed Details  tandem standing     Knee/Hip Exercises: Stretches   Active Hamstring Stretch Both;2 reps;30 seconds     Knee/Hip Exercises: Seated   Other Seated Knee/Hip Exercises diaphragmatic breathing with emphasis on keeping shoulders relaxed   Sit to Sand 20 reps;without UE support     Knee/Hip Exercises: Supine   Bridges Strengthening;Both;20 reps     Knee/Hip Exercises: Sidelying   Clams 20x each side                PT Education - 02/13/17 1603    Education provided Yes   Education Details balloon breathing   Person(s) Educated Patient   Methods Explanation;Handout;Verbal cues;Demonstration   Comprehension Verbalized understanding;Returned demonstration          PT Short Term  WRIST PAIN, BILATERAL 05/25/2010  . TIA 11/22/2009  . LOW BACK PAIN, CHRONIC 11/22/2009  . ANXIETY 10/03/2009  . DEPRESSION 10/03/2009  . PERIPHERAL NEUROPATHY 10/03/2009  . BARRETTS ESOPHAGUS 10/03/2009  . CONSTIPATION, CHRONIC 10/03/2009  . URETHRAL STRICTURE 10/03/2009  . POSITIVE PPD 10/03/2009  . VOCAL CORD POLYP, HX OF 10/03/2009  . Carotid artery disease (HCC) 08/31/2009  . Hyperlipemia 08/29/2009  . PERIPHERAL VASCULAR DISEASE 08/29/2009  . GERD 08/29/2009  . DISC DISEASE, CERVICAL 08/29/2009  . DISC DISEASE, LUMBAR 08/29/2009  . LEG PAIN, BILATERAL 08/29/2009  . TRANSIENT ISCHEMIC ATTACK, HX OF 08/29/2009  . COLONIC POLYPS, HX OF 08/29/2009    Vincente Poli, PT 02/13/2017, 4:17 PM  [facility] [address], [address] [place], [place], [address] Phone: [phone]   Fax:  [phone]  Name: [name] MRN: [id number] Date of Birth: August 11, 1940

## 2017-02-13 NOTE — Patient Instructions (Addendum)
Balloon Breath    Focus on not lifting the shoulders.  Place hands LIGHTLY on belly below navel. Imagine a balloon inside belly. Blow up balloon on breath IN, deflate balloon on breath OUT. Contract abdominals slightly to assist breath OUT. Time __3_ minutes.  Copyright  VHI. All rights reserved.

## 2017-02-20 ENCOUNTER — Ambulatory Visit: Payer: PPO

## 2017-02-20 DIAGNOSIS — R262 Difficulty in walking, not elsewhere classified: Secondary | ICD-10-CM | POA: Diagnosis not present

## 2017-02-20 DIAGNOSIS — M6281 Muscle weakness (generalized): Secondary | ICD-10-CM

## 2017-02-20 DIAGNOSIS — R296 Repeated falls: Secondary | ICD-10-CM

## 2017-02-20 NOTE — Therapy (Signed)
[facility] [address], [address] [place], [place], [address] Phone: [phone]   Fax:  [phone]  Physical Therapy Treatment  Patient Details  Name: [name] MRN: [id number] Date of Birth: 1941-04-26 Referring Provider: Dorothyann PengSANDERS, ROBYN   Encounter Date: 02/20/2017      PT End of Session - 02/20/17 1122    Visit Number 3   Number of Visits 10   Date for PT Re-Evaluation 04/01/17   Authorization Type medicare gcodes; KX 15th visit   PT Start Time 1105   PT Stop Time 1140  pt. had to leave early due to schedule with bible study    PT Time Calculation (min) 35 min   Activity Tolerance Patient tolerated treatment well   Behavior During Therapy [facility] for tasks assessed/performed      Past Medical History:  Diagnosis Date  . Acute sinus infection 01/15/2011  . ANXIETY 10/03/2009  . BARRETTS ESOPHAGUS 10/03/2009  . CAROTID ARTERY DISEASE 08/31/2009  . COLONIC POLYPS, HX OF 08/29/2009  . CONSTIPATION, CHRONIC 10/03/2009  . DEPRESSION 10/03/2009  . DISC DISEASE, CERVICAL 08/29/2009  . DISC DISEASE, LUMBAR 08/29/2009  . Eustachian tube dysfunction 01/15/2011  . GERD 08/29/2009  . HYPERLIPIDEMIA 08/29/2009  . Impaired glucose tolerance 01/14/2011  . LEG PAIN, BILATERAL 08/29/2009  . LOW BACK PAIN, CHRONIC 11/22/2009  . PERIPHERAL NEUROPATHY 10/03/2009  . PERIPHERAL VASCULAR DISEASE 08/29/2009  . POSITIVE PPD 10/03/2009  . Preventative health care 01/14/2011  . Stroke (HCC)   . Thyroid nodule 08/09/2011  . TIA 11/22/2009  . TRANSIENT ISCHEMIC ATTACK, HX OF 08/29/2009  . URETHRAL STRICTURE 10/03/2009  . VOCAL CORD POLYP, HX OF 10/03/2009  . WRIST PAIN, BILATERAL 05/25/2010    Past Surgical History:  Procedure Laterality Date  . hx of right ankle fracture    . s/p vocal cord polyps  1984   chronic hoarseness  . stress test negative  2003,2004,2006  . TUBAL LIGATION    . uterine prolapse      There were no vitals filed for this  visit.      Subjective Assessment - 02/20/17 1111    Subjective Pt. noting 2x/[facility] [employer] with [facility].  HEP going well.     Patient Stated Goals strengthen myself so I can walk   Currently in Pain? No/denies   Multiple Pain Sites No                         [facility] Adult PT Treatment/Exercise - 02/20/17 1124      Neuro Re-ed    Neuro Re-ed Details  tandem walk 3 x 40 ft; supervision on R side due to subtle instability     Knee/Hip Exercises: Aerobic   Nustep NuStep: lvl 3, 8 min      Knee/Hip Exercises: Machines for Strengthening   Cybex Knee Flexion B LE 30# x 10 reps; B con/R ecc 25# x 10 reps      Knee/Hip Exercises: Standing   Other Standing Knee Exercises Alternating step onto BOSU ball (up) with reach to ski pole x 20 reps each side      Knee/Hip Exercises: Supine   Bridges Strengthening;Both;20 reps  3" hold    Bridges with Clamshell 20 reps  with green TB at knees and sustained hip abd/ER    Other Supine Knee/Hip Exercises Hooklying alternating clam shell with green TB at knees x 15 reps  Lumbar pain with radiation down right leg 04/11/2011  . Rash 04/11/2011  . Impaired glucose tolerance 01/14/2011  . Preventative health care 01/14/2011  . WRIST PAIN, BILATERAL 05/25/2010  . TIA 11/22/2009  . LOW BACK PAIN, CHRONIC 11/22/2009  . ANXIETY 10/03/2009  . DEPRESSION 10/03/2009  . PERIPHERAL NEUROPATHY 10/03/2009  . BARRETTS ESOPHAGUS 10/03/2009  . CONSTIPATION, CHRONIC 10/03/2009  . URETHRAL STRICTURE 10/03/2009  . POSITIVE PPD 10/03/2009  . VOCAL CORD POLYP, HX OF 10/03/2009  . Carotid artery disease (HCC) 08/31/2009  . Hyperlipemia 08/29/2009  . PERIPHERAL VASCULAR DISEASE 08/29/2009  . GERD 08/29/2009  . DISC DISEASE, CERVICAL 08/29/2009  . DISC DISEASE, LUMBAR 08/29/2009  . LEG PAIN, BILATERAL 08/29/2009  . TRANSIENT ISCHEMIC ATTACK, HX OF 08/29/2009  . COLONIC POLYPS, HX OF 08/29/2009    Kermit Balo, PTA 02/20/17 1:43 PM  [facility] [address] [place], [place], [address] Phone: [phone]   Fax:  [phone]  Name: [name] MRN: [id number] Date of Birth: 1940/11/23  [facility] [address], [address] [place], [place], [address] Phone: [phone]   Fax:  [phone]  Physical Therapy Treatment  Patient Details  Name: [name] MRN: [id number] Date of Birth: 1941-04-26 Referring Provider: Dorothyann PengSANDERS, ROBYN   Encounter Date: 02/20/2017      PT End of Session - 02/20/17 1122    Visit Number 3   Number of Visits 10   Date for PT Re-Evaluation 04/01/17   Authorization Type medicare gcodes; KX 15th visit   PT Start Time 1105   PT Stop Time 1140  pt. had to leave early due to schedule with bible study    PT Time Calculation (min) 35 min   Activity Tolerance Patient tolerated treatment well   Behavior During Therapy [facility] for tasks assessed/performed      Past Medical History:  Diagnosis Date  . Acute sinus infection 01/15/2011  . ANXIETY 10/03/2009  . BARRETTS ESOPHAGUS 10/03/2009  . CAROTID ARTERY DISEASE 08/31/2009  . COLONIC POLYPS, HX OF 08/29/2009  . CONSTIPATION, CHRONIC 10/03/2009  . DEPRESSION 10/03/2009  . DISC DISEASE, CERVICAL 08/29/2009  . DISC DISEASE, LUMBAR 08/29/2009  . Eustachian tube dysfunction 01/15/2011  . GERD 08/29/2009  . HYPERLIPIDEMIA 08/29/2009  . Impaired glucose tolerance 01/14/2011  . LEG PAIN, BILATERAL 08/29/2009  . LOW BACK PAIN, CHRONIC 11/22/2009  . PERIPHERAL NEUROPATHY 10/03/2009  . PERIPHERAL VASCULAR DISEASE 08/29/2009  . POSITIVE PPD 10/03/2009  . Preventative health care 01/14/2011  . Stroke (HCC)   . Thyroid nodule 08/09/2011  . TIA 11/22/2009  . TRANSIENT ISCHEMIC ATTACK, HX OF 08/29/2009  . URETHRAL STRICTURE 10/03/2009  . VOCAL CORD POLYP, HX OF 10/03/2009  . WRIST PAIN, BILATERAL 05/25/2010    Past Surgical History:  Procedure Laterality Date  . hx of right ankle fracture    . s/p vocal cord polyps  1984   chronic hoarseness  . stress test negative  2003,2004,2006  . TUBAL LIGATION    . uterine prolapse      There were no vitals filed for this  visit.      Subjective Assessment - 02/20/17 1111    Subjective Pt. noting 2x/wl[facility] [employer] with [facility].  HEP going well.     Patient Stated Goals strengthen myself so I can walk   Currently in Pain? No/denies   Multiple Pain Sites No                         [facility] Adult PT Treatment/Exercise - 02/20/17 1124      Neuro Re-ed    Neuro Re-ed Details  tandem walk 3 x 40 ft; supervision on R side due to subtle instability     Knee/Hip Exercises: Aerobic   Nustep NuStep: lvl 3, 8 min      Knee/Hip Exercises: Machines for Strengthening   Cybex Knee Flexion B LE 30# x 10 reps; B con/R ecc 25# x 10 reps      Knee/Hip Exercises: Standing   Other Standing Knee Exercises Alternating step onto BOSU ball (up) with reach to ski pole x 20 reps each side      Knee/Hip Exercises: Supine   Bridges Strengthening;Both;20 reps  3" hold    Bridges with Clamshell 20 reps  with green TB at knees and sustained hip abd/ER    Other Supine Knee/Hip Exercises Hooklying alternating clam shell with green TB at knees x 15 reps

## 2017-02-27 ENCOUNTER — Encounter: Payer: Self-pay | Admitting: Physical Therapy

## 2017-02-27 ENCOUNTER — Ambulatory Visit: Payer: PPO | Attending: Internal Medicine | Admitting: Physical Therapy

## 2017-02-27 DIAGNOSIS — R296 Repeated falls: Secondary | ICD-10-CM

## 2017-02-27 DIAGNOSIS — R262 Difficulty in walking, not elsewhere classified: Secondary | ICD-10-CM

## 2017-02-27 DIAGNOSIS — M6281 Muscle weakness (generalized): Secondary | ICD-10-CM

## 2017-02-27 NOTE — Therapy (Signed)
[facility] [address], [address] [place], [place], [address] Phone: [phone]   Fax:  [phone]  Physical Therapy Treatment  Patient Details  Name: [name] MRN: [id number] Date of Birth: 1940/10/22 Referring Provider: Dorothyann Peng   Encounter Date: 02/27/2017      PT End of Session - 02/27/17 1104    Visit Number 4   Number of Visits 10   Date for PT Re-Evaluation 04/01/17   Authorization Type medicare gcodes; KX 15th visit   PT Start Time 1101   PT Stop Time 1145   PT Time Calculation (min) 44 min   Activity Tolerance Patient tolerated treatment well   Behavior During Therapy [facility] for tasks assessed/performed      Past Medical History:  Diagnosis Date  . Acute sinus infection 01/15/2011  . ANXIETY 10/03/2009  . BARRETTS ESOPHAGUS 10/03/2009  . CAROTID ARTERY DISEASE 08/31/2009  . COLONIC POLYPS, HX OF 08/29/2009  . CONSTIPATION, CHRONIC 10/03/2009  . DEPRESSION 10/03/2009  . DISC DISEASE, CERVICAL 08/29/2009  . DISC DISEASE, LUMBAR 08/29/2009  . Eustachian tube dysfunction 01/15/2011  . GERD 08/29/2009  . HYPERLIPIDEMIA 08/29/2009  . Impaired glucose tolerance 01/14/2011  . LEG PAIN, BILATERAL 08/29/2009  . LOW BACK PAIN, CHRONIC 11/22/2009  . PERIPHERAL NEUROPATHY 10/03/2009  . PERIPHERAL VASCULAR DISEASE 08/29/2009  . POSITIVE PPD 10/03/2009  . Preventative health care 01/14/2011  . Stroke (HCC)   . Thyroid nodule 08/09/2011  . TIA 11/22/2009  . TRANSIENT ISCHEMIC ATTACK, HX OF 08/29/2009  . URETHRAL STRICTURE 10/03/2009  . VOCAL CORD POLYP, HX OF 10/03/2009  . WRIST PAIN, BILATERAL 05/25/2010    Past Surgical History:  Procedure Laterality Date  . hx of right ankle fracture    . s/p vocal cord polyps  1984   chronic hoarseness  . stress test negative  2003,2004,2006  . TUBAL LIGATION    . uterine prolapse      There were no vitals filed for this visit.      Subjective Assessment - 02/27/17 1106    Subjective My husbands health is declining rapidly and this has been a strain the past week.    Patient Stated Goals strengthen myself so I can walk   Currently in Pain? No/denies   Multiple Pain Sites No                         [facility] Adult PT Treatment/Exercise - 02/27/17 0001      High Level Balance   High Level Balance Activities Side stepping;Tandem walking     Knee/Hip Exercises: Aerobic   Recumbent Bike L1 x 7 min  PTA discussed status during     Knee/Hip Exercises: Machines for Strengthening   Cybex Knee Flexion Seat 6 BilLE 35# 2x 10, RTLE 25# 2x10    VC for control     Knee/Hip Exercises: Standing   Hip Abduction AROM;Stengthening;Both;2 sets;10 reps;Knee straight   Hip Extension AROM;Stengthening;Both;2 sets;10 reps;Knee straight   Lateral Step Up Right;2 sets;10 reps;Hand Hold: 1;Step Height: 6"   Forward Step Up Right;2 sets;10 reps;Hand Hold: 1;Step Height: 6"     Knee/Hip Exercises: Supine   Bridges Strengthening;Both;20 reps  3" hold    Bridges with Clamshell 20 reps  with green TB at knees and sustained hip abd/ER    Other Supine Knee/Hip Exercises Diaphragmatic breathing                 PT Education -  02/27/17 1126    Education provided Yes   Education Details Standing hip abd, extension for HEP   Person(s) Educated Patient   Methods Explanation;Tactile cues;Demonstration;Verbal cues;Handout   Comprehension Verbalized understanding;Returned demonstration          PT Short Term Goals - 02/27/17 1107      PT SHORT TERM GOAL #1   Title pt independent with initial HEP working on hip strength and balance   Time 4   Period Weeks   Status Achieved     PT SHORT TERM GOAL #2   Title pt able to walk the equivilant of 4 blocks without being limited by decreased balance or weakness   Time 4   Period Weeks   Status On-going  Walking 1/2 block     PT SHORT TERM GOAL #3   Title able to perform diaphragmatic breathing correctly  for stress reduction and to reduce episodes of getting up at night.   Time 4   Period Weeks   Status Achieved           PT Long Term Goals - 02/20/17 1113      PT LONG TERM GOAL #1   Title Berg balance 53/56 for reduced risk of falls   Baseline 51/56   Time 8   Period Weeks   Status On-going     PT LONG TERM GOAL #2   Title Pt reports she is able to walk up to 1/2 mile without stopping due to fatigue or balance issues for return to previous walking activities   Time 8   Period Weeks   Status On-going     PT LONG TERM GOAL #3   Title LE strength improved to 5/5 MMT bilateral for improved gait without Trendelenburg on the right   Time 8   Period Weeks   Status On-going     PT LONG TERM GOAL #4   Title Pt reports 50% less incidents of vearing to the right when walking due to improved proprioception and LE strength   Time 8   Period Weeks   Status On-going               Plan - 02/27/17 1105    Clinical Impression Statement No falls to report. Pt reports taking care of her husband makes doingher HEP difficult. She is getting them in at least 1 xday. We discussed switching sides of her bed to see if this helps decrease her falls off the bed. Pt tolerated all her exercises without LOB , adding standing exs to her HEP. Currently she walks 1/2 block with her husband.    Rehab Potential Good   PT Frequency 1x / week   PT Duration 8 weeks   PT Treatment/Interventions ADLs/Self Care Home Management;Biofeedback;Cryotherapy;Radiation protection practitioner;Therapeutic activities;Therapeutic exercise;Balance training;Neuromuscular re-education;Patient/family education;Manual techniques;Taping   PT Next Visit Plan She if pt tried switching sides of the bed. Continue increasing Rt sided awareness and strength   Consulted and Agree with Plan of Care --      Patient will benefit from skilled therapeutic intervention in order to improve  the following deficits and impairments:  Abnormal gait, Decreased balance, Decreased strength, Increased edema, Postural dysfunction  Visit Diagnosis: Difficulty in walking, not elsewhere classified  Muscle weakness (generalized)  Repeated falls     Problem List Patient Active Problem List   Diagnosis Date Noted  . Acute left hemiparesis (HCC)   . TIA (transient ischemic attack) 06/01/2016  . CVA (cerebral vascular  accident) (HCC) 06/01/2016  . Transient cerebral ischemia   . Complicated migraine 08/22/2014  . Multinodular goiter 08/09/2011  . Lumbar pain with radiation down right leg 04/11/2011  . Rash 04/11/2011  . Impaired glucose tolerance 01/14/2011  . Preventative health care 01/14/2011  . WRIST PAIN, BILATERAL 05/25/2010  . TIA 11/22/2009  . LOW BACK PAIN, CHRONIC 11/22/2009  . ANXIETY 10/03/2009  . DEPRESSION 10/03/2009  . PERIPHERAL NEUROPATHY 10/03/2009  . BARRETTS ESOPHAGUS 10/03/2009  . CONSTIPATION, CHRONIC 10/03/2009  . URETHRAL STRICTURE 10/03/2009  . POSITIVE PPD 10/03/2009  . VOCAL CORD POLYP, HX OF 10/03/2009  . Carotid artery disease (HCC) 08/31/2009  . Hyperlipemia 08/29/2009  . PERIPHERAL VASCULAR DISEASE 08/29/2009  . GERD 08/29/2009  . DISC DISEASE, CERVICAL 08/29/2009  . DISC DISEASE, LUMBAR 08/29/2009  . LEG PAIN, BILATERAL 08/29/2009  . TRANSIENT ISCHEMIC ATTACK, HX OF 08/29/2009  . COLONIC POLYPS, HX OF 08/29/2009    COCHRAN,JENNIFER, PTA 02/27/2017, 11:48 AM  [facility] [address] [place], [place], [address] Phone: [phone]   Fax:  [phone]  Name: [name] MRN: [id number] Date of Birth: 08-Nov-1940

## 2017-02-27 NOTE — Patient Instructions (Signed)
Hip Abduction (Standing)    Stand with support.  Lift right leg out to side, keeping toe forward.Not too high!. Go slowly.  Repeat __10_ times. Do _2__ times a day. Repeat with other leg.   EXTENSION: Standing (Active)    Stand, both feet flat. Draw right leg behind body gently. No weights. Complete _1__ sets of __10_ repetitions. Perform __2_ sessions per day.  http://gtsc.exer.us/77   Copyright  VHI. All rights reserved.     Copyright  VHI. All rights reserved.

## 2017-03-06 ENCOUNTER — Encounter: Payer: Self-pay | Admitting: Physical Therapy

## 2017-03-06 ENCOUNTER — Ambulatory Visit: Payer: PPO | Admitting: Physical Therapy

## 2017-03-06 DIAGNOSIS — R262 Difficulty in walking, not elsewhere classified: Secondary | ICD-10-CM

## 2017-03-06 DIAGNOSIS — R296 Repeated falls: Secondary | ICD-10-CM

## 2017-03-06 DIAGNOSIS — M6281 Muscle weakness (generalized): Secondary | ICD-10-CM

## 2017-03-06 NOTE — Patient Instructions (Signed)
New HEP:  1. Sit to stand: No arms. Sit towards the edge of the chair. Either cross your arms on your chest or put out to the side, nose over toes, then press your feet into the floor to stand up. Then sit down. Do 2 x10 . Always in control.

## 2017-03-06 NOTE — Therapy (Signed)
[facility] [address], [address] [place], [place], [address] Phone: [phone]   Fax:  [phone]  Physical Therapy Treatment  Patient Details  Name: [name] MRN: [id number] Date of Birth: 02-08-1941 Referring Provider: Dorothyann Peng   Encounter Date: 03/06/2017      PT End of Session - 03/06/17 1102    Visit Number 5   Number of Visits 10   Date for PT Re-Evaluation 04/01/17   Authorization Type medicare gcodes; KX 15th visit   PT Start Time 1102   PT Stop Time 1140   PT Time Calculation (min) 38 min      Past Medical History:  Diagnosis Date  . Acute sinus infection 01/15/2011  . ANXIETY 10/03/2009  . BARRETTS ESOPHAGUS 10/03/2009  . CAROTID ARTERY DISEASE 08/31/2009  . COLONIC POLYPS, HX OF 08/29/2009  . CONSTIPATION, CHRONIC 10/03/2009  . DEPRESSION 10/03/2009  . DISC DISEASE, CERVICAL 08/29/2009  . DISC DISEASE, LUMBAR 08/29/2009  . Eustachian tube dysfunction 01/15/2011  . GERD 08/29/2009  . HYPERLIPIDEMIA 08/29/2009  . Impaired glucose tolerance 01/14/2011  . LEG PAIN, BILATERAL 08/29/2009  . LOW BACK PAIN, CHRONIC 11/22/2009  . PERIPHERAL NEUROPATHY 10/03/2009  . PERIPHERAL VASCULAR DISEASE 08/29/2009  . POSITIVE PPD 10/03/2009  . Preventative health care 01/14/2011  . Stroke (HCC)   . Thyroid nodule 08/09/2011  . TIA 11/22/2009  . TRANSIENT ISCHEMIC ATTACK, HX OF 08/29/2009  . URETHRAL STRICTURE 10/03/2009  . VOCAL CORD POLYP, HX OF 10/03/2009  . WRIST PAIN, BILATERAL 05/25/2010    Past Surgical History:  Procedure Laterality Date  . hx of right ankle fracture    . s/p vocal cord polyps  1984   chronic hoarseness  . stress test negative  2003,2004,2006  . TUBAL LIGATION    . uterine prolapse      There were no vitals filed for this visit.      Subjective Assessment - 03/06/17 1104    Subjective I am very tired today and my blood pressure is low today. I measured 114/95 mmhg this AM. Not sure how much  therapy I want to do today.    Currently in Pain? No/denies   Multiple Pain Sites No            [facility] PT Assessment - 03/06/17 0001      High Level Balance   High Level Balance Activites --  Sit to stand 10 sec no UE                     [facility] Adult PT Treatment/Exercise - 03/06/17 0001      High Level Balance   High Level Balance Activities Side stepping;Direction changes;Tandem walking   High Level Balance Comments Single leg stance with toe taps forward/side/back 10x     Knee/Hip Exercises: Aerobic   Recumbent Bike L1 x 5 min  PTA present for status     Knee/Hip Exercises: Machines for Strengthening   Cybex Knee Flexion Seat 6 35# bil 10x      Knee/Hip Exercises: Seated   Sit to Sand 2 sets;10 reps;without UE support                PT Education - 03/06/17 1125    Education provided Yes   Education Details HEP: sit to stand no UE   Person(s) Educated Patient   Methods Explanation;Demonstration;Verbal cues;Handout   Comprehension Verbalized understanding;Returned demonstration          PT  Short Term Goals - 03/06/17 1134      PT SHORT TERM GOAL #4   Title 5 times sit to stand decreased to < or = to 10 sec due to improved LE strength and balance   Time 4   Period Weeks   Status Achieved  10 sec           PT Long Term Goals - 02/20/17 1113      PT LONG TERM GOAL #1   Title Berg balance 53/56 for reduced risk of falls   Baseline 51/56   Time 8   Period Weeks   Status On-going     PT LONG TERM GOAL #2   Title Pt reports she is able to walk up to 1/2 mile without stopping due to fatigue or balance issues for return to previous walking activities   Time 8   Period Weeks   Status On-going     PT LONG TERM GOAL #3   Title LE strength improved to 5/5 MMT bilateral for improved gait without Trendelenburg on the right   Time 8   Period Weeks   Status On-going     PT LONG TERM GOAL #4   Title Pt reports 50% less incidents of  vearing to the right when walking due to improved proprioception and LE strength   Time 8   Period Weeks   Status On-going               Plan - 03/06/17 1102    Clinical Impression Statement Pt presents today fatigued, questioning how much she can/wants to do today but willing to try. Total volume of session was reduced slightly to accomodate her fatigue. She has switched sides of her bed and has not fallen out since and she also reports an overall feeling of improvement and steadiness.  She met her short term goal of sit to stand in 10 sec . Pt felt better after exercising, reports having more energy.    Rehab Potential Good   PT Frequency 1x / week   PT Duration 8 weeks   PT Treatment/Interventions ADLs/Self Care Home Management;Biofeedback;Cryotherapy;Radiation protection practitioner;Therapeutic activities;Therapeutic exercise;Balance training;Neuromuscular re-education;Patient/family education;Manual techniques;Taping   PT Next Visit Plan Continue with RT sided strength and overall balance and steadiness.    Consulted and Agree with Plan of Care Patient      Patient will benefit from skilled therapeutic intervention in order to improve the following deficits and impairments:  Abnormal gait, Decreased balance, Decreased strength, Increased edema, Postural dysfunction  Visit Diagnosis: Difficulty in walking, not elsewhere classified  Muscle weakness (generalized)  Repeated falls     Problem List Patient Active Problem List   Diagnosis Date Noted  . Acute left hemiparesis (HCC)   . TIA (transient ischemic attack) 06/01/2016  . CVA (cerebral vascular accident) (HCC) 06/01/2016  . Transient cerebral ischemia   . Complicated migraine 08/22/2014  . Multinodular goiter 08/09/2011  . Lumbar pain with radiation down right leg 04/11/2011  . Rash 04/11/2011  . Impaired glucose tolerance 01/14/2011  . Preventative health care 01/14/2011   . WRIST PAIN, BILATERAL 05/25/2010  . TIA 11/22/2009  . LOW BACK PAIN, CHRONIC 11/22/2009  . ANXIETY 10/03/2009  . DEPRESSION 10/03/2009  . PERIPHERAL NEUROPATHY 10/03/2009  . BARRETTS ESOPHAGUS 10/03/2009  . CONSTIPATION, CHRONIC 10/03/2009  . URETHRAL STRICTURE 10/03/2009  . POSITIVE PPD 10/03/2009  . VOCAL CORD POLYP, HX OF 10/03/2009  . Carotid artery disease (HCC) 08/31/2009  . Hyperlipemia 08/29/2009  .  PERIPHERAL VASCULAR DISEASE 08/29/2009  . GERD 08/29/2009  . DISC DISEASE, CERVICAL 08/29/2009  . DISC DISEASE, LUMBAR 08/29/2009  . LEG PAIN, BILATERAL 08/29/2009  . TRANSIENT ISCHEMIC ATTACK, HX OF 08/29/2009  . COLONIC POLYPS, HX OF 08/29/2009    Talicia Sui, PTA 03/06/2017, 11:46 AM  [facility] [address] [place], [place], [address] Phone: [phone]   Fax:  [phone]  Name: BOZENA SILVERIA MRN: [id number] Date of Birth: [birth year]

## 2017-03-13 ENCOUNTER — Encounter: Payer: PPO | Admitting: Physical Therapy

## 2017-03-20 ENCOUNTER — Encounter: Payer: PPO | Admitting: Physical Therapy

## 2017-03-21 DIAGNOSIS — I6522 Occlusion and stenosis of left carotid artery: Secondary | ICD-10-CM | POA: Diagnosis not present

## 2017-03-27 ENCOUNTER — Ambulatory Visit: Payer: PPO

## 2017-03-27 DIAGNOSIS — R262 Difficulty in walking, not elsewhere classified: Secondary | ICD-10-CM

## 2017-03-27 DIAGNOSIS — M6281 Muscle weakness (generalized): Secondary | ICD-10-CM

## 2017-03-27 DIAGNOSIS — R296 Repeated falls: Secondary | ICD-10-CM

## 2017-03-27 NOTE — Therapy (Signed)
[facility] [address], [address] [place], [place], [address] Phone: [phone]   Fax:  [phone]  Physical Therapy Treatment  Patient Details  Name: [name] MRN: [id number] Date of Birth: 1941-06-28 Referring Provider: Dorothyann Peng   Encounter Date: 03/27/2017      PT End of Session - 03/27/17 1115    Visit Number 6   Number of Visits 10   Date for PT Re-Evaluation 04/01/17   Authorization Type medicare gcodes; KX 15th visit   PT Start Time 1105  pt. arrived late    PT Stop Time 1146   PT Time Calculation (min) 41 min      Past Medical History:  Diagnosis Date  . Acute sinus infection 01/15/2011  . ANXIETY 10/03/2009  . BARRETTS ESOPHAGUS 10/03/2009  . CAROTID ARTERY DISEASE 08/31/2009  . COLONIC POLYPS, HX OF 08/29/2009  . CONSTIPATION, CHRONIC 10/03/2009  . DEPRESSION 10/03/2009  . DISC DISEASE, CERVICAL 08/29/2009  . DISC DISEASE, LUMBAR 08/29/2009  . Eustachian tube dysfunction 01/15/2011  . GERD 08/29/2009  . HYPERLIPIDEMIA 08/29/2009  . Impaired glucose tolerance 01/14/2011  . LEG PAIN, BILATERAL 08/29/2009  . LOW BACK PAIN, CHRONIC 11/22/2009  . PERIPHERAL NEUROPATHY 10/03/2009  . PERIPHERAL VASCULAR DISEASE 08/29/2009  . POSITIVE PPD 10/03/2009  . Preventative health care 01/14/2011  . Stroke (HCC)   . Thyroid nodule 08/09/2011  . TIA 11/22/2009  . TRANSIENT ISCHEMIC ATTACK, HX OF 08/29/2009  . URETHRAL STRICTURE 10/03/2009  . VOCAL CORD POLYP, HX OF 10/03/2009  . WRIST PAIN, BILATERAL 05/25/2010    Past Surgical History:  Procedure Laterality Date  . hx of right ankle fracture    . s/p vocal cord polyps  1984   chronic hoarseness  . stress test negative  2003,2004,2006  . TUBAL LIGATION    . uterine prolapse      There were no vitals filed for this visit.      Subjective Assessment - 03/27/17 1110    Subjective Pt. noting i'm tired following my husbands surgery yesterday.  Daughter staying with pt. to  help out.  Pt. noting R leg "feels better with less stumbling".     Currently in Pain? No/denies   Multiple Pain Sites No                         [facility] Adult PT Treatment/Exercise - 03/27/17 1108      Neuro Re-ed    Neuro Re-ed Details  B tandem stance, B SLS 2 x 20 sec each way; touch support on step rail     Knee/Hip Exercises: Aerobic   Recumbent Bike Lvl 2, 8 min      Knee/Hip Exercises: Standing   Heel Raises Both;Right;Left;15 reps;3 seconds  2 sets; 1st with R ecc, 2nd set with L ecc    Heel Raises Limitations B con/single leg ecc    Hip Flexion Right;Left;10 reps;Knee straight   Hip Flexion Limitations red TB around ankles; 2 ski poles   Hip Abduction AROM;Stengthening;Both;2 sets;10 reps;Knee straight   Abduction Limitations red TB    Hip Extension AROM;Stengthening;Both;2 sets;10 reps;Knee straight   Extension Limitations red TB    Other Standing Knee Exercises Standing side-step and monster walk with red TB 2 x 40 ft each way.       Knee/Hip Exercises: Seated   Sit to Sand 3 sets;5 reps  LE on airex pads  PT Education - 03/27/17 1151    Education provided Yes   Education Details single leg stance    Person(s) Educated Patient   Methods Explanation;Demonstration;Verbal cues;Handout   Comprehension Verbalized understanding;Returned demonstration;Verbal cues required;Need further instruction          PT Short Term Goals - 03/06/17 1134      PT SHORT TERM GOAL #4   Title 5 times sit to stand decreased to < or = to 10 sec due to improved LE strength and balance   Time 4   Period Weeks   Status Achieved  10 sec           PT Long Term Goals - 02/20/17 1113      PT LONG TERM GOAL #1   Title Berg balance 53/56 for reduced risk of falls   Baseline 51/56   Time 8   Period Weeks   Status On-going     PT LONG TERM GOAL #2   Title Pt reports she is able to walk up to 1/2 mile without stopping due to fatigue or  balance issues for return to previous walking activities   Time 8   Period Weeks   Status On-going     PT LONG TERM GOAL #3   Title LE strength improved to 5/5 MMT bilateral for improved gait without Trendelenburg on the right   Time 8   Period Weeks   Status On-going     PT LONG TERM GOAL #4   Title Pt reports 50% less incidents of vearing to the right when walking due to improved proprioception and LE strength   Time 8   Period Weeks   Status On-going               Plan - 03/27/17 1153    Clinical Impression Statement [name] doing well today.  Noting tired due to caring for husband who just had surgery yesterday.  Tolerated all standing balance and strengthening activities well today without issue.  HEP updated today to include SLS balance activities as pt. had difficulty with this.     PT Treatment/Interventions ADLs/Self Care Home Management;Biofeedback;Cryotherapy;Radiation protection practitioner;Therapeutic activities;Therapeutic exercise;Balance training;Neuromuscular re-education;Patient/family education;Manual techniques;Taping   PT Next Visit Plan Continue with RT sided strength and overall balance and steadiness      Patient will benefit from skilled therapeutic intervention in order to improve the following deficits and impairments:  Abnormal gait, Decreased balance, Decreased strength, Increased edema, Postural dysfunction  Visit Diagnosis: Difficulty in walking, not elsewhere classified  Muscle weakness (generalized)  Repeated falls     Problem List Patient Active Problem List   Diagnosis Date Noted  . Acute left hemiparesis (HCC)   . TIA (transient ischemic attack) 06/01/2016  . CVA (cerebral vascular accident) (HCC) 06/01/2016  . Transient cerebral ischemia   . Complicated migraine 08/22/2014  . Multinodular goiter 08/09/2011  . Lumbar pain with radiation down right leg 04/11/2011  . Rash 04/11/2011  .  Impaired glucose tolerance 01/14/2011  . Preventative health care 01/14/2011  . WRIST PAIN, BILATERAL 05/25/2010  . TIA 11/22/2009  . LOW BACK PAIN, CHRONIC 11/22/2009  . ANXIETY 10/03/2009  . DEPRESSION 10/03/2009  . PERIPHERAL NEUROPATHY 10/03/2009  . BARRETTS ESOPHAGUS 10/03/2009  . CONSTIPATION, CHRONIC 10/03/2009  . URETHRAL STRICTURE 10/03/2009  . POSITIVE PPD 10/03/2009  . VOCAL CORD POLYP, HX OF 10/03/2009  . Carotid artery disease (HCC) 08/31/2009  . Hyperlipemia 08/29/2009  . PERIPHERAL VASCULAR DISEASE 08/29/2009  . GERD 08/29/2009  .  DISC DISEASE, CERVICAL 08/29/2009  . DISC DISEASE, LUMBAR 08/29/2009  . LEG PAIN, BILATERAL 08/29/2009  . TRANSIENT ISCHEMIC ATTACK, HX OF 08/29/2009  . COLONIC POLYPS, HX OF 08/29/2009    Kermit Balo, PTA 03/27/17 3:17 PM   [facility] [address] [place], [place], [address] Phone: [phone]   Fax:  [phone]  Name: [name] MRN: [id number] Date of Birth: Aug 31, 1940

## 2017-04-03 ENCOUNTER — Encounter: Payer: Self-pay | Admitting: Physical Therapy

## 2017-04-03 ENCOUNTER — Ambulatory Visit: Payer: PPO | Attending: Internal Medicine | Admitting: Physical Therapy

## 2017-04-03 DIAGNOSIS — R262 Difficulty in walking, not elsewhere classified: Secondary | ICD-10-CM | POA: Insufficient documentation

## 2017-04-03 DIAGNOSIS — M6281 Muscle weakness (generalized): Secondary | ICD-10-CM

## 2017-04-03 DIAGNOSIS — R296 Repeated falls: Secondary | ICD-10-CM | POA: Diagnosis not present

## 2017-04-03 NOTE — Therapy (Signed)
[facility] [address] [place], [place], [address] Phone: [phone]   Fax:  [phone]  Physical Therapy Treatment  Patient Details  Name: [name] MRN: [id number] Date of Birth: 27-Nov-1940 Referring Provider: Glendale Chard   Encounter Date: 04/03/2017      PT End of Session - 04/03/17 1111    Visit Number 7   Number of Visits 10   Date for PT Re-Evaluation 04/01/17   Authorization Type medicare gcodes; KX 15th visit   PT Start Time 1102   PT Stop Time 1142   PT Time Calculation (min) 40 min   Activity Tolerance Patient tolerated treatment well   Behavior During Therapy [facility] for tasks assessed/performed      Past Medical History:  Diagnosis Date  . Acute sinus infection 01/15/2011  . ANXIETY 10/03/2009  . BARRETTS ESOPHAGUS 10/03/2009  . CAROTID ARTERY DISEASE 08/31/2009  . COLONIC POLYPS, HX OF 08/29/2009  . CONSTIPATION, CHRONIC 10/03/2009  . DEPRESSION 10/03/2009  . [place] DISEASE, CERVICAL 08/29/2009  . [place] DISEASE, LUMBAR 08/29/2009  . Eustachian tube dysfunction 01/15/2011  . GERD 08/29/2009  . HYPERLIPIDEMIA 08/29/2009  . Impaired glucose tolerance 01/14/2011  . LEG PAIN, BILATERAL 08/29/2009  . LOW BACK PAIN, CHRONIC 11/22/2009  . PERIPHERAL NEUROPATHY 10/03/2009  . PERIPHERAL VASCULAR DISEASE 08/29/2009  . POSITIVE PPD 10/03/2009  . Preventative health care 01/14/2011  . Stroke ([place])   . Thyroid nodule 08/09/2011  . TIA 11/22/2009  . TRANSIENT ISCHEMIC ATTACK, HX OF 08/29/2009  . URETHRAL STRICTURE 10/03/2009  . VOCAL CORD POLYP, HX OF 10/03/2009  . WRIST PAIN, BILATERAL 05/25/2010    Past Surgical History:  Procedure Laterality Date  . hx of right ankle fracture    . s/p vocal cord polyps  1984   chronic hoarseness  . stress test negative  2003,2004,2006  . TUBAL LIGATION    . uterine prolapse      There were no vitals filed for this visit.      Subjective Assessment - 04/03/17 1109    Subjective Patient states her husband has been not doing well and has had to be caretaker to her husband.  Feels like her right leg is stronger and she has not stubled since coming to PT.  States she would like to transition to [facility]   Limitations Walking;Standing   How long can you stand comfortably? 2 hours   How long can you walk comfortably? 2 blocks   Patient Stated Goals strengthen myself so I can walk   Currently in Pain? Yes                         [facility] Adult PT Treatment/Exercise - 04/03/17 0001      Standardized Balance Assessment   Standardized Balance Assessment Berg Balance Test     Berg Balance Test   Sit to Stand Able to stand without using hands and stabilize independently   Standing Unsupported Able to stand safely 2 minutes   Sitting with Back Unsupported but Feet Supported on Floor or Stool Able to sit safely and securely 2 minutes   Stand to Sit Sits safely with minimal use of hands   Transfers Able to transfer safely, minor use of hands   Standing Unsupported with Eyes Closed Able to stand 10 seconds safely   Standing Ubsupported with Feet Together Able to place feet together independently and stand 1 minute safely   From Standing, Reach  [facility] [address] [place], [place], [address] Phone: [phone]   Fax:  [phone]  Physical Therapy Treatment  Patient Details  Name: [name] MRN: [id number] Date of Birth: 27-Nov-1940 Referring Provider: Glendale Chard   Encounter Date: 04/03/2017      PT End of Session - 04/03/17 1111    Visit Number 7   Number of Visits 10   Date for PT Re-Evaluation 04/01/17   Authorization Type medicare gcodes; KX 15th visit   PT Start Time 1102   PT Stop Time 1142   PT Time Calculation (min) 40 min   Activity Tolerance Patient tolerated treatment well   Behavior During Therapy [facility] for tasks assessed/performed      Past Medical History:  Diagnosis Date  . Acute sinus infection 01/15/2011  . ANXIETY 10/03/2009  . BARRETTS ESOPHAGUS 10/03/2009  . CAROTID ARTERY DISEASE 08/31/2009  . COLONIC POLYPS, HX OF 08/29/2009  . CONSTIPATION, CHRONIC 10/03/2009  . DEPRESSION 10/03/2009  . [place] DISEASE, CERVICAL 08/29/2009  . [place] DISEASE, LUMBAR 08/29/2009  . Eustachian tube dysfunction 01/15/2011  . GERD 08/29/2009  . HYPERLIPIDEMIA 08/29/2009  . Impaired glucose tolerance 01/14/2011  . LEG PAIN, BILATERAL 08/29/2009  . LOW BACK PAIN, CHRONIC 11/22/2009  . PERIPHERAL NEUROPATHY 10/03/2009  . PERIPHERAL VASCULAR DISEASE 08/29/2009  . POSITIVE PPD 10/03/2009  . Preventative health care 01/14/2011  . Stroke ([place])   . Thyroid nodule 08/09/2011  . TIA 11/22/2009  . TRANSIENT ISCHEMIC ATTACK, HX OF 08/29/2009  . URETHRAL STRICTURE 10/03/2009  . VOCAL CORD POLYP, HX OF 10/03/2009  . WRIST PAIN, BILATERAL 05/25/2010    Past Surgical History:  Procedure Laterality Date  . hx of right ankle fracture    . s/p vocal cord polyps  1984   chronic hoarseness  . stress test negative  2003,2004,2006  . TUBAL LIGATION    . uterine prolapse      There were no vitals filed for this visit.      Subjective Assessment - 04/03/17 1109    Subjective Patient states her husband has been not doing well and has had to be caretaker to her husband.  Feels like her right leg is stronger and she has not stubled since coming to PT.  States she would like to transition to [facility]   Limitations Walking;Standing   How long can you stand comfortably? 2 hours   How long can you walk comfortably? 2 blocks   Patient Stated Goals strengthen myself so I can walk   Currently in Pain? Yes                         [facility] Adult PT Treatment/Exercise - 04/03/17 0001      Standardized Balance Assessment   Standardized Balance Assessment Berg Balance Test     Berg Balance Test   Sit to Stand Able to stand without using hands and stabilize independently   Standing Unsupported Able to stand safely 2 minutes   Sitting with Back Unsupported but Feet Supported on Floor or Stool Able to sit safely and securely 2 minutes   Stand to Sit Sits safely with minimal use of hands   Transfers Able to transfer safely, minor use of hands   Standing Unsupported with Eyes Closed Able to stand 10 seconds safely   Standing Ubsupported with Feet Together Able to place feet together independently and stand 1 minute safely   From Standing, Reach  to improved proprioception and LE strength   Baseline at least 50%   Time 8   Period Weeks   Status Achieved               Plan - 2017-04-20 1111    Clinical Impression Statement Patient is doing well today.  She was able to increase resistance with exercises and demonstrates ability to perform balance exercises with supervision.  Patient benefitted from today's treatment to ensure she is able to transition to HEP successfully    Rehab Potential Good   PT Treatment/Interventions ADLs/Self Care Home Management;Biofeedback;Cryotherapy;English as a second language teacher;Therapeutic activities;Therapeutic exercise;Balance training;Neuromuscular re-education;Patient/family education;Manual techniques;Taping   PT Next Visit Plan discharged today   Consulted and Agree with Plan of Care Patient      Patient will benefit from skilled therapeutic intervention in order to improve the following deficits and impairments:  Abnormal gait, Decreased balance, Decreased strength, Increased edema, Postural dysfunction  Visit Diagnosis: Difficulty in walking, not elsewhere classified - Plan: PT plan of care cert/re-cert  Muscle weakness (generalized) - Plan: PT plan of care cert/re-cert  Repeated falls - Plan: PT plan of care cert/re-cert       G-Codes - 20-Apr-2017 1510    Functional Assessment Tool Used (Outpatient Only) Berg balance assessment and clinical reasoning   Functional Limitation Mobility: Walking and moving around   Mobility: Walking and Moving Around Current Status [phone]) At least 1 percent but less than 20 percent impaired, limited or restricted   Mobility: Walking and Moving Around Goal Status [phone]) At least 1 percent but less than 20 percent impaired, limited or restricted    Mobility: Walking and Moving Around Discharge Status [phone]) At least 1 percent but less than 20 percent impaired, limited or restricted      Problem List Patient Active Problem List   Diagnosis Date Noted  . Acute left hemiparesis ([place])   . TIA (transient ischemic attack) 06/01/2016  . CVA (cerebral vascular accident) ([place]) 06/01/2016  . Transient cerebral ischemia   . Complicated migraine 50/09/7046  . Multinodular goiter 08/09/2011  . Lumbar pain with radiation down right leg 04/11/2011  . Rash 04/11/2011  . Impaired glucose tolerance 01/14/2011  . Preventative health care 01/14/2011  . WRIST PAIN, BILATERAL 05/25/2010  . TIA 11/22/2009  . LOW BACK PAIN, CHRONIC 11/22/2009  . ANXIETY 10/03/2009  . DEPRESSION 10/03/2009  . PERIPHERAL NEUROPATHY 10/03/2009  . BARRETTS ESOPHAGUS 10/03/2009  . CONSTIPATION, CHRONIC 10/03/2009  . URETHRAL STRICTURE 10/03/2009  . POSITIVE PPD 10/03/2009  . VOCAL CORD POLYP, HX OF 10/03/2009  . Carotid artery disease ([place]) 08/31/2009  . Hyperlipemia 08/29/2009  . PERIPHERAL VASCULAR DISEASE 08/29/2009  . GERD 08/29/2009  . [place] DISEASE, CERVICAL 08/29/2009  . [place] DISEASE, LUMBAR 08/29/2009  . LEG PAIN, BILATERAL 08/29/2009  . TRANSIENT ISCHEMIC ATTACK, HX OF 08/29/2009  . COLONIC POLYPS, HX OF 08/29/2009    Zannie Cove, PT 04/20/2017, 3:11 PM  [facility] [address] [place], [place], [address] Phone: [phone]   Fax:  [phone]  Name: NEESHA LANGTON MRN: [id number] Date of Birth: 1940-09-23  PHYSICAL THERAPY DISCHARGE SUMMARY  Visits from Start of Care: 7  Current functional level related to goals / functional outcomes: See above   Remaining deficits: See above   Education / Equipment: HEP  Plan: Patient agrees to discharge.  Patient goals were met. Patient is being discharged due to meeting the stated rehab goals.  ?????  [facility] [address] [place], [place], [address] Phone: [phone]   Fax:  [phone]  Physical Therapy Treatment  Patient Details  Name: [name] MRN: [id number] Date of Birth: 27-Nov-1940 Referring Provider: Glendale Chard   Encounter Date: 04/03/2017      PT End of Session - 04/03/17 1111    Visit Number 7   Number of Visits 10   Date for PT Re-Evaluation 04/01/17   Authorization Type medicare gcodes; KX 15th visit   PT Start Time 1102   PT Stop Time 1142   PT Time Calculation (min) 40 min   Activity Tolerance Patient tolerated treatment well   Behavior During Therapy [facility] for tasks assessed/performed      Past Medical History:  Diagnosis Date  . Acute sinus infection 01/15/2011  . ANXIETY 10/03/2009  . BARRETTS ESOPHAGUS 10/03/2009  . CAROTID ARTERY DISEASE 08/31/2009  . COLONIC POLYPS, HX OF 08/29/2009  . CONSTIPATION, CHRONIC 10/03/2009  . DEPRESSION 10/03/2009  . [place] DISEASE, CERVICAL 08/29/2009  . [place] DISEASE, LUMBAR 08/29/2009  . Eustachian tube dysfunction 01/15/2011  . GERD 08/29/2009  . HYPERLIPIDEMIA 08/29/2009  . Impaired glucose tolerance 01/14/2011  . LEG PAIN, BILATERAL 08/29/2009  . LOW BACK PAIN, CHRONIC 11/22/2009  . PERIPHERAL NEUROPATHY 10/03/2009  . PERIPHERAL VASCULAR DISEASE 08/29/2009  . POSITIVE PPD 10/03/2009  . Preventative health care 01/14/2011  . Stroke ([place])   . Thyroid nodule 08/09/2011  . TIA 11/22/2009  . TRANSIENT ISCHEMIC ATTACK, HX OF 08/29/2009  . URETHRAL STRICTURE 10/03/2009  . VOCAL CORD POLYP, HX OF 10/03/2009  . WRIST PAIN, BILATERAL 05/25/2010    Past Surgical History:  Procedure Laterality Date  . hx of right ankle fracture    . s/p vocal cord polyps  1984   chronic hoarseness  . stress test negative  2003,2004,2006  . TUBAL LIGATION    . uterine prolapse      There were no vitals filed for this visit.      Subjective Assessment - 04/03/17 1109    Subjective Patient states her husband has been not doing well and has had to be caretaker to her husband.  Feels like her right leg is stronger and she has not stubled since coming to PT.  States she would like to transition to [facility]   Limitations Walking;Standing   How long can you stand comfortably? 2 hours   How long can you walk comfortably? 2 blocks   Patient Stated Goals strengthen myself so I can walk   Currently in Pain? Yes                         [facility] Adult PT Treatment/Exercise - 04/03/17 0001      Standardized Balance Assessment   Standardized Balance Assessment Berg Balance Test     Berg Balance Test   Sit to Stand Able to stand without using hands and stabilize independently   Standing Unsupported Able to stand safely 2 minutes   Sitting with Back Unsupported but Feet Supported on Floor or Stool Able to sit safely and securely 2 minutes   Stand to Sit Sits safely with minimal use of hands   Transfers Able to transfer safely, minor use of hands   Standing Unsupported with Eyes Closed Able to stand 10 seconds safely   Standing Ubsupported with Feet Together Able to place feet together independently and stand 1 minute safely   From Standing, Reach

## 2017-04-04 DIAGNOSIS — Z79899 Other long term (current) drug therapy: Secondary | ICD-10-CM | POA: Diagnosis not present

## 2017-04-04 DIAGNOSIS — M79601 Pain in right arm: Secondary | ICD-10-CM | POA: Diagnosis not present

## 2017-04-04 DIAGNOSIS — H9201 Otalgia, right ear: Secondary | ICD-10-CM | POA: Diagnosis not present

## 2017-04-04 DIAGNOSIS — E785 Hyperlipidemia, unspecified: Secondary | ICD-10-CM | POA: Diagnosis not present

## 2017-04-04 DIAGNOSIS — R0982 Postnasal drip: Secondary | ICD-10-CM | POA: Diagnosis not present

## 2017-04-18 DIAGNOSIS — M65311 Trigger thumb, right thumb: Secondary | ICD-10-CM | POA: Diagnosis not present

## 2017-04-18 DIAGNOSIS — E7800 Pure hypercholesterolemia, unspecified: Secondary | ICD-10-CM | POA: Diagnosis not present

## 2017-06-13 DIAGNOSIS — E7800 Pure hypercholesterolemia, unspecified: Secondary | ICD-10-CM | POA: Diagnosis not present

## 2017-06-13 DIAGNOSIS — I6523 Occlusion and stenosis of bilateral carotid arteries: Secondary | ICD-10-CM | POA: Diagnosis not present

## 2017-06-13 DIAGNOSIS — I10 Essential (primary) hypertension: Secondary | ICD-10-CM | POA: Diagnosis not present

## 2017-06-13 DIAGNOSIS — R0789 Other chest pain: Secondary | ICD-10-CM | POA: Diagnosis not present

## 2017-06-17 ENCOUNTER — Encounter: Payer: Self-pay | Admitting: Family Medicine

## 2017-06-17 ENCOUNTER — Other Ambulatory Visit: Payer: Self-pay

## 2017-06-17 ENCOUNTER — Ambulatory Visit: Payer: PPO | Admitting: Family Medicine

## 2017-06-17 VITALS — BP 126/84 | HR 69 | Temp 97.6°F | Resp 17 | Ht 66.0 in | Wt 152.0 lb

## 2017-06-17 DIAGNOSIS — Z636 Dependent relative needing care at home: Secondary | ICD-10-CM | POA: Diagnosis not present

## 2017-06-17 DIAGNOSIS — Z23 Encounter for immunization: Secondary | ICD-10-CM

## 2017-06-17 DIAGNOSIS — I10 Essential (primary) hypertension: Secondary | ICD-10-CM

## 2017-06-17 DIAGNOSIS — R42 Dizziness and giddiness: Secondary | ICD-10-CM

## 2017-06-17 NOTE — Progress Notes (Signed)
Chief Complaint  Patient presents with  . New Patient (Initial Visit)    to establish care with a provider close to home, pt has anxiety about driving and doesn't want to have to travel to far or any busy areas in the city.  Has a husband with full blown demetia she takes care of.  . concentration    pt having trouble concentrating and remember things    HPI   Pt reports that she is the caregiver for her husband who has severe dementia.  She states that she has been evaluated by Dr. Einar Gip for chest pains.  She reports that she is so tired she can hardly focus on the things she needs to do.  She thinks she may have to put him in a home.   Hypertension: Patient here for follow-up of elevated blood pressure. She is not exercising and is adherent to low salt diet.  Blood pressure is well controlled at home. Cardiac symptoms lightheadedness. Patient denies none.  Cardiovascular risk factors: advanced age (older than 89 for men, 36 for women) and hypertension. Use of agents associated with hypertension: none. History of target organ damage: none.  4 review of systems  Past Medical History:  Diagnosis Date  . Acute sinus infection 01/15/2011  . ANXIETY 10/03/2009  . BARRETTS ESOPHAGUS 10/03/2009  . CAROTID ARTERY DISEASE 08/31/2009  . COLONIC POLYPS, HX OF 08/29/2009  . CONSTIPATION, CHRONIC 10/03/2009  . DEPRESSION 10/03/2009  . [place] DISEASE, CERVICAL 08/29/2009  . [place] DISEASE, LUMBAR 08/29/2009  . Eustachian tube dysfunction 01/15/2011  . GERD 08/29/2009  . HYPERLIPIDEMIA 08/29/2009  . Impaired glucose tolerance 01/14/2011  . LEG PAIN, BILATERAL 08/29/2009  . LOW BACK PAIN, CHRONIC 11/22/2009  . PERIPHERAL NEUROPATHY 10/03/2009  . PERIPHERAL VASCULAR DISEASE 08/29/2009  . POSITIVE PPD 10/03/2009  . Preventative health care 01/14/2011  . Stroke ([place])   . Thyroid nodule 08/09/2011  . TIA 11/22/2009  . TRANSIENT ISCHEMIC ATTACK, HX OF 08/29/2009  . URETHRAL STRICTURE 10/03/2009  . VOCAL CORD POLYP, HX OF  10/03/2009  . WRIST PAIN, BILATERAL 05/25/2010    Current Outpatient Medications  Medication Sig Dispense Refill  . Calcium Carbonate-Vitamin D (CALCIUM-VITAMIN D) 500-200 MG-UNIT tablet Take 1 tablet by mouth every evening.     . clopidogrel (PLAVIX) 75 MG tablet Take 1 tablet (75 mg total) by mouth daily. 30 tablet 11  . Multiple Vitamins-Minerals (MULTIVITAMIN WITH MINERALS) tablet Take 1 tablet by mouth daily.    . Omega-3 Fatty Acids (FISH OIL) 1000 MG CAPS Take 1,000 mg by mouth daily.     . pravastatin (PRAVACHOL) 20 MG tablet Take 20 mg by mouth daily.    Marland Kitchen FLUZONE HIGH-DOSE 0.5 ML injection     . losartan-hydrochlorothiazide (HYZAAR) 50-12.5 MG tablet TAKE 1 TABLET BY MOUTH EVERY DAY IN THE MORNING  2   No current facility-administered medications for this visit.     Allergies:  Allergies  Allergen Reactions  . Atorvastatin     REACTION: myalgia  . Penicillins Hives and Itching    Has patient had a PCN reaction causing immediate rash, facial/tongue/throat swelling, SOB or lightheadedness with hypotension: no Has patient had a PCN reaction causing severe rash involving mucus membranes or skin necrosis: no Has patient had a PCN reaction that required hospitalization : no Has patient had a PCN reaction occurring within the last 10 years: no If all of the above answers are "NO", then may proceed with Cephalosporin use.     Past  Surgical History:  Procedure Laterality Date  . hx of right ankle fracture    . s/p vocal cord polyps  1984   chronic hoarseness  . stress test negative  2003,2004,2006  . TUBAL LIGATION    . tubaligation    . uterine prolapse      Social History   Socioeconomic History  . Marital status: Married    Spouse name: None  . Number of children: 3  . Years of education: None  . Highest education level: None  Social Needs  . Financial resource strain: None  . Food insecurity - worry: None  . Food insecurity - inability: None  . Transportation  needs - medical: None  . Transportation needs - non-medical: None  Occupational History  . 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 Smoking status: Never Smoker  . Smokeless tobacco: Never Used  Substance and Sexual Activity  . Alcohol use: No  . Drug use: No  . Sexual activity: None  Other Topics Concern  . None  Social History Narrative   Husband with dementia    Family History  Problem Relation Age of Onset  . Stroke Mother   . Heart disease Mother   . Hypertension Mother   . Diabetes Mother   . Multiple myeloma Sister   . Cancer Other        lung     ROS Review of Systems See HPI Constitution: No fevers or chills No malaise No diaphoresis Skin: No rash or itching Eyes: no blurry vision, no double vision GU: no dysuria or hematuria Neuro: no dizziness or headaches all others reviewed and negative   Objective: Vitals:   06/17/17 1435  BP: 126/84  Pulse: 69  Resp: 17  Temp: 97.6 F (36.4 C)  TempSrc: Oral  SpO2: 99%  Weight: 152 lb (68.9 kg)  Height: _0  (1.676 m)   BP Readings from Last 3 Encounters:  06/17/17 126/84  02/11/17 130/70  01/08/17 138/72    Physical Exam  Constitutional: She is oriented to person, place, and time. She appears well-developed and well-nourished.  HENT:  Head: Normocephalic and atraumatic.  Nose: Nose normal.  Mouth/Throat: Oropharynx is clear and moist.  Eyes: Conjunctivae and EOM are normal.  Cardiovascular: Normal rate, regular rhythm and normal heart sounds.  No murmur heard. Pulmonary/Chest: Effort normal and breath sounds normal. No stridor. No respiratory distress. She has no wheezes.  Musculoskeletal: Normal range of motion. She exhibits no edema.  Neurological: She is alert and oriented to person, place, and time.    Assessment and Plan [name] was seen today for new patient (initial visit) and concentration.  Diagnoses and all orders for this visit:  Caregiver  stress- discussed counseling, support groups and individual treatment She declines meds or referrals at this time  Need for prophylactic vaccination against Streptococcus pneumoniae (pneumococcus)  Essential hypertension- tightly controlled bp Discussed common side effects of bp meds Maintain proper hydration  Lightheadedness- advised to cut bp med in half for a week Recently saw Cardiology  Other orders -     Cancel: Flu Vaccine QUAD 36+ mos IM -     Cancel: Pneumococcal conjugate vaccine 13-valent IM   A total of 30 minutes were spent face-to-face with the patient during this encounter and over half of that time was spent on counseling and coordination of care.   [place]

## 2017-06-17 NOTE — Patient Instructions (Addendum)
Take 1/2 tablet of your blood pressure pill for one week If you still feel tired and lightheaded then return to clinic for medication adjustment   IF you received an x-ray today, you will receive an invoice from [facility]. Please contact [facility] at [phone] with questions or concerns regarding your invoice.   IF you received labwork today, you will receive an invoice from [facility]. Please contact [facility] at [phone] with questions or concerns regarding your invoice.   Our billing staff will not be able to assist you with questions regarding bills from these companies.  You will be contacted with the lab results as soon as they are available. The fastest way to get your results is to activate your My Chart account. Instructions are located on the last page of this paperwork. If you have not heard from us regarding the results in 2 weeks, please contact this office.    Dizziness Dizziness is a common problem. It makes you feel unsteady or light-headed. You may feel like you are about to pass out (faint). Dizziness can lead to getting hurt if you stumble or fall. Dizziness can be caused by many things, including:  Medicines.  Not having enough water in your body (dehydration).  Illness.  Follow these instructions at home: Eating and drinking  Drink enough fluid to keep your pee (urine) clear or pale yellow. This helps to keep you from getting dehydrated. Try to drink more clear fluids, such as water.  Do not drink alcohol.  Limit how much caffeine you drink or eat, if your doctor tells you to do that.  Limit how much salt (sodium) you drink or eat, if your doctor tells you to do that. Activity  Avoid making quick movements. ? When you stand up from sitting in a chair, steady yourself until you feel okay. ? In the morning, first sit up on the side of the bed. When you feel okay, stand slowly while you hold onto something. Do this until you know that  your balance is fine.  If you need to stand in one place for a long time, move your legs often. Tighten and relax the muscles in your legs while you are standing.  Do not drive or use heavy machinery if you feel dizzy.  Avoid bending down if you feel dizzy. Place items in your home so you can reach them easily without leaning over. Lifestyle  Do not use any products that contain nicotine or tobacco, such as cigarettes and e-cigarettes. If you need help quitting, ask your doctor.  Try to lower your stress level. You can do this by using methods such as yoga or meditation. Talk with your doctor if you need help. General instructions  Watch your dizziness for any changes.  Take over-the-counter and prescription medicines only as told by your doctor. Talk with your doctor if you think that you are dizzy because of a medicine that you are taking.  Tell a friend or a family member that you are feeling dizzy. If he or she notices any changes in your behavior, have this person call your doctor.  Keep all follow-up visits as told by your doctor. This is important. Contact a doctor if:  Your dizziness does not go away.  Your dizziness or light-headedness gets worse.  You feel sick to your stomach (nauseous).  You have trouble hearing.  You have new symptoms.  You are unsteady on your feet.  You feel like the room is spinning. Get help  right away if:  You throw up (vomit) or have watery poop (diarrhea), and you cannot eat or drink anything.  You have trouble: ? Talking. ? Walking. ? Swallowing. ? Using your arms, hands, or legs.  You feel generally weak.  You are not thinking clearly, or you have trouble forming sentences. A friend or family member may notice this.  You have: ? Chest pain. ? Pain in your belly (abdomen). ? Shortness of breath. ? Sweating.  Your vision changes.  You are bleeding.  You have a very bad headache.  You have neck pain or a stiff  neck.  You have a fever. These symptoms may be an emergency. Do not wait to see if the symptoms will go away. Get medical help right away. Call your local emergency services (911 in the [place] Do not drive yourself to the hospital. Summary  Dizziness makes you feel unsteady or light-headed. You may feel like you are about to pass out (faint).  Drink enough fluid to keep your pee (urine) clear or pale yellow. Do not drink alcohol.  Avoid making quick movements if you feel dizzy.  Watch your dizziness for any changes. This information is not intended to replace advice given to you by your health care provider. Make sure you discuss any questions you have with your health care provider. Document Released: 07/05/2011 Document Revised: 08/02/2016 Document Reviewed: 08/02/2016 Elsevier Interactive Patient Education  2017 [employer]

## 2017-07-17 ENCOUNTER — Ambulatory Visit: Payer: Self-pay | Admitting: Hematology

## 2017-07-17 NOTE — Telephone Encounter (Signed)
Patient C/O dizziness, itching, and cramping in her hands.  Patient states this started on Monday 07/15/17.  Patient states when she bends over she is very dizzy.  Patient denies fever, chest pain, difficulty breathing, N/VD.  Patient also complains of constipation.  Patient does take losartan-hydrochlorothiazde.  Patient states she has not been eating and drinking like she should be. Appt made for 07/18/17 at 10:30 with Dr. Creta LevinStallings.  Patient refused appointment today Reason for Disposition . [1] MILD dizziness (e.g., walking normally) AND [2] has NOT been evaluated by physician for this  (Exception: dizziness caused by heat exposure, sudden standing, or poor fluid intake)  Answer Assessment - Initial Assessment Questions 1. DESCRIPTION: "Describe your dizziness."    Bending down and then stands up, the room is spinning and is black 2. LIGHTHEADED: "Do you feel lightheaded?" (e.g., somewhat faint, woozy, weak upon standing)    Yes,   4. SEVERITY: "How bad is it?"  "Do you feel like you are going to faint?" "Can you stand and walk?"   - MILD - walking normally   - MODERATE - interferes with normal activities (e.g., work, school)    - SEVERE - unable to stand, requires support to walk, feels like passing out now.      Be still 5. ONSET:  "When did the dizziness begin?"     07/15/17 6. AGGRAVATING FACTORS: "Does anything make it worse?" (e.g., standing, change in head position)    Bending over makes it worse, being still makes it better  9. RECURRENT SYMPTOM: "Have you had dizziness before?" If so, ask: "When was the last time?" "What happened that time?"    Unable to recall 10. OTHER SYMPTOMS: "Do you have any other symptoms?" (e.g., fever, chest pain, vomiting, diarrhea, bleeding)       Itching, and constipation  Protocols used: DIZZINESS [facility]- LIGHTHEADEDNESS-A-AH

## 2017-07-18 ENCOUNTER — Ambulatory Visit: Payer: PPO | Admitting: Family Medicine

## 2017-07-18 ENCOUNTER — Other Ambulatory Visit: Payer: Self-pay

## 2017-07-18 ENCOUNTER — Encounter: Payer: Self-pay | Admitting: Family Medicine

## 2017-07-18 VITALS — BP 130/72 | HR 86 | Temp 99.3°F | Resp 16 | Ht 66.0 in | Wt 150.8 lb

## 2017-07-18 DIAGNOSIS — R42 Dizziness and giddiness: Secondary | ICD-10-CM | POA: Diagnosis not present

## 2017-07-18 DIAGNOSIS — R8271 Bacteriuria: Secondary | ICD-10-CM | POA: Diagnosis not present

## 2017-07-18 LAB — POCT CBC
Granulocyte percent: 57.7 %G (ref 37–80)
HCT, POC: 46 % (ref 37.7–47.9)
HEMOGLOBIN: 15 g/dL (ref 12.2–16.2)
Lymph, poc: 1.6 (ref 0.6–3.4)
MCH, POC: 30.9 pg (ref 27–31.2)
MCHC: 32.6 g/dL (ref 31.8–35.4)
MCV: 94.8 fL (ref 80–97)
MID (cbc): 0.1 (ref 0–0.9)
MPV: 8.9 fL (ref 0–99.8)
PLATELET COUNT, POC: 150 10*3/uL (ref 142–424)
POC Granulocyte: 2.4 (ref 2–6.9)
POC LYMPH PERCENT: 39.8 %L (ref 10–50)
POC MID %: 2.5 % (ref 0–12)
RBC: 4.85 M/uL (ref 4.04–5.48)
RDW, POC: 15.7 %
WBC: 4.1 10*3/uL — AB (ref 4.6–10.2)

## 2017-07-18 LAB — POCT URINALYSIS DIP (MANUAL ENTRY)
Bilirubin, UA: NEGATIVE
GLUCOSE UA: NEGATIVE mg/dL
Ketones, POC UA: NEGATIVE mg/dL
Nitrite, UA: POSITIVE — AB
Protein Ur, POC: NEGATIVE mg/dL
RBC UA: NEGATIVE
SPEC GRAV UA: 1.015 (ref 1.010–1.025)
UROBILINOGEN UA: 0.2 U/dL
pH, UA: 7 (ref 5.0–8.0)

## 2017-07-18 NOTE — Patient Instructions (Addendum)
  Avoid sudden movements Do not drive if you do not have to Drink plenty of water to prevent dehydration Follow up with Neurology 08/14/17 We will let you know your lab results   IF you received an x-ray today, you will receive an invoice from [facility]. Please contact [facility] at [phone] with questions or concerns regarding your invoice.   IF you received labwork today, you will receive an invoice from [facility]. Please contact [facility] at [phone] with questions or concerns regarding your invoice.   Our billing staff will not be able to assist you with questions regarding bills from these companies.  You will be contacted with the lab results as soon as they are available. The fastest way to get your results is to activate your My Chart account. Instructions are located on the last page of this paperwork. If you have not heard from us regarding the results in 2 weeks, please contact this office.     Vertigo Vertigo means that you feel like you are moving when you are not. Vertigo can also make you feel like things around you are moving when they are not. This feeling can come and go at any time. Vertigo often goes away on its own. Follow these instructions at home:  Avoid making fast movements.  Avoid driving.  Avoid using heavy machinery.  Avoid doing any task or activity that might cause danger to you or other people if you would have a vertigo attack while you are doing it.  Sit down right away if you feel dizzy or have trouble with your balance.  Take over-the-counter and prescription medicines only as told by your doctor.  Follow instructions from your doctor about which positions or movements you should avoid.  Drink enough fluid to keep your pee (urine) clear or pale yellow.  Keep all follow-up visits as told by your doctor. This is important. Contact a doctor if:  Medicine does not help your vertigo.  You have a fever.  Your problems  get worse or you have new symptoms.  Your family or friends see changes in your behavior.  You feel sick to your stomach (nauseous) or you throw up (vomit).  You have a "pins and needles" feeling or you are numb in part of your body. Get help right away if:  You have trouble moving or talking.  You are always dizzy.  You pass out (faint).  You get very bad headaches.  You feel weak or have trouble using your hands, arms, or legs.  You have changes in your hearing.  You have changes in your seeing (vision).  You get a stiff neck.  Bright light starts to bother you. This information is not intended to replace advice given to you by your health care provider. Make sure you discuss any questions you have with your health care provider. Document Released: 04/24/2008 Document Revised: 12/22/2015 Document Reviewed: 11/08/2014 Elsevier Interactive Patient Education  [employer]2018 Elsevier Inc.

## 2017-07-18 NOTE — Progress Notes (Signed)
Chief Complaint  Patient presents with  . Dizziness    onset: tuesday night, per pt when she bends over and comes back up everything goes black, ha and mucus drainage thick and clear, both hands are cramping.    HPI   Pt reports that she reached down to pick up an item and when she stood up the room went black and the room was spinning and she had to hold on to furniture so that she does not fall.  It takes about 1 minute until the spinning stops.  She states that she has never had anything like this happen before. She recently developed cold like symptoms that include congestion, coughing up mucus and have some tinnitus. She denies fevers or chills She denies ear pain She states that when she is in her bed she does not get these symptoms, sudden head movements like head-checking while driving does not cause this either. She does not get this if she is in her bed and suddenly sits up. She had 4-5 episodes on Tuesday Now she has altered her movements and has not had any more because she avoids reaching down or bending She was previously on Mucinex DM but that was months ago and has not taken any prior or related to her onset of symptoms. She has not been drinking much water  She reports that in the past she has had multiple falls from a shift to the right She sees Dr. Leonie Peck for Neurology at [facility] and has an appt 08/14/2017    Past Medical History:  Diagnosis Date  . Acute sinus infection 01/15/2011  . ANXIETY 10/03/2009  . BARRETTS ESOPHAGUS 10/03/2009  . CAROTID ARTERY DISEASE 08/31/2009  . COLONIC POLYPS, HX OF 08/29/2009  . CONSTIPATION, CHRONIC 10/03/2009  . DEPRESSION 10/03/2009  . [place] DISEASE, CERVICAL 08/29/2009  . [place] DISEASE, LUMBAR 08/29/2009  . Eustachian tube dysfunction 01/15/2011  . GERD 08/29/2009  . HYPERLIPIDEMIA 08/29/2009  . Impaired glucose tolerance 01/14/2011  . LEG PAIN, BILATERAL 08/29/2009  . LOW BACK PAIN, CHRONIC 11/22/2009  . PERIPHERAL NEUROPATHY 10/03/2009  .  PERIPHERAL VASCULAR DISEASE 08/29/2009  . POSITIVE PPD 10/03/2009  . Preventative health care 01/14/2011  . Stroke ([place])   . Thyroid nodule 08/09/2011  . TIA 11/22/2009  . TRANSIENT ISCHEMIC ATTACK, HX OF 08/29/2009  . URETHRAL STRICTURE 10/03/2009  . VOCAL CORD POLYP, HX OF 10/03/2009  . WRIST PAIN, BILATERAL 05/25/2010    Current Outpatient Medications  Medication Sig Dispense Refill  . Calcium Carbonate-Vitamin D (CALCIUM-VITAMIN D) 500-200 MG-UNIT tablet Take 1 tablet by mouth every evening.     . clopidogrel (PLAVIX) 75 MG tablet Take 1 tablet (75 mg total) by mouth daily. 30 tablet 11  . FLUZONE HIGH-DOSE 0.5 ML injection     . losartan-hydrochlorothiazide (HYZAAR) 50-12.5 MG tablet TAKE 1 TABLET BY MOUTH EVERY DAY IN THE MORNING  2  . Multiple Vitamins-Minerals (MULTIVITAMIN WITH MINERALS) tablet Take 1 tablet by mouth daily.    . pravastatin (PRAVACHOL) 20 MG tablet Take 20 mg by mouth daily.    . Omega-3 Fatty Acids (FISH OIL) 1000 MG CAPS Take 1,000 mg by mouth daily.      No current facility-administered medications for this visit.     Allergies:  Allergies  Allergen Reactions  . Atorvastatin     REACTION: myalgia  . Penicillins Hives and Itching    Has patient had a PCN reaction causing immediate rash, facial/tongue/throat swelling, SOB or lightheadedness with hypotension: no Has  patient had a PCN reaction causing severe rash involving mucus membranes or skin necrosis: no Has patient had a PCN reaction that required hospitalization : no Has patient had a PCN reaction occurring within the last 10 years: no If all of the above answers are "NO", then may proceed with Cephalosporin use.     Past Surgical History:  Procedure Laterality Date  . hx of right ankle fracture    . s/p vocal cord polyps  1984   chronic hoarseness  . stress test negative  2003,2004,2006  . TUBAL LIGATION    . tubaligation    . uterine prolapse      Social History   Socioeconomic History  .  Marital status: Married    Spouse name: None  . Number of children: 3  . Years of education: None  . Highest education level: None  Social Needs  . Financial resource strain: None  . Food insecurity - worry: None  . Food insecurity - inability: None  . Transportation needs - medical: None  . Transportation needs - non-medical: None  Occupational History  . 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 Smoking status: Never Smoker  . Smokeless tobacco: Never Used  Substance and Sexual Activity  . Alcohol use: No  . Drug use: No  . Sexual activity: None  Other Topics Concern  . None  Social History Narrative   Husband with dementia    Family History  Problem Relation Age of Onset  . Stroke Mother   . Heart disease Mother   . Hypertension Mother   . Diabetes Mother   . Multiple myeloma Sister   . Cancer Other        lung     ROS Review of Systems See HPI Constitution: No fevers or chills No malaise No diaphoresis Skin: No rash or itching Eyes: no blurry vision, no double vision GU: no dysuria or hematuria Neuro: see hpi all others reviewed and negative   Objective: Vitals:   07/18/17 1043  BP: 130/72  Pulse: 86  Resp: 16  Temp: 99.3 F (37.4 C)  TempSrc: Oral  SpO2: 99%  Weight: 150 lb 12.8 oz (68.4 kg)  Height: '5\' 6"'  (1.676 m)    Physical Exam  Constitutional: She is oriented to person, place, and time. She appears well-developed and well-nourished.  HENT:  Head: Normocephalic and atraumatic.  Right Ear: External ear normal.  Left Ear: External ear normal.  Nose: Nose normal.  Mouth/Throat: Oropharynx is clear and moist. No oropharyngeal exudate.  Eyes: Conjunctivae and EOM are normal.  Pin point pupils  Cardiovascular: Normal rate, regular rhythm and normal heart sounds.  No murmur heard. Pulmonary/Chest: Effort normal and breath sounds normal. No stridor. No respiratory distress. She has no wheezes. She  has no rales.  Neurological: She is alert and oriented to person, place, and time. She displays normal reflexes. No cranial nerve deficit. Coordination normal.  No nystagmus  Skin: Skin is warm. Capillary refill takes less than 2 seconds.  Psychiatric: She has a normal mood and affect. Her behavior is normal. Judgment and thought content normal.    Assessment and Plan [name] was seen today for dizziness.  Diagnoses and all orders for this visit:  Dizziness- likely related to uti No orthostasis -     Orthostatic vital signs -     Basic metabolic panel -     POCT urinalysis dipstick -     POCT  CBC  Asymptomatic bacteriuria -     ciprofloxacin (CIPRO) 250 MG tablet; Take 1 tablet (250 mg total) by mouth 2 (two) times daily for 3 days.  Other orders -     Cancel: Flu Vaccine QUAD 36+ mos IM     Naarah Borgerding A Alizae Bechtel

## 2017-07-19 LAB — BASIC METABOLIC PANEL
BUN / CREAT RATIO: 14 (ref 12–28)
BUN: 13 mg/dL (ref 8–27)
CHLORIDE: 101 mmol/L (ref 96–106)
CO2: 25 mmol/L (ref 20–29)
CREATININE: 0.92 mg/dL (ref 0.57–1.00)
Calcium: 10.3 mg/dL (ref 8.7–10.3)
GFR, EST AFRICAN AMERICAN: 69 mL/min/{1.73_m2} (ref >59)
GFR, EST NON AFRICAN AMERICAN: 60 mL/min/{1.73_m2} (ref >59)
Glucose: 86 mg/dL (ref 65–99)
Potassium: 4.5 mmol/L (ref 3.5–5.2)
Sodium: 142 mmol/L (ref 134–144)

## 2017-07-20 MED ORDER — CIPROFLOXACIN HCL 250 MG PO TABS: 250.0000 mg | ORAL_TABLET | Freq: Two times a day (BID) | ORAL | 0 refills | Status: AC

## 2017-07-22 ENCOUNTER — Ambulatory Visit: Payer: Self-pay | Admitting: *Deleted

## 2017-07-22 NOTE — Telephone Encounter (Signed)
(  Was unable to make this a telephone encounter so doing it as a triage call)  Was given results to urine test. She has a urinary tract infection.   This could make her dizzy as well and just not feel well.  Her kidney function is normal.  Please let her know I sent in Cipro to the pharmacy [facility].   This drug is not going to set off her penicillin allergy but all antibiotics can cause rash, swelling, diarrhea and throat closing so she should watch out for those reactions.   Pt stated she started the Cipro 2 days ago and was doing fine with it.  No further questions.

## 2017-08-13 NOTE — Progress Notes (Signed)
[facility]  PATIENT: MARI MCDOW DOB: 1940/10/28   REASON FOR VISIT: Follow-up for TIA HISTORY FROM: Patient    HISTORY OF PRESENT ILLNESS:UPDATE 1/16/2019CM.  Ms. Clopton, 77 year old female returns for follow-up with history of TIA November 2017.  She is currently on Plavix without further TIA symptoms.  In addition she has no bruising and no bleeding.  She has occasional episodes of her head spinning when she bends over to pick something up.  Lasted approximately 30 seconds.  She reports that she drinks very little water mostly coffee.  Blood pressure in the office today 108/54.  She had another episode like this morning and I suspect that it probably related to mild dehydration.  She has just taken her blood pressure pill and her Plavix when this occurred about 30 minutes later.  It could also be coming from her losartan.  She remains off Pravachol without myalgias she gets little exercise because she also takes care of a husband with dementia.  She reports increased stress with this.  Continues to drive without difficulty.  She returns for reevaluation   UPDATE 07/16/2018CM Ms. Salmonsen, 77 year old female returns for follow-up with a history of TIA and sensory loss on 06/01/2016 She was asked to continue Plavix and stop aspirin at her last visit with Dr. Pearlean Brownie however she has continued both. Minimal bruising is noted but no bleeding. She has not had further stroke or TIA symptoms. She remains on pravastatin without complaints of myalgias. She had an ER visit on 01/08/2017 for dehydration. She is currently been evaluated by physical therapist for intermittent right leg weakness and leaning to the right. She denies any falls. She was taking care of her husband who has Alzheimer's disease  he is now been placed in a skilled facility. She used to walk 3 miles a day according to the patient and she had much less stress and other issues when she was more active. She remains  independent in all activities of daily living. She continues to drive without difficulty. She returns for reevaluation  HISTORY 11/06/16 PSMs Gazzillo is a pleasant 77 year old African-American lady who had a transient episode of left hemiparesis and sensory loss on 06/01/16. She states she is driving her husband when he commented that she was leaving all through the lanes. She noticed that her left leg was heavy. She was able to go to the barber shop where they were going and felt all right. But when they finished and she was driving back she noticed that her left leg again became heavy and weak. She drove instead to [facility] where she was evaluated. Her symptoms improved shop shortly after arrival. She did feel drawing of the left side of the face as well as numbness and weakness of left arm and leg. MRI scan of the brain was obtained which I personally reviewed showed no acute abnormality. MRI of the brain showed no large vessel occlusion. Carotid ultrasound showed no significant extracranial stenosis. Pounds resting echo showed normal ejection fraction. Patient was on aspirin 81 mg which was transiently 25 mg daily which is tolerating well without any stomach side effects of bruising or bleeding. The patient has not had any recurrent stroke or TIA symptoms. She was on Crestor 20 mg daily but recently her cardiologist Dr. Jacinto Halim increased it to 40 mg daily. The patient complains of intermittent right leg weakness and leaning to the right. Dr. Newt Lukes has ordered a lower extremity arterial Doppler to be performed. She  does give a history of remote back pain having epidural steroid injections in her back. She does see orthopedic physician. She denies radicular pain but does complain of pain in her leg at night. She has a remote history of possible TIA 5 years ago but she is vague about the symptoms and feels her primary physician who evaluated her felt it may have been stress related. She states she does  not have high blood pressure. She is a healthy eater and she is quite active. 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 She is independent in activities of daily living.   REVIEW OF SYSTEMS: Full 14 system review of systems performed and notable only for those listed, all others are neg:  Constitutional:Fatigue Cardiovascular : neg Ear/Nose/Throat: neg  Skin: neg Eyes: neg Respiratory: neg Gastroitestinal: neg  Hematology/Lymphatic: Easy bruising Endocrine: neg Musculoskeleta Allergy/Immunology: neg Neurological: Dizziness Psychiatric: neg Sleep : neg   ALLERGIES: Allergies  Allergen Reactions  . Atorvastatin     REACTION: myalgia  . Penicillins Hives and Itching    Has patient had a PCN reaction causing immediate rash, facial/tongue/throat swelling, SOB or lightheadedness with hypotension: no Has patient had a PCN reaction causing severe rash involving mucus membranes or skin necrosis: no Has patient had a PCN reaction that required hospitalization : no Has patient had a PCN reaction occurring within the last 10 years: no If all of the above answers are "NO", then may proceed with Cephalosporin use.     HOME MEDICATIONS: Outpatient Medications Prior to Visit  Medication Sig Dispense Refill  . Calcium Carbonate-Vitamin D (CALCIUM-VITAMIN D) 500-200 MG-UNIT tablet Take 1 tablet by mouth every evening.     . clopidogrel (PLAVIX) 75 MG tablet Take 1 tablet (75 mg total) by mouth daily. 30 tablet 11  . losartan-hydrochlorothiazide (HYZAAR) 50-12.5 MG tablet TAKE 1 TABLET BY MOUTH EVERY DAY IN THE MORNING  2  . Multiple Vitamins-Minerals (MULTIVITAMIN WITH MINERALS) tablet Take 1 tablet by mouth daily.    . Omega-3 Fatty Acids (FISH OIL) 1000 MG CAPS Take 1,000 mg by mouth daily.     . pravastatin (PRAVACHOL) 20 MG tablet Take 20 mg by mouth daily.    Marland Kitchen FLUZONE HIGH-DOSE 0.5 ML injection      No facility-administered medications prior to visit.      PAST MEDICAL HISTORY: Past Medical History:  Diagnosis Date  . Acute sinus infection 01/15/2011  . ANXIETY 10/03/2009  . BARRETTS ESOPHAGUS 10/03/2009  . CAROTID ARTERY DISEASE 08/31/2009  . COLONIC POLYPS, HX OF 08/29/2009  . CONSTIPATION, CHRONIC 10/03/2009  . DEPRESSION 10/03/2009  . DISC DISEASE, CERVICAL 08/29/2009  . DISC DISEASE, LUMBAR 08/29/2009  . Eustachian tube dysfunction 01/15/2011  . GERD 08/29/2009  . HYPERLIPIDEMIA 08/29/2009  . Impaired glucose tolerance 01/14/2011  . LEG PAIN, BILATERAL 08/29/2009  . LOW BACK PAIN, CHRONIC 11/22/2009  . PERIPHERAL NEUROPATHY 10/03/2009  . PERIPHERAL VASCULAR DISEASE 08/29/2009  . POSITIVE PPD 10/03/2009  . Preventative health care 01/14/2011  . Stroke (HCC)   . Thyroid nodule 08/09/2011  . TIA 11/22/2009  . TRANSIENT ISCHEMIC ATTACK, HX OF 08/29/2009  . URETHRAL STRICTURE 10/03/2009  . VOCAL CORD POLYP, HX OF 10/03/2009  . WRIST PAIN, BILATERAL 05/25/2010    PAST SURGICAL HISTORY: Past Surgical History:  Procedure Laterality Date  . hx of right ankle fracture    . s/p vocal cord polyps  1984   chronic hoarseness  . stress test negative  2003,2004,2006  .  TUBAL LIGATION    . tubaligation    . uterine prolapse      FAMILY HISTORY: Family History  Problem Relation Age of Onset  . Stroke Mother   . Heart disease Mother   . Hypertension Mother   . Diabetes Mother   . Multiple myeloma Sister   . Cancer Other        lung    SOCIAL HISTORY: Social History   Socioeconomic History  . Marital status: Married    Spouse name: Not on file  . Number of children: 3  . Years of education: Not on file  . Highest education level: Not on file  Social Needs  . Financial resource strain: Not on file  . Food insecurity - worry: Not on file  . Food insecurity - inability: Not on file  . Transportation needs - medical: Not on file  . Transportation needs - non-medical: Not on file  Occupational History  . Occupation: retired [employer]  Cabin crew: RETIRED  Tobacco Use  . Smoking status: Never Smoker  . Smokeless tobacco: Never Used  Substance and Sexual Activity  . Alcohol use: No  . Drug use: No  . Sexual activity: Not on file  Other Topics Concern  . Not on file  Social History Narrative   Husband with dementia     PHYSICAL EXAM  Vitals:   02/11/17 1235  BP: (!) 130/70   Pulse: (!) 51  Weight: 153 lb 6.4 oz (69.6 kg)   Body mass index is 24.73 kg/m.  Generalized: Well developed, in no acute distress  Head: normocephalic and atraumatic,. Oropharynx benign  Neck: Supple, no carotid bruits  Cardiac: Regular rate rhythm, no murmur  Musculoskeletal: No deformity   Neurological examination   Mentation: Alert oriented to time, place, history taking. Attention span and concentration appropriate. Recent and remote memory intact.  Follows all commands speech and language fluent.   Cranial nerve II-XII:Pupils were equal round reactive to light extraocular movements were full, visual field were full on confrontational test. Facial sensation and strength were normal. hearing was intact to finger rubbing bilaterally. Uvula tongue midline. head turning and shoulder shrug were normal and symmetric.Tongue protrusion into cheek strength was normal. Motor: normal bulk and tone, full strength in the BUE, BLE, fine finger movements normal, no pronator drift. No focal weakness Sensory: normal and symmetric to light touch,  Coordination: finger-nose-finger, heel-to-shin bilaterally, no dysmetria Reflexes1+ upper lower and symmetric plantar responses were flexor bilaterally. Gait and Station: Rising up from seated position without assistance, normal stance,  moderate stride, good arm swing, smooth turning, able to perform tiptoe, and heel walking without difficulty. Tandem gait is steady  DIAGNOSTIC DATA (LABS, IMAGING, TESTING) - I reviewed patient records, labs, notes, testing and imaging myself where  available.  Lab Results  Component Value Date   WBC 4.1 (A) 07/18/2017   HGB 15.0 07/18/2017   HCT 46.0 07/18/2017   MCV 94.8 07/18/2017   PLT 137 (L) 01/08/2017      Component Value Date/Time   NA 142 07/18/2017 1154   K 4.5 07/18/2017 1154   CL 101 07/18/2017 1154   CO2 25 07/18/2017 1154   GLUCOSE 86 07/18/2017 1154   GLUCOSE 94 01/08/2017 1355   BUN 13 07/18/2017 1154   CREATININE 0.92 07/18/2017 1154   CALCIUM 10.3 07/18/2017 1154   PROT 7.1 06/01/2016 1827   ALBUMIN 4.3 06/01/2016 1827   AST 29 06/01/2016 1827   ALT  24 06/01/2016 1827   ALKPHOS 57 06/01/2016 1827   BILITOT 1.0 06/01/2016 1827   GFRNONAA 60 07/18/2017 1154   GFRAA 69 07/18/2017 1154   Lab Results  Component Value Date   CHOL 235 (H) 08/22/2014   HDL 42 08/22/2014   LDLCALC 176 (H) 08/22/2014   LDLDIRECT 194.5 05/25/2010   TRIG 85 08/22/2014   CHOLHDL 5.6 08/22/2014   Lab Results  Component Value Date   HGBA1C 5.9 (H) 08/22/2014      ASSESSMENT AND PLAN 25 year lady with right hemispheric TIA in November 2017 likely due to small vessel disease. Intermittent gait instability and right leg weakness unlikely to be related to her cerebrovascular disease and possibly related to peripheral vascular disease versus lumbar spinal stenosis.  Occasional probably related to decreased fluid intake and/or blood pressure medicine since it occurs 30 minutes after taking medication.  Increased stress due to caregiving for elderly husband.     PLAN: Continue Plavix 75 mg daily  for secondary stroke prevention   maintain strict control of hypertension with blood pressure goal below 130/70, today's reading 108/54, patient has limited fluid intake except for coffee diabetes with hemoglobin A1c goal below 6.5% Lipids LDL cholesterol goal below 70 mg/dL.Continue Pravachol  I also advised the patient to eat a healthy diet with plenty of whole grains, cereals, fruits and vegetables,  exercise regularly and maintain  ideal body weight by walking 30 minutes every day Given information on dehydration and stress reduction will discharge  I spent 25 minutes in total face to face time with the patient more than 50% of which was spent counseling and coordination of care, reviewing test results reviewing medications and discussing and reviewing the diagnosis of TIA/stroke  and risk factor management for stroke as well as information about dehydration importance of fluid intake and nonpharmacologic measures for stress reduction. Nilda Riggs, [facility], [facility], APRN  [facility] [address], [address] [place], [place] [address] [phone]

## 2017-08-14 ENCOUNTER — Ambulatory Visit (INDEPENDENT_AMBULATORY_CARE_PROVIDER_SITE_OTHER): Payer: PPO | Admitting: Nurse Practitioner

## 2017-08-14 ENCOUNTER — Encounter: Payer: Self-pay | Admitting: Nurse Practitioner

## 2017-08-14 ENCOUNTER — Other Ambulatory Visit: Payer: Self-pay | Admitting: Family Medicine

## 2017-08-14 VITALS — BP 108/54 | HR 61 | Wt 153.2 lb

## 2017-08-14 DIAGNOSIS — Z1231 Encounter for screening mammogram for malignant neoplasm of breast: Secondary | ICD-10-CM

## 2017-08-14 DIAGNOSIS — R42 Dizziness and giddiness: Secondary | ICD-10-CM | POA: Diagnosis not present

## 2017-08-14 DIAGNOSIS — G459 Transient cerebral ischemic attack, unspecified: Secondary | ICD-10-CM

## 2017-08-14 DIAGNOSIS — E785 Hyperlipidemia, unspecified: Secondary | ICD-10-CM | POA: Diagnosis not present

## 2017-08-14 NOTE — Progress Notes (Signed)
I agree with the above plan 

## 2017-08-14 NOTE — Patient Instructions (Addendum)
Continue Plavix 75 mg daily  for secondary stroke prevention   maintain strict control of hypertension with blood pressure goal below 130/70, today's reading 108/54, patient has limited fluid intake except for coffee diabetes with hemoglobin A1c goal below 6.5% Lipids LDL cholesterol goal below 70 mg/dL.Continue Pravachol  I also advised the patient to eat a healthy diet with plenty of whole grains, cereals, fruits and vegetables,  exercise regularly and maintain ideal body weight by walking 30 minutes every day Given information on dehydration and stress reduction will discharge   Dehydration, Adult Dehydration is when there is not enough fluid or water in your body. This happens when you lose more fluids than you take in. Dehydration can range from mild to very bad. It should be treated right away to keep it from getting very bad. Symptoms of mild dehydration may include:  Thirst.  Dry lips.  Slightly dry mouth.  Dry, warm skin.  Dizziness. Symptoms of moderate dehydration may include:  Very dry mouth.  Muscle cramps.  Dark pee (urine). Pee may be the color of tea.  Your body making less pee.  Your eyes making fewer tears.  Heartbeat that is uneven or faster than normal (palpitations).  Headache.  Light-headedness, especially when you stand up from sitting.  Fainting (syncope). Symptoms of very bad dehydration may include:  Changes in skin, such as: ? Cold and clammy skin. ? Blotchy (mottled) or pale skin. ? Skin that does not quickly return to normal after being lightly pinched and let go (poor skin turgor).  Changes in body fluids, such as: ? Feeling very thirsty. ? Your eyes making fewer tears. ? Not sweating when body temperature is high, such as in hot weather. ? Your body making very little pee.  Changes in vital signs, such as: ? Weak pulse. ? Pulse that is more than 100 beats a minute when you are sitting still. ? Fast breathing. ? Low blood  pressure.  Other changes, such as: ? Sunken eyes. ? Cold hands and feet. ? Confusion. ? Lack of energy (lethargy). ? Trouble waking up from sleep. ? Short-term weight loss. ? Unconsciousness. Follow these instructions at home:  If told by your doctor, drink an ORS: ? Make an ORS by using instructions on the package. ? Start by drinking small amounts, about  cup (120 mL) every 5-10 minutes. ? Slowly drink more until you have had the amount that your doctor said to have.  Drink enough clear fluid to keep your pee clear or pale yellow. If you were told to drink an ORS, finish the ORS first, then start slowly drinking clear fluids. Drink fluids such as: ? Water. Do not drink only water by itself. Doing that can make the salt (sodium) level in your body get too low (hyponatremia). ? Ice chips. ? Fruit juice that you have added water to (diluted). ? Low-calorie sports drinks.  Avoid: ? Alcohol. ? Drinks that have a lot of sugar. These include high-calorie sports drinks, fruit juice that does not have water added, and soda. ? Caffeine. ? Foods that are greasy or have a lot of fat or sugar.  Take over-the-counter and prescription medicines only as told by your doctor.  Do not take salt tablets. Doing that can make the salt level in your body get too high (hypernatremia).  Eat foods that have minerals (electrolytes). Examples include bananas, oranges, potatoes, tomatoes, and spinach.  Keep all follow-up visits as told by your doctor. This is important. Contact  a doctor if:  You have belly (abdominal) pain that: ? Gets worse. ? Stays in one area (localizes).  You have a rash.  You have a stiff neck.  You get angry or annoyed more easily than normal (irritability).  You are more sleepy than normal.  You have a harder time waking up than normal.  You feel: ? Weak. ? Dizzy. ? Very thirsty.  You have peed (urinated) only a small amount of very dark pee during 6-8  hours. Get help right away if:  You have symptoms of very bad dehydration.  You cannot drink fluids without throwing up (vomiting).  Your symptoms get worse with treatment.  You have a fever.  You have a very bad headache.  You are throwing up or having watery poop (diarrhea) and it: ? Gets worse. ? Does not go away.  You have blood or something green (bile) in your throw-up.  You have blood in your poop (stool). This may cause poop to look black and tarry.  You have not peed in 6-8 hours.  You pass out (faint).  Your heart rate when you are sitting still is more than 100 beats a minute.  You have trouble breathing. This information is not intended to replace advice given to you by your health care provider. Make sure you discuss any questions you have with your health care provider. Document Released: 05/12/2009 Document Revised: 02/03/2016 Document Reviewed: 09/09/2015 Elsevier Interactive Patient Education  2018 [place] and Stress Management Stress is a normal reaction to life events. It is what you feel when life demands more than you are used to or more than you can handle. Some stress can be useful. For example, the stress reaction can help you catch the last bus of the day, study for a test, or meet a deadline at work. But stress that occurs too often or for too long can cause problems. It can affect your emotional health and interfere with relationships and normal daily activities. Too much stress can weaken your immune system and increase your risk for physical illness. If you already have a medical problem, stress can make it worse. What are the causes? All sorts of life events may cause stress. An event that causes stress for one person may not be stressful for another person. Major life events commonly cause stress. These may be positive or negative. Examples include losing your job, moving into a new home, getting married, having a baby, or losing a  loved one. Less obvious life events may also cause stress, especially if they occur day after day or in combination. Examples include working long hours, driving in traffic, caring for children, being in debt, or being in a difficult relationship. What are the signs or symptoms? Stress may cause emotional symptoms including, the following:  Anxiety. This is feeling worried, afraid, on edge, overwhelmed, or out of control.  Anger. This is feeling irritated or impatient.  Depression. This is feeling sad, down, helpless, or guilty.  Difficulty focusing, remembering, or making decisions.  Stress may cause physical symptoms, including the following:  Aches and pains. These may affect your head, neck, back, stomach, or other areas of your body.  Tight muscles or clenched jaw.  Low energy or trouble sleeping.  Stress may cause unhealthy behaviors, including the following:  Eating to feel better (overeating) or skipping meals.  Sleeping too little, too much, or both.  Working too much or putting off tasks (procrastination).  Smoking, drinking alcohol,  or using drugs to feel better.  How is this diagnosed? Stress is diagnosed through an assessment by your health care provider. Your health care provider will ask questions about your symptoms and any stressful life events.Your health care provider will also ask about your medical history and may order blood tests or other tests. Certain medical conditions and medicine can cause physical symptoms similar to stress. Mental illness can cause emotional symptoms and unhealthy behaviors similar to stress. Your health care provider may refer you to a mental health professional for further evaluation. How is this treated? Stress management is the recommended treatment for stress.The goals of stress management are reducing stressful life events and coping with stress in healthy ways. Techniques for reducing stressful life events include the  following:  Stress identification. Self-monitor for stress and identify what causes stress for you. These skills may help you to avoid some stressful events.  Time management. Set your priorities, keep a calendar of events, and learn to say "no." These tools can help you avoid making too many commitments.  Techniques for coping with stress include the following:  Rethinking the problem. Try to think realistically about stressful events rather than ignoring them or overreacting. Try to find the positives in a stressful situation rather than focusing on the negatives.  Exercise. Physical exercise can release both physical and emotional tension. The key is to find a form of exercise you enjoy and do it regularly.  Relaxation techniques. These relax the body and mind. Examples include yoga, meditation, tai chi, biofeedback, deep breathing, progressive muscle relaxation, listening to music, being out in nature, journaling, and other hobbies. Again, the key is to find one or more that you enjoy and can do regularly.  Healthy lifestyle. Eat a balanced diet, get plenty of sleep, and do not smoke. Avoid using alcohol or drugs to relax.  Strong support network. Spend time with family, friends, or other people you enjoy being around.Express your feelings and talk things over with someone you trust.  Counseling or talktherapy with a mental health professional may be helpful if you are having difficulty managing stress on your own. Medicine is typically not recommended for the treatment of stress.Talk to your health care provider if you think you need medicine for symptoms of stress. Follow these instructions at home:  Keep all follow-up visits as directed by your health care provider.  Take all medicines as directed by your health care provider. Contact a health care provider if:  Your symptoms get worse or you start having new symptoms.  You feel overwhelmed by your problems and can no longer  manage them on your own. Get help right away if:  You feel like hurting yourself or someone else. This information is not intended to replace advice given to you by your health care provider. Make sure you discuss any questions you have with your health care provider. Document Released: 01/09/2001 Document Revised: 12/22/2015 Document Reviewed: 03/10/2013 Elsevier Interactive Patient Education  2017 [employer].

## 2017-08-23 ENCOUNTER — Telehealth: Payer: Self-pay | Admitting: Family Medicine

## 2017-08-23 DIAGNOSIS — R42 Dizziness and giddiness: Secondary | ICD-10-CM | POA: Diagnosis not present

## 2017-08-23 NOTE — Telephone Encounter (Unsigned)
Copied from CRM [phone]. Topic: Quick Communication - See Telephone Encounter >> Aug 23, 2017  1:54 PM Floria RavelingStovall, Shana A wrote: CRM for notification. See Telephone encounter for: pt called in and said that she has some question about her meds that she is taking.  They are making her feel light headed   Best number [phone]  08/23/17.

## 2017-08-24 ENCOUNTER — Ambulatory Visit: Payer: PPO | Admitting: Physician Assistant

## 2017-08-24 NOTE — Telephone Encounter (Signed)
Phone call to patient. Not currently available, will attempt call again later.

## 2017-08-26 ENCOUNTER — Ambulatory Visit: Payer: Self-pay | Admitting: *Deleted

## 2017-08-26 ENCOUNTER — Encounter: Payer: Self-pay | Admitting: Family Medicine

## 2017-08-26 ENCOUNTER — Ambulatory Visit (INDEPENDENT_AMBULATORY_CARE_PROVIDER_SITE_OTHER): Payer: PPO | Admitting: Family Medicine

## 2017-08-26 ENCOUNTER — Other Ambulatory Visit: Payer: Self-pay

## 2017-08-26 VITALS — BP 108/67 | HR 66 | Temp 98.8°F | Resp 16 | Ht 66.0 in | Wt 153.0 lb

## 2017-08-26 DIAGNOSIS — R42 Dizziness and giddiness: Secondary | ICD-10-CM | POA: Diagnosis not present

## 2017-08-26 DIAGNOSIS — R0981 Nasal congestion: Secondary | ICD-10-CM

## 2017-08-26 DIAGNOSIS — I952 Hypotension due to drugs: Secondary | ICD-10-CM | POA: Diagnosis not present

## 2017-08-26 NOTE — Patient Instructions (Addendum)
1. Stop taking the blood pressure medications 2. For your nasal congestion try claritin  3. Follow up in one month for bp check    IF you received an x-ray today, you will receive an invoice from [facility]. Please contact [facility] at [phone] with questions or concerns regarding your invoice.   IF you received labwork today, you will receive an invoice from [facility]. Please contact [facility] at [phone] with questions or concerns regarding your invoice.   Our billing staff will not be able to assist you with questions regarding bills from these companies.  You will be contacted with the lab results as soon as they are available. The fastest way to get your results is to activate your My Chart account. Instructions are located on the last page of this paperwork. If you have not heard from us regarding the results in 2 weeks, please contact this office.    Dizziness Dizziness is a common problem. It is a feeling of unsteadiness or light-headedness. You may feel like you are about to faint. Dizziness can lead to injury if you stumble or fall. Anyone can become dizzy, but dizziness is more common in older adults. This condition can be caused by a number of things, including medicines, dehydration, or illness. Follow these instructions at home: Eating and drinking  Drink enough fluid to keep your urine clear or pale yellow. This helps to keep you from becoming dehydrated. Try to drink more clear fluids, such as water.  Do not drink alcohol.  Limit your caffeine intake if told to do so by your health care provider. Check ingredients and nutrition facts to see if a food or beverage contains caffeine.  Limit your salt (sodium) intake if told to do so by your health care provider. Check ingredients and nutrition facts to see if a food or beverage contains sodium. Activity  Avoid making quick movements. ? Rise slowly from chairs and steady yourself until you feel  okay. ? In the morning, first sit up on the side of the bed. When you feel okay, stand slowly while you hold onto something until you know that your balance is fine.  If you need to stand in one place for a long time, move your legs often. Tighten and relax the muscles in your legs while you are standing.  Do not drive or use heavy machinery if you feel dizzy.  Avoid bending down if you feel dizzy. Place items in your home so that they are easy for you to reach without leaning over. Lifestyle  Do not use any products that contain nicotine or tobacco, such as cigarettes and e-cigarettes. If you need help quitting, ask your health care provider.  Try to reduce your stress level by using methods such as yoga or meditation. Talk with your health care provider if you need help to manage your stress. General instructions  Watch your dizziness for any changes.  Take over-the-counter and prescription medicines only as told by your health care provider. Talk with your health care provider if you think that your dizziness is caused by a medicine that you are taking.  Tell a friend or a family member that you are feeling dizzy. If he or she notices any changes in your behavior, have this person call your health care provider.  Keep all follow-up visits as told by your health care provider. This is important. Contact a health care provider if:  Your dizziness does not go away.  Your dizziness or light-headedness  gets worse.  You feel nauseous.  You have reduced hearing.  You have new symptoms.  You are unsteady on your feet or you feel like the room is spinning. Get help right away if:  You vomit or have diarrhea and are unable to eat or drink anything.  You have problems talking, walking, swallowing, or using your arms, hands, or legs.  You feel generally weak.  You are not thinking clearly or you have trouble forming sentences. It may take a friend or family member to notice  this.  You have chest pain, abdominal pain, shortness of breath, or sweating.  Your vision changes.  You have any bleeding.  You have a severe headache.  You have neck pain or a stiff neck.  You have a fever. These symptoms may represent a serious problem that is an emergency. Do not wait to see if the symptoms will go away. Get medical help right away. Call your local emergency services (911 in the [place] Do not drive yourself to the hospital. Summary  Dizziness is a feeling of unsteadiness or light-headedness. This condition can be caused by a number of things, including medicines, dehydration, or illness.  Anyone can become dizzy, but dizziness is more common in older adults.  Drink enough fluid to keep your urine clear or pale yellow. Do not drink alcohol.  Avoid making quick movements if you feel dizzy. Monitor your dizziness for any changes. This information is not intended to replace advice given to you by your health care provider. Make sure you discuss any questions you have with your health care provider. Document Released: 01/09/2001 Document Revised: 08/18/2016 Document Reviewed: 08/18/2016 Elsevier Interactive Patient Education  [employer].

## 2017-08-26 NOTE — Progress Notes (Signed)
Chief Complaint  Patient presents with  . low blood pressure    pt has record of bp's to show provider, went to UC on [place] for low bp's, c/o sob and pain in left side of chest, per pt she thinks it may be medications interacting or counteracting against one another that are causing the low bp's.  Marland Kitchen URI    clear mucus at night, x few days    HPI   Pt reports that she went to Urgent Care on Friday 08/22/2016 for low blood pressure and feeling like she cannot breathing She takes tumeric, fish oil and coenzyme Q and Senna She states that she also stopped taking a full tablet and took 1/2 pills daily over the weekend and her bp is still low BP Readings from Last 3 Encounters:  08/26/17 108/67  08/14/17 (!) 108/54  07/18/17 130/72     She also has vaginal irritations and some acne breakouts She has not used any new detergents  She denies sexual intercourse No new fragrances She reports that she is very stressed    Past Medical History:  Diagnosis Date  . Acute sinus infection 01/15/2011  . ANXIETY 10/03/2009  . BARRETTS ESOPHAGUS 10/03/2009  . CAROTID ARTERY DISEASE 08/31/2009  . COLONIC POLYPS, HX OF 08/29/2009  . CONSTIPATION, CHRONIC 10/03/2009  . DEPRESSION 10/03/2009  . [place] DISEASE, CERVICAL 08/29/2009  . [place] DISEASE, LUMBAR 08/29/2009  . Eustachian tube dysfunction 01/15/2011  . GERD 08/29/2009  . HYPERLIPIDEMIA 08/29/2009  . Impaired glucose tolerance 01/14/2011  . LEG PAIN, BILATERAL 08/29/2009  . LOW BACK PAIN, CHRONIC 11/22/2009  . PERIPHERAL NEUROPATHY 10/03/2009  . PERIPHERAL VASCULAR DISEASE 08/29/2009  . POSITIVE PPD 10/03/2009  . Preventative health care 01/14/2011  . Stroke ([place])   . Thyroid nodule 08/09/2011  . TIA 11/22/2009  . TRANSIENT ISCHEMIC ATTACK, HX OF 08/29/2009  . URETHRAL STRICTURE 10/03/2009  . VOCAL CORD POLYP, HX OF 10/03/2009  . WRIST PAIN, BILATERAL 05/25/2010    Current Outpatient Medications  Medication Sig Dispense Refill  . Calcium  Carbonate-Vitamin D (CALCIUM-VITAMIN D) 500-200 MG-UNIT tablet Take 1 tablet by mouth every evening.     . clopidogrel (PLAVIX) 75 MG tablet Take 1 tablet (75 mg total) by mouth daily. 30 tablet 11  . Multiple Vitamins-Minerals (MULTIVITAMIN WITH MINERALS) tablet Take 1 tablet by mouth daily.    . Omega-3 Fatty Acids (FISH OIL) 1000 MG CAPS Take 1,000 mg by mouth daily.     . pravastatin (PRAVACHOL) 20 MG tablet Take 20 mg by mouth daily.     No current facility-administered medications for this visit.     Allergies:  Allergies  Allergen Reactions  . Atorvastatin     REACTION: myalgia  . Penicillins Hives and Itching    Has patient had a PCN reaction causing immediate rash, facial/tongue/throat swelling, SOB or lightheadedness with hypotension: no Has patient had a PCN reaction causing severe rash involving mucus membranes or skin necrosis: no Has patient had a PCN reaction that required hospitalization : no Has patient had a PCN reaction occurring within the last 10 years: no If all of the above answers are "NO", then may proceed with Cephalosporin use.     Past Surgical History:  Procedure Laterality Date  . hx of right ankle fracture    . s/p vocal cord polyps  1984   chronic hoarseness  . stress test negative  2003,2004,2006  . TUBAL LIGATION    . tubaligation    .  uterine prolapse      Social History   Socioeconomic History  . Marital status: Married    Spouse name: None  . Number of children: 3  . Years of education: None  . Highest education level: None  Social Needs  . Financial resource strain: None  . Food insecurity - worry: None  . Food insecurity - inability: None  . Transportation needs - medical: None  . Transportation needs - non-medical: None  Occupational History  . 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 Smoking status: Never Smoker  . Smokeless tobacco: Never Used  Substance and Sexual Activity  .  Alcohol use: No  . Drug use: No  . Sexual activity: None  Other Topics Concern  . None  Social History Narrative   Husband with dementia    Family History  Problem Relation Age of Onset  . Stroke Mother   . Heart disease Mother   . Hypertension Mother   . Diabetes Mother   . Multiple myeloma Sister   . Cancer Other        lung     ROS Review of Systems See HPI Constitution: No fevers or chills No malaise No diaphoresis Skin: No rash or itching Eyes: no blurry vision, no double vision GU: no dysuria or hematuria Neuro: no dizziness or headaches all others reviewed and negative   Objective: Vitals:   08/26/17 1709 08/26/17 1730  BP: 107/66 108/67  Pulse: 72 66  Resp: 16   Temp: 98.8 F (37.1 C)   TempSrc: Oral   SpO2: 98%   Weight: 153 lb (69.4 kg)   Height: _0  (1.676 m)     Physical Exam General: alert, oriented, in NAD Head: normocephalic, atraumatic, no sinus tenderness Eyes: EOM intact, no scleral icterus or conjunctival injection Ears: TM clear bilaterally Nose: mucosa nonerythematous, nonedematous Throat: no pharyngeal exudate or erythema Lymph: no posterior auricular, submental or cervical lymph adenopathy Heart: normal rate, normal sinus rhythm, no murmurs Lungs: clear to auscultation bilaterally, no wheezing Chest: no palpable area of tenderness  Assessment and Plan [name] was seen today for low blood pressure and uri.  Diagnoses and all orders for this visit:  Hypotension due to drugs -  Given patient's age and her hypotension will stop all meds and have pt return for bp check  Dizziness -  Acute on chronic symptoms -  Will stop bp meds  Nasal congestion -  Advised waiting before taking antihistamine  Other orders -     Cancel: Flu Vaccine QUAD 36+ mos IM     [name] A [name]

## 2017-08-26 NOTE — Telephone Encounter (Signed)
Pt  Was   Seen  3  Days  Ago  At  Urgent care  For  Dizziness    Over  The  Weekend   She  Was   Weak   Dizzy  And  Tired .  She  States  Her  bp was  Low over  The  Weekend   She  Reports  She  Feels  Better  At this  Time.  She  Has an  appt today  With  Dr Creta Levinstallings  At 440 pm    Made  By  Agent prior to  Sending  The   Call  To nurse  Triage  . Pt  Was  Advised  To   Rest  Until  Her  Appointment     Not to get  Up  To  Fast . She  Was  Advised  To  Call back  If  Any  furthur  Symptoms or call  911  If any  Severe  Distress .   Reason for Disposition . [1] Systolic BP 90-110 AND [2] taking blood pressure medications AND [3] NOT dizzy, lightheaded or weak  Answer Assessment - Initial Assessment Questions 1. BLOOD PRESSURE: "What is the blood pressure?" "Did you take at least two measurements 5 minutes apart?"     119/75   Yes  121/81    2. ONSET: "When did you take your blood pressure?"     0930 3. HOW: "How did you obtain the blood pressure?" (e.g., visiting nurse, automatic home BP monitor)     Machine    4. HISTORY: "Do you have a history of low blood pressure?" "What is your blood pressure normally?"     Yes     112/and   118     Systolic    5. MEDICATIONS: "Are you taking any medications for blood pressure?" If yes: "Have they been changed recently?"       Do     6. PULSE RATE: "Do you know what your pulse rate is?"         72   7. OTHER SYMPTOMS: "Have you been sick recently?" "Have you had a recent injury?"       Was  Dizzy  sev  Days  Ago    Denies  Any  Vomiting  Or  Diarrhea     No  Injury   8. PREGNANCY: "Is there any chance you are pregnant?" "When was your last menstrual period?"      N/a  Protocols used: LOW BLOOD PRESSURE-A-AH

## 2017-08-29 NOTE — Telephone Encounter (Signed)
Pt seen 12/8

## 2017-09-03 ENCOUNTER — Telehealth: Payer: Self-pay | Admitting: Family Medicine

## 2017-09-03 ENCOUNTER — Ambulatory Visit
Admission: RE | Admit: 2017-09-03 | Discharge: 2017-09-03 | Disposition: A | Discharge status: Home / Self Care | Payer: PPO | Source: Ambulatory Visit | Attending: Family Medicine | Admitting: Family Medicine

## 2017-09-03 DIAGNOSIS — Z1231 Encounter for screening mammogram for malignant neoplasm of breast: Secondary | ICD-10-CM

## 2017-09-03 NOTE — Telephone Encounter (Signed)
Called pt to try and reschedule her appt with Dr. Creta LevinStallings on 09/26/17. She will not be in the office in the AM hours but will be in the office in the PM hours.  Pt was driving and wanted to call back and reschedule.   When she calls back, please reschedule her for either later that day or a different day with Dr. Creta LevinStallings.  Thanks!

## 2017-09-06 ENCOUNTER — Telehealth: Payer: Self-pay | Admitting: Family Medicine

## 2017-09-06 MED ORDER — OSELTAMIVIR PHOSPHATE 75 MG PO CAPS: 75.0000 mg | ORAL_CAPSULE | Freq: Two times a day (BID) | ORAL | 0 refills | Status: DC

## 2017-09-06 NOTE — Telephone Encounter (Signed)
Copied from CRM [id number]. Topic: Quick Communication - Rx Refill/Question >> Sep 06, 2017  4:19 PM Cipriano BunkerLambe, Annette S wrote:  Medication: Tamaflu  Husband went to hospital and has the flu,  doctor said for her to call PCP and get on Tamaflu  Has the patient contacted their pharmacy? No.   (Agent: If no, request that the patient contact the pharmacy for the refill.)   Preferred Pharmacy (with phone number or street name): [id number] [place], [place] - [address] [place] [id number] Phone: [phone] Fax: [phone]  Agent: Please be advised that RX refills may take up to 3 business days. We ask that you follow-up with your pharmacy.

## 2017-09-06 NOTE — Telephone Encounter (Signed)
Pt   Called  Stating  Her husband   Was  Recently diagnosed  With  The   Flu    Pt  Was   Told  By   Her  Husbands   Doctor  That    She  Needed  To  Be  Started  On Tamiflu  As   Well    3  Way   With  tasha  At  [facility]    Who  Will  Make  Sure  The  Request  Will  Get  Routed  And  Will  Call her  Back .

## 2017-09-06 NOTE — Telephone Encounter (Signed)
Spoke with pt and she needs Rx for Tamiflu to be sent her pharmacy  her husband just tested positive for Flu

## 2017-09-26 ENCOUNTER — Ambulatory Visit: Payer: PPO | Admitting: Family Medicine

## 2017-09-26 ENCOUNTER — Other Ambulatory Visit: Payer: Self-pay

## 2017-09-26 ENCOUNTER — Ambulatory Visit (INDEPENDENT_AMBULATORY_CARE_PROVIDER_SITE_OTHER): Payer: PPO | Admitting: Family Medicine

## 2017-09-26 ENCOUNTER — Encounter: Payer: Self-pay | Admitting: Family Medicine

## 2017-09-26 VITALS — BP 120/82 | HR 79 | Temp 100.9°F | Resp 16 | Ht 66.0 in | Wt 152.6 lb

## 2017-09-26 DIAGNOSIS — R6889 Other general symptoms and signs: Secondary | ICD-10-CM | POA: Diagnosis not present

## 2017-09-26 DIAGNOSIS — J101 Influenza due to other identified influenza virus with other respiratory manifestations: Secondary | ICD-10-CM | POA: Diagnosis not present

## 2017-09-26 LAB — POC INFLUENZA A&B (BINAX/QUICKVUE)
Influenza A, POC: POSITIVE — AB
Influenza B, POC: NEGATIVE

## 2017-09-26 MED ORDER — OSELTAMIVIR PHOSPHATE 75 MG PO CAPS: 75.0000 mg | ORAL_CAPSULE | Freq: Two times a day (BID) | ORAL | 0 refills | Status: AC

## 2017-09-26 NOTE — Patient Instructions (Addendum)
If you have thyroid disease, diabetes, hypertension, tachycardia or seizure disorder If you take heart meds or ADD meds   You should not take Dextromorphan or Phenylephrine These are common found in decongestant and cold medications This can cause very high pulses and high blood pressure.  Ask you pharmacist to make sure this ingredient is not present     IF you received an x-ray today, you will receive an invoice from [facility]. Please contact [facility] at [phone] with questions or concerns regarding your invoice.   IF you received labwork today, you will receive an invoice from [facility]. Please contact [facility] at [phone] with questions or concerns regarding your invoice.   Our billing staff will not be able to assist you with questions regarding bills from these companies.  You will be contacted with the lab results as soon as they are available. The fastest way to get your results is to activate your My Chart account. Instructions are located on the last page of this paperwork. If you have not heard from us regarding the results in 2 weeks, please contact this office.     Influenza, Adult Influenza, more commonly known as "the flu," is a viral infection that primarily affects the respiratory tract. The respiratory tract includes organs that help you breathe, such as the lungs, nose, and throat. The flu causes many common cold symptoms, as well as a high fever and body aches. The flu spreads easily from person to person (is contagious). Getting a flu shot (influenza vaccination) every year is the best way to prevent influenza. What are the causes? Influenza is caused by a virus. You can catch the virus by:  Breathing in droplets from an infected person's cough or sneeze.  Touching something that was recently contaminated with the virus and then touching your mouth, nose, or eyes.  What increases the risk? The following factors may make you more  likely to get the flu:  Not cleaning your hands frequently with soap and water or alcohol-based hand sanitizer.  Having close contact with many people during cold and flu season.  Touching your mouth, eyes, or nose without washing or sanitizing your hands first.  Not drinking enough fluids or not eating a healthy diet.  Not getting enough sleep or exercise.  Being under a high amount of stress.  Not getting a yearly (annual) flu shot.  You may be at a higher risk of complications from the flu, such as a severe lung infection (pneumonia), if you:  Are over the age of 79.  Are pregnant.  Have a weakened disease-fighting system (immune system). You may have a weakened immune system if you: ? Have HIV or AIDS. ? Are undergoing chemotherapy. ? Aretaking medicines that reduce the activity of (suppress) the immune system.  Have a long-term (chronic) illness, such as heart disease, kidney disease, diabetes, or lung disease.  Have a liver disorder.  Are obese.  Have anemia.  What are the signs or symptoms? Symptoms of this condition typically last 4-10 days and may include:  Fever.  Chills.  Headache, body aches, or muscle aches.  Sore throat.  Cough.  Runny or congested nose.  Chest discomfort and cough.  Poor appetite.  Weakness or tiredness (fatigue).  Dizziness.  Nausea or vomiting.  How is this diagnosed? This condition may be diagnosed based on your medical history and a physical exam. Your health care provider may do a nose or throat swab test to confirm the diagnosis. How is  this treated? If influenza is detected early, you can be treated with antiviral medicine that can reduce the length of your illness and the severity of your symptoms. This medicine may be given by mouth (orally) or through an IV tube that is inserted in one of your veins. The goal of treatment is to relieve symptoms by taking care of yourself at home. This may include taking  over-the-counter medicines, drinking plenty of fluids, and adding humidity to the air in your home. In some cases, influenza goes away on its own. Severe influenza or complications from influenza may be treated in a hospital. Follow these instructions at home:  Take over-the-counter and prescription medicines only as told by your health care provider.  Use a cool mist humidifier to add humidity to the air in your home. This can make breathing easier.  Rest as needed.  Drink enough fluid to keep your urine clear or pale yellow.  Cover your mouth and nose when you cough or sneeze.  Wash your hands with soap and water often, especially after you cough or sneeze. If soap and water are not available, use hand sanitizer.  Stay home from work or school as told by your health care provider. Unless you are visiting your health care provider, try to avoid leaving home until your fever has been gone for 24 hours without the use of medicine.  Keep all follow-up visits as told by your health care provider. This is important. How is this prevented?  Getting an annual flu shot is the best way to avoid getting the flu. You may get the flu shot in late summer, fall, or winter. Ask your health care provider when you should get your flu shot.  Wash your hands often or use hand sanitizer often.  Avoid contact with people who are sick during cold and flu season.  Eat a healthy diet, drink plenty of fluids, get enough sleep, and exercise regularly. Contact a health care provider if:  You develop new symptoms.  You have: ? Chest pain. ? Diarrhea. ? A fever.  Your cough gets worse.  You produce more mucus.  You feel nauseous or you vomit. Get help right away if:  You develop shortness of breath or difficulty breathing.  Your skin or nails turn a bluish color.  You have severe pain or stiffness in your neck.  You develop a sudden headache or sudden pain in your face or ear.  You cannot  stop vomiting. This information is not intended to replace advice given to you by your health care provider. Make sure you discuss any questions you have with your health care provider. Document Released: 07/13/2000 Document Revised: 12/22/2015 Document Reviewed: 05/10/2015 Elsevier Interactive Patient Education  2017 [employer].

## 2017-09-26 NOTE — Progress Notes (Signed)
Chief Complaint  Patient presents with  . Flu Vaccine    flu like symptoms x since tuesday night.  Last night steady coughing, sneezing, rn and chills. Fever of 101 today, bodyaches.    HPI   Pt is taking tylenol extra strength for fevers for pain She has been having fevers  She was given tamiflu for prophylaxis when he husband had the flu 2 weeks ago  She reports that she hurts all over   No nausea, vomiting or diarrhea No rash or skin lesion  Past Medical History:  Diagnosis Date  . Acute sinus infection 01/15/2011  . ANXIETY 10/03/2009  . BARRETTS ESOPHAGUS 10/03/2009  . CAROTID ARTERY DISEASE 08/31/2009  . COLONIC POLYPS, HX OF 08/29/2009  . CONSTIPATION, CHRONIC 10/03/2009  . DEPRESSION 10/03/2009  . [place] DISEASE, CERVICAL 08/29/2009  . [place] DISEASE, LUMBAR 08/29/2009  . Eustachian tube dysfunction 01/15/2011  . GERD 08/29/2009  . HYPERLIPIDEMIA 08/29/2009  . Impaired glucose tolerance 01/14/2011  . LEG PAIN, BILATERAL 08/29/2009  . LOW BACK PAIN, CHRONIC 11/22/2009  . PERIPHERAL NEUROPATHY 10/03/2009  . PERIPHERAL VASCULAR DISEASE 08/29/2009  . POSITIVE PPD 10/03/2009  . Preventative health care 01/14/2011  . Stroke ([place])   . Thyroid nodule 08/09/2011  . TIA 11/22/2009  . TRANSIENT ISCHEMIC ATTACK, HX OF 08/29/2009  . URETHRAL STRICTURE 10/03/2009  . VOCAL CORD POLYP, HX OF 10/03/2009  . WRIST PAIN, BILATERAL 05/25/2010    Current Outpatient Medications  Medication Sig Dispense Refill  . Calcium Carbonate-Vitamin D (CALCIUM-VITAMIN D) 500-200 MG-UNIT tablet Take 1 tablet by mouth every evening.     . clopidogrel (PLAVIX) 75 MG tablet Take 1 tablet (75 mg total) by mouth daily. 30 tablet 11  . Multiple Vitamins-Minerals (MULTIVITAMIN WITH MINERALS) tablet Take 1 tablet by mouth daily.    . Omega-3 Fatty Acids (FISH OIL) 1000 MG CAPS Take 1,000 mg by mouth daily.     Marland Kitchen oseltamivir (TAMIFLU) 75 MG capsule Take 1 capsule (75 mg total) by mouth 2 (two) times daily for 5 days. 10 capsule 0  .  pravastatin (PRAVACHOL) 20 MG tablet Take 20 mg by mouth daily.     No current facility-administered medications for this visit.     Allergies:  Allergies  Allergen Reactions  . Atorvastatin     REACTION: myalgia  . Penicillins Hives and Itching    Has patient had a PCN reaction causing immediate rash, facial/tongue/throat swelling, SOB or lightheadedness with hypotension: no Has patient had a PCN reaction causing severe rash involving mucus membranes or skin necrosis: no Has patient had a PCN reaction that required hospitalization : no Has patient had a PCN reaction occurring within the last 10 years: no If all of the above answers are "NO", then may proceed with Cephalosporin use.     Past Surgical History:  Procedure Laterality Date  . hx of right ankle fracture    . s/p vocal cord polyps  1984   chronic hoarseness  . stress test negative  2003,2004,2006  . TUBAL LIGATION    . tubaligation    . uterine prolapse      Social History   Socioeconomic History  . Marital status: Married    Spouse name: None  . Number of children: 3  . Years of education: None  . Highest education level: None  Social Needs  . Financial resource strain: None  . Food insecurity - worry: None  . Food insecurity - inability: None  . Transportation needs - medical:  None  . Transportation needs - non-medical: None  Occupational History  . 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 Smoking status: Never Smoker  . Smokeless tobacco: Never Used  Substance and Sexual Activity  . Alcohol use: No  . Drug use: No  . Sexual activity: None  Other Topics Concern  . None  Social History Narrative   Husband with dementia    Family History  Problem Relation Age of Onset  . Stroke Mother   . Heart disease Mother   . Hypertension Mother   . Diabetes Mother   . Multiple myeloma Sister   . Cancer Other        lung     ROS Review of Systems See  HPI Constitution: + fevers +chills +malaise + diaphoresis Skin: No rash or itching Eyes: no blurry vision, no double vision GU: no dysuria or hematuria Neuro: no dizziness or headaches  all others reviewed and negative   Objective: Vitals:   09/26/17 1614  BP: 120/82  Pulse: 79  Resp: 16  Temp: (!) 100.9 F (38.3 C)  TempSrc: Oral  SpO2: 99%  Weight: 152 lb 9.6 oz (69.2 kg)  Height: '5\' 6"'  (1.676 m)    Physical Exam General: alert, oriented, in NAD Head: normocephalic, atraumatic, no sinus tenderness Eyes: EOM intact, no scleral icterus or conjunctival injection Ears: TM clear bilaterally Nose: mucosa nonerythematous, nonedematous Throat: no pharyngeal exudate or erythema Lymph: no posterior auricular, submental or cervical lymph adenopathy Heart: normal rate, normal sinus rhythm, no murmurs Lungs: clear to auscultation bilaterally, no wheezing  Component     Latest Ref Rng & Units 09/26/2017  Influenza A, POC     Negative Positive (A)  Influenza B, POC     Negative Negative    Assessment and Plan Shaelynn was seen today for flu vaccine.  Diagnoses and all orders for this visit:  Type A influenza- repeated tamiflu at treatment dose Supportive care advised    Flu-like symptoms -     POC Influenza A&B(BINAX/QUICKVUE)  Other orders -     Cancel: Flu Vaccine QUAD 36+ mos IM -     oseltamivir (TAMIFLU) 75 MG capsule; Take 1 capsule (75 mg total) by mouth 2 (two) times daily for 5 days.     [place]

## 2017-10-12 ENCOUNTER — Other Ambulatory Visit: Payer: Self-pay | Admitting: Neurology

## 2017-10-14 ENCOUNTER — Other Ambulatory Visit: Payer: Self-pay

## 2017-10-14 MED ORDER — CLOPIDOGREL BISULFATE 75 MG PO TABS: 75.0000 mg | ORAL_TABLET | Freq: Every day | ORAL | 1 refills | Status: DC

## 2017-10-15 ENCOUNTER — Encounter: Payer: Self-pay | Admitting: Urgent Care

## 2017-10-15 ENCOUNTER — Ambulatory Visit (INDEPENDENT_AMBULATORY_CARE_PROVIDER_SITE_OTHER): Payer: PPO | Admitting: Urgent Care

## 2017-10-15 VITALS — BP 103/65 | HR 77 | Temp 98.1°F | Resp 17 | Ht 66.5 in | Wt 155.0 lb

## 2017-10-15 DIAGNOSIS — Z636 Dependent relative needing care at home: Secondary | ICD-10-CM

## 2017-10-15 DIAGNOSIS — R42 Dizziness and giddiness: Secondary | ICD-10-CM | POA: Diagnosis not present

## 2017-10-15 DIAGNOSIS — I952 Hypotension due to drugs: Secondary | ICD-10-CM | POA: Diagnosis not present

## 2017-10-15 DIAGNOSIS — J101 Influenza due to other identified influenza virus with other respiratory manifestations: Secondary | ICD-10-CM | POA: Diagnosis not present

## 2017-10-15 DIAGNOSIS — R059 Cough, unspecified: Secondary | ICD-10-CM

## 2017-10-15 DIAGNOSIS — R05 Cough: Secondary | ICD-10-CM

## 2017-10-15 MED ORDER — AMLODIPINE BESYLATE 5 MG PO TABS: 5.0000 mg | ORAL_TABLET | Freq: Every day | ORAL | 1 refills | Status: DC

## 2017-10-15 NOTE — Patient Instructions (Addendum)
Hydrate well with at least 48 ounces of water daily. Use Mucinex and Tessalon for your cough.    Hypotension Hypotension, commonly called low blood pressure, is when the force of blood pumping through your arteries is too weak. Arteries are blood vessels that carry blood from the heart throughout the body. When blood pressure is too low, you may not get enough blood to your brain or to the rest of your organs. This can cause weakness, light-headedness, rapid heartbeat, and fainting. Depending on the cause and severity, hypotension may be harmless (benign) or cause serious problems (critical). What are the causes? Possible causes of hypotension include:  Blood loss.  Loss of body fluids (dehydration).  Heart problems.  Hormone (endocrine) problems.  Pregnancy.  Severe infection.  Lack of certain nutrients.  Severe allergic reactions (anaphylaxis).  Certain medicines, such as blood pressure medicine or medicines that make the body lose excess fluids (diuretics). Sometimes, hypotension can be caused by not taking medicine as directed, such as taking too much of a certain medicine.  What increases the risk? Certain factors can make you more likely to develop hypotension, including:  Age. Risk increases as you get older.  Conditions that affect the heart or the central nervous system.  Taking certain medicines, such as blood pressure medicine or diuretics.  Being pregnant.  What are the signs or symptoms? Symptoms of this condition may include:  Weakness.  Light-headedness.  Dizziness.  Blurred vision.  Fatigue.  Rapid heartbeat.  Fainting, in severe cases.  How is this diagnosed? This condition is diagnosed based on:  Your medical history.  Your symptoms.  Your blood pressure measurement. Your health care provider will check your blood pressure when you are: ? Lying down. ? Sitting. ? Standing.  A blood pressure reading is recorded as two numbers, such  as "120 over 80" (or 120/80). The first ("top") number is called the systolic pressure. It is a measure of the pressure in your arteries as your heart beats. The second ("bottom") number is called the diastolic pressure. It is a measure of the pressure in your arteries when your heart relaxes between beats. Blood pressure is measured in a unit called mm Hg. Healthy blood pressure for adults is 120/80. If your blood pressure is below 90/60, you may be diagnosed with hypotension. Other information or tests that may be used to diagnose hypotension include:  Your other vital signs, such as your heart rate and temperature.  Blood tests.  Tilt table test. For this test, you will be safely secured to a table that moves you from a lying position to an upright position. Your heart rhythm and blood pressure will be monitored during the test.  How is this treated? Treatment for this condition may include:  Changing your diet. This may involve eating more salt (sodium) or drinking more water.  Taking medicines to raise your blood pressure.  Changing the dosage of certain medicines you are taking that might be lowering your blood pressure.  Wearing compression stockings. These stockings help to prevent blood clots and reduce swelling in your legs.  In some cases, you may need to go to the hospital for:  Fluid replacement. This means you will receive fluids through an IV tube.  Blood replacement. This means you will receive donated blood through an IV tube (transfusion).  Treating an infection or heart problems, if this applies.  Monitoring. You may need to be monitored while medicines that you are taking wear off.  Follow these  instructions at home: Eating and drinking   Drink enough fluid to keep your urine clear or pale yellow.  Eat a healthy diet and follow instructions from your health care provider about eating or drinking restrictions. A healthy diet includes: ? Fresh fruits and  vegetables. ? Whole grains. ? Lean meats. ? Low-fat dairy products.  Eat extra salt only as directed. Do not add extra salt to your diet unless your health care provider told you to do that.  Eat frequent, small meals.  Avoid standing up suddenly after eating. Medicines  Take over-the-counter and prescription medicines only as told by your health care provider. ? Follow instructions from your health care provider about changing the dosage of your current medicines, if this applies. ? Do not stop or adjust any of your medicines on your own. General instructions  Wear compression stockings as told by your health care provider.  Get up slowly from lying down or sitting positions. This gives your blood pressure a chance to adjust.  Avoid hot showers and excessive heat as directed by your health care provider.  Return to your normal activities as told by your health care provider. Ask your health care provider what activities are safe for you.  Do not use any products that contain nicotine or tobacco, such as cigarettes and e-cigarettes. If you need help quitting, ask your health care provider.  Keep all follow-up visits as told by your health care provider. This is important. Contact a health care provider if:  You vomit.  You have diarrhea.  You have a fever for more than 2-3 days.  You feel more thirsty than usual.  You feel weak and tired. Get help right away if:  You have chest pain.  You have a fast or irregular heartbeat.  You develop numbness in any part of your body.  You cannot move your arms or your legs.  You have trouble speaking.  You become sweaty or feel light-headed.  You faint.  You feel short of breath.  You have trouble staying awake.  You feel confused. This information is not intended to replace advice given to you by your health care provider. Make sure you discuss any questions you have with your health care provider. Document Released:  07/16/2005 Document Revised: 02/03/2016 Document Reviewed: 01/05/2016 Elsevier Interactive Patient Education  2018 [employer].     IF you received an x-ray today, you will receive an invoice from [facility]. Please contact [facility] at [phone] with questions or concerns regarding your invoice.   IF you received labwork today, you will receive an invoice from [facility] [place]. Please contact [facility] at [phone] with questions or concerns regarding your invoice.   Our billing staff will not be able to assist you with questions regarding bills from these companies.  You will be contacted with the lab results as soon as they are available. The fastest way to get your results is to activate your My Chart account. Instructions are located on the last page of this paperwork. If you have not heard from [place] regarding the results in 2 weeks, please contact this office.

## 2017-10-15 NOTE — Progress Notes (Signed)
MRN: [id number] DOB: [birth year]  Subjective:   [name] is a 77 y.o. female presenting for dizziness, vertigo this morning. Reports that she checked her blood pressure and was in the low 90's. Admits that she has had this issue recently and was related to her blood pressure medications. Patient was treated for Influenza at the end of February. She has had intermittent improvement in her symptoms. Reports ongoing productive cough, need to clear her throat. Denies headache, chest pain, n/v, abdominal pain, hematuria. Admits stress with taking care of her husband, has Alzheimer's disease and is wondering if this can affect her blood pressure. Patient drinks ~8 ounces of water daily, drinks coffee and juice instead.  [name] has a current medication list which includes the following prescription(s): clopidogrel, coenzyme q10, losartan-hydrochlorothiazide, magnesium oxide, naproxen sodium, fish oil, rosuvastatin, turmeric, calcium-vitamin d, multivitamin with minerals, and pravastatin. Also is allergic to atorvastatin and penicillins.  [name]  has a past medical history of Acute sinus infection (01/15/2011), ANXIETY (10/03/2009), BARRETTS ESOPHAGUS (10/03/2009), CAROTID ARTERY DISEASE (08/31/2009), COLONIC POLYPS, HX OF (08/29/2009), CONSTIPATION, CHRONIC (10/03/2009), DEPRESSION (10/03/2009), DISC DISEASE, CERVICAL (08/29/2009), DISC DISEASE, LUMBAR (08/29/2009), Eustachian tube dysfunction (01/15/2011), GERD (08/29/2009), HYPERLIPIDEMIA (08/29/2009), Impaired glucose tolerance (01/14/2011), LEG PAIN, BILATERAL (08/29/2009), LOW BACK PAIN, CHRONIC (11/22/2009), PERIPHERAL NEUROPATHY (10/03/2009), PERIPHERAL VASCULAR DISEASE (08/29/2009), POSITIVE PPD (10/03/2009), Preventative health care (01/14/2011), Stroke [facility]), Thyroid nodule (08/09/2011), TIA (11/22/2009), TRANSIENT ISCHEMIC ATTACK, HX OF (08/29/2009), URETHRAL STRICTURE (10/03/2009), VOCAL CORD POLYP, HX OF (10/03/2009), and WRIST PAIN, BILATERAL (05/25/2010). Also  has a past  surgical history that includes s/p vocal cord polyps (1984); Tubal ligation; stress test negative (2003,2004,2006); hx of right ankle fracture; uterine prolapse; and tubaligation.  Objective:   Vitals: BP 103/65   Pulse 77   Temp 98.1 F (36.7 C) (Oral)   Resp 17   Ht 5' 6.5" (1.689 m)   Wt 155 lb (70.3 kg)   SpO2 98%   BMI 24.64 kg/m   BP Readings from Last 3 Encounters:  10/15/17 103/65  09/26/17 120/82  08/26/17 108/67    Physical Exam  Constitutional: She is oriented to person, place, and time. She appears well-developed and well-nourished.  HENT:  Mouth/Throat: Oropharynx is clear and moist.  Eyes: EOM are normal. Pupils are equal, round, and reactive to light.  Cardiovascular: Normal rate, regular rhythm and intact distal pulses. Exam reveals no gallop and no friction rub.  No murmur heard. Pulmonary/Chest: Effort normal. No respiratory distress. She has no wheezes. She has no rales.  Neurological: She is alert and oriented to person, place, and time. No cranial nerve deficit.  Skin: Skin is warm and dry.  Psychiatric: She has a normal mood and affect.   Assessment and Plan :   Dizziness  Cough  Type A influenza  Hypotension due to medication  Caregiver stress  Will stop losartan-HCTZ, start amlodipine. Recommended better hydration. A&P in 07/2017 was to stop her BP medications but patient does not recall being asked to restart them and cannot remember if she did stop them to begin with. Patient will recheck with Dr. Creta Levin. ER and return-to-clinic precautions discussed, patient verbalized understanding.   Wallis Bamberg, PA-C [facility] at [facility] [phone] 10/15/2017  1:56 PM

## 2017-10-16 ENCOUNTER — Telehealth: Payer: Self-pay | Admitting: Family Medicine

## 2017-10-16 NOTE — Telephone Encounter (Signed)
Copied from CRM [phone]. Topic: Quick Communication - Rx Refill/Question >> Oct 16, 2017 12:53 PM Crist InfanteHarrald, [name] wrote: Medication:   amLODipine (NORVASC) 5 MG tablet  Pt states she was seen yesterday for low blood pressure.  When she went to pick up the Rx, the pharmacist and advised this med for high bp.  Pt states she does not have high bp. Pt did not pick up and needs advice on what to do now.  Pt states she will be home after 2 pm.

## 2017-10-16 NOTE — Telephone Encounter (Signed)
Patient called with questions about her BP medication. She says "when I was in the office on yesterday for low BP, the doctor took me off of the BP medication. Then when I went to the pharmacy to pick up my prescription, he told me the medicine was for high BP. I left it there and called you because I was confused about it." I advised that Wallis BambergMario Mani took her off the combination BP med, which is 2 in 1, and put her on 1 BP medication, amlodipine. I advised that the losartan was undoubtedly too strong and she didn't need it because one component is a diuretic and it will lower the BP along with the other component. She verbalized understanding and says "I will go pick it up and start taking it."  I also advised her to keep a record of her BP readings and if they are below 100, then she should not take the BP medication that day. She verbalized understanding.

## 2017-10-16 NOTE — Telephone Encounter (Signed)
Copied from CRM [phone]. Topic: Quick Communication - Rx Refill/Question >> Oct 16, 2017 12:53 PM [name], [name] wrote: Medication:   amLODipine (NORVASC) 5 MG tablet  Pt states she was seen yesterday for low blood pressure.  When she went to pick up the Rx, the pharmacist and advised this med for high bp.  Pt states she does not have high bp. Pt did not pick up and needs advice on what to do now.  >> Oct 16, 2017  4:55 PM [name], [name] wrote: Pt is confused on her medication.  If she should stop old and start with the new BP.   Pt needs a call back asap to discuss.

## 2017-10-17 NOTE — Telephone Encounter (Signed)
Please advise 

## 2017-10-18 NOTE — Telephone Encounter (Signed)
I discussed this with patient in clinic. We stopped 2 blood pressure medications. I did not want the patient to be without any blood pressure medications given her history of stroke. She is supposed to follow up with Dr. Creta LevinStallings for further management of her blood pressure.

## 2017-10-21 NOTE — Telephone Encounter (Signed)
Phone call to patient, unable to reach.  If patient calls back, please relay message regarding amlodipine [place], [place] below.

## 2017-10-22 DIAGNOSIS — I6523 Occlusion and stenosis of bilateral carotid arteries: Secondary | ICD-10-CM | POA: Diagnosis not present

## 2017-10-23 ENCOUNTER — Other Ambulatory Visit: Payer: Self-pay

## 2017-10-23 ENCOUNTER — Encounter: Payer: Self-pay | Admitting: Family Medicine

## 2017-10-23 ENCOUNTER — Ambulatory Visit (INDEPENDENT_AMBULATORY_CARE_PROVIDER_SITE_OTHER): Payer: PPO | Admitting: Family Medicine

## 2017-10-23 VITALS — BP 108/70 | HR 74 | Temp 98.7°F | Resp 16 | Ht 66.5 in | Wt 152.2 lb

## 2017-10-23 DIAGNOSIS — I952 Hypotension due to drugs: Secondary | ICD-10-CM | POA: Diagnosis not present

## 2017-10-23 DIAGNOSIS — R42 Dizziness and giddiness: Secondary | ICD-10-CM

## 2017-10-23 NOTE — Progress Notes (Signed)
Chief Complaint  Patient presents with  . Hypotension    follow up    HPI   Hypotension and dizziness Pt reports that she was having low blood pressures She states that she went to see Jaynee Eagles for dizziness  She was switched from losartan-hctz to amlopidine which did not make a difference She saw her Cardiologist and she was advised to manage her stress Her husband will be hospitalized due to poor circulation which might require an amputation She states that she drinks plenty of fluids now to increase her hydration BP Readings from Last 3 Encounters:  10/23/17 108/70  10/15/17 103/65  09/26/17 120/82    Right knee pain She gets weakness of the right hand and this makes it difficulty to turn keys She states that she also has pain in her right knee She states that it is concerning that her right neck, right shoulder, right wrist, right hip and knee    Past Medical History:  Diagnosis Date  . Acute sinus infection 01/15/2011  . ANXIETY 10/03/2009  . BARRETTS ESOPHAGUS 10/03/2009  . CAROTID ARTERY DISEASE 08/31/2009  . COLONIC POLYPS, HX OF 08/29/2009  . CONSTIPATION, CHRONIC 10/03/2009  . DEPRESSION 10/03/2009  . [place] DISEASE, CERVICAL 08/29/2009  . [place] DISEASE, LUMBAR 08/29/2009  . Eustachian tube dysfunction 01/15/2011  . GERD 08/29/2009  . HYPERLIPIDEMIA 08/29/2009  . Impaired glucose tolerance 01/14/2011  . LEG PAIN, BILATERAL 08/29/2009  . LOW BACK PAIN, CHRONIC 11/22/2009  . PERIPHERAL NEUROPATHY 10/03/2009  . PERIPHERAL VASCULAR DISEASE 08/29/2009  . POSITIVE PPD 10/03/2009  . Preventative health care 01/14/2011  . Stroke ([place])   . Thyroid nodule 08/09/2011  . TIA 11/22/2009  . TRANSIENT ISCHEMIC ATTACK, HX OF 08/29/2009  . URETHRAL STRICTURE 10/03/2009  . VOCAL CORD POLYP, HX OF 10/03/2009  . WRIST PAIN, BILATERAL 05/25/2010    Current Outpatient Medications  Medication Sig Dispense Refill  . amLODipine (NORVASC) 5 MG tablet Take 1 tablet (5 mg total) by mouth daily. 90 tablet 1   . Calcium Carbonate-Vitamin D (CALCIUM-VITAMIN D) 500-200 MG-UNIT tablet Take 1 tablet by mouth every evening.     . clopidogrel (PLAVIX) 75 MG tablet Take 1 tablet (75 mg total) by mouth daily. 90 tablet 1  . Coenzyme Q10 10 MG capsule Take 200 mg by mouth daily.    . magnesium oxide (MAG-OX) 400 MG tablet Take 1 tablet by mouth daily.  4  . Multiple Vitamins-Minerals (MULTIVITAMIN WITH MINERALS) tablet Take 1 tablet by mouth daily.    . naproxen sodium (ALEVE) 220 MG tablet Take 220 mg by mouth.    . Omega-3 Fatty Acids (FISH OIL) 1000 MG CAPS Take 1,000 mg by mouth daily.     . pravastatin (PRAVACHOL) 20 MG tablet Take 20 mg by mouth daily.    . rosuvastatin (CRESTOR) 40 MG tablet     . Turmeric 400 MG CAPS Take by mouth.     No current facility-administered medications for this visit.     Allergies:  Allergies  Allergen Reactions  . Atorvastatin     REACTION: myalgia  . Penicillins Hives and Itching    Has patient had a PCN reaction causing immediate rash, facial/tongue/throat swelling, SOB or lightheadedness with hypotension: no Has patient had a PCN reaction causing severe rash involving mucus membranes or skin necrosis: no Has patient had a PCN reaction that required hospitalization : no Has patient had a PCN reaction occurring within the last 10 years: no If all of the  above answers are "NO", then may proceed with Cephalosporin use.     Past Surgical History:  Procedure Laterality Date  . hx of right ankle fracture    . s/p vocal cord polyps  1984   chronic hoarseness  . stress test negative  2003,2004,2006  . TUBAL LIGATION    . tubaligation    . uterine prolapse      Social History   Socioeconomic History  . Marital status: Married    Spouse name: Not on file  . Number of children: 3  . Years of education: Not on file  . Highest education level: Not on file  Occupational History  . 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 Financial resource strain: Not on file  . Food insecurity:    Worry: Not on file    Inability: Not on file  . Transportation needs:    Medical: Not on file    Non-medical: Not on file  Tobacco Use  . Smoking status: Never Smoker  . Smokeless tobacco: Never Used  Substance and Sexual Activity  . Alcohol use: No  . Drug use: No  . Sexual activity: Never  Lifestyle  . Physical activity:    Days per week: Not on file    Minutes per session: Not on file  . Stress: Not on file  Relationships  . Social connections:    Talks on phone: Not on file    Gets together: Not on file    Attends religious service: Not on file    Active member of club or organization: Not on file    Attends meetings of clubs or organizations: Not on file    Relationship status: Not on file  Other Topics Concern  . Not on file  Social History Narrative   Husband with dementia    Family History  Problem Relation Age of Onset  . Stroke Mother   . Heart disease Mother   . Hypertension Mother   . Diabetes Mother   . Multiple myeloma Sister   . Cancer Other        lung     ROS Review of Systems See HPI Constitution: No fevers or chills No malaise No diaphoresis Skin: No rash or itching Eyes: no blurry vision, no double vision GU: no dysuria or hematuria Neuro: no tremors, no slurred speech all others reviewed and negative   Objective: Vitals:   10/23/17 1120 10/23/17 1208  BP: (!) 150/89 108/70  Pulse: 74   Resp: 16   Temp: 98.7 F (37.1 C)   TempSrc: Oral   SpO2: 96%   Weight: 152 lb 3.2 oz (69 kg)   Height: 5' 6.5" (1.689 m)     Physical Exam  Constitutional: She is oriented to person, place, and time. She appears well-developed and well-nourished.  HENT:  Head: Normocephalic and atraumatic.  Eyes: Conjunctivae and EOM are normal.  Cardiovascular: Normal rate, regular rhythm and normal heart sounds.  No murmur heard. Pulmonary/Chest: Effort normal and breath sounds  normal. No stridor. No respiratory distress.  Neurological: She is alert and oriented to person, place, and time. Coordination normal.  Skin: Skin is warm. Capillary refill takes less than 2 seconds.  Psychiatric: She has a normal mood and affect. Her behavior is normal. Judgment and thought content normal.     Assessment and Plan Vanessia was seen today for hypotension.  Diagnoses and all orders for this visit:  Dizziness Hypotension  due to drugs -  Advised pt discontinue home bp meds for low bp Discussed that she should eat more often  Right knee pain -   Discussed arthritis  -  Discussed topical rubs     Brietta Manso A Nolon Rod

## 2017-10-23 NOTE — Patient Instructions (Signed)
     IF you received an x-ray today, you will receive an invoice from [facility]. Please contact [facility] at [phone] with questions or concerns regarding your invoice.   IF you received labwork today, you will receive an invoice from [facility]. Please contact [facility] at [phone] with questions or concerns regarding your invoice.   Our billing staff will not be able to assist you with questions regarding bills from these companies.  You will be contacted with the lab results as soon as they are available. The fastest way to get your results is to activate your My Chart account. Instructions are located on the last page of this paperwork. If you have not heard from us regarding the results in 2 weeks, please contact this office.     

## 2017-10-24 ENCOUNTER — Ambulatory Visit: Payer: PPO | Admitting: Family Medicine

## 2017-10-28 DIAGNOSIS — I10 Essential (primary) hypertension: Secondary | ICD-10-CM | POA: Diagnosis not present

## 2017-10-28 DIAGNOSIS — M792 Neuralgia and neuritis, unspecified: Secondary | ICD-10-CM | POA: Diagnosis not present

## 2017-10-28 DIAGNOSIS — I6523 Occlusion and stenosis of bilateral carotid arteries: Secondary | ICD-10-CM | POA: Diagnosis not present

## 2017-10-28 DIAGNOSIS — E7800 Pure hypercholesterolemia, unspecified: Secondary | ICD-10-CM | POA: Diagnosis not present

## 2017-10-29 ENCOUNTER — Encounter: Payer: Self-pay | Admitting: Physician Assistant

## 2017-11-11 DIAGNOSIS — M25551 Pain in right hip: Secondary | ICD-10-CM | POA: Diagnosis not present

## 2017-11-11 DIAGNOSIS — M25511 Pain in right shoulder: Secondary | ICD-10-CM | POA: Diagnosis not present

## 2017-11-11 DIAGNOSIS — M545 Low back pain: Secondary | ICD-10-CM | POA: Diagnosis not present

## 2017-11-11 DIAGNOSIS — M542 Cervicalgia: Secondary | ICD-10-CM | POA: Diagnosis not present

## 2017-12-04 DIAGNOSIS — M25511 Pain in right shoulder: Secondary | ICD-10-CM | POA: Diagnosis not present

## 2017-12-11 DIAGNOSIS — R413 Other amnesia: Secondary | ICD-10-CM | POA: Diagnosis not present

## 2017-12-11 DIAGNOSIS — R7309 Other abnormal glucose: Secondary | ICD-10-CM | POA: Diagnosis not present

## 2017-12-11 DIAGNOSIS — E559 Vitamin D deficiency, unspecified: Secondary | ICD-10-CM | POA: Diagnosis not present

## 2017-12-11 DIAGNOSIS — E785 Hyperlipidemia, unspecified: Secondary | ICD-10-CM | POA: Diagnosis not present

## 2017-12-11 DIAGNOSIS — F4321 Adjustment disorder with depressed mood: Secondary | ICD-10-CM | POA: Diagnosis not present

## 2017-12-11 DIAGNOSIS — Z1389 Encounter for screening for other disorder: Secondary | ICD-10-CM | POA: Diagnosis not present

## 2017-12-11 LAB — LIPID PANEL
Cholesterol: 177 (ref 0–200)
HDL: 58 (ref 35–70)
LDL CALC: 98
Triglycerides: 105 (ref 40–160)

## 2017-12-20 DIAGNOSIS — M545 Low back pain: Secondary | ICD-10-CM | POA: Diagnosis not present

## 2017-12-20 DIAGNOSIS — M542 Cervicalgia: Secondary | ICD-10-CM | POA: Diagnosis not present

## 2017-12-24 DIAGNOSIS — M545 Low back pain: Secondary | ICD-10-CM | POA: Diagnosis not present

## 2017-12-24 DIAGNOSIS — M542 Cervicalgia: Secondary | ICD-10-CM | POA: Diagnosis not present

## 2017-12-24 DIAGNOSIS — M5416 Radiculopathy, lumbar region: Secondary | ICD-10-CM | POA: Diagnosis not present

## 2018-01-02 DIAGNOSIS — N8189 Other female genital prolapse: Secondary | ICD-10-CM | POA: Diagnosis not present

## 2018-01-02 DIAGNOSIS — K5900 Constipation, unspecified: Secondary | ICD-10-CM | POA: Diagnosis not present

## 2018-01-02 DIAGNOSIS — R350 Frequency of micturition: Secondary | ICD-10-CM | POA: Diagnosis not present

## 2018-01-02 DIAGNOSIS — R0982 Postnasal drip: Secondary | ICD-10-CM | POA: Diagnosis not present

## 2018-03-12 DIAGNOSIS — R350 Frequency of micturition: Secondary | ICD-10-CM | POA: Diagnosis not present

## 2018-03-12 DIAGNOSIS — J309 Allergic rhinitis, unspecified: Secondary | ICD-10-CM | POA: Diagnosis not present

## 2018-03-12 DIAGNOSIS — R5383 Other fatigue: Secondary | ICD-10-CM | POA: Diagnosis not present

## 2018-03-12 LAB — HEMOGLOBIN A1C: Hgb A1c MFr Bld: 5.9 (ref 4.0–6.0)

## 2018-03-12 LAB — BASIC METABOLIC PANEL WITH GFR
BUN: 8 (ref 4–21)
Creatinine: 0.9 (ref 0.5–1.1)
Glucose: 85

## 2018-03-12 LAB — TSH: TSH: 0.72 (ref 0.41–5.90)

## 2018-04-15 ENCOUNTER — Ambulatory Visit (INDEPENDENT_AMBULATORY_CARE_PROVIDER_SITE_OTHER): Payer: PPO | Admitting: Emergency Medicine

## 2018-04-15 ENCOUNTER — Encounter: Payer: Self-pay | Admitting: Emergency Medicine

## 2018-04-15 ENCOUNTER — Other Ambulatory Visit: Payer: Self-pay

## 2018-04-15 VITALS — BP 120/78 | HR 63 | Temp 97.9°F | Resp 16 | Ht 65.5 in | Wt 153.2 lb

## 2018-04-15 DIAGNOSIS — K5900 Constipation, unspecified: Secondary | ICD-10-CM

## 2018-04-15 DIAGNOSIS — J069 Acute upper respiratory infection, unspecified: Secondary | ICD-10-CM

## 2018-04-15 MED ORDER — AZITHROMYCIN 250 MG PO TABS: ORAL_TABLET | ORAL | 0 refills | Status: DC

## 2018-04-15 MED ORDER — POLYETHYLENE GLYCOL 3350 17 GM/SCOOP PO POWD: 17.0000 g | Freq: Every day | ORAL | 1 refills | Status: DC

## 2018-04-15 MED ORDER — SENNOSIDES-DOCUSATE SODIUM 8.6-50 MG PO TABS: 1.0000 | ORAL_TABLET | Freq: Every day | ORAL | 0 refills | Status: AC

## 2018-04-15 NOTE — Patient Instructions (Addendum)
     If you have lab work done today you will be contacted with your lab results within the next 2 weeks.  If you have not heard from us then please contact us. The fastest way to get your results is to register for My Chart.   IF you received an x-ray today, you will receive an invoice from [facility]. Please contact [facility] at [phone] with questions or concerns regarding your invoice.   IF you received labwork today, you will receive an invoice from [facility]. Please contact [facility] at [phone] with questions or concerns regarding your invoice.   Our billing staff will not be able to assist you with questions regarding bills from these companies.  You will be contacted with the lab results as soon as they are available. The fastest way to get your results is to activate your My Chart account. Instructions are located on the last page of this paperwork. If you have not heard from us regarding the results in 2 weeks, please contact this office.     Constipation, Adult Constipation is when a person:  Poops (has a bowel movement) fewer times in a week than normal.  Has a hard time pooping.  Has poop that is dry, hard, or bigger than normal.  Follow these instructions at home: Eating and drinking   Eat foods that have a lot of fiber, such as: ? Fresh fruits and vegetables. ? Whole grains. ? Beans.  Eat less of foods that are high in fat, low in fiber, or overly processed, such as: ? [place] fries. ? Hamburgers. ? Cookies. ? Candy. ? Soda.  Drink enough fluid to keep your pee (urine) clear or pale yellow. General instructions  Exercise regularly or as told by your doctor.  Go to the restroom when you feel like you need to poop. Do not hold it in.  Take over-the-counter and prescription medicines only as told by your doctor. These include any fiber supplements.  Do pelvic floor retraining exercises, such as: ? Doing deep breathing while  relaxing your lower belly (abdomen). ? Relaxing your pelvic floor while pooping.  Watch your condition for any changes.  Keep all follow-up visits as told by your doctor. This is important. Contact a doctor if:  You have pain that gets worse.  You have a fever.  You have not pooped for 4 days.  You throw up (vomit).  You are not hungry.  You lose weight.  You are bleeding from the anus.  You have thin, pencil-like poop (stool). Get help right away if:  You have a fever, and your symptoms suddenly get worse.  You leak poop or have blood in your poop.  Your belly feels hard or bigger than normal (is bloated).  You have very bad belly pain.  You feel dizzy or you faint. This information is not intended to replace advice given to you by your health care provider. Make sure you discuss any questions you have with your health care provider. Document Released: 01/02/2008 Document Revised: 02/03/2016 Document Reviewed: 01/04/2016 Elsevier Interactive Patient Education  2018 [employer]

## 2018-04-15 NOTE — Progress Notes (Signed)
[name] 77 y.o.   Chief Complaint  Patient presents with  . Ear Pain    LEFT and down  neck STARTED 04/14/2018 with chest congestion  . Constipation    x 3 weeks    HISTORY OF PRESENT ILLNESS: This is a 77 y.o. female complaining of 2 things: 1.  Flulike symptoms for 1 week with left ear pain, chest congestion, and bringing up phlegm that is yellow-pinkish. 2.  Constipation for 3 weeks not responding to over-the-counter medications.  Has history of hypertension, and was started on amlodipine about 6 months ago. No other significant symptoms.   HPI   Prior to Admission medications   Medication Sig Start Date End Date Taking? Authorizing Provider  amLODipine (NORVASC) 5 MG tablet Take 1 tablet (5 mg total) by mouth daily. 10/15/17  Yes Jaynee Eagles, PA-C  Calcium Carbonate-Vitamin D (CALCIUM-VITAMIN D) 500-200 MG-UNIT tablet Take 1 tablet by mouth every evening.    Yes [provider]  clopidogrel (PLAVIX) 75 MG tablet Take 1 tablet (75 mg total) by mouth daily. 10/14/17  Yes Garvin Fila, MD  Coenzyme Q10 10 MG capsule Take 200 mg by mouth daily.   Yes [provider]  Multiple Vitamins-Minerals (MULTIVITAMIN WITH MINERALS) tablet Take 1 tablet by mouth daily.   Yes [provider]  naproxen sodium (ALEVE) 220 MG tablet Take 220 mg by mouth.   Yes [provider]  Omega-3 Fatty Acids (FISH OIL) 1000 MG CAPS Take 1,000 mg by mouth daily.    Yes [provider]  pravastatin (PRAVACHOL) 20 MG tablet Take 20 mg by mouth daily.   Yes [provider]  magnesium oxide (MAG-OX) 400 MG tablet Take 1 tablet by mouth daily. 09/15/17   [provider]  rosuvastatin (CRESTOR) 40 MG tablet  10/11/17   [provider]  Turmeric 400 MG CAPS Take by mouth.    [provider]    Allergies  Allergen Reactions  . Atorvastatin     REACTION: myalgia  . Penicillins Hives and Itching    Has patient had a PCN  reaction causing immediate rash, facial/tongue/throat swelling, SOB or lightheadedness with hypotension: no Has patient had a PCN reaction causing severe rash involving mucus membranes or skin necrosis: no Has patient had a PCN reaction that required hospitalization : no Has patient had a PCN reaction occurring within the last 10 years: no If all of the above answers are "NO", then may proceed with Cephalosporin use.     Patient Active Problem List   Diagnosis Date Noted  . Dizziness 08/14/2017  . Acute left hemiparesis ([place])   . TIA (transient ischemic attack) 06/01/2016  . CVA (cerebral vascular accident) ([place]) 06/01/2016  . Transient cerebral ischemia   . Complicated migraine 71/24/5809  . Multinodular goiter 08/09/2011  . Lumbar pain with radiation down right leg 04/11/2011  . Rash 04/11/2011  . Impaired glucose tolerance 01/14/2011  . Preventative health care 01/14/2011  . WRIST PAIN, BILATERAL 05/25/2010  . TIA 11/22/2009  . LOW BACK PAIN, CHRONIC 11/22/2009  . ANXIETY 10/03/2009  . DEPRESSION 10/03/2009  . PERIPHERAL NEUROPATHY 10/03/2009  . BARRETTS ESOPHAGUS 10/03/2009  . CONSTIPATION, CHRONIC 10/03/2009  . URETHRAL STRICTURE 10/03/2009  . POSITIVE PPD 10/03/2009  . VOCAL CORD POLYP, HX OF 10/03/2009  . Carotid artery disease ([place]) 08/31/2009  . Hyperlipemia 08/29/2009  . PERIPHERAL VASCULAR DISEASE 08/29/2009  . GERD 08/29/2009  . [place] DISEASE, CERVICAL 08/29/2009  . [place] DISEASE, LUMBAR  08/29/2009  . LEG PAIN, BILATERAL 08/29/2009  . TRANSIENT ISCHEMIC ATTACK, HX OF 08/29/2009  . COLONIC POLYPS, HX OF 08/29/2009    Past Medical History:  Diagnosis Date  . Acute sinus infection 01/15/2011  . ANXIETY 10/03/2009  . BARRETTS ESOPHAGUS 10/03/2009  . CAROTID ARTERY DISEASE 08/31/2009  . COLONIC POLYPS, HX OF 08/29/2009  . CONSTIPATION, CHRONIC 10/03/2009  . DEPRESSION 10/03/2009  . [place] DISEASE, CERVICAL 08/29/2009  . [place] DISEASE, LUMBAR 08/29/2009  . Eustachian tube  dysfunction 01/15/2011  . GERD 08/29/2009  . HYPERLIPIDEMIA 08/29/2009  . Impaired glucose tolerance 01/14/2011  . LEG PAIN, BILATERAL 08/29/2009  . LOW BACK PAIN, CHRONIC 11/22/2009  . PERIPHERAL NEUROPATHY 10/03/2009  . PERIPHERAL VASCULAR DISEASE 08/29/2009  . POSITIVE PPD 10/03/2009  . Preventative health care 01/14/2011  . Stroke ([place])   . Thyroid nodule 08/09/2011  . TIA 11/22/2009  . TRANSIENT ISCHEMIC ATTACK, HX OF 08/29/2009  . URETHRAL STRICTURE 10/03/2009  . VOCAL CORD POLYP, HX OF 10/03/2009  . WRIST PAIN, BILATERAL 05/25/2010    Past Surgical History:  Procedure Laterality Date  . hx of right ankle fracture    . s/p vocal cord polyps  1984   chronic hoarseness  . stress test negative  2003,2004,2006  . TUBAL LIGATION    . tubaligation    . uterine prolapse      Social History   Socioeconomic History  . Marital status: Married    Spouse name: Not on file  . Number of children: 3  . Years of education: Not on file  . Highest education level: Not on file  Occupational History  . 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 Financial resource strain: Not on file  . Food insecurity:    Worry: Not on file    Inability: Not on file  . Transportation needs:    Medical: Not on file    Non-medical: Not on file  Tobacco Use  . Smoking status: Never Smoker  . Smokeless tobacco: Never Used  Substance and Sexual Activity  . Alcohol use: No  . Drug use: No  . Sexual activity: Never  Lifestyle  . Physical activity:    Days per week: Not on file    Minutes per session: Not on file  . Stress: Not on file  Relationships  . Social connections:    Talks on phone: Not on file    Gets together: Not on file    Attends religious service: Not on file    Active member of club or organization: Not on file    Attends meetings of clubs or organizations: Not on file    Relationship status: Not on file  . Intimate partner violence:    Fear of  current or ex partner: Not on file    Emotionally abused: Not on file    Physically abused: Not on file    Forced sexual activity: Not on file  Other Topics Concern  . Not on file  Social History Narrative   Husband with dementia    Family History  Problem Relation Age of Onset  . Stroke Mother   . Heart disease Mother   . Hypertension Mother   . Diabetes Mother   . Multiple myeloma Sister   . Cancer Other        lung     Review of Systems  Constitutional: Negative.  Negative for chills and fever.  HENT: Positive for congestion and  sore throat.   Eyes: Negative.  Negative for blurred vision and double vision.  Respiratory: Negative.  Negative for cough and shortness of breath.   Cardiovascular: Negative.  Negative for chest pain and palpitations.  Gastrointestinal: Positive for constipation. Negative for abdominal pain, diarrhea, nausea and vomiting.  Genitourinary: Negative.  Negative for dysuria and hematuria.  Musculoskeletal: Negative for back pain, myalgias and neck pain.  Skin: Negative.  Negative for rash.  Neurological: Negative.  Negative for dizziness and headaches.  Endo/Heme/Allergies: Negative.   All other systems reviewed and are negative.   Vitals:   04/15/18 1405  BP: (!) 171/76  Pulse: 63  Resp: 16  Temp: 97.9 F (36.6 C)  SpO2: 100%    Physical Exam  Constitutional: She is oriented to person, place, and time. She appears well-developed and well-nourished.  HENT:  Head: Normocephalic and atraumatic.  Right Ear: Tympanic membrane, external ear and ear canal normal.  Left Ear: Tympanic membrane, external ear and ear canal normal.  Nose: Nose normal.  Mouth/Throat: Oropharynx is clear and moist.  Eyes: Pupils are equal, round, and reactive to light. Conjunctivae and EOM are normal.  Neck: Normal range of motion. Neck supple. No thyromegaly present.  Cardiovascular: Normal rate, regular rhythm and normal heart sounds.  Pulmonary/Chest: Effort  normal and breath sounds normal.  Abdominal: Soft. Bowel sounds are normal. She exhibits no distension. There is no tenderness.  Musculoskeletal: Normal range of motion. She exhibits no edema.  Lymphadenopathy:    She has no cervical adenopathy.  Neurological: She is alert and oriented to person, place, and time. No sensory deficit. She exhibits normal muscle tone.  Skin: Skin is warm and dry. Capillary refill takes less than 2 seconds.  Psychiatric: She has a normal mood and affect. Her behavior is normal.  Vitals reviewed.    ASSESSMENT & PLAN: [name] was seen today for ear pain and constipation.  Diagnoses and all orders for this visit:  Constipation, unspecified constipation type -     polyethylene glycol powder (GLYCOLAX/MIRALAX) powder; Take 17 g by mouth daily. -     senna-docusate (SENOKOT-S) 8.6-50 MG tablet; Take 1 tablet by mouth daily for 7 days.  Upper respiratory tract infection, unspecified type -     azithromycin (ZITHROMAX) 250 MG tablet; Sig as indicated    Patient Instructions       If you have lab work done today you will be contacted with your lab results within the next 2 weeks.  If you have not heard from [place] then please contact us. The fastest way to get your results is to register for My Chart.   IF you received an x-ray today, you will receive an invoice from [facility]. Please contact [facility] at [phone] with questions or concerns regarding your invoice.   IF you received labwork today, you will receive an invoice from [facility]. Please contact [facility] at [phone] with questions or concerns regarding your invoice.   Our billing staff will not be able to assist you with questions regarding bills from these companies.  You will be contacted with the lab results as soon as they are available. The fastest way to get your results is to activate your My Chart account. Instructions are located on the last page of this paperwork.  If you have not heard from [place] regarding the results in 2 weeks, please contact this office.     Constipation, Adult Constipation is when a person:  Poops (has a bowel movement) fewer  times in a week than normal.  Has a hard time pooping.  Has poop that is dry, hard, or bigger than normal.  Follow these instructions at home: Eating and drinking   Eat foods that have a lot of fiber, such as: ? Fresh fruits and vegetables. ? Whole grains. ? Beans.  Eat less of foods that are high in fat, low in fiber, or overly processed, such as: ? [place] fries. ? Hamburgers. ? Cookies. ? Candy. ? Soda.  Drink enough fluid to keep your pee (urine) clear or pale yellow. General instructions  Exercise regularly or as told by your doctor.  Go to the restroom when you feel like you need to poop. Do not hold it in.  Take over-the-counter and prescription medicines only as told by your doctor. These include any fiber supplements.  Do pelvic floor retraining exercises, such as: ? Doing deep breathing while relaxing your lower belly (abdomen). ? Relaxing your pelvic floor while pooping.  Watch your condition for any changes.  Keep all follow-up visits as told by your doctor. This is important. Contact a doctor if:  You have pain that gets worse.  You have a fever.  You have not pooped for 4 days.  You throw up (vomit).  You are not hungry.  You lose weight.  You are bleeding from the anus.  You have thin, pencil-like poop (stool). Get help right away if:  You have a fever, and your symptoms suddenly get worse.  You leak poop or have blood in your poop.  Your belly feels hard or bigger than normal (is bloated).  You have very bad belly pain.  You feel dizzy or you faint. This information is not intended to replace advice given to you by your health care provider. Make sure you discuss any questions you have with your health care provider. Document Released: 01/02/2008  Document Revised: 02/03/2016 Document Reviewed: 01/04/2016 Elsevier Interactive Patient Education  2018 [employer] Inc.      Agustina Caroli, MD [facility]

## 2018-05-01 ENCOUNTER — Other Ambulatory Visit: Payer: Self-pay | Admitting: Internal Medicine

## 2018-05-02 DIAGNOSIS — I6523 Occlusion and stenosis of bilateral carotid arteries: Secondary | ICD-10-CM | POA: Diagnosis not present

## 2018-05-05 DIAGNOSIS — I6523 Occlusion and stenosis of bilateral carotid arteries: Secondary | ICD-10-CM | POA: Diagnosis not present

## 2018-05-05 DIAGNOSIS — E7800 Pure hypercholesterolemia, unspecified: Secondary | ICD-10-CM | POA: Diagnosis not present

## 2018-05-05 DIAGNOSIS — Z0189 Encounter for other specified special examinations: Secondary | ICD-10-CM | POA: Diagnosis not present

## 2018-05-05 DIAGNOSIS — I10 Essential (primary) hypertension: Secondary | ICD-10-CM | POA: Diagnosis not present

## 2018-05-08 ENCOUNTER — Other Ambulatory Visit: Payer: Self-pay | Admitting: Urgent Care

## 2018-05-15 DIAGNOSIS — J0100 Acute maxillary sinusitis, unspecified: Secondary | ICD-10-CM | POA: Diagnosis not present

## 2018-05-15 DIAGNOSIS — R0981 Nasal congestion: Secondary | ICD-10-CM | POA: Diagnosis not present

## 2018-05-19 DIAGNOSIS — H11422 Conjunctival edema, left eye: Secondary | ICD-10-CM | POA: Diagnosis not present

## 2018-06-16 ENCOUNTER — Encounter: Payer: Self-pay | Admitting: Family Medicine

## 2018-06-16 ENCOUNTER — Encounter: Payer: Self-pay | Admitting: Internal Medicine

## 2018-06-16 ENCOUNTER — Encounter (INDEPENDENT_AMBULATORY_CARE_PROVIDER_SITE_OTHER): Payer: Self-pay

## 2018-06-16 ENCOUNTER — Ambulatory Visit (INDEPENDENT_AMBULATORY_CARE_PROVIDER_SITE_OTHER): Payer: PPO | Admitting: Internal Medicine

## 2018-06-16 VITALS — BP 120/74 | HR 70 | Temp 98.0°F | Ht 65.5 in | Wt 154.4 lb

## 2018-06-16 DIAGNOSIS — F514 Sleep terrors [night terrors]: Secondary | ICD-10-CM

## 2018-06-16 DIAGNOSIS — I10 Essential (primary) hypertension: Secondary | ICD-10-CM | POA: Diagnosis not present

## 2018-06-16 DIAGNOSIS — E7800 Pure hypercholesterolemia, unspecified: Secondary | ICD-10-CM | POA: Diagnosis not present

## 2018-06-16 DIAGNOSIS — R0982 Postnasal drip: Secondary | ICD-10-CM | POA: Diagnosis not present

## 2018-06-17 LAB — CMP14+EGFR
ALBUMIN: 4.6 g/dL (ref 3.5–4.8)
ALT: 33 IU/L — ABNORMAL HIGH (ref 0–32)
AST: 38 IU/L (ref 0–40)
Albumin/Globulin Ratio: 2.1 (ref 1.2–2.2)
Alkaline Phosphatase: 75 IU/L (ref 39–117)
BILIRUBIN TOTAL: 0.5 mg/dL (ref 0.0–1.2)
BUN / CREAT RATIO: 8 — AB (ref 12–28)
BUN: 7 mg/dL — AB (ref 8–27)
CO2: 24 mmol/L (ref 20–29)
CREATININE: 0.89 mg/dL (ref 0.57–1.00)
Calcium: 9.8 mg/dL (ref 8.7–10.3)
Chloride: 103 mmol/L (ref 96–106)
GFR, EST AFRICAN AMERICAN: 72 mL/min/{1.73_m2} (ref >59)
GFR, EST NON AFRICAN AMERICAN: 62 mL/min/{1.73_m2} (ref >59)
GLUCOSE: 87 mg/dL (ref 65–99)
Globulin, Total: 2.2 g/dL (ref 1.5–4.5)
Potassium: 4.1 mmol/L (ref 3.5–5.2)
Sodium: 144 mmol/L (ref 134–144)
TOTAL PROTEIN: 6.8 g/dL (ref 6.0–8.5)

## 2018-06-17 LAB — LIPID PANEL
CHOL/HDL RATIO: 3.1 ratio (ref 0.0–4.4)
Cholesterol, Total: 164 mg/dL (ref 100–199)
HDL: 53 mg/dL (ref >39)
LDL Calculated: 90 mg/dL (ref 0–99)
TRIGLYCERIDES: 107 mg/dL (ref 0–149)
VLDL CHOLESTEROL CAL: 21 mg/dL (ref 5–40)

## 2018-06-17 NOTE — Progress Notes (Signed)
Your chol looks great. Your kidney and liver fxn are stable.

## 2018-06-29 ENCOUNTER — Encounter: Payer: Self-pay | Admitting: Internal Medicine

## 2018-06-29 NOTE — Progress Notes (Signed)
Subjective:     Patient ID: [name] , female    DOB: [birth year] , 77 y.o.   MRN: [id number]   Chief Complaint  Patient presents with  . Hyperlipidemia  . nightmares    HPI  Hyperlipidemia  This is a chronic problem. The problem is controlled. Recent lipid tests were reviewed and are normal. Pertinent negatives include no chest pain, focal sensory loss, leg pain or shortness of breath. Current antihyperlipidemic treatment includes statins. The current treatment provides moderate improvement of lipids. Compliance problems include adherence to exercise.     NIGHTMARES SHE ALSO C/O NIGHTMARES. THIS IS A CHRONIC CONDITION.SHE HAS BEEN EVALUATED BY [facility] IN THE PAST.   Past Medical History:  Diagnosis Date  . Acute sinus infection 01/15/2011  . ANXIETY 10/03/2009  . BARRETTS ESOPHAGUS 10/03/2009  . CAROTID ARTERY DISEASE 08/31/2009  . COLONIC POLYPS, HX OF 08/29/2009  . CONSTIPATION, CHRONIC 10/03/2009  . DEPRESSION 10/03/2009  . DISC DISEASE, CERVICAL 08/29/2009  . DISC DISEASE, LUMBAR 08/29/2009  . Eustachian tube dysfunction 01/15/2011  . GERD 08/29/2009  . HYPERLIPIDEMIA 08/29/2009  . Impaired glucose tolerance 01/14/2011  . LEG PAIN, BILATERAL 08/29/2009  . LOW BACK PAIN, CHRONIC 11/22/2009  . PERIPHERAL NEUROPATHY 10/03/2009  . PERIPHERAL VASCULAR DISEASE 08/29/2009  . POSITIVE PPD 10/03/2009  . Preventative health care 01/14/2011  . Stroke (HCC)   . Thyroid nodule 08/09/2011  . TIA 11/22/2009  . TRANSIENT ISCHEMIC ATTACK, HX OF 08/29/2009  . URETHRAL STRICTURE 10/03/2009  . VOCAL CORD POLYP, HX OF 10/03/2009  . WRIST PAIN, BILATERAL 05/25/2010     Family History  Problem Relation Age of Onset  . Stroke Mother   . Heart disease Mother   . Hypertension Mother   . Diabetes Mother   . Multiple myeloma Sister   . Cancer Other        lung     Current Outpatient Medications:  .  amLODipine (NORVASC) 5 MG tablet, TAKE 1 TABLET BY MOUTH EVERY DAY, Disp: 30 tablet, Rfl: 0 .  clopidogrel  (PLAVIX) 75 MG tablet, Take 1 tablet (75 mg total) by mouth daily., Disp: 90 tablet, Rfl: 1 .  Coenzyme Q10 10 MG capsule, Take 200 mg by mouth daily., Disp: , Rfl:  .  loratadine (CLARITIN) 10 MG tablet, TAKE 1 TABLET BY MOUTH EVERY DAY, Disp: 30 tablet, Rfl: 1 .  Multiple Vitamins-Minerals (MULTIVITAMIN WITH MINERALS) tablet, Take 1 tablet by mouth daily., Disp: , Rfl:  .  naproxen sodium (ALEVE) 220 MG tablet, Take 220 mg by mouth., Disp: , Rfl:  .  Omega-3 Fatty Acids (FISH OIL) 1000 MG CAPS, Take 1,000 mg by mouth daily. , Disp: , Rfl:  .  polyethylene glycol powder (GLYCOLAX/MIRALAX) powder, Take 17 g by mouth daily., Disp: 3350 g, Rfl: 1 .  rosuvastatin (CRESTOR) 40 MG tablet, , Disp: , Rfl:    Allergies  Allergen Reactions  . Atorvastatin     REACTION: myalgia  . Penicillins Hives and Itching    Has patient had a PCN reaction causing immediate rash, facial/tongue/throat swelling, SOB or lightheadedness with hypotension: no Has patient had a PCN reaction causing severe rash involving mucus membranes or skin necrosis: no Has patient had a PCN reaction that required hospitalization : no Has patient had a PCN reaction occurring within the last 10 years: no If all of the above answers are "NO", then may proceed with Cephalosporin use.      Review of Systems  Constitutional: Negative.  Respiratory: Negative.  Negative for shortness of breath.   Cardiovascular: Negative.  Negative for chest pain.  Gastrointestinal: Negative.   Neurological: Negative.   Psychiatric/Behavioral: Negative.      Today's Vitals   06/16/18 1143  BP: 120/74  Pulse: 70  Temp: 98 F (36.7 C)  TempSrc: Oral  Weight: 154 lb 6.4 oz (70 kg)  Height: 5' 5.5" (1.664 m)  PainSc: 0-No pain   Body mass index is 25.3 kg/m.   Objective:  Physical Exam  Constitutional: She is oriented to person, place, and time. She appears well-developed and well-nourished.  HENT:  Head: Normocephalic and atraumatic.   Eyes: EOM are normal.  Cardiovascular: Normal rate, regular rhythm and normal heart sounds.  Pulmonary/Chest: Effort normal and breath sounds normal.  Neurological: She is alert and oriented to person, place, and time.  Psychiatric: She has a normal mood and affect.  Nursing note and vitals reviewed.       Assessment And Plan:     1. Pure hypercholesterolemia  I WILL CHECK LABS AS LISTED BELOW. SHE IS ENCOURAGED TO AVOID FRIED FOODS, TAKE MEDS AS DIRECTED, AND INCREASE HER DAILY EXERCISE. SHE IS ENCOURAGED TO AIM FOR 30 MINUTES FOUR TO FIVE DAYS WEEKLY.   - Lipid Profile  2. Night terrors  CHRONIC, AS PER NEURO. IF PERSISTENT, SHE MAY BENEFIT FROM CLONAZEPAM AT NIGHT.    3. Postnasal drip  SHE IS ADVISED TO TAKE LORATADINE OR ALLEGRA OTC.   4. Essential hypertension, benign  WELL CONTROLLED. HER CHART WAS REVIEWED IN FULL DURING THIS VISIT. HTN IS A DIAGNOSIS SHE HAS ALWAYS DENIED IN THE PAST. HOWEVER, SINCE OUR PRACTICE IS NEW TO [facility], I CAN SEE THAT SHE HAS BEEN SEEN AT [facility] ON SEVERAL OCCASIONS FOR BP EVALUATION. PT ADVISED THAT IT IS DANGEROUS TO NOT REVEAL TO ALL PROVIDERS ALL MEDICATIONS THAT SHE IS TAKING. SHE IS ADVISED THAT [facility] IS ALSO LISTED AS HER PCP.  - [id number]        Gwynneth Aliment, MD

## 2018-06-30 ENCOUNTER — Other Ambulatory Visit: Payer: Self-pay | Admitting: Internal Medicine

## 2018-08-04 ENCOUNTER — Other Ambulatory Visit: Payer: Self-pay

## 2018-08-04 ENCOUNTER — Ambulatory Visit (INDEPENDENT_AMBULATORY_CARE_PROVIDER_SITE_OTHER): Payer: PPO | Admitting: Family Medicine

## 2018-08-04 ENCOUNTER — Encounter: Payer: Self-pay | Admitting: Family Medicine

## 2018-08-04 VITALS — BP 150/92 | HR 76 | Temp 98.3°F | Resp 18 | Ht 65.04 in | Wt 157.4 lb

## 2018-08-04 DIAGNOSIS — M79604 Pain in right leg: Secondary | ICD-10-CM

## 2018-08-04 DIAGNOSIS — R030 Elevated blood-pressure reading, without diagnosis of hypertension: Secondary | ICD-10-CM

## 2018-08-04 DIAGNOSIS — I8393 Asymptomatic varicose veins of bilateral lower extremities: Secondary | ICD-10-CM | POA: Diagnosis not present

## 2018-08-04 DIAGNOSIS — K219 Gastro-esophageal reflux disease without esophagitis: Secondary | ICD-10-CM

## 2018-08-04 NOTE — Patient Instructions (Addendum)
Take ibuprofen 200 mg 2 pills 3 times daily if needed for leg pain.  Do not wait until the pain is severe, but take a couple when it starts hurting.  Take with food.  Take Zantac (ranitidine) 150 mg 1 hour before bedtime to see if that will help reduce the reflux.  This is available over-the-counter.  If you are having more problems with pain get reexamined.  Continue to take your blood pressure medication and monitor the blood pressure.  The goal is to have the blood pressure below 140/90, and ideal is down closer to 120/70.  Return to see Dr. Creta LevinStallings if further problems, or go to the emergency room if acutely ill.   Sciatica  Sciatica is pain, numbness, weakness, or tingling along the path of the sciatic nerve. The sciatic nerve starts in the lower back and runs down the back of each leg. The nerve controls the muscles in the lower leg and in the back of the knee. It also provides feeling (sensation) to the back of the thigh, the lower leg, and the sole of the foot. Sciatica is a symptom of another medical condition that pinches or puts pressure on the sciatic nerve. Generally, sciatica only affects one side of the body. Sciatica usually goes away on its own or with treatment. In some cases, sciatica may keep coming back (recur). What are the causes? This condition is caused by pressure on the sciatic nerve, or pinching of the sciatic nerve. This may be the result of:  A disk in between the bones of the spine (vertebrae) bulging out too far (herniated disk).  Age-related changes in the spinal disks (degenerative disk disease).  A pain disorder that affects a muscle in the buttock (piriformis syndrome).  Extra bone growth (bone spur) near the sciatic nerve.  An injury or break (fracture) of the pelvis.  Pregnancy.  Tumor (rare). What increases the risk? The following factors may make you more likely to develop this condition:  Playing sports that place pressure or stress on the  spine, such as football or weight lifting.  Having poor strength and flexibility.  A history of back injury.  A history of back surgery.  Sitting for long periods of time.  Doing activities that involve repetitive bending or lifting.  Obesity. What are the signs or symptoms? Symptoms can vary from mild to very severe, and they may include:  Any of these problems in the lower back, leg, hip, or buttock: ? Mild tingling or dull aches. ? Burning sensations. ? Sharp pains.  Numbness in the back of the calf or the sole of the foot.  Leg weakness.  Severe back pain that makes movement difficult. These symptoms may get worse when you cough, sneeze, or laugh, or when you sit or stand for long periods of time. Being overweight may also make symptoms worse. In some cases, symptoms may recur over time. How is this diagnosed? This condition may be diagnosed based on:  Your symptoms.  A physical exam. Your health care provider may ask you to do certain movements to check whether those movements trigger your symptoms.  You may have tests, including: ? Blood tests. ? X-rays. ? MRI. ? CT scan. How is this treated? In many cases, this condition improves on its own, without any treatment. However, treatment may include:  Reducing or modifying physical activity during periods of pain.  Exercising and stretching to strengthen your abdomen and improve the flexibility of your spine.  Icing and  applying heat to the affected area.  Medicines that help: ? To relieve pain and swelling. ? To relax your muscles.  Injections of medicines that help to relieve pain, irritation, and inflammation around the sciatic nerve (steroids).  Surgery. Follow these instructions at home: Medicines  Take over-the-counter and prescription medicines only as told by your health care provider.  Do not drive or operate heavy machinery while taking prescription pain medicine. Managing pain  If directed,  apply ice to the affected area. ? Put ice in a plastic bag. ? Place a towel between your skin and the bag. ? Leave the ice on for 20 minutes, 2-3 times a day.  After icing, apply heat to the affected area before you exercise or as often as told by your health care provider. Use the heat source that your health care provider recommends, such as a moist heat pack or a heating pad. ? Place a towel between your skin and the heat source. ? Leave the heat on for 20-30 minutes. ? Remove the heat if your skin turns bright red. This is especially important if you are unable to feel pain, heat, or cold. You may have a greater risk of getting burned. Activity  Return to your normal activities as told by your health care provider. Ask your health care provider what activities are safe for you. ? Avoid activities that make your symptoms worse.  Take brief periods of rest throughout the day. Resting in a lying or standing position is usually better than sitting to rest. ? When you rest for longer periods, mix in some mild activity or stretching between periods of rest. This will help to prevent stiffness and pain. ? Avoid sitting for long periods of time without moving. Get up and move around at least one time each hour.  Exercise and stretch regularly, as told by your health care provider.  Do not lift anything that is heavier than 10 lb (4.5 kg) while you have symptoms of sciatica. When you do not have symptoms, you should still avoid heavy lifting, especially repetitive heavy lifting.  When you lift objects, always use proper lifting technique, which includes: ? Bending your knees. ? Keeping the load close to your body. ? Avoiding twisting. General instructions  Use good posture. ? Avoid leaning forward while sitting. ? Avoid hunching over while standing.  Maintain a healthy weight. Excess weight puts extra stress on your back and makes it difficult to maintain good posture.  Wear supportive,  comfortable shoes. Avoid wearing high heels.  Avoid sleeping on a mattress that is too soft or too hard. A mattress that is firm enough to support your back when you sleep may help to reduce your pain.  Keep all follow-up visits as told by your health care provider. This is important. Contact a health care provider if:  You have pain that wakes you up when you are sleeping.  You have pain that gets worse when you lie down.  Your pain is worse than you have experienced in the past.  Your pain lasts longer than 4 weeks.  You experience unexplained weight loss. Get help right away if:  You lose control of your bowel or bladder (incontinence).  You have: ? Weakness in your lower back, pelvis, buttocks, or legs that gets worse. ? Redness or swelling of your back. ? A burning sensation when you urinate. This information is not intended to replace advice given to you by your health care provider. Make sure you  discuss any questions you have with your health care provider. Document Released: 07/10/2001 Document Revised: 12/20/2015 Document Reviewed: 03/25/2015 Elsevier Interactive Patient Education  2019 [employer]  Gastroesophageal Reflux Disease, Adult Gastroesophageal reflux (GER) happens when acid from the stomach flows up into the tube that connects the mouth and the stomach (esophagus). Normally, food travels down the esophagus and stays in the stomach to be digested. With GER, food and stomach acid sometimes move back up into the esophagus. You may have a disease called gastroesophageal reflux disease (GERD) if the reflux:  Happens often.  Causes frequent or very bad symptoms.  Causes problems such as damage to the esophagus. When this happens, the esophagus becomes sore and swollen (inflamed). Over time, GERD can make small holes (ulcers) in the lining of the esophagus. What are the causes? This condition is caused by a problem with the muscle between the esophagus and the  stomach. When this muscle is weak or not normal, it does not close properly to keep food and acid from coming back up from the stomach. The muscle can be weak because of:  Tobacco use.  Pregnancy.  Having a certain type of hernia (hiatal hernia).  Alcohol use.  Certain foods and drinks, such as coffee, chocolate, onions, and peppermint. What increases the risk? You are more likely to develop this condition if you:  Are overweight.  Have a disease that affects your connective tissue.  Use NSAID medicines. What are the signs or symptoms? Symptoms of this condition include:  Heartburn.  Difficult or painful swallowing.  The feeling of having a lump in the throat.  A bitter taste in the mouth.  Bad breath.  Having a lot of saliva.  Having an upset or bloated stomach.  Belching.  Chest pain. Different conditions can cause chest pain. Make sure you see your doctor if you have chest pain.  Shortness of breath or noisy breathing (wheezing).  Ongoing (chronic) cough or a cough at night.  Wearing away of the surface of teeth (tooth enamel).  Weight loss. How is this treated? Treatment will depend on how bad your symptoms are. Your doctor may suggest:  Changes to your diet.  Medicine.  Surgery. Follow these instructions at home: Eating and drinking   Follow a diet as told by your doctor. You may need to avoid foods and drinks such as: ? Coffee and tea (with or without caffeine). ? Drinks that contain alcohol. ? Energy drinks and sports drinks. ? Bubbly (carbonated) drinks or sodas. ? Chocolate and cocoa. ? Peppermint and mint flavorings. ? Garlic and onions. ? Horseradish. ? Spicy and acidic foods. These include peppers, chili powder, curry powder, vinegar, hot sauces, and BBQ sauce. ? Citrus fruit juices and citrus fruits, such as oranges, lemons, and limes. ? Tomato-based foods. These include red sauce, chili, salsa, and pizza with red sauce. ? Fried and  fatty foods. These include donuts, french fries, potato chips, and high-fat dressings. ? High-fat meats. These include hot dogs, rib eye steak, sausage, ham, and bacon. ? High-fat dairy items, such as whole milk, butter, and cream cheese.  Eat small meals often. Avoid eating large meals.  Avoid drinking large amounts of liquid with your meals.  Avoid eating meals during the 2-3 hours before bedtime.  Avoid lying down right after you eat.  Do not exercise right after you eat. Lifestyle   Do not use any products that contain nicotine or tobacco. These include cigarettes, e-cigarettes, and chewing tobacco. If you  need help quitting, ask your doctor.  Try to lower your stress. If you need help doing this, ask your doctor.  If you are overweight, lose an amount of weight that is healthy for you. Ask your doctor about a safe weight loss goal. General instructions  Pay attention to any changes in your symptoms.  Take over-the-counter and prescription medicines only as told by your doctor. Do not take aspirin, ibuprofen, or other NSAIDs unless your doctor says it is okay.  Wear loose clothes. Do not wear anything tight around your waist.  Raise (elevate) the head of your bed about 6 inches (15 cm).  Avoid bending over if this makes your symptoms worse.  Keep all follow-up visits as told by your doctor. This is important. Contact a doctor if:  You have new symptoms.  You lose weight and you do not know why.  You have trouble swallowing or it hurts to swallow.  You have wheezing or a cough that keeps happening.  Your symptoms do not get better with treatment.  You have a hoarse voice. Get help right away if:  You have pain in your arms, neck, jaw, teeth, or back.  You feel sweaty, dizzy, or light-headed.  You have chest pain or shortness of breath.  You throw up (vomit) and your throw-up looks like blood or coffee grounds.  You pass out (faint).  Your poop (stool) is  bloody or black.  You cannot swallow, drink, or eat. Summary  If a person has gastroesophageal reflux disease (GERD), food and stomach acid move back up into the esophagus and cause symptoms or problems such as damage to the esophagus.  Treatment will depend on how bad your symptoms are.  Follow a diet as told by your doctor.  Take all medicines only as told by your doctor. This information is not intended to replace advice given to you by your health care provider. Make sure you discuss any questions you have with your health care provider. Document Released: 01/02/2008 Document Revised: 01/22/2018 Document Reviewed: 01/22/2018 Elsevier Interactive Patient Education  [employer].   If you have lab work done today you will be contacted with your lab results within the next 2 weeks.  If you have not heard from [place] then please contact us. The fastest way to get your results is to register for My Chart.   IF you received an x-ray today, you will receive an invoice from [facility]. Please contact [facility] at [phone] with questions or concerns regarding your invoice.   IF you received labwork today, you will receive an invoice from [facility]. Please contact [facility] at [phone] with questions or concerns regarding your invoice.   Our billing staff will not be able to assist you with questions regarding bills from these companies.  You will be contacted with the lab results as soon as they are available. The fastest way to get your results is to activate your My Chart account. Instructions are located on the last page of this paperwork. If you have not heard from [place] regarding the results in 2 weeks, please contact this office.

## 2018-08-04 NOTE — Progress Notes (Signed)
Patient ID: [name], female    DOB: [birth year]  Age: 78 y.o. MRN: [id number]  Chief Complaint  Patient presents with  . Leg Pain    X 3 days- right leg- pt states blue and black on right foot    Subjective:   88 who comes in here with a history of 3 days ago when she was laying down she had an acute pain in her right leg.  It hurt from the medial part of the foot and ankle arm up to the right groin region.  She was concerned about what was going on, so she unlocked the doors in case she had to call 911.  She said the pain finally subsided and she went on back to rest.  However since then she has had a come and go a couple of times.  She is not hurting right now.  She had not had any injuries or falls.  She did have a number of falls back when she was caring for her husband, but that is been some time now.  She tries to stay pretty active.  She does have some problems with chronic constipation.  She also asked me about a cough that develops when she lays down at night.  She feels like it comes up from her stomach.  She does not have a history of high blood pressure, but when she was having this pain the other night she got a elevated blood pressure at 178/75.  It subsequently has come down.  She had a TIA a couple of years ago and apparently it was at that time that she was placed on the amlodipine along with the generic Plavix.  Current allergies, medications, problem list, past/family and social histories reviewed.  Objective:  BP (!) 150/92   Pulse 76   Temp 98.3 F (36.8 C) (Oral)   Resp 18   Ht 5' 5.04" (1.652 m)   Wt 157 lb 6.4 oz (71.4 kg)   SpO2 99%   BMI 26.16 kg/m   Pleasant, alert and oriented, no acute distress.  Right leg raising is negative.  No pain on rotational movement of the hip joint.  Her groin area is nontender.  She has some visible varicose veins on the leg and some more superficial veins on her foot.  Slight bluish discoloration of the right foot  compared to the left.  No calf tenderness negative Homans.  Her left leg had a almost nonpalpable pulse in the foot, and right was 1+.  Capillary refill and color good.  Gait is good.  Assessment & Plan:   Assessment: 1. Right leg pain   2. Asymptomatic varicose veins of both lower extremities   3. Elevated blood pressure reading   4. Gastroesophageal reflux disease without esophagitis       Plan: The pain is a little atypical.  It acts like a sciatic pain in its radiation.  However straight leg raising and exam is good today.  No evidence for DVT.  The varicosities are not particularly tender.  No orders of the defined types were placed in this encounter.   No orders of the defined types were placed in this encounter.        Patient Instructions    Take ibuprofen 200 mg 2 pills 3 times daily if needed for leg pain.  Do not wait until the pain is severe, but take a couple when it starts hurting.  Take with food.  Take Zantac (ranitidine) 150  mg 1 hour before bedtime to see if that will help reduce the reflux.  This is available over-the-counter.  If you are having more problems with pain get reexamined.  Continue to take your blood pressure medication and monitor the blood pressure.  The goal is to have the blood pressure below 140/90, and ideal is down closer to 120/70.  Return to see Dr. Creta Levin if further problems, or go to the emergency room if acutely ill.   Sciatica  Sciatica is pain, numbness, weakness, or tingling along the path of the sciatic nerve. The sciatic nerve starts in the lower back and runs down the back of each leg. The nerve controls the muscles in the lower leg and in the back of the knee. It also provides feeling (sensation) to the back of the thigh, the lower leg, and the sole of the foot. Sciatica is a symptom of another medical condition that pinches or puts pressure on the sciatic nerve. Generally, sciatica only affects one side of the body.  Sciatica usually goes away on its own or with treatment. In some cases, sciatica may keep coming back (recur). What are the causes? This condition is caused by pressure on the sciatic nerve, or pinching of the sciatic nerve. This may be the result of:  A disk in between the bones of the spine (vertebrae) bulging out too far (herniated disk).  Age-related changes in the spinal disks (degenerative disk disease).  A pain disorder that affects a muscle in the buttock (piriformis syndrome).  Extra bone growth (bone spur) near the sciatic nerve.  An injury or break (fracture) of the pelvis.  Pregnancy.  Tumor (rare). What increases the risk? The following factors may make you more likely to develop this condition:  Playing sports that place pressure or stress on the spine, such as football or weight lifting.  Having poor strength and flexibility.  A history of back injury.  A history of back surgery.  Sitting for long periods of time.  Doing activities that involve repetitive bending or lifting.  Obesity. What are the signs or symptoms? Symptoms can vary from mild to very severe, and they may include:  Any of these problems in the lower back, leg, hip, or buttock: ? Mild tingling or dull aches. ? Burning sensations. ? Sharp pains.  Numbness in the back of the calf or the sole of the foot.  Leg weakness.  Severe back pain that makes movement difficult. These symptoms may get worse when you cough, sneeze, or laugh, or when you sit or stand for long periods of time. Being overweight may also make symptoms worse. In some cases, symptoms may recur over time. How is this diagnosed? This condition may be diagnosed based on:  Your symptoms.  A physical exam. Your health care provider may ask you to do certain movements to check whether those movements trigger your symptoms.  You may have tests, including: ? Blood tests. ? X-rays. ? MRI. ? CT scan. How is this treated? In  many cases, this condition improves on its own, without any treatment. However, treatment may include:  Reducing or modifying physical activity during periods of pain.  Exercising and stretching to strengthen your abdomen and improve the flexibility of your spine.  Icing and applying heat to the affected area.  Medicines that help: ? To relieve pain and swelling. ? To relax your muscles.  Injections of medicines that help to relieve pain, irritation, and inflammation around the sciatic nerve (steroids).  Surgery. Follow these instructions at home: Medicines  Take over-the-counter and prescription medicines only as told by your health care provider.  Do not drive or operate heavy machinery while taking prescription pain medicine. Managing pain  If directed, apply ice to the affected area. ? Put ice in a plastic bag. ? Place a towel between your skin and the bag. ? Leave the ice on for 20 minutes, 2-3 times a day.  After icing, apply heat to the affected area before you exercise or as often as told by your health care provider. Use the heat source that your health care provider recommends, such as a moist heat pack or a heating pad. ? Place a towel between your skin and the heat source. ? Leave the heat on for 20-30 minutes. ? Remove the heat if your skin turns bright red. This is especially important if you are unable to feel pain, heat, or cold. You may have a greater risk of getting burned. Activity  Return to your normal activities as told by your health care provider. Ask your health care provider what activities are safe for you. ? Avoid activities that make your symptoms worse.  Take brief periods of rest throughout the day. Resting in a lying or standing position is usually better than sitting to rest. ? When you rest for longer periods, mix in some mild activity or stretching between periods of rest. This will help to prevent stiffness and pain. ? Avoid sitting for long  periods of time without moving. Get up and move around at least one time each hour.  Exercise and stretch regularly, as told by your health care provider.  Do not lift anything that is heavier than 10 lb (4.5 kg) while you have symptoms of sciatica. When you do not have symptoms, you should still avoid heavy lifting, especially repetitive heavy lifting.  When you lift objects, always use proper lifting technique, which includes: ? Bending your knees. ? Keeping the load close to your body. ? Avoiding twisting. General instructions  Use good posture. ? Avoid leaning forward while sitting. ? Avoid hunching over while standing.  Maintain a healthy weight. Excess weight puts extra stress on your back and makes it difficult to maintain good posture.  Wear supportive, comfortable shoes. Avoid wearing high heels.  Avoid sleeping on a mattress that is too soft or too hard. A mattress that is firm enough to support your back when you sleep may help to reduce your pain.  Keep all follow-up visits as told by your health care provider. This is important. Contact a health care provider if:  You have pain that wakes you up when you are sleeping.  You have pain that gets worse when you lie down.  Your pain is worse than you have experienced in the past.  Your pain lasts longer than 4 weeks.  You experience unexplained weight loss. Get help right away if:  You lose control of your bowel or bladder (incontinence).  You have: ? Weakness in your lower back, pelvis, buttocks, or legs that gets worse. ? Redness or swelling of your back. ? A burning sensation when you urinate. This information is not intended to replace advice given to you by your health care provider. Make sure you discuss any questions you have with your health care provider. Document Released: 07/10/2001 Document Revised: 12/20/2015 Document Reviewed: 03/25/2015 Elsevier Interactive Patient Education  2019 [employer]  Gastroesophageal Reflux Disease, Adult Gastroesophageal reflux (GER) happens when acid from  the stomach flows up into the tube that connects the mouth and the stomach (esophagus). Normally, food travels down the esophagus and stays in the stomach to be digested. With GER, food and stomach acid sometimes move back up into the esophagus. You may have a disease called gastroesophageal reflux disease (GERD) if the reflux:  Happens often.  Causes frequent or very bad symptoms.  Causes problems such as damage to the esophagus. When this happens, the esophagus becomes sore and swollen (inflamed). Over time, GERD can make small holes (ulcers) in the lining of the esophagus. What are the causes? This condition is caused by a problem with the muscle between the esophagus and the stomach. When this muscle is weak or not normal, it does not close properly to keep food and acid from coming back up from the stomach. The muscle can be weak because of:  Tobacco use.  Pregnancy.  Having a certain type of hernia (hiatal hernia).  Alcohol use.  Certain foods and drinks, such as coffee, chocolate, onions, and peppermint. What increases the risk? You are more likely to develop this condition if you:  Are overweight.  Have a disease that affects your connective tissue.  Use NSAID medicines. What are the signs or symptoms? Symptoms of this condition include:  Heartburn.  Difficult or painful swallowing.  The feeling of having a lump in the throat.  A bitter taste in the mouth.  Bad breath.  Having a lot of saliva.  Having an upset or bloated stomach.  Belching.  Chest pain. Different conditions can cause chest pain. Make sure you see your doctor if you have chest pain.  Shortness of breath or noisy breathing (wheezing).  Ongoing (chronic) cough or a cough at night.  Wearing away of the surface of teeth (tooth enamel).  Weight loss. How is this treated? Treatment will depend  on how bad your symptoms are. Your doctor may suggest:  Changes to your diet.  Medicine.  Surgery. Follow these instructions at home: Eating and drinking   Follow a diet as told by your doctor. You may need to avoid foods and drinks such as: ? Coffee and tea (with or without caffeine). ? Drinks that contain alcohol. ? Energy drinks and sports drinks. ? Bubbly (carbonated) drinks or sodas. ? Chocolate and cocoa. ? Peppermint and mint flavorings. ? Garlic and onions. ? Horseradish. ? Spicy and acidic foods. These include peppers, chili powder, curry powder, vinegar, hot sauces, and BBQ sauce. ? Citrus fruit juices and citrus fruits, such as oranges, lemons, and limes. ? Tomato-based foods. These include red sauce, chili, salsa, and pizza with red sauce. ? Fried and fatty foods. These include donuts, french fries, potato chips, and high-fat dressings. ? High-fat meats. These include hot dogs, rib eye steak, sausage, ham, and bacon. ? High-fat dairy items, such as whole milk, butter, and cream cheese.  Eat small meals often. Avoid eating large meals.  Avoid drinking large amounts of liquid with your meals.  Avoid eating meals during the 2-3 hours before bedtime.  Avoid lying down right after you eat.  Do not exercise right after you eat. Lifestyle   Do not use any products that contain nicotine or tobacco. These include cigarettes, e-cigarettes, and chewing tobacco. If you need help quitting, ask your doctor.  Try to lower your stress. If you need help doing this, ask your doctor.  If you are overweight, lose an amount of weight that is healthy for you. Ask your doctor about  a safe weight loss goal. General instructions  Pay attention to any changes in your symptoms.  Take over-the-counter and prescription medicines only as told by your doctor. Do not take aspirin, ibuprofen, or other NSAIDs unless your doctor says it is okay.  Wear loose clothes. Do not wear anything  tight around your waist.  Raise (elevate) the head of your bed about 6 inches (15 cm).  Avoid bending over if this makes your symptoms worse.  Keep all follow-up visits as told by your doctor. This is important. Contact a doctor if:  You have new symptoms.  You lose weight and you do not know why.  You have trouble swallowing or it hurts to swallow.  You have wheezing or a cough that keeps happening.  Your symptoms do not get better with treatment.  You have a hoarse voice. Get help right away if:  You have pain in your arms, neck, jaw, teeth, or back.  You feel sweaty, dizzy, or light-headed.  You have chest pain or shortness of breath.  You throw up (vomit) and your throw-up looks like blood or coffee grounds.  You pass out (faint).  Your poop (stool) is bloody or black.  You cannot swallow, drink, or eat. Summary  If a person has gastroesophageal reflux disease (GERD), food and stomach acid move back up into the esophagus and cause symptoms or problems such as damage to the esophagus.  Treatment will depend on how bad your symptoms are.  Follow a diet as told by your doctor.  Take all medicines only as told by your doctor. This information is not intended to replace advice given to you by your health care provider. Make sure you discuss any questions you have with your health care provider. Document Released: 01/02/2008 Document Revised: 01/22/2018 Document Reviewed: 01/22/2018 Elsevier Interactive Patient Education  [employer].   If you have lab work done today you will be contacted with your lab results within the next 2 weeks.  If you have not heard from [place] then please contact us. The fastest way to get your results is to register for My Chart.   IF you received an x-ray today, you will receive an invoice from [facility]. Please contact [facility] at [phone] with questions or concerns regarding your invoice.   IF you  received labwork today, you will receive an invoice from [facility]. Please contact [facility] at [phone] with questions or concerns regarding your invoice.   Our billing staff will not be able to assist you with questions regarding bills from these companies.  You will be contacted with the lab results as soon as they are available. The fastest way to get your results is to activate your My Chart account. Instructions are located on the last page of this paperwork. If you have not heard from [place] regarding the results in 2 weeks, please contact this office.        No follow-ups on file.   Janace Hoard, MD 08/04/2018

## 2018-08-22 ENCOUNTER — Ambulatory Visit: Payer: Self-pay | Admitting: *Deleted

## 2018-08-22 NOTE — Telephone Encounter (Signed)
Was seen on 08/04/18 by Dr. Alwyn Ren for asymptomatic varicose veins per LOV note. Reported the veins on the inner aspect of the right leg, front ankle to groin are swollen and having intermittent pain all the way up her leg were they run regardless of activity or resting.  Denies additional swelling in leg/foot.No redness or hardness of area reported. Denies any numbness in the leg/foot/SOB.  Stated this leg feels weaker than left leg. No weakness of Right arm.She wears support hose, elevates her legs, stays off them as much as possible. Hx of clotted artery and has dopplers yearly.  Takes amlodipine and plavix daily. Desires to see her PCP. Reviewed symptoms that would require immediate emergency evaluation. Stated understanding. Appointment made for Monday with PCP. Reason for Disposition . Caused by previously diagnosed varicose veins (same pain, worsened by prolonged standing, bulging veins in legs with worm-like appearance)  Additional Information . Negative: Localized pain, redness or hard lump along vein    No redness no hardness  Answer Assessment - Initial Assessment Questions 1. ONSET: "When did the pain start?"      Early January 2. LOCATION: "Where is the pain located?"      Right leg veins are swollen and raised. 3. PAIN: "How bad is the pain?"    (Scale 1-10; or mild, moderate, severe)   -  MILD (1-3): doesn't interfere with normal activities    -  MODERATE (4-7): interferes with normal activities (e.g., work or school) or awakens from sleep, limping    -  SEVERE (8-10): excruciating pain, unable to do any normal activities, unable to walk     moderate 4. WORK OR EXERCISE: "Has there been any recent work or exercise that involved this part of the body?"     no 5. CAUSE: "What do you think is causing the leg pain?"     Leg veins 6. OTHER SYMPTOMS: "Do you have any other symptoms?" (e.g., chest pain, back pain, breathing difficulty, swelling, rash, fever, numbness, weakness)     Leg  veins are swollen. Rt leg weaker than right. 7. PREGNANCY: "Is there any chance you are pregnant?" "When was your last menstrual period?"     no  Protocols used: LEG PAIN-A-AH

## 2018-08-25 ENCOUNTER — Encounter: Payer: Self-pay | Admitting: Family Medicine

## 2018-08-25 ENCOUNTER — Other Ambulatory Visit: Payer: Self-pay

## 2018-08-25 ENCOUNTER — Ambulatory Visit (INDEPENDENT_AMBULATORY_CARE_PROVIDER_SITE_OTHER): Payer: PPO | Admitting: Family Medicine

## 2018-08-25 VITALS — BP 120/80 | HR 69 | Temp 97.8°F | Resp 17 | Ht 65.04 in | Wt 158.0 lb

## 2018-08-25 DIAGNOSIS — I8311 Varicose veins of right lower extremity with inflammation: Secondary | ICD-10-CM | POA: Diagnosis not present

## 2018-08-25 NOTE — Progress Notes (Signed)
Established Patient Office Visit  Subjective:  Patient ID: [name], female    DOB: 10/29/1940  Age: 78 y.o. MRN: [id number]  CC:  Chief Complaint  Patient presents with  . Varicose Veins    painful x last 3 months, starts at feet and radiates up the leg.  ? Acid reflux-given medicationd doesn't help and has paper towels in bed with her cause she brings alot up during the night    HPI [name] presents for   Reflux She reports that she has been having some mucus in her throat and was told it was reflux She states that she has been told to take reflux medications She has a history of barrett's esophagus  Varicose Veins She states that she had pain from the right lower extremity varicose veins She is wearing compression stockings She states that she gets pain on the inner thigh She states that she takes aspirin for pain from the veins    Past Medical History:  Diagnosis Date  . Acute sinus infection 01/15/2011  . ANXIETY 10/03/2009  . BARRETTS ESOPHAGUS 10/03/2009  . CAROTID ARTERY DISEASE 08/31/2009  . COLONIC POLYPS, HX OF 08/29/2009  . CONSTIPATION, CHRONIC 10/03/2009  . DEPRESSION 10/03/2009  . DISC DISEASE, CERVICAL 08/29/2009  . DISC DISEASE, LUMBAR 08/29/2009  . Eustachian tube dysfunction 01/15/2011  . GERD 08/29/2009  . HYPERLIPIDEMIA 08/29/2009  . Impaired glucose tolerance 01/14/2011  . LEG PAIN, BILATERAL 08/29/2009  . LOW BACK PAIN, CHRONIC 11/22/2009  . PERIPHERAL NEUROPATHY 10/03/2009  . PERIPHERAL VASCULAR DISEASE 08/29/2009  . POSITIVE PPD 10/03/2009  . Preventative health care 01/14/2011  . Stroke (HCC)   . Thyroid nodule 08/09/2011  . TIA 11/22/2009  . TRANSIENT ISCHEMIC ATTACK, HX OF 08/29/2009  . URETHRAL STRICTURE 10/03/2009  . VOCAL CORD POLYP, HX OF 10/03/2009  . WRIST PAIN, BILATERAL 05/25/2010    Past Surgical History:  Procedure Laterality Date  . hx of right ankle fracture    . s/p vocal cord polyps  1984   chronic hoarseness  . stress test  negative  2003,2004,2006  . TUBAL LIGATION    . tubaligation    . uterine prolapse      Family History  Problem Relation Age of Onset  . Stroke Mother   . Heart disease Mother   . Hypertension Mother   . Diabetes Mother   . Multiple myeloma Sister   . Cancer Other        lung    Social History   Socioeconomic History  . Marital status: Widowed    Spouse name: Not on file  . Number of children: 3  . Years of education: Not on file  . Highest education level: Not on file  Occupational History  . 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 Financial resource strain: Not on file  . Food insecurity:    Worry: Not on file    Inability: Not on file  . Transportation needs:    Medical: Not on file    Non-medical: Not on file  Tobacco Use  . Smoking status: Never Smoker  . Smokeless tobacco: Never Used  Substance and Sexual Activity  . Alcohol use: No  . Drug use: No  . Sexual activity: Not Currently  Lifestyle  . Physical activity:    Days per week: Not on file    Minutes per session: Not on file  . Stress: Not on file  Relationships  . Social connections:    Talks on phone: Not on file    Gets together: Not on file    Attends religious service: Not on file    Active member of club or organization: Not on file    Attends meetings of clubs or organizations: Not on file    Relationship status: Not on file  . Intimate partner violence:    Fear of current or ex partner: Not on file    Emotionally abused: Not on file    Physically abused: Not on file    Forced sexual activity: Not on file  Other Topics Concern  . Not on file  Social History Narrative   Husband with dementia    Outpatient Medications Prior to Visit  Medication Sig Dispense Refill  . amLODipine (NORVASC) 5 MG tablet TAKE 1 TABLET BY MOUTH EVERY DAY 30 tablet 0  . clopidogrel (PLAVIX) 75 MG tablet Take 1 tablet (75 mg total) by mouth daily. 90 tablet 1  .  Multiple Vitamins-Minerals (MULTIVITAMIN WITH MINERALS) tablet Take 1 tablet by mouth daily.    . naproxen sodium (ALEVE) 220 MG tablet Take 220 mg by mouth.    . Omega-3 Fatty Acids (FISH OIL) 1000 MG CAPS Take 1,000 mg by mouth daily.     . polyethylene glycol powder (GLYCOLAX/MIRALAX) powder Take 17 g by mouth daily. 3350 g 1  . rosuvastatin (CRESTOR) 40 MG tablet     . loratadine (CLARITIN) 10 MG tablet TAKE 1 TABLET BY MOUTH EVERY DAY 30 tablet 1  . Coenzyme Q10 10 MG capsule Take 200 mg by mouth daily.     No facility-administered medications prior to visit.     Allergies  Allergen Reactions  . Atorvastatin     REACTION: myalgia  . Penicillins Hives and Itching    Has patient had a PCN reaction causing immediate rash, facial/tongue/throat swelling, SOB or lightheadedness with hypotension: no Has patient had a PCN reaction causing severe rash involving mucus membranes or skin necrosis: no Has patient had a PCN reaction that required hospitalization : no Has patient had a PCN reaction occurring within the last 10 years: no If all of the above answers are "NO", then may proceed with Cephalosporin use.     ROS Review of Systems Review of Systems  Constitutional: Negative for activity change, appetite change, chills and fever.  HENT: Negative for congestion, nosebleeds, trouble swallowing and voice change.   Respiratory: Negative for cough, shortness of breath and wheezing.   Gastrointestinal: Negative for diarrhea, nausea and vomiting.  Genitourinary: Negative for difficulty urinating, dysuria, flank pain and hematuria.  Musculoskeletal: Negative for back pain, joint swelling and neck pain.  Neurological: Negative for dizziness, speech difficulty, light-headedness and numbness.  See HPI. All other review of systems negative.     Objective:    Physical Exam  BP 120/80 (BP Location: Left Arm, Patient Position: Sitting, Cuff Size: Normal)   Pulse 69   Temp 97.8 F (36.6 C)  (Oral)   Resp 17   Ht 5' 5.04" (1.652 m)   Wt 158 lb (71.7 kg)   SpO2 99%   BMI 26.26 kg/m  Wt Readings from Last 3 Encounters:  08/25/18 158 lb (71.7 kg)  08/04/18 157 lb 6.4 oz (71.4 kg)  06/16/18 154 lb 6.4 oz (70 kg)    Physical Exam  Constitutional: Oriented to person, place, and time. Appears well-developed and well-nourished.  HENT:  Head: Normocephalic and atraumatic.  Eyes: Conjunctivae and  EOM are normal.  Cardiovascular: Normal rate, regular rhythm, normal heart sounds and intact distal pulses.  No murmur heard. Pulmonary/Chest: Effort normal and breath sounds normal. No stridor. No respiratory distress. Has no wheezes.  Neurological: Is alert and oriented to person, place, and time.  Skin: Skin is warm. Capillary refill takes less than 2 seconds.  Psychiatric: Has a normal mood and affect. Behavior is normal. Judgment and thought content normal.   Palpable torturous varicose vein noted on the inner aspect along leg up to the groin   There are no preventive care reminders to display for this patient.  There are no preventive care reminders to display for this patient.  Lab Results  Component Value Date   TSH 0.72 03/12/2018   Lab Results  Component Value Date   WBC 4.1 (A) 07/18/2017   HGB 15.0 07/18/2017   HCT 46.0 07/18/2017   MCV 94.8 07/18/2017   PLT 137 (L) 01/08/2017   Lab Results  Component Value Date   NA 144 06/16/2018   K 4.1 06/16/2018   CO2 24 06/16/2018   GLUCOSE 87 06/16/2018   BUN 7 (L) 06/16/2018   CREATININE 0.89 06/16/2018   BILITOT 0.5 06/16/2018   ALKPHOS 75 06/16/2018   AST 38 06/16/2018   ALT 33 (H) 06/16/2018   PROT 6.8 06/16/2018   ALBUMIN 4.6 06/16/2018   CALCIUM 9.8 06/16/2018   ANIONGAP 8 01/08/2017   GFR 86.95 05/14/2011   Lab Results  Component Value Date   CHOL 164 06/16/2018   Lab Results  Component Value Date   HDL 53 06/16/2018   Lab Results  Component Value Date   LDLCALC 90 06/16/2018   Lab Results   Component Value Date   TRIG 107 06/16/2018   Lab Results  Component Value Date   CHOLHDL 3.1 06/16/2018   Lab Results  Component Value Date   HGBA1C 5.9 03/12/2018      Assessment & Plan:   Problem List Items Addressed This Visit    None    Visit Diagnoses    Varicose veins of right lower extremity with inflammation    -  Primary   Relevant Orders   Ambulatory referral to Vascular Surgery    Continue compression stockings Pt already doing conservative mgmt without improvement  Referral placed for Vein Clinic Discussed what to do if bleeding occurs   No orders of the defined types were placed in this encounter.   Follow-up: Return in about 6 months (around 02/23/2019) for follow up on varicose veins and blood pressure.    Doristine Bosworth, MD

## 2018-08-25 NOTE — Patient Instructions (Addendum)
Continue exercise Continue compression stockings Follow up with Vein Clinic      If you have lab work done today you will be contacted with your lab results within the next 2 weeks.  If you have not heard from [place] then please contact us. The fastest way to get your results is to register for My Chart.   IF you received an x-ray today, you will receive an invoice from [facility]. Please contact [facility] at [phone] with questions or concerns regarding your invoice.   IF you received labwork today, you will receive an invoice from [facility]. Please contact [facility] at [phone] with questions or concerns regarding your invoice.   Our billing staff will not be able to assist you with questions regarding bills from these companies.  You will be contacted with the lab results as soon as they are available. The fastest way to get your results is to activate your My Chart account. Instructions are located on the last page of this paperwork. If you have not heard from [place] regarding the results in 2 weeks, please contact this office.     Varicose Veins Varicose veins are veins that have become enlarged, bulged, and twisted. They most often appear in the legs. What are the causes? This condition is caused by damage to the valves in the vein. These valves help blood return to your heart. When they are damaged and they stop working properly, blood may flow backward and back up in the veins near the skin, causing the veins to get larger and appear twisted. The condition can result from any issue that causes blood to back up, like pregnancy, prolonged standing, or obesity. What increases the risk? This condition is more likely to develop in people who are:  On their feet a lot.  Pregnant.  Overweight. What are the signs or symptoms? Symptoms of this condition include:  Bulging, twisted, and bluish veins.  A feeling of heaviness. This may be worse at the end of the  day.  Leg pain. This may be worse at the end of the day.  Swelling in the leg.  Changes in skin color over the veins. How is this diagnosed? This condition may be diagnosed based on your symptoms, a physical exam, and an ultrasound test. How is this treated? Treatment for this condition may involve:  Avoiding sitting or standing in one position for long periods of time.  Wearing compression stockings. These stockings help to prevent blood clots and reduce swelling in the legs.  Raising (elevating) the legs when resting.  Losing weight.  Exercising regularly. If you have persistent symptoms or want to improve the way your varicose veins look, you may choose to have a procedure to close the varicose veins off or to remove them. Treatments to close off the veins include:  Sclerotherapy. In this treatment, a solution is injected into a vein to close it off.  Laser treatment. In this treatment, the vein is heated with a laser to close it off.  Radiofrequency vein ablation. In this treatment, an electrical current produced by radio waves is used to close off the vein. Treatments to remove the veins include:  Phlebectomy. In this treatment, the veins are removed through small incisions made over the veins.  Vein ligation and stripping. In this treatment, incisions are made over the veins. The veins are then removed after being tied (ligated) with stitches (sutures). Follow these instructions at home: Activity  Walk as much as possible. Walking increases  blood flow. This helps blood return to the heart and takes pressure off your veins. It also increases your cardiovascular strength.  Follow your health care provider's instructions about exercising.  Do not stand or sit in one position for a long period of time.  Do not sit with your legs crossed.  Rest with your legs raised during the day. General instructions   Follow any diet instructions given to you by your health care  provider.  Wear compression stockings as directed by your health care provider. Do not wear other kinds of tight clothing around your legs, pelvis, or waist.  Elevate your legs at night to above the level of your heart.  If you get a cut in the skin over the varicose vein and the vein bleeds: ? Lie down with your leg raised. ? Apply firm pressure to the cut with a clean cloth until the bleeding stops. ? Place a bandage (dressing) on the cut. Contact a health care provider if:  The skin around your varicose veins starts to break down.  You have pain, redness, tenderness, or hard swelling over a vein.  You are uncomfortable because of pain.  You get a cut in the skin over a varicose vein and it will not stop bleeding. Summary  Varicose veins are veins that have become enlarged, bulged, and twisted. They most often appear in the legs.  This condition is caused by damage to the valves in the vein. These valves help blood return to your heart.  Treatment for this condition includes frequent movements, wearing compression stockings, losing weight, and exercising regularly. In some cases, procedures are done to close off or remove the veins.  Treatment for this condition may include wearing compression stockings, elevating the legs, losing weight, and engaging in regular activity. In some cases, procedures are done to close off or remove the veins. This information is not intended to replace advice given to you by your health care provider. Make sure you discuss any questions you have with your health care provider. Document Released: 04/25/2005 Document Revised: 08/08/2016 Document Reviewed: 08/08/2016 Elsevier Interactive Patient Education  2019 [employer]

## 2018-08-26 ENCOUNTER — Other Ambulatory Visit: Payer: Self-pay | Admitting: Internal Medicine

## 2018-09-04 ENCOUNTER — Other Ambulatory Visit: Payer: Self-pay | Admitting: Family Medicine

## 2018-09-04 DIAGNOSIS — Z1231 Encounter for screening mammogram for malignant neoplasm of breast: Secondary | ICD-10-CM

## 2018-09-17 DIAGNOSIS — Z86010 Personal history of colonic polyps: Secondary | ICD-10-CM | POA: Diagnosis not present

## 2018-09-17 DIAGNOSIS — N819 Female genital prolapse, unspecified: Secondary | ICD-10-CM | POA: Diagnosis not present

## 2018-09-17 DIAGNOSIS — Z1211 Encounter for screening for malignant neoplasm of colon: Secondary | ICD-10-CM | POA: Diagnosis not present

## 2018-09-17 DIAGNOSIS — K5900 Constipation, unspecified: Secondary | ICD-10-CM | POA: Diagnosis not present

## 2018-09-24 ENCOUNTER — Other Ambulatory Visit: Payer: Self-pay | Admitting: Internal Medicine

## 2018-10-03 ENCOUNTER — Ambulatory Visit
Admission: RE | Admit: 2018-10-03 | Discharge: 2018-10-03 | Disposition: A | Discharge status: Home / Self Care | Payer: PPO | Source: Ambulatory Visit | Attending: Family Medicine | Admitting: Family Medicine

## 2018-10-03 DIAGNOSIS — Z1231 Encounter for screening mammogram for malignant neoplasm of breast: Secondary | ICD-10-CM | POA: Diagnosis not present

## 2018-10-10 ENCOUNTER — Telehealth: Payer: Self-pay | Admitting: Family Medicine

## 2018-10-10 NOTE — Telephone Encounter (Signed)
Copied from CRM [id number]. Topic: Referral - Request for Referral >> Oct 09, 2018  3:56 PM Arlyss Gandy, NT wrote: Has patient seen PCP for this complaint? Yes.   *If NO, is insurance requiring patient see PCP for this issue before PCP can refer them? Referral for which specialty: Vascular and Vein Preferred provider/office: any except Dr. Arbie Cookey Reason for referral: Pt would like to referral changed to any provider accept for Dr. Bosie Helper office as her husband had a heart attack in that office and she cannot bare to go in there yet.

## 2018-10-10 NOTE — Telephone Encounter (Signed)
Patient requested to be sent to another office  Referral was faxed to [facility] [phone]    10/10/2018

## 2018-10-21 ENCOUNTER — Encounter (HOSPITAL_COMMUNITY): Payer: PPO

## 2018-10-21 ENCOUNTER — Encounter: Payer: PPO | Admitting: Vascular Surgery

## 2018-10-21 ENCOUNTER — Other Ambulatory Visit: Payer: Self-pay | Admitting: Internal Medicine

## 2018-11-24 ENCOUNTER — Other Ambulatory Visit: Payer: Self-pay | Admitting: Cardiology

## 2018-11-24 DIAGNOSIS — I6523 Occlusion and stenosis of bilateral carotid arteries: Secondary | ICD-10-CM

## 2018-12-15 ENCOUNTER — Other Ambulatory Visit: Payer: Self-pay | Admitting: Internal Medicine

## 2019-01-01 ENCOUNTER — Ambulatory Visit: Payer: PPO | Admitting: Internal Medicine

## 2019-01-01 ENCOUNTER — Ambulatory Visit: Payer: PPO

## 2019-01-13 ENCOUNTER — Ambulatory Visit: Payer: PPO | Admitting: Family Medicine

## 2019-01-13 ENCOUNTER — Telehealth: Payer: Self-pay | Admitting: Family Medicine

## 2019-01-13 NOTE — Telephone Encounter (Signed)
Called pt to reschedule appt. Provider out sick. LVMTCB

## 2019-01-15 ENCOUNTER — Other Ambulatory Visit: Payer: Self-pay

## 2019-01-15 ENCOUNTER — Other Ambulatory Visit: Payer: Self-pay | Admitting: Internal Medicine

## 2019-01-15 DIAGNOSIS — I8311 Varicose veins of right lower extremity with inflammation: Secondary | ICD-10-CM

## 2019-01-19 NOTE — Telephone Encounter (Signed)
The above patient or their representative was contacted and gave the following answers to these questions:         Do you have any of the following symptoms?n  Fever                    Cough                   Shortness of breath  Do  you have any of the following other symptoms? n muscle pain         vomiting,        diarrhea        rash         weakness        red eye        abdominal pain         bruising          bruising or bleeding              joint pain           severe headache    Have you been in contact with someone who was or has been sick in the past 2 weeks?n  Yes                 Unsure                         Unable to assess   Does the person that you were in contact with have any of the following symptoms?   Cough         shortness of breath           muscle pain         vomiting,            diarrhea            rash            weakness           fever            red eye           abdominal pain           bruising  or  bleeding                joint pain                severe headache               Have you  or someone you have been in contact with traveled internationally in th last month?   n      If yes, which countries?   Have you  or someone you have been in contact with traveled outside [place] in th last month?         If yes, which state and city?   COMMENTS OR ACTION PLAN FOR THIS PATIENT:          

## 2019-01-19 NOTE — Telephone Encounter (Signed)
The above patient or their representative was contacted and gave the following answers to these questions:         Do you have any of the following symptoms?n  Fever                    Cough                   Shortness of breath  Do  you have any of the following other symptoms? n muscle pain         vomiting,        diarrhea        rash         weakness        red eye        abdominal pain         bruising          bruising or bleeding              joint pain           severe headache    Have you been in contact with someone who was or has been sick in the past 2 weeks?n  Yes                 Unsure                         Unable to assess   Does the person that you were in contact with have any of the following symptoms?   Cough         shortness of breath           muscle pain         vomiting,            diarrhea            rash            weakness           fever            red eye           abdominal pain           bruising  or  bleeding                joint pain                severe headache               Have you  or someone you have been in contact with traveled internationally in th last month?   n      If yes, which countries?   Have you  or someone you have been in contact with traveled outside [place] in th last month?         If yes, which state and city?   COMMENTS OR ACTION PLAN FOR THIS PATIENT:          

## 2019-01-20 ENCOUNTER — Encounter: Payer: Self-pay | Admitting: Family

## 2019-01-20 ENCOUNTER — Encounter: Payer: PPO | Admitting: Vascular Surgery

## 2019-01-20 ENCOUNTER — Ambulatory Visit (HOSPITAL_COMMUNITY): Payer: PPO | Attending: Vascular Surgery

## 2019-01-21 ENCOUNTER — Encounter: Payer: Self-pay | Admitting: Family Medicine

## 2019-01-21 ENCOUNTER — Other Ambulatory Visit: Payer: Self-pay

## 2019-01-21 ENCOUNTER — Ambulatory Visit (INDEPENDENT_AMBULATORY_CARE_PROVIDER_SITE_OTHER): Payer: PPO | Admitting: Family Medicine

## 2019-01-21 VITALS — BP 135/78 | HR 83 | Temp 98.2°F | Resp 16 | Ht 65.04 in | Wt 162.2 lb

## 2019-01-21 DIAGNOSIS — I10 Essential (primary) hypertension: Secondary | ICD-10-CM | POA: Diagnosis not present

## 2019-01-21 DIAGNOSIS — R0789 Other chest pain: Secondary | ICD-10-CM

## 2019-01-21 DIAGNOSIS — I639 Cerebral infarction, unspecified: Secondary | ICD-10-CM

## 2019-01-21 DIAGNOSIS — R5383 Other fatigue: Secondary | ICD-10-CM

## 2019-01-21 DIAGNOSIS — I6930 Unspecified sequelae of cerebral infarction: Secondary | ICD-10-CM | POA: Diagnosis not present

## 2019-01-21 MED ORDER — ASPIRIN 81 MG PO CHEW: 324.0000 mg | CHEWABLE_TABLET | Freq: Once | ORAL | Status: DC

## 2019-01-21 MED ORDER — ROSUVASTATIN CALCIUM 40 MG PO TABS: 40.0000 mg | ORAL_TABLET | Freq: Every day | ORAL | 3 refills | Status: DC

## 2019-01-21 MED ORDER — CLOPIDOGREL BISULFATE 75 MG PO TABS: 75.0000 mg | ORAL_TABLET | Freq: Every day | ORAL | 3 refills | Status: DC

## 2019-01-21 NOTE — Patient Instructions (Signed)
° ° ° °  If you have lab work done today you will be contacted with your lab results within the next 2 weeks.  If you have not heard from us then please contact us. The fastest way to get your results is to register for My Chart. ° ° °IF you received an x-ray today, you will receive an invoice from [facility]. Please contact [facility] at [phone] with questions or concerns regarding your invoice.  ° °IF you received labwork today, you will receive an invoice from [facility]. Please contact [facility] at [phone] with questions or concerns regarding your invoice.  ° °Our billing staff will not be able to assist you with questions regarding bills from these companies. ° °You will be contacted with the lab results as soon as they are available. The fastest way to get your results is to activate your My Chart account. Instructions are located on the last page of this paperwork. If you have not heard from us regarding the results in 2 weeks, please contact this office. °  ° ° ° °

## 2019-01-21 NOTE — Progress Notes (Signed)
Established Patient Office Visit  Subjective:  Patient ID: [name], female    DOB: 07/17/1941  Age: 78 y.o. MRN: [id number]  CC:  Chief Complaint  Patient presents with  . Chest Pain    x 1 week but having more frequent episodes, pain is 8/10, quick sharp pain left side between sternum and left breast.  Per pt cause of cp may be because she thru away all her meds when disposing of decease husband's meds.  C/o fatigue x 1 week only enough energy to go from one room to the other.  Per pt stumbling when she walks attributes it to varicose veins pt has appt with dr. tomorrow.  Pt c/o of have crazy action dreams where she is jumping off of bed, kicking someone's butt, running behind someone  . Medication Refill    amlodipine, plavix, loratadine, miralax and rosuvastatin.     HPI [name] presents for   Patient reports that she has a week long history of chest pain that is in the deep tissue of her left breast She reports that the pain is 8/10 It is there all the time She reports that it is sharp She denies any radiation and there is no diaphoresis She reports that 2 weeks ago she was cleaning up and accidentally through out her medications at the same time as she was cleaning out her husbands old meds. She states that she get the pain even if she is resting. She reports tha she has some fatigue and low evergys She took ibuprofen which did not help her pain   She reports that she has been having crazy action filled dreams were she is jumping off the bed and fighting or running behind someone This has been happening for six months She states that she has knocked the lamp off the stand She states that she screams in her sleep She reports that she wakes up but is able to go back to sleep She denies sleep walking She does not use any sleep aides She is going to sleep at 1:30am and sleeps until 11am    Past Medical History:  Diagnosis Date  . Acute sinus infection  01/15/2011  . ANXIETY 10/03/2009  . BARRETTS ESOPHAGUS 10/03/2009  . CAROTID ARTERY DISEASE 08/31/2009  . COLONIC POLYPS, HX OF 08/29/2009  . CONSTIPATION, CHRONIC 10/03/2009  . DEPRESSION 10/03/2009  . DISC DISEASE, CERVICAL 08/29/2009  . DISC DISEASE, LUMBAR 08/29/2009  . Eustachian tube dysfunction 01/15/2011  . GERD 08/29/2009  . HYPERLIPIDEMIA 08/29/2009  . Impaired glucose tolerance 01/14/2011  . LEG PAIN, BILATERAL 08/29/2009  . LOW BACK PAIN, CHRONIC 11/22/2009  . PERIPHERAL NEUROPATHY 10/03/2009  . PERIPHERAL VASCULAR DISEASE 08/29/2009  . POSITIVE PPD 10/03/2009  . Preventative health care 01/14/2011  . Stroke (HCC)   . Thyroid nodule 08/09/2011  . TIA 11/22/2009  . TRANSIENT ISCHEMIC ATTACK, HX OF 08/29/2009  . URETHRAL STRICTURE 10/03/2009  . VOCAL CORD POLYP, HX OF 10/03/2009  . WRIST PAIN, BILATERAL 05/25/2010    Past Surgical History:  Procedure Laterality Date  . hx of right ankle fracture    . s/p vocal cord polyps  1984   chronic hoarseness  . stress test negative  2003,2004,2006  . TUBAL LIGATION    . tubaligation    . uterine prolapse      Family History  Problem Relation Age of Onset  . Stroke Mother   . Heart disease Mother   . Hypertension Mother   .  Diabetes Mother   . Multiple myeloma Sister   . Cancer Other        lung    Social History   Socioeconomic History  . Marital status: Widowed    Spouse name: Not on file  . Number of children: 3  . Years of education: Not on file  . Highest education level: Not on file  Occupational History  . 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 Financial resource strain: Not on file  . Food insecurity    Worry: Not on file    Inability: Not on file  . Transportation needs    Medical: Not on file    Non-medical: Not on file  Tobacco Use  . Smoking status: Never Smoker  . Smokeless tobacco: Never Used  Substance and Sexual Activity  . Alcohol use: No  . Drug use: No  . Sexual  activity: Not Currently  Lifestyle  . Physical activity    Days per week: Not on file    Minutes per session: Not on file  . Stress: Not on file  Relationships  . Social Musician on phone: Not on file    Gets together: Not on file    Attends religious service: Not on file    Active member of club or organization: Not on file    Attends meetings of clubs or organizations: Not on file    Relationship status: Not on file  . Intimate partner violence    Fear of current or ex partner: Not on file    Emotionally abused: Not on file    Physically abused: Not on file    Forced sexual activity: Not on file  Other Topics Concern  . Not on file  Social History Narrative   Husband with dementia    Outpatient Medications Prior to Visit  Medication Sig Dispense Refill  . Coenzyme Q10 10 MG capsule Take 200 mg by mouth daily.    Marland Kitchen loratadine (CLARITIN) 10 MG tablet TAKE 1 TABLET BY MOUTH EVERY DAY 30 tablet 1  . Multiple Vitamins-Minerals (MULTIVITAMIN WITH MINERALS) tablet Take 1 tablet by mouth daily.    . naproxen sodium (ALEVE) 220 MG tablet Take 220 mg by mouth.    . Omega-3 Fatty Acids (FISH OIL) 1000 MG CAPS Take 1,000 mg by mouth daily.     . polyethylene glycol powder (GLYCOLAX/MIRALAX) powder Take 17 g by mouth daily. 3350 g 1  . amLODipine (NORVASC) 5 MG tablet TAKE 1 TABLET BY MOUTH EVERY DAY 30 tablet 0  . clopidogrel (PLAVIX) 75 MG tablet Take 1 tablet (75 mg total) by mouth daily. 90 tablet 1  . rosuvastatin (CRESTOR) 40 MG tablet      No facility-administered medications prior to visit.     Allergies  Allergen Reactions  . Atorvastatin     REACTION: myalgia  . Penicillins Hives and Itching    Has patient had a PCN reaction causing immediate rash, facial/tongue/throat swelling, SOB or lightheadedness with hypotension: no Has patient had a PCN reaction causing severe rash involving mucus membranes or skin necrosis: no Has patient had a PCN reaction that  required hospitalization : no Has patient had a PCN reaction occurring within the last 10 years: no If all of the above answers are "NO", then may proceed with Cephalosporin use.     ROS Review of Systems See hpi Review of Systems  Constitutional: Negative for activity change,  appetite change, chills and fever.  HENT: Negative for congestion, nosebleeds, trouble swallowing and voice change.   Respiratory: Negative for cough, shortness of breath and wheezing.   Gastrointestinal: Negative for diarrhea, nausea and vomiting.  Genitourinary: Negative for difficulty urinating, dysuria, flank pain and hematuria.  Musculoskeletal: Negative for back pain, joint swelling and neck pain.  Neurological: Negative for dizziness, speech difficulty, light-headedness and numbness.  See HPI. All other review of systems negative.     Objective:     BP 135/78 (BP Location: Right Arm, Patient Position: Sitting, Cuff Size: Normal)   Pulse 83   Temp 98.2 F (36.8 C) (Oral)   Resp 16   Ht 5' 5.04" (1.652 m)   Wt 162 lb 3.2 oz (73.6 kg)   SpO2 98%   BMI 26.96 kg/m  Wt Readings from Last 3 Encounters:  01/21/19 162 lb 3.2 oz (73.6 kg)  08/25/18 158 lb (71.7 kg)  08/04/18 157 lb 6.4 oz (71.4 kg)    Physical Exam  Constitutional: She is oriented to person, place, and time. She appears well-developed and well-nourished.  HENT:  Head: Normocephalic and atraumatic.  Eyes: Conjunctivae and EOM are normal.  Neck: Neck supple. No thyromegaly present.  Cardiovascular: Normal rate, regular rhythm and normal heart sounds. Exam reveals no gallop and no friction rub.  No murmur heard. No heave  Pulmonary/Chest: Effort normal and breath sounds normal. No respiratory distress. She has no wheezes. She has no rales.  Abdominal: Soft. Bowel sounds are normal. She exhibits no distension and no mass. There is no abdominal tenderness. There is no rebound and no guarding.  Neurological: She is alert and oriented to  person, place, and time. She has normal reflexes.  Skin: Skin is warm. No rash noted.  Psychiatric: She has a normal mood and affect. Her behavior is normal. Judgment and thought content normal.     ECG without st elevation, no LVH, normal sinus rhythm Unchanged from baseline ecg  Health Maintenance Due  Topic Date Due  . DEXA SCAN  07/13/2004  . PNA vac Low Risk Adult (2 of 2 - PCV13) 07/30/2008    There are no preventive care reminders to display for this patient.  Lab Results  Component Value Date   TSH 0.72 03/12/2018   Lab Results  Component Value Date   WBC 4.1 (A) 07/18/2017   HGB 15.0 07/18/2017   HCT 46.0 07/18/2017   MCV 94.8 07/18/2017   PLT 137 (L) 01/08/2017   Lab Results  Component Value Date   NA 144 06/16/2018   K 4.1 06/16/2018   CO2 24 06/16/2018   GLUCOSE 87 06/16/2018   BUN 7 (L) 06/16/2018   CREATININE 0.89 06/16/2018   BILITOT 0.5 06/16/2018   ALKPHOS 75 06/16/2018   AST 38 06/16/2018   ALT 33 (H) 06/16/2018   PROT 6.8 06/16/2018   ALBUMIN 4.6 06/16/2018   CALCIUM 9.8 06/16/2018   ANIONGAP 8 01/08/2017   GFR 86.95 05/14/2011   Lab Results  Component Value Date   CHOL 164 06/16/2018   Lab Results  Component Value Date   HDL 53 06/16/2018   Lab Results  Component Value Date   LDLCALC 90 06/16/2018   Lab Results  Component Value Date   TRIG 107 06/16/2018   Lab Results  Component Value Date   CHOLHDL 3.1 06/16/2018   Lab Results  Component Value Date   HGBA1C 5.9 03/12/2018      Assessment & Plan:   Problem  List Items Addressed This Visit      Cardiovascular and Mediastinum   CVA (cerebral vascular accident) (HCC)   Relevant Medications   rosuvastatin (CRESTOR) 40 MG tablet   aspirin chewable tablet 324 mg    Other Visit Diagnoses    Essential hypertension    -  Primary   Relevant Medications   rosuvastatin (CRESTOR) 40 MG tablet   aspirin chewable tablet 324 mg   Other Relevant Orders   Lipid panel    CMP14+EGFR   Other chest pain       Relevant Medications   aspirin chewable tablet 324 mg   Other Relevant Orders   CBC   Lipid panel   CMP14+EGFR   EKG 12-Lead (Completed)   Other fatigue       Relevant Orders   VITAMIN D 25 Hydroxy (Vit-D Deficiency, Fractures)     Chest pain Restarted plavix and crestor Gave asa 324 mg in office today  Hypertension  Well controlled off meds Discontinued amlodipine  History of stroke Continue plavix   Fatigue Could be multifactorial such as sleep disturbances and stress If labs are normal then will try a sleep aid like nortriptyline  Meds ordered this encounter  Medications  . rosuvastatin (CRESTOR) 40 MG tablet    Sig: Take 1 tablet (40 mg total) by mouth daily.    Dispense:  90 tablet    Refill:  3  . clopidogrel (PLAVIX) 75 MG tablet    Sig: Take 1 tablet (75 mg total) by mouth daily.    Dispense:  90 tablet    Refill:  3  . aspirin chewable tablet 324 mg    Follow-up: No follow-ups on file.    Doristine Bosworth, MD

## 2019-01-22 DIAGNOSIS — I87391 Chronic venous hypertension (idiopathic) with other complications of right lower extremity: Secondary | ICD-10-CM | POA: Diagnosis not present

## 2019-01-22 LAB — CBC
Hematocrit: 45.7 % (ref 34.0–46.6)
Hemoglobin: 15.5 g/dL (ref 11.1–15.9)
MCH: 31.3 pg (ref 26.6–33.0)
MCHC: 33.9 g/dL (ref 31.5–35.7)
MCV: 92 fL (ref 79–97)
Platelets: 157 10*3/uL (ref 150–450)
RBC: 4.96 x10E6/uL (ref 3.77–5.28)
RDW: 14 % (ref 11.7–15.4)
WBC: 4.3 10*3/uL (ref 3.4–10.8)

## 2019-01-22 LAB — LIPID PANEL
Chol/HDL Ratio: 6.1 ratio — ABNORMAL HIGH (ref 0.0–4.4)
Cholesterol, Total: 242 mg/dL — ABNORMAL HIGH (ref 100–199)
HDL: 40 mg/dL (ref >39)
LDL Calculated: 144 mg/dL — ABNORMAL HIGH (ref 0–99)
Triglycerides: 291 mg/dL — ABNORMAL HIGH (ref 0–149)
VLDL Cholesterol Cal: 58 mg/dL — ABNORMAL HIGH (ref 5–40)

## 2019-01-22 LAB — CMP14+EGFR
ALT: 21 IU/L (ref 0–32)
AST: 29 IU/L (ref 0–40)
Albumin/Globulin Ratio: 1.8 (ref 1.2–2.2)
Albumin: 4.3 g/dL (ref 3.7–4.7)
Alkaline Phosphatase: 74 IU/L (ref 39–117)
BUN/Creatinine Ratio: 10 — ABNORMAL LOW (ref 12–28)
BUN: 10 mg/dL (ref 8–27)
Bilirubin Total: 0.6 mg/dL (ref 0.0–1.2)
CO2: 23 mmol/L (ref 20–29)
Calcium: 9.8 mg/dL (ref 8.7–10.3)
Chloride: 105 mmol/L (ref 96–106)
Creatinine, Ser: 1.03 mg/dL — ABNORMAL HIGH (ref 0.57–1.00)
GFR calc Af Amer: 60 mL/min/{1.73_m2} (ref >59)
GFR calc non Af Amer: 52 mL/min/{1.73_m2} — ABNORMAL LOW (ref >59)
Globulin, Total: 2.4 g/dL (ref 1.5–4.5)
Glucose: 96 mg/dL (ref 65–99)
Potassium: 4.2 mmol/L (ref 3.5–5.2)
Sodium: 145 mmol/L — ABNORMAL HIGH (ref 134–144)
Total Protein: 6.7 g/dL (ref 6.0–8.5)

## 2019-01-22 LAB — VITAMIN D 25 HYDROXY (VIT D DEFICIENCY, FRACTURES): Vit D, 25-Hydroxy: 22.7 ng/mL — ABNORMAL LOW (ref 30.0–100.0)

## 2019-01-22 MED ORDER — NORTRIPTYLINE HCL 10 MG PO CAPS: 10.0000 mg | ORAL_CAPSULE | Freq: Every day | ORAL | 0 refills | Status: DC

## 2019-01-22 NOTE — Addendum Note (Signed)
Addended by: Delia Chimes A on: 01/22/2019 10:09 AM   Modules accepted: Orders

## 2019-02-13 ENCOUNTER — Other Ambulatory Visit: Payer: Self-pay | Admitting: Family Medicine

## 2019-03-10 DIAGNOSIS — I83811 Varicose veins of right lower extremities with pain: Secondary | ICD-10-CM | POA: Diagnosis not present

## 2019-03-10 DIAGNOSIS — I83891 Varicose veins of right lower extremities with other complications: Secondary | ICD-10-CM | POA: Diagnosis not present

## 2019-04-24 ENCOUNTER — Ambulatory Visit: Payer: PPO | Admitting: Family Medicine

## 2019-04-27 ENCOUNTER — Encounter: Payer: Self-pay | Admitting: Family Medicine

## 2019-05-05 ENCOUNTER — Encounter (HOSPITAL_COMMUNITY): Payer: Self-pay

## 2019-05-05 ENCOUNTER — Ambulatory Visit: Payer: Self-pay | Admitting: Family Medicine

## 2019-05-05 ENCOUNTER — Other Ambulatory Visit: Payer: Self-pay

## 2019-05-05 ENCOUNTER — Emergency Department (HOSPITAL_COMMUNITY): Payer: PPO

## 2019-05-05 ENCOUNTER — Emergency Department (HOSPITAL_COMMUNITY)
Admission: EM | Admit: 2019-05-05 | Discharge: 2019-05-05 | Disposition: A | Discharge status: Home / Self Care | Payer: PPO | Attending: Emergency Medicine | Admitting: Emergency Medicine

## 2019-05-05 ENCOUNTER — Other Ambulatory Visit: Payer: PPO

## 2019-05-05 DIAGNOSIS — W01198A Fall on same level from slipping, tripping and stumbling with subsequent striking against other object, initial encounter: Secondary | ICD-10-CM | POA: Diagnosis not present

## 2019-05-05 DIAGNOSIS — M542 Cervicalgia: Secondary | ICD-10-CM | POA: Diagnosis not present

## 2019-05-05 DIAGNOSIS — Z79899 Other long term (current) drug therapy: Secondary | ICD-10-CM | POA: Diagnosis not present

## 2019-05-05 DIAGNOSIS — E041 Nontoxic single thyroid nodule: Secondary | ICD-10-CM | POA: Diagnosis not present

## 2019-05-05 DIAGNOSIS — S0003XA Contusion of scalp, initial encounter: Secondary | ICD-10-CM | POA: Diagnosis not present

## 2019-05-05 DIAGNOSIS — Z7902 Long term (current) use of antithrombotics/antiplatelets: Secondary | ICD-10-CM | POA: Diagnosis not present

## 2019-05-05 DIAGNOSIS — Y9384 Activity, sleeping: Secondary | ICD-10-CM | POA: Diagnosis not present

## 2019-05-05 DIAGNOSIS — Y999 Unspecified external cause status: Secondary | ICD-10-CM | POA: Insufficient documentation

## 2019-05-05 DIAGNOSIS — Y92003 Bedroom of unspecified non-institutional (private) residence as the place of occurrence of the external cause: Secondary | ICD-10-CM | POA: Diagnosis not present

## 2019-05-05 DIAGNOSIS — S0990XA Unspecified injury of head, initial encounter: Secondary | ICD-10-CM | POA: Diagnosis not present

## 2019-05-05 DIAGNOSIS — W19XXXA Unspecified fall, initial encounter: Secondary | ICD-10-CM

## 2019-05-05 NOTE — Discharge Instructions (Addendum)
Your head CT and neck CT were without acute finding. Incidental thyroid nodule found. You may follow up with your primary care doctor to re-evaluate this.

## 2019-05-05 NOTE — ED Triage Notes (Signed)
Patient states she got out of bed while she asleep and fell forward from a standing position. Patient c/o right lateral neck pain that radiates down in to the back. Patient has a small hematoma to the right forehead area. Patient denies blurred vision or N/V. Patient states she takes Plavix.

## 2019-05-05 NOTE — ED Notes (Signed)
Patient transported to CT 

## 2019-05-05 NOTE — ED Provider Notes (Signed)
[facility] DEPT Provider Note   CSN: [id number] Arrival date & time: 05/05/19  1254     History   Chief Complaint Chief Complaint  Patient presents with  . Fall  . Neck Pain    HPI [name] is a 78 y.o. female history of cervical disc degeneration, peripheral neuropathy, and TIA on clopidogrel.   Patient presents for head injury fall from standing at 11 PM last night that occurred when she got out of bed and fell to the floor and awoke from what seemed like a sleep walking episode. Patient states she went back to bed after injury got up this morning and noticed a bruise on her right forehead and decided to be evaluated to rule out concerning head injury.  Patient denies history of similar episodes.  Denies headache at this time, vision changes, focal weakness, change in sensation, confusion, nausea or vomiting. Pt also denies CP, SOB.  States she has some chronic weakness in her right leg that is unchanged over the past month.     HPI  Past Medical History:  Diagnosis Date  . Acute sinus infection 01/15/2011  . ANXIETY 10/03/2009  . BARRETTS ESOPHAGUS 10/03/2009  . CAROTID ARTERY DISEASE 08/31/2009  . COLONIC POLYPS, HX OF 08/29/2009  . CONSTIPATION, CHRONIC 10/03/2009  . DEPRESSION 10/03/2009  . [place] DISEASE, CERVICAL 08/29/2009  . [place] DISEASE, LUMBAR 08/29/2009  . Eustachian tube dysfunction 01/15/2011  . GERD 08/29/2009  . HYPERLIPIDEMIA 08/29/2009  . Impaired glucose tolerance 01/14/2011  . LEG PAIN, BILATERAL 08/29/2009  . LOW BACK PAIN, CHRONIC 11/22/2009  . PERIPHERAL NEUROPATHY 10/03/2009  . PERIPHERAL VASCULAR DISEASE 08/29/2009  . POSITIVE PPD 10/03/2009  . Preventative health care 01/14/2011  . Stroke ([place])   . Thyroid nodule 08/09/2011  . TIA 11/22/2009  . TRANSIENT ISCHEMIC ATTACK, HX OF 08/29/2009  . URETHRAL STRICTURE 10/03/2009  . VOCAL CORD POLYP, HX OF 10/03/2009  . WRIST PAIN, BILATERAL 05/25/2010    Patient Active Problem List   Diagnosis Date Noted  . Dizziness 08/14/2017  . Acute left hemiparesis ([place])   . TIA (transient ischemic attack) 06/01/2016  . CVA (cerebral vascular accident) ([place]) 06/01/2016  . Transient cerebral ischemia   . Complicated migraine 87/19/9412  . Multinodular goiter 08/09/2011  . Lumbar pain with radiation down right leg 04/11/2011  . Rash 04/11/2011  . Impaired glucose tolerance 01/14/2011  . Preventative health care 01/14/2011  . WRIST PAIN, BILATERAL 05/25/2010  . TIA 11/22/2009  . LOW BACK PAIN, CHRONIC 11/22/2009  . ANXIETY 10/03/2009  . DEPRESSION 10/03/2009  . PERIPHERAL NEUROPATHY 10/03/2009  . BARRETTS ESOPHAGUS 10/03/2009  . CONSTIPATION, CHRONIC 10/03/2009  . URETHRAL STRICTURE 10/03/2009  . POSITIVE PPD 10/03/2009  . VOCAL CORD POLYP, HX OF 10/03/2009  . Carotid artery disease ([place]) 08/31/2009  . Hyperlipemia 08/29/2009  . PERIPHERAL VASCULAR DISEASE 08/29/2009  . GERD 08/29/2009  . [place] DISEASE, CERVICAL 08/29/2009  . [place] DISEASE, LUMBAR 08/29/2009  . LEG PAIN, BILATERAL 08/29/2009  . TRANSIENT ISCHEMIC ATTACK, HX OF 08/29/2009  . COLONIC POLYPS, HX OF 08/29/2009    Past Surgical History:  Procedure Laterality Date  . hx of right ankle fracture    . s/p vocal cord polyps  1984   chronic hoarseness  . stress test negative  2003,2004,2006  . TUBAL LIGATION    . tubaligation    . uterine prolapse       OB History   No obstetric history on file.  Home Medications    Prior to Admission medications   Medication Sig Start Date End Date Taking? Authorizing Provider  acetaminophen (TYLENOL) 325 MG tablet Take 650 mg by mouth every 6 (six) hours as needed for mild pain or headache.   Yes [provider]  Ascorbic Acid (VITAMIN C PO) Take 1 tablet by mouth daily.   Yes [provider]  clopidogrel (PLAVIX) 75 MG tablet Take 1 tablet (75 mg total) by mouth daily. 01/21/19  Yes Stallings, Zoe A, MD  Coenzyme Q10 10 MG capsule Take 200 mg  by mouth daily.   Yes [provider]  Cyanocobalamin (VITAMIN B-12 PO) Take 1 tablet by mouth daily.   Yes [provider]  fluticasone (FLONASE) 50 MCG/ACT nasal spray Place 1 spray into both nostrils 2 (two) times daily. 04/26/19  Yes [provider]  Multiple Vitamins-Minerals (MULTIVITAMIN WITH MINERALS) tablet Take 1 tablet by mouth daily.   Yes [provider]  nortriptyline (PAMELOR) 10 MG capsule TAKE 1 CAPSULE (10 MG TOTAL) BY MOUTH AT BEDTIME. 02/13/19  Yes Stallings, Zoe A, MD  Omega-3 Fatty Acids (FISH OIL) 1000 MG CAPS Take 1,000 mg by mouth daily.    Yes [provider]  rosuvastatin (CRESTOR) 40 MG tablet Take 1 tablet (40 mg total) by mouth daily. 01/21/19  Yes Stallings, Zoe A, MD  loratadine (CLARITIN) 10 MG tablet TAKE 1 TABLET BY MOUTH EVERY DAY Patient not taking: Reported on 05/05/2019 12/15/18   Glendale Chard, MD  polyethylene glycol powder (GLYCOLAX/MIRALAX) powder Take 17 g by mouth daily. Patient not taking: Reported on 05/05/2019 04/15/18   Horald Pollen, MD    Family History Family History  Problem Relation Age of Onset  . Stroke Mother   . Heart disease Mother   . Hypertension Mother   . Diabetes Mother   . Multiple myeloma Sister   . Cancer Other        lung    Social History Social History   Tobacco Use  . Smoking status: Never Smoker  . Smokeless tobacco: Never Used  Substance Use Topics  . Alcohol use: No  . Drug use: No     Allergies   Atorvastatin and Penicillins   Review of Systems Review of Systems  All other systems reviewed and are negative.    Physical Exam Updated Vital Signs BP (!) 148/77   Pulse 63   Temp 98.3 F (36.8 C) (Oral)   Resp 16   Ht _0  (1.676 m)   Wt 75.8 kg   SpO2 100%   BMI 26.95 kg/m   Physical Exam Vitals signs and nursing note reviewed.  Constitutional:      General: She is not in acute distress. HENT:     Head: Normocephalic.     Comments:  Bruising over right eyebrow.    Nose: Nose normal.  Eyes:     General: No scleral icterus. Neck:     Musculoskeletal: Normal range of motion.     Comments: Midline tenderness to palpation of the cervical spine with cervical muscle tenderness down into trapezius. Cardiovascular:     Rate and Rhythm: Normal rate and regular rhythm.     Pulses: Normal pulses.     Heart sounds: Normal heart sounds.  Pulmonary:     Effort: Pulmonary effort is normal. No respiratory distress.     Breath sounds: No wheezing.  Abdominal:     Palpations: Abdomen is soft.     Tenderness: There  is no abdominal tenderness.  Musculoskeletal:     Right lower leg: No edema.     Left lower leg: No edema.     Comments: Right sided thoracic paraspinal tenderness no midline tenderness.  No tenderness to palpation over ankles hips knees.  Muscular tenderness over right deltoid and trapezius.  Skin:    General: Skin is warm and dry.     Capillary Refill: Capillary refill takes less than 2 seconds.  Neurological:     Mental Status: She is alert. Mental status is at baseline.  Psychiatric:        Mood and Affect: Mood normal.        Behavior: Behavior normal.      ED Treatments / Results  Labs (all labs ordered are listed, but only abnormal results are displayed) Labs Reviewed - No data to display  EKG None  Radiology No results found.  Procedures Procedures (including critical care time)  Medications Ordered in ED Medications - No data to display   Initial Impression / Assessment and Plan / ED Course  I have reviewed the triage vital signs and the nursing notes.  Pertinent labs & imaging results that were available during my care of the patient were reviewed by me and considered in my medical decision making (see chart for details).        Patient is 78 year old female presenting for fall that occurred last night with head injury and neck pain.  As patient is elderly and on blood thinners;  CT head and C-spine without acute abnormality. Degenerative changes present. Incidental thyroid nodule found.  Printed results discussed with patient. Patient understanding of plan to discharge home. Patient is to use Tylenol ibuprofen for pain.  Discussed muscle relaxer use patient agrees no need for them at this time as they are risk for causing drowsiness and side effects.  Patient will follow up with her primary care for evaluation of thyroid nodule and for reexamination after today's visit.  Vitals remained WNL during ED visit and at time of discharge.   The patient appears reasonably screened and/or stabilized for discharge and I doubt any other medical condition or other [facility] requiring further screening, evaluation, or treatment in the ED at this time prior to discharge.  Patient is hemodynamically stable, in NAD, and able to ambulate in the ED. Pain has been managed or a plan has been made for home management and has no complaints prior to discharge. Patient is comfortable with above plan and is stable for discharge at this time. All questions were answered prior to disposition. Results from the ER workup discussed with the patient face to face and all questions answered to the best of my ability. The patient is safe for discharge with strict return precautions. Patient appears safe for discharge with appropriate follow-up.  Conveyed my impression with the patient and he voiced understanding and is agreeable to plan.   An After Visit Summary was printed and given to the patient.  Portions of this note were generated with [employer]. Dictation errors may occur despite best attempts at proofreading.    Final Clinical Impressions(s) / ED Diagnoses   Final diagnoses:  None    ED Discharge Orders    None       Tedd Sias, [place] 05/05/19 1805    Drenda Freeze, MD 05/05/19 2303

## 2019-05-05 NOTE — ED Notes (Signed)
An After Visit Summary was printed and given to the patient.  Discharge instructions given and no further questions at this time. Pt states granddaughter is driving her home. Pt ambulatory.

## 2019-05-05 NOTE — Telephone Encounter (Signed)
Pt reports fell out of bed last night. Denies LOC.  States "Knot above my right eye, size of 1/2 dollar." Reports immediately applied ice to area. States this am 6/10 pain of neck and right shoulder. States pain is present with and without movement. Reports ROM WNL but "very painful." States area has "Some swelling" at shoulder. Reports bruising at eye, "Slight." [place] called practice for consideration of appt today, none available. Pt directed to ED, states will follow disposition.  Reason for Disposition . [1] Age over 30 years AND [2] swelling or bruise  Answer Assessment - Initial Assessment Questions 1. MECHANISM: "How did the injury happen?" For falls, ask: "What height did you fall from?" and "What surface did you fall against?"      [address] out of bed 2. ONSET: "When did the injury happen?" (Minutes or hours ago)      LAst night 3. NEUROLOGIC SYMPTOMS: "Was there any loss of consciousness?" "Are there any other neurological symptoms?"      No 4. MENTAL STATUS: "Does the person know who he is, who you are, and where he is?"      yes 5. LOCATION: "What part of the head was hit?"      Above right eye 6. SCALP APPEARANCE: "What does the scalp look like? Is it bleeding now?" If so, ask: "Is it difficult to stop?"      no 7. SIZE: For cuts, bruises, or swelling, ask: "How large is it?" (e.g., inches or centimeters)      8. PAIN: "Is there any pain?" If so, ask: "How bad is it?"  (e.g., Scale 1-10; or mild, moderate, severe)    6/10 9. TETANUS: For any breaks in the skin, ask: "When was the last tetanus booster?"      10. OTHER SYMPTOMS: "Do you have any other symptoms?" (e.g., neck pain, vomiting)      Neck and shouder pain  Protocols used: HEAD INJURY-A-AH

## 2019-05-13 ENCOUNTER — Ambulatory Visit: Payer: PPO | Admitting: Cardiology

## 2019-05-19 ENCOUNTER — Ambulatory Visit: Payer: PPO | Admitting: Family Medicine

## 2019-05-19 ENCOUNTER — Other Ambulatory Visit: Payer: Self-pay

## 2019-05-19 ENCOUNTER — Ambulatory Visit (INDEPENDENT_AMBULATORY_CARE_PROVIDER_SITE_OTHER): Payer: PPO | Admitting: Family Medicine

## 2019-05-19 ENCOUNTER — Ambulatory Visit (INDEPENDENT_AMBULATORY_CARE_PROVIDER_SITE_OTHER): Payer: PPO

## 2019-05-19 ENCOUNTER — Encounter: Payer: Self-pay | Admitting: Family Medicine

## 2019-05-19 VITALS — BP 128/79 | HR 73 | Temp 98.3°F | Ht 66.0 in | Wt 159.0 lb

## 2019-05-19 DIAGNOSIS — M25562 Pain in left knee: Secondary | ICD-10-CM

## 2019-05-19 DIAGNOSIS — S3992XA Unspecified injury of lower back, initial encounter: Secondary | ICD-10-CM | POA: Diagnosis not present

## 2019-05-19 DIAGNOSIS — M545 Low back pain, unspecified: Secondary | ICD-10-CM

## 2019-05-19 DIAGNOSIS — S8992XA Unspecified injury of left lower leg, initial encounter: Secondary | ICD-10-CM | POA: Diagnosis not present

## 2019-05-19 DIAGNOSIS — R296 Repeated falls: Secondary | ICD-10-CM | POA: Diagnosis not present

## 2019-05-19 MED ORDER — DICLOFENAC SODIUM 1 % TD GEL: 4.0000 g | Freq: Four times a day (QID) | TRANSDERMAL | 0 refills | Status: DC

## 2019-05-19 NOTE — Progress Notes (Signed)
10/20/20205:10 PM  [name] 1941-02-11, 78 y.o., female [id number]  Chief Complaint  Patient presents with  . Pain    due to fall, er notes present, taking tylenol for the pain in the left knee and lower bk. Larey Seat again after er visit from the bed, thinks this is the reason for knee pain    HPI:   Patient is a 78 y.o. female with past medical history significant for HTN, CVA on plavix, GERD, DDD peripheral neuropathy who presents today for pain after fall  PCP Dr Creta Levin  Has very active dreams since death of husband a year ago Started on nortriptyline She has had multiple falls from sleep Has had to place bed rails She just stopped nortriptyline as falls started happening after this meidcation  Seen in ER on oct 6th 2020 for neck pain after fall, CT head and CT neck normal  Most recent fall onto her left side Having significant left knee pain Difficult to walk so started to use cane Denies any swelling Feels stiff and gives out but no locking  Also having low back pain, mostly towards middle Denies any radiation of pain Denies any numbness, tingling, changes in bowel or bladder function  Depression screen [facility] 2/9 05/19/2019 01/21/2019 08/25/2018  Decreased Interest 0 0 0  Down, Depressed, Hopeless 0 2 0  PHQ - 2 Score 0 2 0  Altered sleeping - 3 -  Tired, decreased energy - 3 -  Change in appetite - 3 -  Feeling bad or failure about yourself  - 0 -  Trouble concentrating - 1 -  Moving slowly or fidgety/restless - 0 -  Suicidal thoughts - 0 -  PHQ-9 Score - 12 -  Difficult doing work/chores - Not difficult at all -    Fall Risk  05/19/2019 01/21/2019 08/25/2018 08/04/2018 06/16/2018  Falls in the past year? 0 0 0 1 1  Number falls in past yr: 0 0 - 1 1  Comment - - - pt states that she falls out of bed a lot -  Injury with Fall? 0 1 - (No Data) 0  Comment - - - Not sure -  Risk Factor Category  - - - - -  Risk for fall due to : Impaired mobility;Impaired  balance/gait - - - -     Allergies  Allergen Reactions  . Atorvastatin     REACTION: myalgia  . Penicillins Hives and Itching    Has patient had a PCN reaction causing immediate rash, facial/tongue/throat swelling, SOB or lightheadedness with hypotension: no Has patient had a PCN reaction causing severe rash involving mucus membranes or skin necrosis: no Has patient had a PCN reaction that required hospitalization : no Has patient had a PCN reaction occurring within the last 10 years: no If all of the above answers are "NO", then may proceed with Cephalosporin use.     Prior to Admission medications   Medication Sig Start Date End Date Taking? Authorizing Provider  acetaminophen (TYLENOL) 325 MG tablet Take 650 mg by mouth every 6 (six) hours as needed for mild pain or headache.   Yes [provider]  Ascorbic Acid (VITAMIN C PO) Take 1 tablet by mouth daily.   Yes [provider]  clopidogrel (PLAVIX) 75 MG tablet Take 1 tablet (75 mg total) by mouth daily. 01/21/19  Yes Stallings, Zoe A, MD  Coenzyme Q10 10 MG capsule Take 200 mg by mouth daily.   Yes [provider]  Cyanocobalamin (VITAMIN B-12 PO) Take 1 tablet by mouth daily.   Yes [provider]  fluticasone (FLONASE) 50 MCG/ACT nasal spray Place 1 spray into both nostrils 2 (two) times daily. 04/26/19  Yes [provider]  loratadine (CLARITIN) 10 MG tablet TAKE 1 TABLET BY MOUTH EVERY DAY 12/15/18  Yes Dorothyann Peng, MD  Multiple Vitamins-Minerals (MULTIVITAMIN WITH MINERALS) tablet Take 1 tablet by mouth daily.   Yes [provider]  nortriptyline (PAMELOR) 10 MG capsule TAKE 1 CAPSULE (10 MG TOTAL) BY MOUTH AT BEDTIME. 02/13/19  Yes Stallings, Zoe A, MD  Omega-3 Fatty Acids (FISH OIL) 1000 MG CAPS Take 1,000 mg by mouth daily.    Yes [provider]  polyethylene glycol powder (GLYCOLAX/MIRALAX) powder Take 17 g by mouth daily. 04/15/18  Yes Sagardia, Eilleen Kempf, MD   rosuvastatin (CRESTOR) 40 MG tablet Take 1 tablet (40 mg total) by mouth daily. 01/21/19  Yes Doristine Bosworth, MD    Past Medical History:  Diagnosis Date  . Acute sinus infection 01/15/2011  . ANXIETY 10/03/2009  . BARRETTS ESOPHAGUS 10/03/2009  . CAROTID ARTERY DISEASE 08/31/2009  . COLONIC POLYPS, HX OF 08/29/2009  . CONSTIPATION, CHRONIC 10/03/2009  . DEPRESSION 10/03/2009  . DISC DISEASE, CERVICAL 08/29/2009  . DISC DISEASE, LUMBAR 08/29/2009  . Eustachian tube dysfunction 01/15/2011  . GERD 08/29/2009  . HYPERLIPIDEMIA 08/29/2009  . Impaired glucose tolerance 01/14/2011  . LEG PAIN, BILATERAL 08/29/2009  . LOW BACK PAIN, CHRONIC 11/22/2009  . PERIPHERAL NEUROPATHY 10/03/2009  . PERIPHERAL VASCULAR DISEASE 08/29/2009  . POSITIVE PPD 10/03/2009  . Preventative health care 01/14/2011  . Stroke (HCC)   . Thyroid nodule 08/09/2011  . TIA 11/22/2009  . TRANSIENT ISCHEMIC ATTACK, HX OF 08/29/2009  . URETHRAL STRICTURE 10/03/2009  . VOCAL CORD POLYP, HX OF 10/03/2009  . WRIST PAIN, BILATERAL 05/25/2010    Past Surgical History:  Procedure Laterality Date  . hx of right ankle fracture    . s/p vocal cord polyps  1984   chronic hoarseness  . stress test negative  2003,2004,2006  . TUBAL LIGATION    . tubaligation    . uterine prolapse      Social History   Tobacco Use  . Smoking status: Never Smoker  . Smokeless tobacco: Never Used  Substance Use Topics  . Alcohol use: No    Family History  Problem Relation Age of Onset  . Stroke Mother   . Heart disease Mother   . Hypertension Mother   . Diabetes Mother   . Multiple myeloma Sister   . Cancer Other        lung    ROS Per hpi  OBJECTIVE:  Today's Vitals   05/19/19 1655  BP: 128/79  Pulse: 73  Temp: 98.3 F (36.8 C)  SpO2: 95%  Weight: 159 lb (72.1 kg)  Height: 5\' 6"  (1.676 m)   Body mass index is 25.66 kg/m.   Physical Exam Vitals signs and nursing note reviewed.  Constitutional:      Appearance: She is  well-developed.  HENT:     Head: Normocephalic and atraumatic.     Mouth/Throat:     Pharynx: No oropharyngeal exudate.  Eyes:     General: No scleral icterus.    Conjunctiva/sclera: Conjunctivae normal.     Pupils: Pupils are equal, round, and reactive to light.  Neck:     Musculoskeletal: Neck supple.  Cardiovascular:     Rate and Rhythm: Normal rate and regular rhythm.  Heart sounds: Normal heart sounds. No murmur. No friction rub. No gallop.   Pulmonary:     Effort: Pulmonary effort is normal.     Breath sounds: Normal breath sounds. No wheezing or rales.  Musculoskeletal:     Right knee: Normal.     Left knee: She exhibits swelling. She exhibits normal range of motion, no effusion, no ecchymosis, no deformity, no erythema, no LCL laxity, normal patellar mobility, normal meniscus and no MCL laxity. No tenderness found.     Lumbar back: She exhibits normal range of motion, no tenderness, no bony tenderness and no spasm.     Right lower leg: No edema.     Left lower leg: No edema.  Skin:    General: Skin is warm and dry.  Neurological:     Mental Status: She is alert and oriented to person, place, and time.     No results found for this or any previous visit (from the past 24 hour(s)).  Dg Lumbar Spine 2-3 Views  Result Date: 05/19/2019 CLINICAL DATA:  Pain secondary to a fall 3 days ago. EXAM: LUMBAR SPINE - 2-3 VIEW COMPARISON:  Radiographs dated 06/02/2012 and lumbar MRI dated 12/20/2017 FINDINGS: There is no fracture or bone destruction. Lateral alignment is normal. No significant disc space narrowing. Degenerative facet arthritis in the lower lumbar spine. IMPRESSION: No acute abnormality. Degenerative facet arthritis in the lower lumbar spine. Electronically Signed   By: Francene Boyers M.D.   On: 05/19/2019 17:29   Dg Knee Complete 4 Views Left  Result Date: 05/19/2019 CLINICAL DATA:  Left knee pain since a fall 3 days ago. EXAM: LEFT KNEE - COMPLETE 4+ VIEW  COMPARISON:  Radiograph dated 11/25/2014 FINDINGS: No evidence of fracture, dislocation, or joint effusion. There are slight arthritic changes in all 3 compartments. Small loose body in the joint which is new since the prior study. Soft tissues are unremarkable. IMPRESSION: 1. No acute abnormality. 2. Tricompartmental arthritis. 3. Small loose body in the joint which is new since the prior study. Electronically Signed   By: Francene Boyers M.D.   On: 05/19/2019 17:31     ASSESSMENT and PLAN  1. Acute midline low back pain without sciatica Exam and xray nonfocal. Discussed supportive measures.  - DG Lumbar Spine 2-3 Views; Future  2. Acute pain of left knee Discussed RICE therapy. Xray shows loose body in joint. Referring to ortho for further eval and treatment. - DG Knee Complete 4 Views Left; Future - Ambulatory referral to Orthopedic Surgery  3. Recurrent falls Stop nortriptyline  Other orders - diclofenac sodium (VOLTAREN) 1 % GEL; Apply 4 g topically 4 (four) times daily.  Return for already scheduled with Dr Creta Levin for Oct 26th - please keep appt.    Myles Lipps, MD Primary Care at [facility] [address] [place], [place] [address] Ph.  [phone] Fax [phone]

## 2019-05-19 NOTE — Patient Instructions (Addendum)
   Apply a compressive ACE bandage. Rest and elevate the affected painful area.  Apply cold compresses intermittently as needed.  As pain recedes, begin normal activities slowly as tolerated.    If you have lab work done today you will be contacted with your lab results within the next 2 weeks.  If you have not heard from [place] then please contact us. The fastest way to get your results is to register for My Chart.   IF you received an x-ray today, you will receive an invoice from [facility]. Please contact [facility] at [phone] with questions or concerns regarding your invoice.   IF you received labwork today, you will receive an invoice from [facility]. Please contact [facility] at [phone] with questions or concerns regarding your invoice.   Our billing staff will not be able to assist you with questions regarding bills from these companies.  You will be contacted with the lab results as soon as they are available. The fastest way to get your results is to activate your My Chart account. Instructions are located on the last page of this paperwork. If you have not heard from [place] regarding the results in 2 weeks, please contact this office.

## 2019-05-25 ENCOUNTER — Other Ambulatory Visit: Payer: Self-pay

## 2019-05-25 ENCOUNTER — Ambulatory Visit (INDEPENDENT_AMBULATORY_CARE_PROVIDER_SITE_OTHER): Payer: PPO | Admitting: Family Medicine

## 2019-05-25 ENCOUNTER — Encounter: Payer: Self-pay | Admitting: Family Medicine

## 2019-05-25 VITALS — BP 155/75 | HR 73 | Ht 66.0 in | Wt 158.0 lb

## 2019-05-25 DIAGNOSIS — W19XXXD Unspecified fall, subsequent encounter: Secondary | ICD-10-CM

## 2019-05-25 DIAGNOSIS — R42 Dizziness and giddiness: Secondary | ICD-10-CM | POA: Diagnosis not present

## 2019-05-25 DIAGNOSIS — K5909 Other constipation: Secondary | ICD-10-CM | POA: Diagnosis not present

## 2019-05-25 DIAGNOSIS — Y92009 Unspecified place in unspecified non-institutional (private) residence as the place of occurrence of the external cause: Secondary | ICD-10-CM | POA: Diagnosis not present

## 2019-05-25 DIAGNOSIS — R030 Elevated blood-pressure reading, without diagnosis of hypertension: Secondary | ICD-10-CM

## 2019-05-25 DIAGNOSIS — M25562 Pain in left knee: Secondary | ICD-10-CM | POA: Diagnosis not present

## 2019-05-25 NOTE — Patient Instructions (Addendum)
[name] [phone]      If you have lab work done today you will be contacted with your lab results within the next 2 weeks.  If you have not heard from [place] then please contact us. The fastest way to get your results is to register for My Chart.   IF you received an x-ray today, you will receive an invoice from [facility]. Please contact [facility] at [phone] with questions or concerns regarding your invoice.   IF you received labwork today, you will receive an invoice from [facility]. Please contact [facility] at [phone] with questions or concerns regarding your invoice.   Our billing staff will not be able to assist you with questions regarding bills from these companies.  You will be contacted with the lab results as soon as they are available. The fastest way to get your results is to activate your My Chart account. Instructions are located on the last page of this paperwork. If you have not heard from [place] regarding the results in 2 weeks, please contact this office.

## 2019-05-25 NOTE — Progress Notes (Signed)
Established Patient Office Visit  Subjective:  Patient ID: [name], female    DOB: 01-18-1941  Age: 78 y.o. MRN: [id number]  CC:  Chief Complaint  Patient presents with   Constipation    Pt stated having to take laxative everytime using the bathroom.    HPI [name] presents for   Elevated BP Patient reports that she is dealing with a lot and that might be why her bp is elevated Patient reports that her best friend died after 25 years of friendship BP Readings from Last 3 Encounters:  05/25/19 (!) 155/75  05/19/19 128/79  05/05/19 (!) 151/78    Fall at home Knee pain-left She reports that she fell at home on 05/04/2019 and again she fell on 05/05/2019 She reports that she hurt her head with the initial fall and had a hematoma on the right frontal area. She fell the next day on 05/05/2019 and hurt her left knee and back Now she has knee pain, back pain and dizziness She reports that she thinks she might be sleep walking because where she falls is too far from the bed for her to have rolled out of bed. She was taking Nortriptyline because it did not help her sleep.   She has a history of carotid artery disease and atherosclerosis with history of TIA and CVA She reports that she has an appointment with Dr. Jacinto Halim, her cardiologist. She is compliant with her meds and is taking her Plavix and Crestor.  She went to the ED the next day and she had xrays done here at [place]   Constipation She reports that she has been having difficulty with BM and has chronic constipation requiring Miralax.  She also drinks plenty of water  This is not new for her and without the Miralax she cannot have a normal BM No hematochezia, no melena     Past Medical History:  Diagnosis Date   Acute sinus infection 01/15/2011   ANXIETY 10/03/2009   BARRETTS ESOPHAGUS 10/03/2009   CAROTID ARTERY DISEASE 08/31/2009   COLONIC POLYPS, HX OF 08/29/2009   CONSTIPATION, CHRONIC 10/03/2009    DEPRESSION 10/03/2009   DISC DISEASE, CERVICAL 08/29/2009   DISC DISEASE, LUMBAR 08/29/2009   Eustachian tube dysfunction 01/15/2011   GERD 08/29/2009   HYPERLIPIDEMIA 08/29/2009   Impaired glucose tolerance 01/14/2011   LEG PAIN, BILATERAL 08/29/2009   LOW BACK PAIN, CHRONIC 11/22/2009   PERIPHERAL NEUROPATHY 10/03/2009   PERIPHERAL VASCULAR DISEASE 08/29/2009   POSITIVE PPD 10/03/2009   Preventative health care 01/14/2011   Stroke (HCC)    Thyroid nodule 08/09/2011   TIA 11/22/2009   TRANSIENT ISCHEMIC ATTACK, HX OF 08/29/2009   URETHRAL STRICTURE 10/03/2009   VOCAL CORD POLYP, HX OF 10/03/2009   WRIST PAIN, BILATERAL 05/25/2010    Past Surgical History:  Procedure Laterality Date   hx of right ankle fracture     s/p vocal cord polyps  1984   chronic hoarseness   stress test negative  2003,2004,2006   TUBAL LIGATION     tubaligation     uterine prolapse      Family History  Problem Relation Age of Onset   Stroke Mother    Heart disease Mother    Hypertension Mother    Diabetes Mother    Multiple myeloma Sister    Cancer Other        lung    Social History   Socioeconomic History   Marital status: Widowed  Spouse name: Not on file   Number of children: 3   Years of education: Not on file   Highest education level: Not on file  Occupational History   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  Social Network engineer strain: Not on file   Food insecurity    Worry: Not on file    Inability: Not on file   Transportation needs    Medical: Not on file    Non-medical: Not on file  Tobacco Use   Smoking status: Never Smoker   Smokeless tobacco: Never Used  Substance and Sexual Activity   Alcohol use: No   Drug use: No   Sexual activity: Not Currently  Lifestyle   Physical activity    Days per week: Not on file    Minutes per session: Not on file   Stress: Not on file  Relationships   Social  connections    Talks on phone: Not on file    Gets together: Not on file    Attends religious service: Not on file    Active member of club or organization: Not on file    Attends meetings of clubs or organizations: Not on file    Relationship status: Not on file   Intimate partner violence    Fear of current or ex partner: Not on file    Emotionally abused: Not on file    Physically abused: Not on file    Forced sexual activity: Not on file  Other Topics Concern   Not on file  Social History Narrative   Husband with dementia    Outpatient Medications Prior to Visit  Medication Sig Dispense Refill   acetaminophen (TYLENOL) 325 MG tablet Take 650 mg by mouth every 6 (six) hours as needed for mild pain or headache.     Ascorbic Acid (VITAMIN C PO) Take 1 tablet by mouth daily.     clopidogrel (PLAVIX) 75 MG tablet Take 1 tablet (75 mg total) by mouth daily. 90 tablet 3   Coenzyme Q10 10 MG capsule Take 200 mg by mouth daily.     Cyanocobalamin (VITAMIN B-12 PO) Take 1 tablet by mouth daily.     diclofenac sodium (VOLTAREN) 1 % GEL Apply 4 g topically 4 (four) times daily. 100 g 0   fluticasone (FLONASE) 50 MCG/ACT nasal spray Place 1 spray into both nostrils 2 (two) times daily.     loratadine (CLARITIN) 10 MG tablet TAKE 1 TABLET BY MOUTH EVERY DAY 30 tablet 1   Multiple Vitamins-Minerals (MULTIVITAMIN WITH MINERALS) tablet Take 1 tablet by mouth daily.     Omega-3 Fatty Acids (FISH OIL) 1000 MG CAPS Take 1,000 mg by mouth daily.      polyethylene glycol powder (GLYCOLAX/MIRALAX) powder Take 17 g by mouth daily. 3350 g 1   rosuvastatin (CRESTOR) 40 MG tablet Take 1 tablet (40 mg total) by mouth daily. 90 tablet 3   Facility-Administered Medications Prior to Visit  Medication Dose Route Frequency Provider Last Rate Last Dose   aspirin chewable tablet 324 mg  324 mg Oral Once Collie Siad A, MD        Allergies  Allergen Reactions   Atorvastatin     REACTION:  myalgia   Penicillins Hives and Itching    Has patient had a PCN reaction causing immediate rash, facial/tongue/throat swelling, SOB or lightheadedness with hypotension: no Has patient had a PCN reaction causing severe rash involving mucus membranes or  skin necrosis: no Has patient had a PCN reaction that required hospitalization : no Has patient had a PCN reaction occurring within the last 10 years: no If all of the above answers are "NO", then may proceed with Cephalosporin use.     ROS Review of Systems Review of Systems  Constitutional: Negative for activity change, appetite change, chills and fever.  HENT: Negative for congestion, nosebleeds, trouble swallowing and voice change.   Respiratory: Negative for cough, shortness of breath and wheezing.   Gastrointestinal: Negative for diarrhea, nausea and vomiting.  Genitourinary: Negative for difficulty urinating, dysuria, flank pain and hematuria.  Musculoskeletal: see hpi Neurological: see hpi See HPI. All other review of systems negative.     Objective:    Physical Exam  BP (!) 155/75 (BP Location: Right Arm, Patient Position: Sitting, Cuff Size: Normal)    Pulse 73    Ht 5\' 6"  (1.676 m)    Wt 158 lb (71.7 kg)    SpO2 99%    BMI 25.50 kg/m  Wt Readings from Last 3 Encounters:  05/25/19 158 lb (71.7 kg)  05/19/19 159 lb (72.1 kg)  05/05/19 167 lb (75.8 kg)   Physical Exam  Constitutional: Oriented to person, place, and time. Appears well-developed and well-nourished.  HENT:  Head: Normocephalic and atraumatic.  Eyes: Conjunctivae and EOM are normal.  Cardiovascular: Normal rate, regular rhythm, normal heart sounds and intact distal pulses.  No murmur heard. Pulmonary/Chest: Effort normal and breath sounds normal. No stridor. No respiratory distress. Has no wheezes.  Neurological: Is alert and oriented to person, place, and time.  Skin: Skin is warm. Capillary refill takes less than 2 seconds.  Psychiatric: Has a normal  mood and affect. Behavior is normal. Judgment and thought content normal.     Health Maintenance Due  Topic Date Due   DEXA SCAN  07/13/2004   PNA vac Low Risk Adult (2 of 2 - PCV13) 07/30/2008    There are no preventive care reminders to display for this patient.  Lab Results  Component Value Date   TSH 0.72 03/12/2018   Lab Results  Component Value Date   WBC 4.3 01/21/2019   HGB 15.5 01/21/2019   HCT 45.7 01/21/2019   MCV 92 01/21/2019   PLT 157 01/21/2019   Lab Results  Component Value Date   NA 145 (H) 01/21/2019   K 4.2 01/21/2019   CO2 23 01/21/2019   GLUCOSE 96 01/21/2019   BUN 10 01/21/2019   CREATININE 1.03 (H) 01/21/2019   BILITOT 0.6 01/21/2019   ALKPHOS 74 01/21/2019   AST 29 01/21/2019   ALT 21 01/21/2019   PROT 6.7 01/21/2019   ALBUMIN 4.3 01/21/2019   CALCIUM 9.8 01/21/2019   ANIONGAP 8 01/08/2017   GFR 86.95 05/14/2011   Lab Results  Component Value Date   CHOL 242 (H) 01/21/2019   Lab Results  Component Value Date   HDL 40 01/21/2019   Lab Results  Component Value Date   LDLCALC 144 (H) 01/21/2019   Lab Results  Component Value Date   TRIG 291 (H) 01/21/2019   Lab Results  Component Value Date   CHOLHDL 6.1 (H) 01/21/2019   Lab Results  Component Value Date   HGBA1C 5.9 03/12/2018      Assessment & Plan:   Problem List Items Addressed This Visit      Other   CONSTIPATION, CHRONIC   Dizziness    Other Visit Diagnoses    Fall in home,  subsequent encounter    -  Primary   Acute pain of left knee       Elevated blood pressure reading         Advised pt to follow up with Orthopedics and to anticipate PT for her left knee pain Would not do any narcotics due to fall risks Advised tylenol arthritis  Will follow up with Cardiology to see if this is some kind of orthostatic hypotension No orders of the defined types were placed in this encounter.   Follow-up: No follow-ups on file.    Doristine Bosworth, MD

## 2019-06-02 ENCOUNTER — Encounter: Payer: Self-pay | Admitting: Orthopaedic Surgery

## 2019-06-02 ENCOUNTER — Ambulatory Visit: Payer: PPO | Admitting: Orthopaedic Surgery

## 2019-06-02 ENCOUNTER — Other Ambulatory Visit: Payer: Self-pay

## 2019-06-02 VITALS — Ht 66.0 in | Wt 158.0 lb

## 2019-06-02 DIAGNOSIS — G8929 Other chronic pain: Secondary | ICD-10-CM | POA: Diagnosis not present

## 2019-06-02 DIAGNOSIS — M25562 Pain in left knee: Secondary | ICD-10-CM

## 2019-06-02 DIAGNOSIS — M545 Low back pain, unspecified: Secondary | ICD-10-CM

## 2019-06-02 MED ORDER — PREDNISONE 5 MG (21) PO TBPK: ORAL_TABLET | ORAL | 0 refills | Status: DC

## 2019-06-02 MED ORDER — METHOCARBAMOL 500 MG PO TABS: 500.0000 mg | ORAL_TABLET | Freq: Two times a day (BID) | ORAL | 0 refills | Status: DC | PRN

## 2019-06-02 NOTE — Progress Notes (Signed)
Office Visit Note   Patient: [name]           Date of Birth: 01-04-1941           MRN: [id number] Visit Date: 06/02/2019              Requested by: Myles Lipps, MD [address] [place],  [place] [address] PCP: Doristine Bosworth, MD   Assessment & Plan: Visit Diagnoses:  1. Chronic pain of left knee   2. Chronic left-sided low back pain, unspecified whether sciatica present     Plan: Impression is lower back pain with possible referral to the left knee.  At this point I do not believe the questionable loose body is symptomatic so we will leave this alone for now.  We will start her on a steroid taper, muscle relaxer and start her in physical therapy for her back.  She will follow-up with [place] as needed.  Follow-Up Instructions: Return if symptoms worsen or fail to improve.   Orders:  Orders Placed This Encounter  Procedures  . Ambulatory referral to Physical Therapy   Meds ordered this encounter  Medications  . predniSONE (STERAPRED UNI-PAK 21 TAB) 5 MG (21) TBPK tablet    Sig: Take as directed    Dispense:  21 tablet    Refill:  0  . methocarbamol (ROBAXIN) 500 MG tablet    Sig: Take 1 tablet (500 mg total) by mouth 2 (two) times daily as needed.    Dispense:  20 tablet    Refill:  0      Procedures: No procedures performed   Clinical Data: No additional findings.   Subjective: Chief Complaint  Patient presents with  . Lower Back - Pain  . Left Knee - Pain    HPI patient is a pleasant 78 year old female who presents our clinic today with midline to left-sided lower back pain and left knee pain.  This began after sustaining 2 mechanical falls approximately 1 month ago.  She was seen at [facility] urgent care x-rays of her lumbar spine and left knee were obtained.  No acute findings to the lumbar spine.  She did have a questionable loose body to the left knee with underlying tricompartmental degenerative changes.  The pain she has is described as an  intermittent pain worse with ambulating.  She notes occasional swelling to the left knee.  She denies any locking or catching to the knee.  She has tried Tylenol without relief of symptoms.  No numbness, tingling or burning.  No bowel or bladder change.  No saddle paresthesias.  She has a history of L-spine ESI approximately 15 years ago.  Doing well until recently.  Review of Systems as detailed in HPI.  All others reviewed and are negative.   Objective: Vital Signs: Ht 5\' 6"  (1.676 m)   Wt 158 lb (71.7 kg)   BMI 25.50 kg/m   Physical Exam well-developed well-nourished female no acute distress.  Alert and oriented x3.  Ortho Exam examination of the lumbar spine reveals no spinous tenderness.  No paraspinous tenderness.  No pain with lumbar flexion, extension or rotation.  Negative straight leg raise negative logroll.  Left knee exam shows range of motion from 0 to 100 degrees.  No joint line tenderness.  Ligaments are stable.  She is neurovascular intact distally.  Specialty Comments:  No specialty comments available.  Imaging: No new imaging   PMFS History: Patient Active Problem List   Diagnosis Date  Noted  . Dizziness 08/14/2017  . Acute left hemiparesis (HCC)   . TIA (transient ischemic attack) 06/01/2016  . CVA (cerebral vascular accident) (HCC) 06/01/2016  . Transient cerebral ischemia   . Complicated migraine 08/22/2014  . Multinodular goiter 08/09/2011  . Lumbar pain with radiation down right leg 04/11/2011  . Rash 04/11/2011  . Impaired glucose tolerance 01/14/2011  . Preventative health care 01/14/2011  . WRIST PAIN, BILATERAL 05/25/2010  . TIA 11/22/2009  . LOW BACK PAIN, CHRONIC 11/22/2009  . ANXIETY 10/03/2009  . DEPRESSION 10/03/2009  . PERIPHERAL NEUROPATHY 10/03/2009  . BARRETTS ESOPHAGUS 10/03/2009  . CONSTIPATION, CHRONIC 10/03/2009  . URETHRAL STRICTURE 10/03/2009  . POSITIVE PPD 10/03/2009  . VOCAL CORD POLYP, HX OF 10/03/2009  . Carotid artery  disease (HCC) 08/31/2009  . Hyperlipemia 08/29/2009  . PERIPHERAL VASCULAR DISEASE 08/29/2009  . GERD 08/29/2009  . DISC DISEASE, CERVICAL 08/29/2009  . DISC DISEASE, LUMBAR 08/29/2009  . LEG PAIN, BILATERAL 08/29/2009  . TRANSIENT ISCHEMIC ATTACK, HX OF 08/29/2009  . COLONIC POLYPS, HX OF 08/29/2009   Past Medical History:  Diagnosis Date  . Acute sinus infection 01/15/2011  . ANXIETY 10/03/2009  . BARRETTS ESOPHAGUS 10/03/2009  . CAROTID ARTERY DISEASE 08/31/2009  . COLONIC POLYPS, HX OF 08/29/2009  . CONSTIPATION, CHRONIC 10/03/2009  . DEPRESSION 10/03/2009  . DISC DISEASE, CERVICAL 08/29/2009  . DISC DISEASE, LUMBAR 08/29/2009  . Eustachian tube dysfunction 01/15/2011  . GERD 08/29/2009  . HYPERLIPIDEMIA 08/29/2009  . Impaired glucose tolerance 01/14/2011  . LEG PAIN, BILATERAL 08/29/2009  . LOW BACK PAIN, CHRONIC 11/22/2009  . PERIPHERAL NEUROPATHY 10/03/2009  . PERIPHERAL VASCULAR DISEASE 08/29/2009  . POSITIVE PPD 10/03/2009  . Preventative health care 01/14/2011  . Stroke (HCC)   . Thyroid nodule 08/09/2011  . TIA 11/22/2009  . TRANSIENT ISCHEMIC ATTACK, HX OF 08/29/2009  . URETHRAL STRICTURE 10/03/2009  . VOCAL CORD POLYP, HX OF 10/03/2009  . WRIST PAIN, BILATERAL 05/25/2010    Family History  Problem Relation Age of Onset  . Stroke Mother   . Heart disease Mother   . Hypertension Mother   . Diabetes Mother   . Multiple myeloma Sister   . Cancer Other        lung    Past Surgical History:  Procedure Laterality Date  . hx of right ankle fracture    . s/p vocal cord polyps  1984   chronic hoarseness  . stress test negative  2003,2004,2006  . TUBAL LIGATION    . tubaligation    . uterine prolapse     Social History   Occupational History  . 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 Smoking status: Never Smoker  . Smokeless tobacco: Never Used  Substance and Sexual Activity  . Alcohol use: No  . Drug use: No  . Sexual activity: Not  Currently

## 2019-06-05 ENCOUNTER — Ambulatory Visit (INDEPENDENT_AMBULATORY_CARE_PROVIDER_SITE_OTHER): Payer: PPO | Admitting: Cardiology

## 2019-06-05 ENCOUNTER — Encounter: Payer: Self-pay | Admitting: Cardiology

## 2019-06-05 ENCOUNTER — Other Ambulatory Visit: Payer: Self-pay

## 2019-06-05 VITALS — BP 136/83 | HR 76 | Temp 99.2°F | Ht 66.0 in | Wt 159.2 lb

## 2019-06-05 DIAGNOSIS — I6523 Occlusion and stenosis of bilateral carotid arteries: Secondary | ICD-10-CM | POA: Diagnosis not present

## 2019-06-05 DIAGNOSIS — E7800 Pure hypercholesterolemia, unspecified: Secondary | ICD-10-CM

## 2019-06-05 DIAGNOSIS — I10 Essential (primary) hypertension: Secondary | ICD-10-CM | POA: Diagnosis not present

## 2019-06-05 NOTE — Progress Notes (Signed)
Primary Physician/Referring:  Doristine Bosworth, MD  Patient ID: [name], female    DOB: 1941-06-16, 78 y.o.   MRN: [id number]  Chief Complaint  Patient presents with  . Coronary Artery Disease  . Follow-up    1 year and cartoid results    HPI:    HPI: [name]  is a 78 y.o. with h/o hyperlipidemia, stroke on 06/03/2016 with left hemiparesis. She has very mild residual deficits with speach and most of her leg weakness has now resolved.  She is presently doing well and denies chest pain, dizziness or syncope.  She has had two accidental fall and one of them during sleep.  Past Medical History:  Diagnosis Date  . Acute sinus infection 01/15/2011  . ANXIETY 10/03/2009  . BARRETTS ESOPHAGUS 10/03/2009  . CAROTID ARTERY DISEASE 08/31/2009  . COLONIC POLYPS, HX OF 08/29/2009  . CONSTIPATION, CHRONIC 10/03/2009  . DEPRESSION 10/03/2009  . DISC DISEASE, CERVICAL 08/29/2009  . DISC DISEASE, LUMBAR 08/29/2009  . Eustachian tube dysfunction 01/15/2011  . GERD 08/29/2009  . HYPERLIPIDEMIA 08/29/2009  . Impaired glucose tolerance 01/14/2011  . LEG PAIN, BILATERAL 08/29/2009  . LOW BACK PAIN, CHRONIC 11/22/2009  . PERIPHERAL NEUROPATHY 10/03/2009  . PERIPHERAL VASCULAR DISEASE 08/29/2009  . POSITIVE PPD 10/03/2009  . Preventative health care 01/14/2011  . Stroke (HCC)   . Thyroid nodule 08/09/2011  . TIA 11/22/2009  . TRANSIENT ISCHEMIC ATTACK, HX OF 08/29/2009  . URETHRAL STRICTURE 10/03/2009  . VOCAL CORD POLYP, HX OF 10/03/2009  . WRIST PAIN, BILATERAL 05/25/2010   Past Surgical History:  Procedure Laterality Date  . hx of right ankle fracture    . s/p vocal cord polyps  1984   chronic hoarseness  . stress test negative  2003,2004,2006  . TUBAL LIGATION    . tubaligation    . uterine prolapse     Social History   Socioeconomic History  . Marital status: Widowed    Spouse name: Not on file  . Number of children: 3  . Years of education: Not on file  . Highest education level: Not on  file  Occupational History  . 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 Financial resource strain: Not on file  . Food insecurity    Worry: Not on file    Inability: Not on file  . Transportation needs    Medical: Not on file    Non-medical: Not on file  Tobacco Use  . Smoking status: Never Smoker  . Smokeless tobacco: Never Used  Substance and Sexual Activity  . Alcohol use: No  . Drug use: No  . Sexual activity: Not Currently  Lifestyle  . Physical activity    Days per week: Not on file    Minutes per session: Not on file  . Stress: Not on file  Relationships  . Social Musician on phone: Not on file    Gets together: Not on file    Attends religious service: Not on file    Active member of club or organization: Not on file    Attends meetings of clubs or organizations: Not on file    Relationship status: Not on file  . Intimate partner violence    Fear of current or ex partner: Not on file    Emotionally abused: Not on file    Physically abused: Not on file    Forced sexual activity: Not on  file  Other Topics Concern  . Not on file  Social History Narrative   Husband with dementia   ROS  Review of Systems  Constitution: Negative for decreased appetite, malaise/fatigue, weight gain and weight loss.  Eyes: Negative for visual disturbance.  Cardiovascular: Negative for chest pain, claudication, dyspnea on exertion, leg swelling, orthopnea, palpitations and syncope.  Respiratory: Negative for hemoptysis and wheezing.   Endocrine: Negative for cold intolerance and heat intolerance.  Hematologic/Lymphatic: Does not bruise/bleed easily.  Skin: Negative for nail changes.  Musculoskeletal: Positive for joint pain (left knee). Negative for back pain, muscle weakness and myalgias.  Gastrointestinal: Negative for abdominal pain, change in bowel habit, nausea and vomiting.  Neurological: Negative for difficulty with  concentration, dizziness, focal weakness and headaches.  Psychiatric/Behavioral: Negative for altered mental status and suicidal ideas.  All other systems reviewed and are negative.  Objective  Blood pressure 136/83, pulse 76, temperature 99.2 F (37.3 C), height 5\' 6"  (1.676 m), weight 159 lb 3.2 oz (72.2 kg), SpO2 97 %. Body mass index is 25.7 kg/m.   Physical Exam  Constitutional: She is oriented to person, place, and time. Vital signs are normal.  She is moderately breath and well-nourished in no acute distress.  HENT:  Head: Normocephalic and atraumatic.  Neck: Normal range of motion.  Cardiovascular: Normal rate, regular rhythm, normal heart sounds and intact distal pulses.  Pulses:      Popliteal pulses are 1+ on the right side and 1+ on the left side.       Dorsalis pedis pulses are 1+ on the right side and 1+ on the left side.       Posterior tibial pulses are 1+ on the right side and 1+ on the left side.  Pulmonary/Chest: Effort normal and breath sounds normal. No accessory muscle usage. No respiratory distress.  Abdominal: Soft. Bowel sounds are normal.  Musculoskeletal: Normal range of motion.  Neurological: She is alert and oriented to person, place, and time.  Skin: Skin is warm and dry.   Radiology: No results found.  Laboratory examination:   Recent Labs    06/16/18 1235 01/21/19 1118  NA 144 145*  K 4.1 4.2  CL 103 105  CO2 24 23  GLUCOSE 87 96  BUN 7* 10  CREATININE 0.89 1.03*  CALCIUM 9.8 9.8  GFRNONAA 62 52*  GFRAA 72 60   CMP Latest Ref Rng & Units 01/21/2019 06/16/2018 03/12/2018  Glucose 65 - 99 mg/dL 96 87 -  BUN 8 - 27 mg/dL 10 7(L) 8  Creatinine 1.61 - 1.00 mg/dL 0.96(E) 4.54 0.9  Sodium 134 - 144 mmol/L 145(H) 144 -  Potassium 3.5 - 5.2 mmol/L 4.2 4.1 -  Chloride 96 - 106 mmol/L 105 103 -  CO2 20 - 29 mmol/L 23 24 -  Calcium 8.7 - 10.3 mg/dL 9.8 9.8 -  Total Protein 6.0 - 8.5 g/dL 6.7 6.8 -  Total Bilirubin 0.0 - 1.2 mg/dL 0.6 0.5 -   Alkaline Phos 39 - 117 IU/L 74 75 -  AST 0 - 40 IU/L 29 38 -  ALT 0 - 32 IU/L 21 33(H) -   CBC Latest Ref Rng & Units 01/21/2019 07/18/2017 01/08/2017  WBC 3.4 - 10.8 x10E3/uL 4.3 4.1(A) 3.6(L)  Hemoglobin 11.1 - 15.9 g/dL 09.8 11.9 15.5(H)  Hematocrit 34.0 - 46.6 % 45.7 46.0 47.5(H)  Platelets 150 - 450 x10E3/uL 157 - 137(L)   Lipid Panel     Component Value Date/Time  CHOL 242 (H) 01/21/2019 1118   TRIG 291 (H) 01/21/2019 1118   HDL 40 01/21/2019 1118   CHOLHDL 6.1 (H) 01/21/2019 1118   CHOLHDL 5.6 08/22/2014 0409   VLDL 17 08/22/2014 0409   LDLCALC 144 (H) 01/21/2019 1118   LDLDIRECT 194.5 05/25/2010 1139   HEMOGLOBIN A1C Lab Results  Component Value Date   HGBA1C 5.9 03/12/2018   MPG 123 (H) 08/22/2014   TSH No results for input(s): TSH in the last 8760 hours. Medications   Current Outpatient Medications  Medication Instructions  . acetaminophen (TYLENOL) 650 mg, Oral, Every 6 hours PRN  . Ascorbic Acid (VITAMIN C PO) 1 tablet, Oral, Daily  . clopidogrel (PLAVIX) 75 mg, Oral, Daily  . Coenzyme Q10 200 mg, Oral, Daily  . Cyanocobalamin (VITAMIN B-12 PO) 1 tablet, Oral, Daily  . Fish Oil 1,000 mg, Oral, Daily  . fluticasone (FLONASE) 50 MCG/ACT nasal spray 1 spray, Each Nare, 2 times daily  . Multiple Vitamins-Minerals (MULTIVITAMIN WITH MINERALS) tablet 1 tablet, Oral, Daily  . polyethylene glycol powder (GLYCOLAX/MIRALAX) 17 g, Oral, Daily  . predniSONE (STERAPRED UNI-PAK 21 TAB) 5 MG (21) TBPK tablet Take as directed  . rosuvastatin (CRESTOR) 40 mg, Oral, Daily    Cardiac Studies:   Carotid artery duplex 2018-05-24: Stenosis in the right internal carotid artery (16-49%). Stenosis in the left internal carotid artery (16-49%). Antegrade right vertebral artery flow. Antegrade left vertebral artery flow. Compared to 10/22/2017, left ICA stenosis was >50%. Follow up in one year is appropriate if clinically indicated.  Lower Extremity Dopplers [11/27/2016]: No  hemodynamically significant stenoses are identified in the lower extremity arterial system. This exam reveals normal perfusion of the lower extremity. Bilateral ABI 1.0. Biphasic waveform in the anterior tibial vessel suggests mild diffuse disease.  Echocardiogram [06/03/2016]: Hospital: Normal LV systolic function, EF 60-65%. Pseudo-normal left ventricle her filling pattern. No significant valvular abnormality.  Nuclear stress test [01/28/2015]: 1. The resting electrocardiogram demonstrated normal sinus rhythm, normal resting conduction, no resting arrhythmias and normal rest repolarization. The stress electrocardiogram was normal. Exercise using Bruce protocol for 5 minutes. The patient completed an estimated workload of 7.05 METS, 88% of the maximum predicted heart rate. The stress test was terminated because of fatigue. 2. Myocardial perfusion imaging is normal. Overall left ventricular systolic function was normal without regional wall motion abnormalities. The left ventricular ejection fraction was 79%.  Assessment     ICD-10-CM   1. Carotid artery disease, unspecified laterality, unspecified type (HCC)  I77.9 EKG 12-Lead  2. Hypercholesteremia  E78.00   3. Primary hypertension  I10     EKG 06/05/2019: Normal sinus rhythm at rate of 72 bpm, normal axis.  Nonspecific T abnormality, cannot exclude anterolateral ischemia.  Probably baseline artifact.  EKG 05/05/2018: Sinus rhythm at 70 bpm, normal axis, low-voltage complexes. No evidence of ischemia.  Recommendations:   She is here on annual visit and follow-up of asymptomatic bilateral carotid artery stenosis, hypertension and hyperlipidemia.  She remains asymptomatic, physical examination is unchanged from previous.  She also probably has peripheral artery disease as evidenced by decreased pulses in the lower extremities.  However she remains asymptomatic without claudication.  Lipids were to be checked, and also order CMP along with TSH.   Blood pressure is well controlled.  No changes in the medications were done today.  I will see her back on an annual basis.  She needs carotid artery duplex for surveillance.  Yates Decamp, MD, [facility] 06/05/2019, 3:12 PM [facility]  Pager: [phone] Office: [phone] If no answer Cell [phone]

## 2019-06-12 ENCOUNTER — Ambulatory Visit (INDEPENDENT_AMBULATORY_CARE_PROVIDER_SITE_OTHER): Payer: PPO

## 2019-06-12 ENCOUNTER — Other Ambulatory Visit: Payer: Self-pay

## 2019-06-12 DIAGNOSIS — I6523 Occlusion and stenosis of bilateral carotid arteries: Secondary | ICD-10-CM

## 2019-06-20 ENCOUNTER — Other Ambulatory Visit: Payer: Self-pay | Admitting: Cardiology

## 2019-06-20 DIAGNOSIS — I6523 Occlusion and stenosis of bilateral carotid arteries: Secondary | ICD-10-CM

## 2019-06-23 NOTE — Progress Notes (Signed)
Called pt to inform her about her [place], pt understand

## 2019-06-23 NOTE — Progress Notes (Signed)
Please call.

## 2019-07-01 ENCOUNTER — Ambulatory Visit: Payer: PPO | Attending: Physician Assistant | Admitting: Physical Therapy

## 2019-07-01 ENCOUNTER — Encounter: Payer: Self-pay | Admitting: Physical Therapy

## 2019-07-01 ENCOUNTER — Other Ambulatory Visit: Payer: Self-pay

## 2019-07-01 DIAGNOSIS — R296 Repeated falls: Secondary | ICD-10-CM

## 2019-07-01 DIAGNOSIS — M545 Low back pain, unspecified: Secondary | ICD-10-CM

## 2019-07-01 DIAGNOSIS — R2681 Unsteadiness on feet: Secondary | ICD-10-CM | POA: Insufficient documentation

## 2019-07-01 DIAGNOSIS — M542 Cervicalgia: Secondary | ICD-10-CM | POA: Insufficient documentation

## 2019-07-01 NOTE — Therapy (Signed)
44/56, she is hesitant and gaurded with her transfers and her gait.    Personal Factors and Comorbidities  Comorbidity 3+    Comorbidities  TIA, CVA, PVD, LBP, neuropathy, anxiety    Stability/Clinical Decision Making  Evolving/Moderate complexity    Clinical Decision Making  Moderate    Rehab Potential  Good    PT Frequency  2x / week    PT Duration  8 weeks    PT Treatment/Interventions  ADLs/Self Care Home Management;Cryotherapy;Electrical Stimulation;Moist Heat;Functional mobility Scientist, forensic;Therapeutic activities;Therapeutic exercise;Balance training;Neuromuscular re-education;Manual techniques;Patient/family education    PT Next Visit Plan  patient will transfer to our [place] site since she is familar with that site and how to get there, she will need balance activites, address pain and tightness    Consulted and Agree with Plan of Care  Patient       Patient will benefit from skilled therapeutic intervention in order to improve the following deficits and impairments:  Abnormal gait, Decreased range of motion, Decreased strength, Difficulty walking, Decreased balance, Impaired flexibility, Increased muscle spasms, Pain  Visit Diagnosis: Acute bilateral low back pain without sciatica  Cervicalgia  Repeated falls  Unsteadiness on feet     Problem List Patient Active Problem List   Diagnosis Date Noted  . Dizziness  08/14/2017  . Acute left hemiparesis ([place])   . TIA (transient ischemic attack) 06/01/2016  . CVA (cerebral vascular accident) ([place]) 06/01/2016  . Transient cerebral ischemia   . Complicated migraine 31/54/0086  . Multinodular goiter 08/09/2011  . Lumbar pain with radiation down right leg 04/11/2011  . Rash 04/11/2011  . Impaired glucose tolerance 01/14/2011  . Preventative health care 01/14/2011  . WRIST PAIN, BILATERAL 05/25/2010  . TIA 11/22/2009  . LOW BACK PAIN, CHRONIC 11/22/2009  . ANXIETY 10/03/2009  . DEPRESSION 10/03/2009  . PERIPHERAL NEUROPATHY 10/03/2009  . BARRETTS ESOPHAGUS 10/03/2009  . CONSTIPATION, CHRONIC 10/03/2009  . URETHRAL STRICTURE 10/03/2009  . POSITIVE PPD 10/03/2009  . VOCAL CORD POLYP, HX OF 10/03/2009  . Carotid artery disease ([place]) 08/31/2009  . Hyperlipemia 08/29/2009  . PERIPHERAL VASCULAR DISEASE 08/29/2009  . GERD 08/29/2009  . [place] DISEASE, CERVICAL 08/29/2009  . [place] DISEASE, LUMBAR 08/29/2009  . LEG PAIN, BILATERAL 08/29/2009  . TRANSIENT ISCHEMIC ATTACK, HX OF 08/29/2009  . COLONIC POLYPS, HX OF 08/29/2009    Sumner Boast., PT 07/01/2019, 5:50 PM  [address] [place], [place], [address] Phone: [phone]   Fax:  [phone]  Name: [name] MRN: [id number] Date of Birth: 1941/04/19  [facility] [address] [place], [place], [address] Phone: [phone]   Fax:  [phone]  Physical Therapy Evaluation  Patient Details  Name: [name] MRN: [id number] Date of Birth: [birth year] Referring Provider (PT): Trenda Moots, [place], Collie Siad MD   Encounter Date: 07/01/2019  PT End of Session - 07/01/19 1740    Visit Number  1    Date for PT Re-Evaluation  09/01/19    Authorization Type  [employer]    PT Start Time  1650    PT Stop Time  1740    PT Time Calculation (min)  50 min    Activity Tolerance  Patient tolerated treatment well    Behavior During Therapy  [employer] for tasks assessed/performed;Anxious       Past Medical History:  Diagnosis Date  . Acute sinus infection 01/15/2011  . ANXIETY 10/03/2009  . BARRETTS ESOPHAGUS 10/03/2009  . CAROTID ARTERY DISEASE 08/31/2009  . COLONIC POLYPS, HX OF 08/29/2009  . CONSTIPATION, CHRONIC 10/03/2009  . DEPRESSION 10/03/2009  . DISC DISEASE, CERVICAL 08/29/2009  . DISC DISEASE, LUMBAR 08/29/2009  . Eustachian tube dysfunction 01/15/2011  . GERD 08/29/2009  . HYPERLIPIDEMIA 08/29/2009  . Impaired glucose tolerance 01/14/2011  . LEG PAIN, BILATERAL 08/29/2009  . LOW BACK PAIN, CHRONIC 11/22/2009  . PERIPHERAL NEUROPATHY 10/03/2009  . PERIPHERAL VASCULAR DISEASE 08/29/2009  . POSITIVE PPD 10/03/2009  . Preventative health care 01/14/2011  . Stroke (HCC)   . Thyroid nodule 08/09/2011  . TIA 11/22/2009  . TRANSIENT ISCHEMIC ATTACK, HX OF 08/29/2009  . URETHRAL STRICTURE 10/03/2009  . VOCAL CORD POLYP, HX OF 10/03/2009  . WRIST PAIN, BILATERAL 05/25/2010    Past Surgical History:  Procedure Laterality Date  . hx of right ankle fracture    . s/p vocal cord polyps  1984   chronic hoarseness  . stress test negative  2003,2004,2006  . TUBAL LIGATION    . tubaligation    . uterine prolapse      There were no vitals filed for this visit.   Subjective Assessment  - 07/01/19 1702    Subjective  Patient comes in and she is frustrated and is anxiuos, she lives closer to this site but she did not like how her GPS brought her here and has been to our [facility] site in the past, we will schedule her at [facility] after this evaluation, she reports to me that she had two falls in September, she reports that she has weakness onthe right side, both of the falls were after getting out of bed, she denies dizziness just reports stumbles, she reports  a recent CT scan that was normal.  She does have some back pain and right side weakness and pain    Pertinent History  CVA, TIA, anxiety, PVD, DDD, LBP    Limitations  Walking    Patient Stated Goals  walk better, no falls, be more stable, have less pain, sleep better    Currently in Pain?  Yes    Pain Score  2     Pain Location  Leg    Pain Orientation  Right;Upper;Lower    Pain Descriptors / Indicators  Tightness    Pain Radiating Towards  she does c/o numbness and tingling in the right leg and arm worse at night, but reports that she feels it all the time, "drawing and pulling"    Pain Onset  More than a month ago  44/56, she is hesitant and gaurded with her transfers and her gait.    Personal Factors and Comorbidities  Comorbidity 3+    Comorbidities  TIA, CVA, PVD, LBP, neuropathy, anxiety    Stability/Clinical Decision Making  Evolving/Moderate complexity    Clinical Decision Making  Moderate    Rehab Potential  Good    PT Frequency  2x / week    PT Duration  8 weeks    PT Treatment/Interventions  ADLs/Self Care Home Management;Cryotherapy;Electrical Stimulation;Moist Heat;Functional mobility Scientist, forensic;Therapeutic activities;Therapeutic exercise;Balance training;Neuromuscular re-education;Manual techniques;Patient/family education    PT Next Visit Plan  patient will transfer to our [place] site since she is familar with that site and how to get there, she will need balance activites, address pain and tightness    Consulted and Agree with Plan of Care  Patient       Patient will benefit from skilled therapeutic intervention in order to improve the following deficits and impairments:  Abnormal gait, Decreased range of motion, Decreased strength, Difficulty walking, Decreased balance, Impaired flexibility, Increased muscle spasms, Pain  Visit Diagnosis: Acute bilateral low back pain without sciatica  Cervicalgia  Repeated falls  Unsteadiness on feet     Problem List Patient Active Problem List   Diagnosis Date Noted  . Dizziness  08/14/2017  . Acute left hemiparesis ([place])   . TIA (transient ischemic attack) 06/01/2016  . CVA (cerebral vascular accident) ([place]) 06/01/2016  . Transient cerebral ischemia   . Complicated migraine 31/54/0086  . Multinodular goiter 08/09/2011  . Lumbar pain with radiation down right leg 04/11/2011  . Rash 04/11/2011  . Impaired glucose tolerance 01/14/2011  . Preventative health care 01/14/2011  . WRIST PAIN, BILATERAL 05/25/2010  . TIA 11/22/2009  . LOW BACK PAIN, CHRONIC 11/22/2009  . ANXIETY 10/03/2009  . DEPRESSION 10/03/2009  . PERIPHERAL NEUROPATHY 10/03/2009  . BARRETTS ESOPHAGUS 10/03/2009  . CONSTIPATION, CHRONIC 10/03/2009  . URETHRAL STRICTURE 10/03/2009  . POSITIVE PPD 10/03/2009  . VOCAL CORD POLYP, HX OF 10/03/2009  . Carotid artery disease ([place]) 08/31/2009  . Hyperlipemia 08/29/2009  . PERIPHERAL VASCULAR DISEASE 08/29/2009  . GERD 08/29/2009  . [place] DISEASE, CERVICAL 08/29/2009  . [place] DISEASE, LUMBAR 08/29/2009  . LEG PAIN, BILATERAL 08/29/2009  . TRANSIENT ISCHEMIC ATTACK, HX OF 08/29/2009  . COLONIC POLYPS, HX OF 08/29/2009    Sumner Boast., PT 07/01/2019, 5:50 PM  [address] [place], [place], [address] Phone: [phone]   Fax:  [phone]  Name: [name] MRN: [id number] Date of Birth: 1941/04/19  [facility] [address] [place], [place], [address] Phone: [phone]   Fax:  [phone]  Physical Therapy Evaluation  Patient Details  Name: [name] MRN: [id number] Date of Birth: [birth year] Referring Provider (PT): Trenda Moots, [place], Collie Siad MD   Encounter Date: 07/01/2019  PT End of Session - 07/01/19 1740    Visit Number  1    Date for PT Re-Evaluation  09/01/19    Authorization Type  [employer]    PT Start Time  1650    PT Stop Time  1740    PT Time Calculation (min)  50 min    Activity Tolerance  Patient tolerated treatment well    Behavior During Therapy  [employer] for tasks assessed/performed;Anxious       Past Medical History:  Diagnosis Date  . Acute sinus infection 01/15/2011  . ANXIETY 10/03/2009  . BARRETTS ESOPHAGUS 10/03/2009  . CAROTID ARTERY DISEASE 08/31/2009  . COLONIC POLYPS, HX OF 08/29/2009  . CONSTIPATION, CHRONIC 10/03/2009  . DEPRESSION 10/03/2009  . DISC DISEASE, CERVICAL 08/29/2009  . DISC DISEASE, LUMBAR 08/29/2009  . Eustachian tube dysfunction 01/15/2011  . GERD 08/29/2009  . HYPERLIPIDEMIA 08/29/2009  . Impaired glucose tolerance 01/14/2011  . LEG PAIN, BILATERAL 08/29/2009  . LOW BACK PAIN, CHRONIC 11/22/2009  . PERIPHERAL NEUROPATHY 10/03/2009  . PERIPHERAL VASCULAR DISEASE 08/29/2009  . POSITIVE PPD 10/03/2009  . Preventative health care 01/14/2011  . Stroke (HCC)   . Thyroid nodule 08/09/2011  . TIA 11/22/2009  . TRANSIENT ISCHEMIC ATTACK, HX OF 08/29/2009  . URETHRAL STRICTURE 10/03/2009  . VOCAL CORD POLYP, HX OF 10/03/2009  . WRIST PAIN, BILATERAL 05/25/2010    Past Surgical History:  Procedure Laterality Date  . hx of right ankle fracture    . s/p vocal cord polyps  1984   chronic hoarseness  . stress test negative  2003,2004,2006  . TUBAL LIGATION    . tubaligation    . uterine prolapse      There were no vitals filed for this visit.   Subjective Assessment  - 07/01/19 1702    Subjective  Patient comes in and she is frustrated and is anxiuos, she lives closer to this site but she did not like how her GPS brought her here and has been to our [place] site in the past, we will schedule her at [facility] after this evaluation, she reports to me that she had two falls in September, she reports that she has weakness onthe right side, both of the falls were after getting out of bed, she denies dizziness just reports stumbles, she reports  a recent CT scan that was normal.  She does have some back pain and right side weakness and pain    Pertinent History  CVA, TIA, anxiety, PVD, DDD, LBP    Limitations  Walking    Patient Stated Goals  walk better, no falls, be more stable, have less pain, sleep better    Currently in Pain?  Yes    Pain Score  2     Pain Location  Leg    Pain Orientation  Right;Upper;Lower    Pain Descriptors / Indicators  Tightness    Pain Radiating Towards  she does c/o numbness and tingling in the right leg and arm worse at night, but reports that she feels it all the time, "drawing and pulling"    Pain Onset  More than a month ago

## 2019-07-06 ENCOUNTER — Ambulatory Visit: Payer: PPO | Admitting: Physical Therapy

## 2019-07-07 ENCOUNTER — Other Ambulatory Visit: Payer: Self-pay

## 2019-07-07 ENCOUNTER — Ambulatory Visit: Payer: PPO | Admitting: Physical Therapy

## 2019-07-07 DIAGNOSIS — R2681 Unsteadiness on feet: Secondary | ICD-10-CM

## 2019-07-07 DIAGNOSIS — R296 Repeated falls: Secondary | ICD-10-CM

## 2019-07-07 DIAGNOSIS — M542 Cervicalgia: Secondary | ICD-10-CM

## 2019-07-07 DIAGNOSIS — M545 Low back pain, unspecified: Secondary | ICD-10-CM

## 2019-07-07 NOTE — Therapy (Signed)
[facility] [address] [place], [place], [address] Phone: [phone]   Fax:  [phone]  Physical Therapy Treatment  Patient Details  Name: [name] MRN: [id number] Date of Birth: 02/04/1941 Referring Provider (PT): Trenda Moots, [place], Collie Siad MD   Encounter Date: 07/07/2019  PT End of Session - 07/07/19 1400    Visit Number  2    Date for PT Re-Evaluation  09/01/19    Authorization Type  [employer]    PT Start Time  1230    PT Stop Time  1310    PT Time Calculation (min)  40 min    Activity Tolerance  Patient tolerated treatment well       Past Medical History:  Diagnosis Date  . Acute sinus infection 01/15/2011  . ANXIETY 10/03/2009  . BARRETTS ESOPHAGUS 10/03/2009  . CAROTID ARTERY DISEASE 08/31/2009  . COLONIC POLYPS, HX OF 08/29/2009  . CONSTIPATION, CHRONIC 10/03/2009  . DEPRESSION 10/03/2009  . DISC DISEASE, CERVICAL 08/29/2009  . DISC DISEASE, LUMBAR 08/29/2009  . Eustachian tube dysfunction 01/15/2011  . GERD 08/29/2009  . HYPERLIPIDEMIA 08/29/2009  . Impaired glucose tolerance 01/14/2011  . LEG PAIN, BILATERAL 08/29/2009  . LOW BACK PAIN, CHRONIC 11/22/2009  . PERIPHERAL NEUROPATHY 10/03/2009  . PERIPHERAL VASCULAR DISEASE 08/29/2009  . POSITIVE PPD 10/03/2009  . Preventative health care 01/14/2011  . Stroke (HCC)   . Thyroid nodule 08/09/2011  . TIA 11/22/2009  . TRANSIENT ISCHEMIC ATTACK, HX OF 08/29/2009  . URETHRAL STRICTURE 10/03/2009  . VOCAL CORD POLYP, HX OF 10/03/2009  . WRIST PAIN, BILATERAL 05/25/2010    Past Surgical History:  Procedure Laterality Date  . hx of right ankle fracture    . s/p vocal cord polyps  1984   chronic hoarseness  . stress test negative  2003,2004,2006  . TUBAL LIGATION    . tubaligation    . uterine prolapse      There were no vitals filed for this visit.  Subjective Assessment - 07/07/19 1232    Subjective  It's not so much the back pain, it's  the whole right side after falling.  The next night I fell on my left knee.  My balance is terrible.  I have active dreams and I usually fall during the night.    Pertinent History  CVA, TIA, anxiety, PVD, DDD, LBP    Currently in Pain?  Yes    Pain Score  8     Pain Location  Leg    Pain Orientation  Right    Aggravating Factors   night pain                       [facility] Adult PT Treatment/Exercise - 07/07/19 0001      Lumbar Exercises: Stretches   Active Hamstring Stretch  Right;Left;3 reps    Active Hamstring Stretch Limitations  seated     Lower Trunk Rotation  3 reps;30 seconds      Lumbar Exercises: Standing   Heel Raises  10 reps      Lumbar Exercises: Seated   Sit to Stand  5 reps    Other Seated Lumbar Exercises  foam roll push down 10x       Lumbar Exercises: Supine   Ab Set  5 reps    Bridge  10 reps    Other Supine Lumbar Exercises  review of [place] Flexion ex from initial visit.  Lumbar Exercises: Sidelying   Clam  Right;Left;10 reps               PT Short Term Goals - 07/01/19 1748      PT SHORT TERM GOAL #1   Time  2    Period  Weeks    Status  New        PT Long Term Goals - 07/01/19 1748      PT LONG TERM GOAL #1   Title  Berg balance 53/56 for reduced risk of falls    Time  8    Period  Weeks    Status  New      PT LONG TERM GOAL #2   Title  Pt reports she is able to walk up to 1/2 mile without stopping due to fatigue or balance issues for return to previous walking activities    Time  8    Period  Weeks    Status  New      PT LONG TERM GOAL #3   Title  LE strength improved to 5/5 MMT bilateral for improved gait without Trendelenburg on the right    Time  8    Period  Weeks    Status  New      PT LONG TERM GOAL #4   Title  patient will report pain decrease 50%    Time  8    Period  Weeks    Status  New            Plan - 07/07/19 1401    Clinical Impression Statement  The patient reports she is  less stressed today than she was on initial appt b/c she is familiar with how to get to this facility.  She is able to demonstrate her initial HEP with minimal cues with lumbar rotation.  Initiated lumbo/pelvic/hip strengthening with cues needed to avoid holding her breath.  She has some back pain during ex's which is improved with correction of muscle activation technique.  Therapist closely monitoring response throughout treatment session.    Rehab Potential  Good    PT Frequency  2x / week    PT Duration  8 weeks    PT Treatment/Interventions  ADLs/Self Care Home Management;Cryotherapy;Electrical Stimulation;Moist Heat;Functional mobility Network engineer;Therapeutic activities;Therapeutic exercise;Balance training;Neuromuscular re-education;Manual techniques;Patient/family education    PT Next Visit Plan  review supine, sitting core HEP;  add standing ex's to HEP including ex's to help balance       Patient will benefit from skilled therapeutic intervention in order to improve the following deficits and impairments:  Abnormal gait, Decreased range of motion, Decreased strength, Difficulty walking, Decreased balance, Impaired flexibility, Increased muscle spasms, Pain  Visit Diagnosis: Acute bilateral low back pain without sciatica  Cervicalgia  Repeated falls  Unsteadiness on feet     Problem List Patient Active Problem List   Diagnosis Date Noted  . Dizziness 08/14/2017  . Acute left hemiparesis (HCC)   . TIA (transient ischemic attack) 06/01/2016  . CVA (cerebral vascular accident) (HCC) 06/01/2016  . Transient cerebral ischemia   . Complicated migraine 08/22/2014  . Multinodular goiter 08/09/2011  . Lumbar pain with radiation down right leg 04/11/2011  . Rash 04/11/2011  . Impaired glucose tolerance 01/14/2011  . Preventative health care 01/14/2011  . WRIST PAIN, BILATERAL 05/25/2010  . TIA 11/22/2009  . LOW BACK PAIN, CHRONIC 11/22/2009  . ANXIETY 10/03/2009  .  DEPRESSION 10/03/2009  . PERIPHERAL NEUROPATHY 10/03/2009  . BARRETTS ESOPHAGUS  10/03/2009  . CONSTIPATION, CHRONIC 10/03/2009  . URETHRAL STRICTURE 10/03/2009  . POSITIVE PPD 10/03/2009  . VOCAL CORD POLYP, HX OF 10/03/2009  . Carotid artery disease (HCC) 08/31/2009  . Hyperlipemia 08/29/2009  . PERIPHERAL VASCULAR DISEASE 08/29/2009  . GERD 08/29/2009  . DISC DISEASE, CERVICAL 08/29/2009  . DISC DISEASE, LUMBAR 08/29/2009  . LEG PAIN, BILATERAL 08/29/2009  . TRANSIENT ISCHEMIC ATTACK, HX OF 08/29/2009  . COLONIC POLYPS, HX OF 08/29/2009   Lavinia Sharps, PT 07/07/19 4:39 PM Phone: [phone] Fax: [phone] Vivien Presto 07/07/2019, 4:38 PM  [facility] [address], [address] [place], [place], [address] Phone: [phone]   Fax:  [phone]  Name: HEYLIN DARGAN MRN: [id number] Date of Birth: 1941/04/27

## 2019-07-13 ENCOUNTER — Ambulatory Visit: Payer: PPO

## 2019-07-13 ENCOUNTER — Telehealth: Payer: Self-pay

## 2019-07-13 NOTE — Telephone Encounter (Signed)
PT called pt due to no-show appointment.  No answer and not able to leave a message.

## 2019-07-14 ENCOUNTER — Other Ambulatory Visit: Payer: Self-pay

## 2019-07-14 ENCOUNTER — Ambulatory Visit: Payer: PPO | Admitting: Physical Therapy

## 2019-07-14 DIAGNOSIS — R2681 Unsteadiness on feet: Secondary | ICD-10-CM

## 2019-07-14 DIAGNOSIS — R296 Repeated falls: Secondary | ICD-10-CM

## 2019-07-14 DIAGNOSIS — M542 Cervicalgia: Secondary | ICD-10-CM

## 2019-07-14 DIAGNOSIS — M545 Low back pain, unspecified: Secondary | ICD-10-CM

## 2019-07-14 NOTE — Therapy (Signed)
Dizziness 08/14/2017  . Acute left hemiparesis (HCC)   . TIA (transient ischemic attack) 06/01/2016  . CVA (cerebral vascular accident) (HCC) 06/01/2016  . Transient cerebral ischemia   . Complicated migraine 08/22/2014  . Multinodular goiter 08/09/2011  . Lumbar pain with radiation down right leg 04/11/2011  . Rash 04/11/2011  . Impaired glucose tolerance 01/14/2011  . Preventative health care 01/14/2011  . WRIST PAIN, BILATERAL 05/25/2010  . TIA 11/22/2009  . LOW BACK PAIN, CHRONIC 11/22/2009  . ANXIETY 10/03/2009  . DEPRESSION 10/03/2009  . PERIPHERAL NEUROPATHY 10/03/2009  . BARRETTS ESOPHAGUS 10/03/2009  . CONSTIPATION, CHRONIC 10/03/2009  . URETHRAL STRICTURE 10/03/2009  . POSITIVE PPD 10/03/2009  . VOCAL CORD POLYP, HX OF 10/03/2009  . Carotid artery disease (HCC) 08/31/2009  . Hyperlipemia 08/29/2009  . PERIPHERAL VASCULAR DISEASE 08/29/2009  . GERD 08/29/2009  . DISC DISEASE, CERVICAL 08/29/2009  . DISC DISEASE, LUMBAR 08/29/2009  . LEG PAIN, BILATERAL 08/29/2009  . TRANSIENT ISCHEMIC ATTACK, HX OF 08/29/2009  . COLONIC POLYPS, HX OF 08/29/2009   Lavinia Sharps, PT 07/14/19 5:19 PM Phone: [phone] Fax: [phone] Vivien Presto 07/14/2019, 5:18 PM  [facility] [address] [place], [place], [address] Phone: [phone]   Fax:  [phone]  Name: [name] MRN: [id number] Date of Birth: May 25, 1941  [facility] [address] [place], [place], [address] Phone: [phone]   Fax:  [phone]  Physical Therapy Treatment  Patient Details  Name: [name] MRN: [id number] Date of Birth: 18-Mar-1941 Referring Provider (PT): Trenda MootsMary Stanberry, [place], Collie SiadZoe Stallings MD   Encounter Date: 07/14/2019  PT End of Session - 07/14/19 1713    Visit Number  3    Date for PT Re-Evaluation  09/01/19    Authorization Type  [employer]    PT Start Time  1628    PT Stop Time  1708    PT Time Calculation (min)  40 min    Activity Tolerance  Patient tolerated treatment well       Past Medical History:  Diagnosis Date  . Acute sinus infection 01/15/2011  . ANXIETY 10/03/2009  . BARRETTS ESOPHAGUS 10/03/2009  . CAROTID ARTERY DISEASE 08/31/2009  . COLONIC POLYPS, HX OF 08/29/2009  . CONSTIPATION, CHRONIC 10/03/2009  . DEPRESSION 10/03/2009  . DISC DISEASE, CERVICAL 08/29/2009  . DISC DISEASE, LUMBAR 08/29/2009  . Eustachian tube dysfunction 01/15/2011  . GERD 08/29/2009  . HYPERLIPIDEMIA 08/29/2009  . Impaired glucose tolerance 01/14/2011  . LEG PAIN, BILATERAL 08/29/2009  . LOW BACK PAIN, CHRONIC 11/22/2009  . PERIPHERAL NEUROPATHY 10/03/2009  . PERIPHERAL VASCULAR DISEASE 08/29/2009  . POSITIVE PPD 10/03/2009  . Preventative health care 01/14/2011  . Stroke (HCC)   . Thyroid nodule 08/09/2011  . TIA 11/22/2009  . TRANSIENT ISCHEMIC ATTACK, HX OF 08/29/2009  . URETHRAL STRICTURE 10/03/2009  . VOCAL CORD POLYP, HX OF 10/03/2009  . WRIST PAIN, BILATERAL 05/25/2010    Past Surgical History:  Procedure Laterality Date  . hx of right ankle fracture    . s/p vocal cord polyps  1984   chronic hoarseness  . stress test negative  2003,2004,2006  . TUBAL LIGATION    . tubaligation    . uterine prolapse      There were no vitals filed for this visit.  Subjective Assessment - 07/14/19 1628    Subjective  I was so nervous driving here that I  forgot the way.  Today [birth year] my birthday.  I had a surpise zoom party with over a hundred people last night.   I had some pain in right arm and leg but none right now.  I walked 2 miles the other day.    Pertinent History  CVA, TIA, anxiety, PVD, DDD, LBP    Patient Stated Goals  walk better, no falls, be more stable, have less pain, sleep better    Currently in Pain?  No/denies    Pain Score  0-No pain                       [facility] Adult PT Treatment/Exercise - 07/14/19 0001      Therapeutic Activites    ADL's  hip hinge with and without dowel on back; dead lift to knees 5# weight 10x       Lumbar Exercises: Stretches   Lower Trunk Rotation  3 reps;30 seconds      Lumbar Exercises: Standing   Heel Raises  10 reps    Heel Raises Limitations  and toes 10x     Side Lunge Limitations  sidestepping 3 laps     Other Standing Lumbar Exercises  SLS 6x right/left with light UE support    Other Standing Lumbar Exercises  hip 3 ways 10x each side 1 arm support  Dizziness 08/14/2017  . Acute left hemiparesis (HCC)   . TIA (transient ischemic attack) 06/01/2016  . CVA (cerebral vascular accident) (HCC) 06/01/2016  . Transient cerebral ischemia   . Complicated migraine 08/22/2014  . Multinodular goiter 08/09/2011  . Lumbar pain with radiation down right leg 04/11/2011  . Rash 04/11/2011  . Impaired glucose tolerance 01/14/2011  . Preventative health care 01/14/2011  . WRIST PAIN, BILATERAL 05/25/2010  . TIA 11/22/2009  . LOW BACK PAIN, CHRONIC 11/22/2009  . ANXIETY 10/03/2009  . DEPRESSION 10/03/2009  . PERIPHERAL NEUROPATHY 10/03/2009  . BARRETTS ESOPHAGUS 10/03/2009  . CONSTIPATION, CHRONIC 10/03/2009  . URETHRAL STRICTURE 10/03/2009  . POSITIVE PPD 10/03/2009  . VOCAL CORD POLYP, HX OF 10/03/2009  . Carotid artery disease (HCC) 08/31/2009  . Hyperlipemia 08/29/2009  . PERIPHERAL VASCULAR DISEASE 08/29/2009  . GERD 08/29/2009  . DISC DISEASE, CERVICAL 08/29/2009  . DISC DISEASE, LUMBAR 08/29/2009  . LEG PAIN, BILATERAL 08/29/2009  . TRANSIENT ISCHEMIC ATTACK, HX OF 08/29/2009  . COLONIC POLYPS, HX OF 08/29/2009   Lavinia Sharps, PT 07/14/19 5:19 PM Phone: [phone] Fax: [phone] Vivien Presto 07/14/2019, 5:18 PM  [facility] [address] [place], [place], [address] Phone: [phone]   Fax:  [phone]  Name: [name] MRN: [id number] Date of Birth: May 25, 1941

## 2019-07-14 NOTE — Patient Instructions (Signed)
Access Code: [email or web address]  URL: [email or web address]  Date: 07/14/2019  Prepared by: Ruben Im   Exercises Hooklying Isometric Hip Flexion - 10 reps - 1 sets - 1x daily - 7x weekly Seated Transversus Abdominis Bracing - 10 reps - 1 sets - 1x daily - 7x weekly Sit to Stand - 10 reps - 1 sets - 1x daily - 7x weekly Heel Toe Raises with Counter Support - 10 reps - 1 sets - 1x daily - 7x weekly Standing Hip Abduction - 10 reps - 1 sets - 1x daily - 7x weekly Standing Hip Extension - 10 reps - 1 sets - 1x daily - 7x weekly Standing Hip Flexion - 10 reps - 1 sets - 1x daily - 7x weekly

## 2019-07-17 ENCOUNTER — Encounter: Payer: Self-pay | Admitting: *Deleted

## 2019-07-21 ENCOUNTER — Encounter: Payer: Self-pay | Admitting: Physical Therapy

## 2019-07-21 ENCOUNTER — Ambulatory Visit: Payer: PPO | Admitting: Physical Therapy

## 2019-07-21 ENCOUNTER — Other Ambulatory Visit: Payer: Self-pay

## 2019-07-21 DIAGNOSIS — R296 Repeated falls: Secondary | ICD-10-CM

## 2019-07-21 DIAGNOSIS — M545 Low back pain, unspecified: Secondary | ICD-10-CM

## 2019-07-21 DIAGNOSIS — R2681 Unsteadiness on feet: Secondary | ICD-10-CM

## 2019-07-21 DIAGNOSIS — M542 Cervicalgia: Secondary | ICD-10-CM

## 2019-07-21 NOTE — Therapy (Signed)
[facility] [address], [address] [place], [place], [address] Phone: [phone]   Fax:  [phone]  Physical Therapy Treatment  Patient Details  Name: [name] MRN: [id number] Date of Birth: Jul 15, 1941 Referring Provider (PT): Trenda MootsMary Stanberry, [place], Collie SiadZoe Stallings MD   Encounter Date: 07/21/2019  PT End of Session - 07/21/19 1351    Visit Number  4    Date for PT Re-Evaluation  09/01/19    Authorization Type  [employer]    PT Start Time  1242   pt late   PT Stop Time  1315    PT Time Calculation (min)  33 min    Activity Tolerance  Patient tolerated treatment well       Past Medical History:  Diagnosis Date  . Acute sinus infection 01/15/2011  . ANXIETY 10/03/2009  . BARRETTS ESOPHAGUS 10/03/2009  . CAROTID ARTERY DISEASE 08/31/2009  . COLONIC POLYPS, HX OF 08/29/2009  . CONSTIPATION, CHRONIC 10/03/2009  . DEPRESSION 10/03/2009  . DISC DISEASE, CERVICAL 08/29/2009  . DISC DISEASE, LUMBAR 08/29/2009  . Eustachian tube dysfunction 01/15/2011  . GERD 08/29/2009  . HYPERLIPIDEMIA 08/29/2009  . Impaired glucose tolerance 01/14/2011  . LEG PAIN, BILATERAL 08/29/2009  . LOW BACK PAIN, CHRONIC 11/22/2009  . PERIPHERAL NEUROPATHY 10/03/2009  . PERIPHERAL VASCULAR DISEASE 08/29/2009  . POSITIVE PPD 10/03/2009  . Preventative health care 01/14/2011  . Stroke (HCC)   . Thyroid nodule 08/09/2011  . TIA 11/22/2009  . TRANSIENT ISCHEMIC ATTACK, HX OF 08/29/2009  . URETHRAL STRICTURE 10/03/2009  . VOCAL CORD POLYP, HX OF 10/03/2009  . WRIST PAIN, BILATERAL 05/25/2010    Past Surgical History:  Procedure Laterality Date  . hx of right ankle fracture    . s/p vocal cord polyps  1984   chronic hoarseness  . stress test negative  2003,2004,2006  . TUBAL LIGATION    . tubaligation    . uterine prolapse      There were no vitals filed for this visit.  Subjective Assessment - 07/21/19 1247    Subjective  My whole right side bothered  me last night.    Pertinent History  CVA, TIA, anxiety, PVD, DDD, LBP    Patient Stated Goals  walk better, no falls, be more stable, have less pain, sleep better    Currently in Pain?  No/denies    Pain Score  0-No pain    Pain Location  Leg    Pain Orientation  Right                       [facility] Adult PT Treatment/Exercise - 07/21/19 0001      Therapeutic Activites    ADL's  sit to stand, walking, lifting       Lumbar Exercises: Stretches   Active Hamstring Stretch  Right;Left;5 reps    Active Hamstring Stretch Limitations  standing 2nd step    Other Lumbar Stretch Exercise  2nd step hip flexor stretch with UE elevation      Lumbar Exercises: Standing   Other Standing Lumbar Exercises  dead lift 5# 2x10    Other Standing Lumbar Exercises  walking laps moderate speed 3x       Lumbar Exercises: Seated   Sit to Stand  20 reps    Sit to Stand Limitations  holding 5# weight    with arm press overhead     Knee/Hip Exercises: Standing   Hip Abduction  Stengthening;Right;Left;5 reps  Impaired glucose tolerance 01/14/2011  . Preventative health care 01/14/2011  . WRIST PAIN, BILATERAL 05/25/2010  . TIA 11/22/2009  . LOW BACK PAIN, CHRONIC 11/22/2009  . ANXIETY 10/03/2009  . DEPRESSION 10/03/2009  . PERIPHERAL NEUROPATHY 10/03/2009  . BARRETTS ESOPHAGUS 10/03/2009  . CONSTIPATION, CHRONIC 10/03/2009  . URETHRAL STRICTURE 10/03/2009  . POSITIVE PPD 10/03/2009  . VOCAL CORD POLYP, HX OF 10/03/2009  . Carotid artery disease (HCC) 08/31/2009  . Hyperlipemia 08/29/2009  . PERIPHERAL VASCULAR DISEASE 08/29/2009  . GERD 08/29/2009  . DISC DISEASE, CERVICAL 08/29/2009  . DISC DISEASE, LUMBAR 08/29/2009  . LEG PAIN, BILATERAL 08/29/2009  . TRANSIENT ISCHEMIC ATTACK, HX OF 08/29/2009  . COLONIC POLYPS, HX OF 08/29/2009   Lavinia Sharps, PT 07/21/19 5:19 PM Phone: [phone] Fax: [phone] Vivien Presto 07/21/2019, 5:17 PM  [facility] [address], [address] [place], [place], [address] Phone: [phone]   Fax:  [phone]  Name: [name] MRN: [id number] Date of Birth: 1941/04/07  [facility] [address], [address] [place], [place], [address] Phone: [phone]   Fax:  [phone]  Physical Therapy Treatment  Patient Details  Name: [name] MRN: [id number] Date of Birth: Jul 15, 1941 Referring Provider (PT): Trenda MootsMary Stanberry, [place], Collie SiadZoe Stallings MD   Encounter Date: 07/21/2019  PT End of Session - 07/21/19 1351    Visit Number  4    Date for PT Re-Evaluation  09/01/19    Authorization Type  [employer]    PT Start Time  1242   pt late   PT Stop Time  1315    PT Time Calculation (min)  33 min    Activity Tolerance  Patient tolerated treatment well       Past Medical History:  Diagnosis Date  . Acute sinus infection 01/15/2011  . ANXIETY 10/03/2009  . BARRETTS ESOPHAGUS 10/03/2009  . CAROTID ARTERY DISEASE 08/31/2009  . COLONIC POLYPS, HX OF 08/29/2009  . CONSTIPATION, CHRONIC 10/03/2009  . DEPRESSION 10/03/2009  . DISC DISEASE, CERVICAL 08/29/2009  . DISC DISEASE, LUMBAR 08/29/2009  . Eustachian tube dysfunction 01/15/2011  . GERD 08/29/2009  . HYPERLIPIDEMIA 08/29/2009  . Impaired glucose tolerance 01/14/2011  . LEG PAIN, BILATERAL 08/29/2009  . LOW BACK PAIN, CHRONIC 11/22/2009  . PERIPHERAL NEUROPATHY 10/03/2009  . PERIPHERAL VASCULAR DISEASE 08/29/2009  . POSITIVE PPD 10/03/2009  . Preventative health care 01/14/2011  . Stroke (HCC)   . Thyroid nodule 08/09/2011  . TIA 11/22/2009  . TRANSIENT ISCHEMIC ATTACK, HX OF 08/29/2009  . URETHRAL STRICTURE 10/03/2009  . VOCAL CORD POLYP, HX OF 10/03/2009  . WRIST PAIN, BILATERAL 05/25/2010    Past Surgical History:  Procedure Laterality Date  . hx of right ankle fracture    . s/p vocal cord polyps  1984   chronic hoarseness  . stress test negative  2003,2004,2006  . TUBAL LIGATION    . tubaligation    . uterine prolapse      There were no vitals filed for this visit.  Subjective Assessment - 07/21/19 1247    Subjective  My whole right side bothered  me last night.    Pertinent History  CVA, TIA, anxiety, PVD, DDD, LBP    Patient Stated Goals  walk better, no falls, be more stable, have less pain, sleep better    Currently in Pain?  No/denies    Pain Score  0-No pain    Pain Location  Leg    Pain Orientation  Right                       [facility] Adult PT Treatment/Exercise - 07/21/19 0001      Therapeutic Activites    ADL's  sit to stand, walking, lifting       Lumbar Exercises: Stretches   Active Hamstring Stretch  Right;Left;5 reps    Active Hamstring Stretch Limitations  standing 2nd step    Other Lumbar Stretch Exercise  2nd step hip flexor stretch with UE elevation      Lumbar Exercises: Standing   Other Standing Lumbar Exercises  dead lift 5# 2x10    Other Standing Lumbar Exercises  walking laps moderate speed 3x       Lumbar Exercises: Seated   Sit to Stand  20 reps    Sit to Stand Limitations  holding 5# weight    with arm press overhead     Knee/Hip Exercises: Standing   Hip Abduction  Stengthening;Right;Left;5 reps

## 2019-07-28 ENCOUNTER — Ambulatory Visit: Payer: PPO | Admitting: Physical Therapy

## 2019-07-28 ENCOUNTER — Encounter: Payer: Self-pay | Admitting: Physical Therapy

## 2019-07-28 ENCOUNTER — Other Ambulatory Visit: Payer: Self-pay

## 2019-07-28 DIAGNOSIS — R2681 Unsteadiness on feet: Secondary | ICD-10-CM

## 2019-07-28 DIAGNOSIS — R296 Repeated falls: Secondary | ICD-10-CM

## 2019-07-28 DIAGNOSIS — M545 Low back pain, unspecified: Secondary | ICD-10-CM

## 2019-07-28 DIAGNOSIS — M542 Cervicalgia: Secondary | ICD-10-CM

## 2019-07-28 NOTE — Patient Instructions (Signed)
Access Code: [email or web address]  URL: [email or web address]  Date: 07/28/2019  Prepared by: Ruben Im   Exercises Hooklying Isometric Hip Flexion - 10 reps - 1 sets - 1x daily - 7x weekly Seated Transversus Abdominis Bracing - 10 reps - 1 sets - 1x daily - 7x weekly Sit to Stand - 10 reps - 1 sets - 1x daily - 7x weekly Heel Toe Raises with Counter Support - 10 reps - 1 sets - 1x daily - 7x weekly Standing Hip Abduction - 10 reps - 1 sets - 1x daily - 7x weekly Standing Hip Extension - 10 reps - 1 sets - 1x daily - 7x weekly Standing Hip Flexion - 10 reps - 1 sets - 1x daily - 7x weekly Tandem Stance with Support - 6 reps - 1 sets - 10 hold - 1x daily - 7x weekly Standing Single Leg Stance with Counter Support - 6 reps - 1 sets                   - 10 hold - 1x daily - 7x weekly Step Taps with Unilateral Counter Support - 10 reps - 2 sets - 1x daily - 7x weekly Forward Walking with Full Turns - Quick - 2 reps - 1 sets - 1x daily - 7x weekly Forward Step Up with Counter Support - 10 reps - 2 sets - 1x daily                            - 7x weekly

## 2019-07-28 NOTE — Therapy (Addendum)
[facility] [address], [place], [place], [address] Phone: [phone]   Fax:  [phone]  Physical Therapy Treatment/Discharge Summary   Patient Details  Name: [name] MRN: [id number] Date of Birth: 07-24-1941 Referring Provider (PT): Tiney Rouge, [place], Delia Chimes MD   Encounter Date: 07/28/2019  PT End of Session - 07/28/19 1736    Visit Number  5    Date for PT Re-Evaluation  09/01/19    Authorization Type  [employer]    PT Start Time  1103    PT Stop Time  1145    PT Time Calculation (min)  42 min    Activity Tolerance  Patient tolerated treatment well       Past Medical History:  Diagnosis Date  . Acute sinus infection 01/15/2011  . ANXIETY 10/03/2009  . BARRETTS ESOPHAGUS 10/03/2009  . CAROTID ARTERY DISEASE 08/31/2009  . COLONIC POLYPS, HX OF 08/29/2009  . CONSTIPATION, CHRONIC 10/03/2009  . DEPRESSION 10/03/2009  . [place] DISEASE, CERVICAL 08/29/2009  . [place] DISEASE, LUMBAR 08/29/2009  . Eustachian tube dysfunction 01/15/2011  . GERD 08/29/2009  . HYPERLIPIDEMIA 08/29/2009  . Impaired glucose tolerance 01/14/2011  . LEG PAIN, BILATERAL 08/29/2009  . LOW BACK PAIN, CHRONIC 11/22/2009  . PERIPHERAL NEUROPATHY 10/03/2009  . PERIPHERAL VASCULAR DISEASE 08/29/2009  . POSITIVE PPD 10/03/2009  . Preventative health care 01/14/2011  . Stroke ([place])   . Thyroid nodule 08/09/2011  . TIA 11/22/2009  . TRANSIENT ISCHEMIC ATTACK, HX OF 08/29/2009  . URETHRAL STRICTURE 10/03/2009  . VOCAL CORD POLYP, HX OF 10/03/2009  . WRIST PAIN, BILATERAL 05/25/2010    Past Surgical History:  Procedure Laterality Date  . hx of right ankle fracture    . s/p vocal cord polyps  1984   chronic hoarseness  . stress test negative  2003,2004,2006  . TUBAL LIGATION    . tubaligation    . uterine prolapse      There were no vitals filed for this visit.  Subjective Assessment - 07/28/19 1106    Subjective  I feel great.  Over  the weekend I had a pain in my left breast but it went away.    Pertinent History  CVA, TIA, anxiety, PVD, DDD, LBP    How long can you walk comfortably?  the loop in the cul-de-sac very comfortably    Currently in Pain?  No/denies    Pain Score  0-No pain         [facility] PT Assessment - 07/28/19 0001      Strength   Overall Strength Comments  bil LEs 4 to 4+/5 no pain produced with MMT       Berg Balance Test   Sit to Stand  Able to stand without using hands and stabilize independently    Standing Unsupported  Able to stand safely 2 minutes    Sitting with Back Unsupported but Feet Supported on Floor or Stool  Able to sit safely and securely 2 minutes    Stand to Sit  Sits safely with minimal use of hands    Transfers  Able to transfer safely, minor use of hands    Standing Unsupported with Eyes Closed  Able to stand 10 seconds safely    Standing Unsupported with Feet Together  Able to place feet together independently and stand 1 minute safely    From Standing, Reach Forward with Outstretched Arm  Can reach confidently >25 cm (10")  [facility] [address] [place], [place], [address] Phone: [phone]   Fax:  [phone]  Name: [name] MRN: [id number] Date of Birth: [birth year]  [facility] [address] [place], [place], [address] Phone: [phone]   Fax:  [phone]  Name: [name] MRN: [id number] Date of Birth: [birth year]  [facility] [address] [place], [place], [address] Phone: [phone]   Fax:  [phone]  Name: [name] MRN: [id number] Date of Birth: [birth year]

## 2019-08-24 ENCOUNTER — Other Ambulatory Visit: Payer: Self-pay | Admitting: Family Medicine

## 2019-08-24 DIAGNOSIS — Z1231 Encounter for screening mammogram for malignant neoplasm of breast: Secondary | ICD-10-CM

## 2019-08-25 ENCOUNTER — Encounter: Payer: PPO | Admitting: Physical Therapy

## 2019-09-02 ENCOUNTER — Encounter: Payer: Self-pay | Admitting: Family Medicine

## 2019-09-02 ENCOUNTER — Ambulatory Visit (INDEPENDENT_AMBULATORY_CARE_PROVIDER_SITE_OTHER): Payer: Medicare Other | Admitting: Family Medicine

## 2019-09-02 ENCOUNTER — Other Ambulatory Visit: Payer: Self-pay

## 2019-09-02 VITALS — BP 134/72 | HR 75 | Temp 97.2°F | Resp 16 | Ht 66.0 in | Wt 159.2 lb

## 2019-09-02 DIAGNOSIS — R0981 Nasal congestion: Secondary | ICD-10-CM | POA: Diagnosis not present

## 2019-09-02 DIAGNOSIS — L84 Corns and callosities: Secondary | ICD-10-CM | POA: Diagnosis not present

## 2019-09-02 DIAGNOSIS — E2839 Other primary ovarian failure: Secondary | ICD-10-CM

## 2019-09-02 DIAGNOSIS — I10 Essential (primary) hypertension: Secondary | ICD-10-CM

## 2019-09-02 DIAGNOSIS — K5909 Other constipation: Secondary | ICD-10-CM | POA: Diagnosis not present

## 2019-09-02 DIAGNOSIS — J309 Allergic rhinitis, unspecified: Secondary | ICD-10-CM | POA: Diagnosis not present

## 2019-09-02 MED ORDER — LUBIPROSTONE 24 MCG PO CAPS: 24.0000 ug | ORAL_CAPSULE | Freq: Two times a day (BID) | ORAL | 1 refills | Status: DC

## 2019-09-02 NOTE — Progress Notes (Signed)
Established Patient Office Visit  Subjective:  Patient ID: [name], female    DOB: 07/24/1941  Age: 79 y.o. MRN: [id number]  CC:  Chief Complaint  Patient presents with  . Follow-up    fall w/ some head pain/night allergies per pt she wakes up with mucus drainage on her pillow- onset 04/2019 seen for it while in [place] and given med for it but it didn't help.  Pt c/o cp when she strains to have a bm and calluses she has on her feet.    HPI [name] presents for   Chronic Constipation She reports that she takes an over the counter stool laxative and she has to take magnesium citrate for her chronic constipation. She states that she has not seen her Gastroenterologist for a long time.  She states that she has chronic constipation.   Hypertension: Patient here for follow-up of elevated blood pressure. She is exercising and is adherent to low salt diet.  Blood pressure is well controlled at home. Cardiac symptoms none. Patient denies chest pain, chest pressure/discomfort, claudication, dyspnea, exertional chest pressure/discomfort and fatigue.  Cardiovascular risk factors: hypertension. Use of agents associated with hypertension: none. History of target organ damage: none. BP Readings from Last 3 Encounters:  09/02/19 134/72  06/05/19 136/83  05/25/19 (!) 155/75     Past Medical History:  Diagnosis Date  . Acute sinus infection 01/15/2011  . ANXIETY 10/03/2009  . BARRETTS ESOPHAGUS 10/03/2009  . CAROTID ARTERY DISEASE 08/31/2009  . COLONIC POLYPS, HX OF 08/29/2009  . CONSTIPATION, CHRONIC 10/03/2009  . DEPRESSION 10/03/2009  . DISC DISEASE, CERVICAL 08/29/2009  . DISC DISEASE, LUMBAR 08/29/2009  . Eustachian tube dysfunction 01/15/2011  . GERD 08/29/2009  . HYPERLIPIDEMIA 08/29/2009  . Impaired glucose tolerance 01/14/2011  . LEG PAIN, BILATERAL 08/29/2009  . LOW BACK PAIN, CHRONIC 11/22/2009  . PERIPHERAL NEUROPATHY 10/03/2009  . PERIPHERAL VASCULAR DISEASE 08/29/2009  . POSITIVE PPD  10/03/2009  . Preventative health care 01/14/2011  . Stroke (HCC)   . Thyroid nodule 08/09/2011  . TIA 11/22/2009  . TRANSIENT ISCHEMIC ATTACK, HX OF 08/29/2009  . URETHRAL STRICTURE 10/03/2009  . VOCAL CORD POLYP, HX OF 10/03/2009  . WRIST PAIN, BILATERAL 05/25/2010    Past Surgical History:  Procedure Laterality Date  . hx of right ankle fracture    . s/p vocal cord polyps  1984   chronic hoarseness  . stress test negative  2003,2004,2006  . TUBAL LIGATION    . tubaligation    . uterine prolapse      Family History  Problem Relation Age of Onset  . Stroke Mother   . Heart disease Mother   . Hypertension Mother   . Diabetes Mother   . Multiple myeloma Sister   . Cancer Other        lung    Social History   Socioeconomic History  . Marital status: Widowed    Spouse name: Not on file  . Number of children: 3  . Years of education: Not on file  . Highest education level: Not on file  Occupational History  . 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 Smoking status: Never Smoker  . Smokeless tobacco: Never Used  Substance and Sexual Activity  . Alcohol use: No  . Drug use: No  . Sexual activity: Not Currently  Other Topics Concern  . Not on file  Social History Narrative   Husband with  dementia   Social Determinants of Health   Financial Resource Strain:   . Difficulty of Paying Living Expenses: Not on file  Food Insecurity:   . Worried About Programme researcher, broadcasting/film/video in the Last Year: Not on file  . Ran Out of Food in the Last Year: Not on file  Transportation Needs:   . Lack of Transportation (Medical): Not on file  . Lack of Transportation (Non-Medical): Not on file  Physical Activity:   . Days of Exercise per Week: Not on file  . Minutes of Exercise per Session: Not on file  Stress:   . Feeling of Stress : Not on file  Social Connections:   . Frequency of Communication with Friends and Family: Not on file  . Frequency of  Social Gatherings with Friends and Family: Not on file  . Attends Religious Services: Not on file  . Active Member of Clubs or Organizations: Not on file  . Attends Banker Meetings: Not on file  . Marital Status: Not on file  Intimate Partner Violence:   . Fear of Current or Ex-Partner: Not on file  . Emotionally Abused: Not on file  . Physically Abused: Not on file  . Sexually Abused: Not on file    Outpatient Medications Prior to Visit  Medication Sig Dispense Refill  . acetaminophen (TYLENOL) 325 MG tablet Take 650 mg by mouth every 6 (six) hours as needed for mild pain or headache.    . Ascorbic Acid (VITAMIN C PO) Take 1 tablet by mouth daily.    . clopidogrel (PLAVIX) 75 MG tablet Take 75 mg by mouth daily.    . Coenzyme Q10 10 MG capsule Take 200 mg by mouth daily.    . Cyanocobalamin (VITAMIN B-12 PO) Take 1 tablet by mouth daily.    . fluticasone (FLONASE) 50 MCG/ACT nasal spray Place 1 spray into both nostrils 2 (two) times daily.    . Multiple Vitamins-Minerals (MULTIVITAMIN WITH MINERALS) tablet Take 1 tablet by mouth daily.    . Omega-3 Fatty Acids (FISH OIL) 1000 MG CAPS Take 1,000 mg by mouth daily.     . polyethylene glycol powder (GLYCOLAX/MIRALAX) powder Take 17 g by mouth daily. 3350 g 1  . predniSONE (STERAPRED UNI-PAK 21 TAB) 5 MG (21) TBPK tablet Take as directed 21 tablet 0  . rosuvastatin (CRESTOR) 40 MG tablet Take 1 tablet (40 mg total) by mouth daily. 90 tablet 3   No facility-administered medications prior to visit.    Allergies  Allergen Reactions  . Atorvastatin     REACTION: myalgia  . Penicillins Hives and Itching    Has patient had a PCN reaction causing immediate rash, facial/tongue/throat swelling, SOB or lightheadedness with hypotension: no Has patient had a PCN reaction causing severe rash involving mucus membranes or skin necrosis: no Has patient had a PCN reaction that required hospitalization : no Has patient had a PCN  reaction occurring within the last 10 years: no If all of the above answers are "NO", then may proceed with Cephalosporin use.     ROS Review of Systems +nasal congestion, +foot callus     Objective:    Physical Exam  BP 134/72 (BP Location: Left Arm, Patient Position: Sitting, Cuff Size: Normal)   Pulse 75   Temp (!) 97.2 F (36.2 C) (Oral)   Resp 16   Ht 5\' 6"  (1.676 m)   Wt 159 lb 3.2 oz (72.2 kg)   SpO2 98%   BMI  25.70 kg/m  Wt Readings from Last 3 Encounters:  09/02/19 159 lb 3.2 oz (72.2 kg)  06/05/19 159 lb 3.2 oz (72.2 kg)  06/02/19 158 lb (71.7 kg)   Physical Exam  Constitutional: Oriented to person, place, and time. Appears well-developed and well-nourished.  HENT:  Head: Normocephalic and atraumatic.  Eyes: Conjunctivae and EOM are normal.  Cardiovascular: Normal rate, regular rhythm, normal heart sounds and intact distal pulses.  No murmur heard. Pulmonary/Chest: Effort normal and breath sounds normal. No stridor. No respiratory distress. Has no wheezes.  Neurological: Is alert and oriented to person, place, and time.  Skin: Skin is warm. Capillary refill takes less than 2 seconds.  Psychiatric: Has a normal mood and affect. Behavior is normal. Judgment and thought content normal.    Health Maintenance Due  Topic Date Due  . DEXA SCAN  07/13/2004  . PNA vac Low Risk Adult (2 of 2 - PCV13) 07/30/2008    There are no preventive care reminders to display for this patient.  Lab Results  Component Value Date   TSH 0.72 03/12/2018   Lab Results  Component Value Date   WBC 4.3 01/21/2019   HGB 15.5 01/21/2019   HCT 45.7 01/21/2019   MCV 92 01/21/2019   PLT 157 01/21/2019   Lab Results  Component Value Date   NA 145 (H) 01/21/2019   K 4.2 01/21/2019   CO2 23 01/21/2019   GLUCOSE 96 01/21/2019   BUN 10 01/21/2019   CREATININE 1.03 (H) 01/21/2019   BILITOT 0.6 01/21/2019   ALKPHOS 74 01/21/2019   AST 29 01/21/2019   ALT 21 01/21/2019   PROT  6.7 01/21/2019   ALBUMIN 4.3 01/21/2019   CALCIUM 9.8 01/21/2019   ANIONGAP 8 01/08/2017   GFR 86.95 05/14/2011   Lab Results  Component Value Date   CHOL 242 (H) 01/21/2019   Lab Results  Component Value Date   HDL 40 01/21/2019   Lab Results  Component Value Date   LDLCALC 144 (H) 01/21/2019   Lab Results  Component Value Date   TRIG 291 (H) 01/21/2019   Lab Results  Component Value Date   CHOLHDL 6.1 (H) 01/21/2019   Lab Results  Component Value Date   HGBA1C 5.9 03/12/2018      Assessment & Plan:   Problem List Items Addressed This Visit    None    Visit Diagnoses    Chronic constipation    -  Primary Follow up with Dr. Randa Evens   Relevant Medications   lubiprostone (AMITIZA) 24 MCG capsule   Essential hypertension    -  Patient's blood pressure is at goal of 139/89 or less. Condition is stable. Continue current medications and treatment plan. I recommend that you exercise for 30-45 minutes 5 days a week. I also recommend a balanced diet with fruits and vegetables every day, lean meats, and little fried foods. The DASH diet (you can find this online) is a good example of this.    Relevant Orders   CMP14+EGFR   Lipid Panel With LDL/HDL Ratio   Foot callus    -  Advised to follow up with Podiatry   Relevant Orders   Ambulatory referral to Podiatry   Nasal congestion    -     Chronic allergic rhinitis  -  Discussed diet and exercise     Relevant Orders   Ambulatory referral to ENT   Estrogen deficiency    -   Will check  Relevant Orders   DG BONE DENSITY (DXA)      Meds ordered this encounter  Medications  . lubiprostone (AMITIZA) 24 MCG capsule    Sig: Take 1 capsule (24 mcg total) by mouth 2 (two) times daily with a meal.    Dispense:  30 capsule    Refill:  1    Follow-up: No follow-ups on file.    Doristine Bosworth, MD

## 2019-09-02 NOTE — Patient Instructions (Addendum)
1.  Called Dr. Oletta Lamas for an appointment 2.  Start Amitiza twice a day for constipation 3.  With your next mammogram you can get your bone density. 4.  You will get called by the Podiatrist    If you have lab work done today you will be contacted with your lab results within the next 2 weeks.  If you have not heard from [place] then please contact us. The fastest way to get your results is to register for My Chart.   IF you received an x-ray today, you will receive an invoice from [facility]. Please contact [facility] at [phone] with questions or concerns regarding your invoice.   IF you received labwork today, you will receive an invoice from [facility]. Please contact [facility] at [phone] with questions or concerns regarding your invoice.   Our billing staff will not be able to assist you with questions regarding bills from these companies.  You will be contacted with the lab results as soon as they are available. The fastest way to get your results is to activate your My Chart account. Instructions are located on the last page of this paperwork. If you have not heard from [place] regarding the results in 2 weeks, please contact this office.       Bone Density Test The bone density test uses a special type of X-ray to measure the amount of calcium and other minerals in your bones. It can measure bone density in the hip and the spine. The test procedure is similar to having a regular X-ray. This test may also be called:  Bone densitometry.  Bone mineral density test.  Dual-energy X-ray absorptiometry (DEXA). You may have this test to:  Diagnose a condition that causes weak or thin bones (osteoporosis).  Screen you for osteoporosis.  Predict your risk for a broken bone (fracture).  Determine how well your osteoporosis treatment is working. Tell a health care provider about:  Any allergies you have.  All medicines you are taking, including vitamins, herbs,  eye drops, creams, and over-the-counter medicines.  Any problems you or family members have had with anesthetic medicines.  Any blood disorders you have.  Any surgeries you have had.  Any medical conditions you have.  Whether you are pregnant or may be pregnant.  Any medical tests you have had within the past 14 days that used contrast material. What are the risks? Generally, this is a safe procedure. However, it does expose you to a small amount of radiation, which can slightly increase your cancer risk. What happens before the procedure?  Do not take any calcium supplements starting 24 hours before your test.  Remove all metal jewelry, eyeglasses, dental appliances, and any other metal objects. What happens during the procedure?   You will lie down on an exam table. There will be an X-ray generator below you and an imaging device above you.  Other devices, such as boxes or braces, may be used to position your body properly for the scan.  The machine will slowly scan your body. You will need to keep still.  The images will show up on a screen in the room. Images will be examined by a specialist after your test is done. The procedure may vary among health care providers and hospitals. What happens after the procedure?  It is up to you to get your test results. Ask your health care provider, or the department that is doing the test, when your results will be ready. Summary  A bone density test is an imaging test that uses a type of X-ray to measure the amount of calcium and other minerals in your bones.  The test may be used to diagnose or screen you for a condition that causes weak or thin bones (osteoporosis), predict your risk for a broken bone (fracture), or determine how well your osteoporosis treatment is working.  Do not take any calcium supplements starting 24 hours before your test.  Ask your health care provider, or the department that is doing the test, when your  results will be ready. This information is not intended to replace advice given to you by your health care provider. Make sure you discuss any questions you have with your health care provider. Document Revised: 08/01/2017 Document Reviewed: 05/20/2017 Elsevier Patient Education  2020 [employer].

## 2019-09-03 LAB — CMP14+EGFR
ALT: 28 IU/L (ref 0–32)
AST: 32 IU/L (ref 0–40)
Albumin/Globulin Ratio: 2 (ref 1.2–2.2)
Albumin: 4.7 g/dL (ref 3.7–4.7)
Alkaline Phosphatase: 77 IU/L (ref 39–117)
BUN/Creatinine Ratio: 7 — ABNORMAL LOW (ref 12–28)
BUN: 6 mg/dL — ABNORMAL LOW (ref 8–27)
Bilirubin Total: 0.6 mg/dL (ref 0.0–1.2)
CO2: 27 mmol/L (ref 20–29)
Calcium: 9.9 mg/dL (ref 8.7–10.3)
Chloride: 105 mmol/L (ref 96–106)
Creatinine, Ser: 0.9 mg/dL (ref 0.57–1.00)
GFR calc Af Amer: 70 mL/min/{1.73_m2} (ref >59)
GFR calc non Af Amer: 61 mL/min/{1.73_m2} (ref >59)
Globulin, Total: 2.4 g/dL (ref 1.5–4.5)
Glucose: 85 mg/dL (ref 65–99)
Potassium: 3.7 mmol/L (ref 3.5–5.2)
Sodium: 146 mmol/L — ABNORMAL HIGH (ref 134–144)
Total Protein: 7.1 g/dL (ref 6.0–8.5)

## 2019-09-03 LAB — LIPID PANEL WITH LDL/HDL RATIO
Cholesterol, Total: 159 mg/dL (ref 100–199)
HDL: 59 mg/dL (ref >39)
LDL Chol Calc (NIH): 83 mg/dL (ref 0–99)
LDL/HDL Ratio: 1.4 ratio (ref 0.0–3.2)
Triglycerides: 89 mg/dL (ref 0–149)
VLDL Cholesterol Cal: 17 mg/dL (ref 5–40)

## 2019-09-07 ENCOUNTER — Ambulatory Visit: Payer: Medicare Other | Attending: Internal Medicine

## 2019-09-07 DIAGNOSIS — Z20822 Contact with and (suspected) exposure to covid-19: Secondary | ICD-10-CM

## 2019-09-08 LAB — NOVEL CORONAVIRUS, NAA: SARS-CoV-2, NAA: NOT DETECTED

## 2019-09-09 ENCOUNTER — Telehealth: Payer: Self-pay | Admitting: Family Medicine

## 2019-09-09 NOTE — Telephone Encounter (Signed)
Negative COVID results given. Patient results "NOT Detected." Caller expressed understanding. ° °

## 2019-10-05 ENCOUNTER — Ambulatory Visit
Admission: RE | Admit: 2019-10-05 | Discharge: 2019-10-05 | Disposition: A | Discharge status: Home / Self Care | Payer: Medicare Other | Source: Ambulatory Visit | Attending: Family Medicine | Admitting: Family Medicine

## 2019-10-05 ENCOUNTER — Other Ambulatory Visit: Payer: Self-pay

## 2019-10-05 DIAGNOSIS — Z1231 Encounter for screening mammogram for malignant neoplasm of breast: Secondary | ICD-10-CM | POA: Diagnosis not present

## 2019-10-06 ENCOUNTER — Other Ambulatory Visit: Payer: Self-pay

## 2019-10-06 ENCOUNTER — Ambulatory Visit (INDEPENDENT_AMBULATORY_CARE_PROVIDER_SITE_OTHER): Payer: Medicare Other | Admitting: Otolaryngology

## 2019-10-06 ENCOUNTER — Encounter (INDEPENDENT_AMBULATORY_CARE_PROVIDER_SITE_OTHER): Payer: Self-pay | Admitting: Otolaryngology

## 2019-10-06 VITALS — Temp 97.9°F

## 2019-10-06 DIAGNOSIS — J310 Chronic rhinitis: Secondary | ICD-10-CM

## 2019-10-06 DIAGNOSIS — K219 Gastro-esophageal reflux disease without esophagitis: Secondary | ICD-10-CM | POA: Diagnosis not present

## 2019-10-06 NOTE — Progress Notes (Signed)
HPI: [name] is a 79 y.o. female who presents is referred by her PCP for evaluation of throat symptoms.  Patient describes a lot of mucus stuck in her throat.  She describes some postnasal drainage but her main complaint seems to be more excessive mucus in her throat.  She apparently has seen an allergist.  She has been using Flonase. She has complained of this problem now for about a year and a half ever since she took a trip to [place] for several weeks back in October 2019. She also relates that she has had previous history of vocal cord nodules that were removed a number of years ago. She denies sore throat. She is having no voice problems today..  Past Medical History:  Diagnosis Date  . Acute sinus infection 01/15/2011  . ANXIETY 10/03/2009  . BARRETTS ESOPHAGUS 10/03/2009  . CAROTID ARTERY DISEASE 08/31/2009  . COLONIC POLYPS, HX OF 08/29/2009  . CONSTIPATION, CHRONIC 10/03/2009  . DEPRESSION 10/03/2009  . [place] DISEASE, CERVICAL 08/29/2009  . [place] DISEASE, LUMBAR 08/29/2009  . Eustachian tube dysfunction 01/15/2011  . GERD 08/29/2009  . HYPERLIPIDEMIA 08/29/2009  . Impaired glucose tolerance 01/14/2011  . LEG PAIN, BILATERAL 08/29/2009  . LOW BACK PAIN, CHRONIC 11/22/2009  . PERIPHERAL NEUROPATHY 10/03/2009  . PERIPHERAL VASCULAR DISEASE 08/29/2009  . POSITIVE PPD 10/03/2009  . Preventative health care 01/14/2011  . Stroke ([place])   . Thyroid nodule 08/09/2011  . TIA 11/22/2009  . TRANSIENT ISCHEMIC ATTACK, HX OF 08/29/2009  . URETHRAL STRICTURE 10/03/2009  . VOCAL CORD POLYP, HX OF 10/03/2009  . WRIST PAIN, BILATERAL 05/25/2010   Past Surgical History:  Procedure Laterality Date  . hx of right ankle fracture    . s/p vocal cord polyps  1984   chronic hoarseness  . stress test negative  2003,2004,2006  . TUBAL LIGATION    . tubaligation    . uterine prolapse     Social History   Socioeconomic History  . Marital status: Widowed    Spouse name: Not on file  . Number of children: 3  .  Years of education: Not on file  . Highest education level: Not on file  Occupational History  . 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 Smoking status: Never Smoker  . Smokeless tobacco: Never Used  Substance and Sexual Activity  . Alcohol use: No  . Drug use: No  . Sexual activity: Not Currently  Other Topics Concern  . Not on file  Social History Narrative   Husband with dementia   Social Determinants of Health   Financial Resource Strain:   . Difficulty of Paying Living Expenses: Not on file  Food Insecurity:   . Worried About Charity fundraiser in the Last Year: Not on file  . Ran Out of Food in the Last Year: Not on file  Transportation Needs:   . Lack of Transportation (Medical): Not on file  . Lack of Transportation (Non-Medical): Not on file  Physical Activity:   . Days of Exercise per Week: Not on file  . Minutes of Exercise per Session: Not on file  Stress:   . Feeling of Stress : Not on file  Social Connections:   . Frequency of Communication with Friends and Family: Not on file  . Frequency of Social Gatherings with Friends and Family: Not on file  . Attends Religious Services: Not on file  . Active Member of Clubs  or Organizations: Not on file  . Attends Archivist Meetings: Not on file  . Marital Status: Not on file   Family History  Problem Relation Age of Onset  . Stroke Mother   . Heart disease Mother   . Hypertension Mother   . Diabetes Mother   . Multiple myeloma Sister   . Cancer Other        lung   Allergies  Allergen Reactions  . Atorvastatin     REACTION: myalgia  . Penicillins Hives and Itching    Has patient had a PCN reaction causing immediate rash, facial/tongue/throat swelling, SOB or lightheadedness with hypotension: no Has patient had a PCN reaction causing severe rash involving mucus membranes or skin necrosis: no Has patient had a PCN reaction that required hospitalization :  no Has patient had a PCN reaction occurring within the last 10 years: no If all of the above answers are "NO", then may proceed with Cephalosporin use.    Prior to Admission medications   Medication Sig Start Date End Date Taking? Authorizing Provider  acetaminophen (TYLENOL) 325 MG tablet Take 650 mg by mouth every 6 (six) hours as needed for mild pain or headache.   Yes [provider]  Ascorbic Acid (VITAMIN C PO) Take 1 tablet by mouth daily.   Yes [provider]  clopidogrel (PLAVIX) 75 MG tablet Take 75 mg by mouth daily.   Yes [provider]  Coenzyme Q10 10 MG capsule Take 200 mg by mouth daily.   Yes [provider]  Cyanocobalamin (VITAMIN B-12 PO) Take 1 tablet by mouth daily.   Yes [provider]  fluticasone (FLONASE) 50 MCG/ACT nasal spray Place 1 spray into both nostrils 2 (two) times daily. 04/26/19  Yes [provider]  lubiprostone (AMITIZA) 24 MCG capsule Take 1 capsule (24 mcg total) by mouth 2 (two) times daily with a meal. 09/02/19  Yes Stallings, Zoe A, MD  Multiple Vitamins-Minerals (MULTIVITAMIN WITH MINERALS) tablet Take 1 tablet by mouth daily.   Yes [provider]  Omega-3 Fatty Acids (FISH OIL) 1000 MG CAPS Take 1,000 mg by mouth daily.    Yes [provider]  polyethylene glycol powder (GLYCOLAX/MIRALAX) powder Take 17 g by mouth daily. 04/15/18  Yes Sagardia, Ines Bloomer, MD  predniSONE (STERAPRED UNI-PAK 21 TAB) 5 MG (21) TBPK tablet Take as directed 06/02/19  Yes Dwana Melena L, PA-C  rosuvastatin (CRESTOR) 40 MG tablet Take 1 tablet (40 mg total) by mouth daily. 01/21/19  Yes Delia Chimes A, MD     Positive ROS: Otherwise negative  All other systems have been reviewed and were otherwise negative with the exception of those mentioned in the HPI and as above.  Physical Exam: Constitutional: Alert, well-appearing, no acute distress Ears: External ears without lesions or tenderness. Ear  canals are clear bilaterally with intact, clear TMs.  Nasal: External nose without lesions. Septum relatively midline with mild rhinitis..  Both middle meatus regions are clear.  No signs of infection.  No polyps. Oral: Lips and gums without lesions. Tongue and palate mucosa without lesions. Posterior oropharynx clear. Fiberoptic laryngoscopy was performed through the left nostril.  Posterior nasal cavity was clear as was the nasopharynx.  Base of tongue, vallecula and epiglottis were normal.  Vocal cords were clear bilaterally with normal vocal cord mobility.  No vocal cord nodules or polyps noted.  She had mild arytenoid edema consistent with probable laryngeal pharyngeal reflux but no mucosal abnormalities noted.  The fiberoptic laryngoscope was passed through the upper esophageal sphincter without difficulty and the upper cervical esophagus was clear. Neck: No palpable adenopathy or masses noted in the neck.  No palpable thyroid nodules. Respiratory: Breathing comfortably  Skin: No facial/neck lesions or rash noted.  Laryngoscopy  Date/Time: 10/06/2019 5:02 PM Performed by: Rozetta Nunnery, MD Authorized by: Rozetta Nunnery, MD   Consent:    Consent obtained:  Verbal   Consent given by:  Patient Procedure details:    Indications: direct visualization of the upper aerodigestive tract     Medication:  Afrin   Instrument: flexible fiberoptic laryngoscope     Scope location: left nare   Sinus:    Left nasopharynx: normal   Mouth:    Oropharynx: normal     Vallecula: normal     Base of tongue: normal     Epiglottis: normal   Throat:    Pyriform sinus: normal     True vocal cords: normal   Comments:     Clear upper airway examination.  I passed the BiPAP laryngoscope through the upper esophageal sphincter without difficulty.  The upper cervical esophagus was clear.  Mild arytenoid edema may be indicative of laryngeal pharyngeal reflux.    Assessment: Globus type symptoms  with excessive mucus in her throat I suspect is more related to laryngeal pharyngeal reflux.  Plan: Place her on omeprazole 40 mg daily before dinner for the next 4 weeks. She will continue with use of Flonase 2 sprays each nostril at night. Reassured her of normal upper airway examination with no evidence of recurrent vocal cord nodules.  Radene Journey, MD   CC:

## 2019-10-07 ENCOUNTER — Telehealth (INDEPENDENT_AMBULATORY_CARE_PROVIDER_SITE_OTHER): Payer: Self-pay

## 2019-10-07 ENCOUNTER — Telehealth: Payer: Self-pay | Admitting: Family Medicine

## 2019-10-07 NOTE — Telephone Encounter (Signed)
Pt states that she saw allergist yesterday and he RXd her Omeprazole 40mg , but pharmacist told her to check with PCP because she is on a blood thinner and she shouldn't take this without PCP approval.

## 2019-10-07 NOTE — Telephone Encounter (Signed)
Patient wanted to know could she take Omeprazole while on an blood thinner.  Please Advise.

## 2019-10-08 ENCOUNTER — Telehealth: Payer: Self-pay

## 2019-10-08 MED ORDER — FAMOTIDINE 20 MG PO TABS: 40.0000 mg | ORAL_TABLET | Freq: Two times a day (BID) | ORAL | 3 refills | Status: DC

## 2019-10-08 NOTE — Telephone Encounter (Signed)
LVM for pt to let her know what Dr Creta Levin said.   She cannot take the omeprazole with Plavix.  She can take Pepcid AC instead. I sent in 20mg  twice daily to [facility]

## 2019-10-08 NOTE — Telephone Encounter (Signed)
LVM from Dr Creta Levin to pt stating that

## 2019-10-08 NOTE — Telephone Encounter (Signed)
She cannot take the omeprazole with Plavix.  She can take Pepcid AC instead. I sent in 20mg  twice daily to [facility].

## 2019-10-20 ENCOUNTER — Ambulatory Visit: Payer: Medicare Other | Admitting: Podiatry

## 2019-10-20 ENCOUNTER — Other Ambulatory Visit: Payer: Self-pay

## 2019-10-20 ENCOUNTER — Encounter: Payer: Self-pay | Admitting: Podiatry

## 2019-10-20 VITALS — BP 129/71 | HR 68 | Temp 97.2°F

## 2019-10-20 DIAGNOSIS — M79672 Pain in left foot: Secondary | ICD-10-CM

## 2019-10-20 DIAGNOSIS — L84 Corns and callosities: Secondary | ICD-10-CM

## 2019-10-20 DIAGNOSIS — M79671 Pain in right foot: Secondary | ICD-10-CM | POA: Diagnosis not present

## 2019-10-20 NOTE — Patient Instructions (Signed)
Corns and Calluses Corns are small areas of thickened skin that occur on the top, sides, or tip of a toe. They contain a cone-shaped core with a point that can press on a nerve below. This causes pain.  Calluses are areas of thickened skin that can occur anywhere on the body, including the hands, fingers, palms, soles of the feet, and heels. Calluses are usually larger than corns. What are the causes? Corns and calluses are caused by rubbing (friction) or pressure, such as from shoes that are too tight or do not fit properly. What increases the risk? Corns are more likely to develop in people who have misshapen toes (toe deformities), such as hammer toes. Calluses can occur with friction to any area of the skin. They are more likely to develop in people who:  Work with their hands.  Wear shoes that fit poorly, are too tight, or are high-heeled.  Have toe deformities. What are the signs or symptoms? Symptoms of a corn or callus include:  A hard growth on the skin.  Pain or tenderness under the skin.  Redness and swelling.  Increased discomfort while wearing tight-fitting shoes, if your feet are affected. If a corn or callus becomes infected, symptoms may include:  Redness and swelling that gets worse.  Pain.  Fluid, blood, or pus draining from the corn or callus. How is this diagnosed? Corns and calluses may be diagnosed based on your symptoms, your medical history, and a physical exam. How is this treated? Treatment for corns and calluses may include:  Removing the cause of the friction or pressure. This may involve: ? Changing your shoes. ? Wearing shoe inserts (orthotics) or other protective layers in your shoes, such as a corn pad. ? Wearing gloves.  Applying medicine to the skin (topical medicine) to help soften skin in the hardened, thickened areas.  Removing layers of dead skin with a file to reduce the size of the corn or callus.  Removing the corn or callus with a  scalpel or laser.  Taking antibiotic medicines, if your corn or callus is infected.  Having surgery, if a toe deformity is the cause. Follow these instructions at home:   Take over-the-counter and prescription medicines only as told by your health care provider.  If you were prescribed an antibiotic, take it as told by your health care provider. Do not stop taking it even if your condition starts to improve.  Wear shoes that fit well. Avoid wearing high-heeled shoes and shoes that are too tight or too loose.  Wear any padding, protective layers, gloves, or orthotics as told by your health care provider.  Soak your hands or feet and then use a file or pumice stone to soften your corn or callus. Do this as told by your health care provider.  Check your corn or callus every day for symptoms of infection. Contact a health care provider if you:  Notice that your symptoms do not improve with treatment.  Have redness or swelling that gets worse.  Notice that your corn or callus becomes painful.  Have fluid, blood, or pus coming from your corn or callus.  Have new symptoms. Summary  Corns are small areas of thickened skin that occur on the top, sides, or tip of a toe.  Calluses are areas of thickened skin that can occur anywhere on the body, including the hands, fingers, palms, and soles of the feet. Calluses are usually larger than corns.  Corns and calluses are caused by   rubbing (friction) or pressure, such as from shoes that are too tight or do not fit properly.  Treatment may include wearing any padding, protective layers, gloves, or orthotics as told by your health care provider. This information is not intended to replace advice given to you by your health care provider. Make sure you discuss any questions you have with your health care provider. Document Revised: 11/05/2018 Document Reviewed: 05/29/2017 Elsevier Patient Education  2020 [employer]  Peripheral  Neuropathy Peripheral neuropathy is a type of nerve damage. It affects nerves that carry signals between the spinal cord and the arms, legs, and the rest of the body (peripheral nerves). It does not affect nerves in the spinal cord or brain. In peripheral neuropathy, one nerve or a group of nerves may be damaged. Peripheral neuropathy is a broad category that includes many specific nerve disorders, like diabetic neuropathy, hereditary neuropathy, and carpal tunnel syndrome. What are the causes? This condition may be caused by:  Diabetes. This is the most common cause of peripheral neuropathy.  Nerve injury.  Pressure or stress on a nerve that lasts a long time.  Lack (deficiency) of B vitamins. This can result from alcoholism, poor diet, or a restricted diet.  Infections.  Autoimmune diseases, such as rheumatoid arthritis and systemic lupus erythematosus.  Nerve diseases that are passed from parent to child (inherited).  Some medicines, such as cancer medicines (chemotherapy).  Poisonous (toxic) substances, such as lead and mercury.  Too little blood flowing to the legs.  Kidney disease.  Thyroid disease. In some cases, the cause of this condition is not known. What are the signs or symptoms? Symptoms of this condition depend on which of your nerves is damaged. Common symptoms include:  Loss of feeling (numbness) in the feet, hands, or both.  Tingling in the feet, hands, or both.  Burning pain.  Very sensitive skin.  Weakness.  Not being able to move a part of the body (paralysis).  Muscle twitching.  Clumsiness or poor coordination.  Loss of balance.  Not being able to control your bladder.  Feeling dizzy.  Sexual problems. How is this diagnosed? Diagnosing and finding the cause of peripheral neuropathy can be difficult. Your health care provider will take your medical history and do a physical exam. A neurological exam will also be done. This involves  checking things that are affected by your brain, spinal cord, and nerves (nervous system). For example, your health care provider will check your reflexes, how you move, and what you can feel. You may have other tests, such as:  Blood tests.  Electromyogram (EMG) and nerve conduction tests. These tests check nerve function and how well the nerves are controlling the muscles.  Imaging tests, such as CT scans or MRI to rule out other causes of your symptoms.  Removing a small piece of nerve to be examined in a lab (nerve biopsy). This is rare.  Removing and examining a small amount of the fluid that surrounds the brain and spinal cord (lumbar puncture). This is rare. How is this treated? Treatment for this condition may involve:  Treating the underlying cause of the neuropathy, such as diabetes, kidney disease, or vitamin deficiencies.  Stopping medicines that can cause neuropathy, such as chemotherapy.  Medicine to relieve pain. Medicines may include: ? Prescription or over-the-counter pain medicine. ? Antiseizure medicine. ? Antidepressants. ? Pain-relieving patches that are applied to painful areas of skin.  Surgery to relieve pressure on a nerve or to destroy a   nerve that is causing pain.  Physical therapy to help improve movement and balance.  Devices to help you move around (assistive devices). Follow these instructions at home: Medicines  Take over-the-counter and prescription medicines only as told by your health care provider. Do not take any other medicines without first asking your health care provider.  Do not drive or use heavy machinery while taking prescription pain medicine. Lifestyle   Do not use any products that contain nicotine or tobacco, such as cigarettes and e-cigarettes. Smoking keeps blood from reaching damaged nerves. If you need help quitting, ask your health care provider.  Avoid or limit alcohol. Too much alcohol can cause a vitamin B deficiency,  and vitamin B is needed for healthy nerves.  Eat a healthy diet. This includes: ? Eating foods that are high in fiber, such as fresh fruits and vegetables, whole grains, and beans. ? Limiting foods that are high in fat and processed sugars, such as fried or sweet foods. General instructions   If you have diabetes, work closely with your health care provider to keep your blood sugar under control.  If you have numbness in your feet: ? Check every day for signs of injury or infection. Watch for redness, warmth, and swelling. ? Wear padded socks and comfortable shoes. These help protect your feet.  Develop a good support system. Living with peripheral neuropathy can be stressful. Consider talking with a mental health specialist or joining a support group.  Use assistive devices and attend physical therapy as told by your health care provider. This may include using a walker or a cane.  Keep all follow-up visits as told by your health care provider. This is important. Contact a health care provider if:  You have new signs or symptoms of peripheral neuropathy.  You are struggling emotionally from dealing with peripheral neuropathy.  Your pain is not well-controlled. Get help right away if:  You have an injury or infection that is not healing normally.  You develop new weakness in an arm or leg.  You fall frequently. Summary  Peripheral neuropathy is when the nerves in the arms, or legs are damaged, resulting in numbness, weakness, or pain.  There are many causes of peripheral neuropathy, including diabetes, pinched nerves, vitamin deficiencies, autoimmune disease, and hereditary conditions.  Diagnosing and finding the cause of peripheral neuropathy can be difficult. Your health care provider will take your medical history, do a physical exam, and do tests, including blood tests and nerve function tests.  Treatment involves treating the underlying cause of the neuropathy and taking  medicines to help control pain. Physical therapy and assistive devices may also help. This information is not intended to replace advice given to you by your health care provider. Make sure you discuss any questions you have with your health care provider. Document Revised: 06/28/2017 Document Reviewed: 09/24/2016 Elsevier Patient Education  2020 [employer]  

## 2019-10-25 NOTE — Progress Notes (Signed)
Subjective: [name] presents today referred by Forrest Moron, MD for complaint of painful plantar lesions b/l for the past 5 years.  Pain prevent comfortable ambulation. Aggravating factor is weightbearing with or without shoegear.   She has treated with pedicures and pumice stone in the past.  Past Medical History:  Diagnosis Date  . Acute sinus infection 01/15/2011  . ANXIETY 10/03/2009  . BARRETTS ESOPHAGUS 10/03/2009  . CAROTID ARTERY DISEASE 08/31/2009  . COLONIC POLYPS, HX OF 08/29/2009  . CONSTIPATION, CHRONIC 10/03/2009  . DEPRESSION 10/03/2009  . [place] DISEASE, CERVICAL 08/29/2009  . [place] DISEASE, LUMBAR 08/29/2009  . Eustachian tube dysfunction 01/15/2011  . GERD 08/29/2009  . HYPERLIPIDEMIA 08/29/2009  . Impaired glucose tolerance 01/14/2011  . LEG PAIN, BILATERAL 08/29/2009  . LOW BACK PAIN, CHRONIC 11/22/2009  . PERIPHERAL NEUROPATHY 10/03/2009  . PERIPHERAL VASCULAR DISEASE 08/29/2009  . POSITIVE PPD 10/03/2009  . Preventative health care 01/14/2011  . Stroke ([place])   . Thyroid nodule 08/09/2011  . TIA 11/22/2009  . TRANSIENT ISCHEMIC ATTACK, HX OF 08/29/2009  . URETHRAL STRICTURE 10/03/2009  . VOCAL CORD POLYP, HX OF 10/03/2009  . WRIST PAIN, BILATERAL 05/25/2010     Patient Active Problem List   Diagnosis Date Noted  . Dizziness 08/14/2017  . Chronic swimmer's ear of both sides 12/13/2016  . Acute left hemiparesis ([place])   . TIA (transient ischemic attack) 06/01/2016  . CVA (cerebral vascular accident) ([place]) 06/01/2016  . Transient cerebral ischemia   . Pelvic prolapse 11/03/2014  . Cystocele 09/29/2014  . SUI (stress urinary incontinence, female) 09/29/2014  . Complicated migraine 38/88/2800  . Multinodular goiter 08/09/2011  . Lumbar pain with radiation down right leg 04/11/2011  . Rash 04/11/2011  . Impaired glucose tolerance 01/14/2011  . Preventative health care 01/14/2011  . WRIST PAIN, BILATERAL 05/25/2010  . TIA 11/22/2009  . LOW BACK PAIN, CHRONIC 11/22/2009  .  ANXIETY 10/03/2009  . DEPRESSION 10/03/2009  . PERIPHERAL NEUROPATHY 10/03/2009  . BARRETTS ESOPHAGUS 10/03/2009  . CONSTIPATION, CHRONIC 10/03/2009  . URETHRAL STRICTURE 10/03/2009  . POSITIVE PPD 10/03/2009  . VOCAL CORD POLYP, HX OF 10/03/2009  . Carotid artery disease ([place]) 08/31/2009  . Hyperlipemia 08/29/2009  . PERIPHERAL VASCULAR DISEASE 08/29/2009  . GERD 08/29/2009  . [place] DISEASE, CERVICAL 08/29/2009  . [place] DISEASE, LUMBAR 08/29/2009  . LEG PAIN, BILATERAL 08/29/2009  . TRANSIENT ISCHEMIC ATTACK, HX OF 08/29/2009  . COLONIC POLYPS, HX OF 08/29/2009     Past Surgical History:  Procedure Laterality Date  . hx of right ankle fracture    . s/p vocal cord polyps  1984   chronic hoarseness  . stress test negative  2003,2004,2006  . TUBAL LIGATION    . tubaligation    . uterine prolapse       Current Outpatient Medications on File Prior to Visit  Medication Sig Dispense Refill  . acetaminophen (TYLENOL) 325 MG tablet Take 650 mg by mouth every 6 (six) hours as needed for mild pain or headache.    . Ascorbic Acid (VITAMIN C PO) Take 1 tablet by mouth daily.    . Calcium Carbonate-Vitamin D 600-200 MG-UNIT TABS Take by mouth.    . clopidogrel (PLAVIX) 75 MG tablet Take 75 mg by mouth daily.    . Coenzyme Q10 10 MG capsule Take 200 mg by mouth daily.    . Cyanocobalamin (VITAMIN B-12 PO) Take 1 tablet by mouth daily.    . famotidine (PEPCID) 20 MG tablet Take  2 tablets (40 mg total) by mouth 2 (two) times daily. 180 tablet 3  . FLUAD QUADRIVALENT 0.5 ML injection     . fluticasone (FLONASE) 50 MCG/ACT nasal spray Place 1 spray into both nostrils 2 (two) times daily.    Marland Kitchen lubiprostone (AMITIZA) 24 MCG capsule Take 1 capsule (24 mcg total) by mouth 2 (two) times daily with a meal. 30 capsule 1  . Multiple Vitamins-Minerals (MULTIVITAMIN WITH MINERALS) tablet Take 1 tablet by mouth daily.    . Omega-3 Fatty Acids (FISH OIL) 1000 MG CAPS Take 1,000 mg by mouth daily.     Marland Kitchen  omeprazole (PRILOSEC) 40 MG capsule Take 40 mg by mouth at bedtime.    . polyethylene glycol powder (GLYCOLAX/MIRALAX) powder Take 17 g by mouth daily. 3350 g 1  . predniSONE (STERAPRED UNI-PAK 21 TAB) 5 MG (21) TBPK tablet Take as directed 21 tablet 0  . rosuvastatin (CRESTOR) 40 MG tablet Take 1 tablet (40 mg total) by mouth daily. 90 tablet 3   No current facility-administered medications on file prior to visit.     Allergies  Allergen Reactions  . Atorvastatin     REACTION: myalgia  . Penicillins Hives and Itching    Has patient had a PCN reaction causing immediate rash, facial/tongue/throat swelling, SOB or lightheadedness with hypotension: no Has patient had a PCN reaction causing severe rash involving mucus membranes or skin necrosis: no Has patient had a PCN reaction that required hospitalization : no Has patient had a PCN reaction occurring within the last 10 years: no If all of the above answers are "NO", then may proceed with Cephalosporin use.      Social History   Occupational History  . 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 Smoking status: Never Smoker  . Smokeless tobacco: Never Used  Substance and Sexual Activity  . Alcohol use: No  . Drug use: No  . Sexual activity: Not Currently     Family History  Problem Relation Age of Onset  . Stroke Mother   . Heart disease Mother   . Hypertension Mother   . Diabetes Mother   . Multiple myeloma Sister   . Cancer Other        lung     Immunization History  Administered Date(s) Administered  . HPV Bivalent 06/09/2018  . Influenza Whole 03/30/2009, 05/06/2010  . Influenza, High Dose Seasonal PF 06/16/2014  . Pneumococcal Polysaccharide-23 07/31/2007  . Td 02/27/2002, 05/25/2010     Objective: Vitals:   10/20/19 1516  BP: 129/71  Pulse: 68  Temp: (!) 97.2 F (36.2 C)    Pt is an 79 yo AAF, WD, WN in NAD.  AAO x 3.   Vascular Examination:  Capillary refill  time to digits immediate b/l. Palpable DP pulses b/l. Palpable PT pulses b/l. Pedal hair sparse b/l. Skin temperature gradient within normal limits b/l. No edema noted b/l.  Dermatological Examination: Pedal skin with normal turgor, texture and tone bilaterally. No open wounds bilaterally. No interdigital macerations bilaterally. Hyperkeratotic lesion(s) submet head 2 left foot, submet head 3 left foot, submet head 3 right foot, submet head 4 left foot and submet head 5 right foot.  No erythema, no edema, no drainage, no flocculence.  Musculoskeletal: Normal muscle strength 5/5 to all lower extremity muscle groups bilaterally, no gross bony deformities bilaterally, no pain crepitus or joint limitation noted with ROM b/l and patient ambulates independent of any assistive aids  Neurological: Protective sensation intact 5/5 intact bilaterally with 10g monofilament b/l Vibratory sensation intact b/l Proprioception intact bilaterally  Assessment: 1. Callus   2. Pain in both feet     Plan: -Discussed treatment for calluses. Avoid OTC chemical removers. She may use OTC cushions for calluses between visits. Sample of Foot [facility]acle dispensed. She may use with pumice stone periodically between visits. -Callus(es) submet head 2 left foot, submet head 3 left foot, submet head 3 right foot, submet head 4 left foot and submet head 5 right foot were debrided without complication or incident. Total number debrided =5. -Patient to continue soft, supportive shoe gear daily. -Patient to report any pedal injuries to medical professional immediately. -Patient/POA to call should there be question/concern in the interim.  Return in 3 months (on 01/20/2020) for callus trim.

## 2019-11-04 ENCOUNTER — Encounter: Payer: Self-pay | Admitting: Family Medicine

## 2019-11-04 ENCOUNTER — Ambulatory Visit (INDEPENDENT_AMBULATORY_CARE_PROVIDER_SITE_OTHER): Payer: Medicare Other | Admitting: Family Medicine

## 2019-11-04 ENCOUNTER — Other Ambulatory Visit: Payer: Self-pay

## 2019-11-04 VITALS — BP 143/78 | HR 56 | Temp 98.0°F | Resp 16 | Ht 66.0 in | Wt 156.0 lb

## 2019-11-04 DIAGNOSIS — Z0001 Encounter for general adult medical examination with abnormal findings: Secondary | ICD-10-CM | POA: Diagnosis not present

## 2019-11-04 DIAGNOSIS — R296 Repeated falls: Secondary | ICD-10-CM

## 2019-11-04 DIAGNOSIS — Z23 Encounter for immunization: Secondary | ICD-10-CM

## 2019-11-04 DIAGNOSIS — Z1329 Encounter for screening for other suspected endocrine disorder: Secondary | ICD-10-CM | POA: Diagnosis not present

## 2019-11-04 DIAGNOSIS — G629 Polyneuropathy, unspecified: Secondary | ICD-10-CM

## 2019-11-04 DIAGNOSIS — R519 Headache, unspecified: Secondary | ICD-10-CM | POA: Diagnosis not present

## 2019-11-04 DIAGNOSIS — Z0000 Encounter for general adult medical examination without abnormal findings: Secondary | ICD-10-CM

## 2019-11-04 DIAGNOSIS — Z13220 Encounter for screening for lipoid disorders: Secondary | ICD-10-CM | POA: Diagnosis not present

## 2019-11-04 NOTE — Progress Notes (Signed)
QUICK REFERENCE INFORMATION: The ABCs of Providing the Annual Wellness Visit  CMS.gov Medicare Learning Network  [employer] Wellness Visit  Subjective:   [name] is a 79 y.o. Female who presents for an Annual Wellness Visit.  Additional concern today  She states that she fell in September and had a contusion She reports that she gets a knot on her right forehead She states that the knot pops up and she notices that she has pain in her head, she feels pain radiate down her neck to her arms.  She denies vision changes, numbness or tingling.  She feels confused while the knot is present.   Patient Active Problem List   Diagnosis Date Noted  . Dizziness 08/14/2017  . Chronic swimmer's ear of both sides 12/13/2016  . Acute left hemiparesis ([place])   . TIA (transient ischemic attack) 06/01/2016  . CVA (cerebral vascular accident) ([place]) 06/01/2016  . Transient cerebral ischemia   . Pelvic prolapse 11/03/2014  . Cystocele 09/29/2014  . SUI (stress urinary incontinence, female) 09/29/2014  . Complicated migraine 76/73/4193  . Multinodular goiter 08/09/2011  . Lumbar pain with radiation down right leg 04/11/2011  . Rash 04/11/2011  . Impaired glucose tolerance 01/14/2011  . Preventative health care 01/14/2011  . WRIST PAIN, BILATERAL 05/25/2010  . TIA 11/22/2009  . LOW BACK PAIN, CHRONIC 11/22/2009  . ANXIETY 10/03/2009  . DEPRESSION 10/03/2009  . PERIPHERAL NEUROPATHY 10/03/2009  . BARRETTS ESOPHAGUS 10/03/2009  . CONSTIPATION, CHRONIC 10/03/2009  . URETHRAL STRICTURE 10/03/2009  . POSITIVE PPD 10/03/2009  . VOCAL CORD POLYP, HX OF 10/03/2009  . Carotid artery disease ([place]) 08/31/2009  . Hyperlipemia 08/29/2009  . PERIPHERAL VASCULAR DISEASE 08/29/2009  . GERD 08/29/2009  . [place] DISEASE, CERVICAL 08/29/2009  . [place] DISEASE, LUMBAR 08/29/2009  . LEG PAIN, BILATERAL 08/29/2009  . TRANSIENT ISCHEMIC ATTACK, HX OF 08/29/2009  . COLONIC POLYPS, HX OF 08/29/2009     Past Medical History:  Diagnosis Date  . Acute sinus infection 01/15/2011  . ANXIETY 10/03/2009  . BARRETTS ESOPHAGUS 10/03/2009  . CAROTID ARTERY DISEASE 08/31/2009  . COLONIC POLYPS, HX OF 08/29/2009  . CONSTIPATION, CHRONIC 10/03/2009  . DEPRESSION 10/03/2009  . [place] DISEASE, CERVICAL 08/29/2009  . [place] DISEASE, LUMBAR 08/29/2009  . Eustachian tube dysfunction 01/15/2011  . GERD 08/29/2009  . HYPERLIPIDEMIA 08/29/2009  . Impaired glucose tolerance 01/14/2011  . LEG PAIN, BILATERAL 08/29/2009  . LOW BACK PAIN, CHRONIC 11/22/2009  . PERIPHERAL NEUROPATHY 10/03/2009  . PERIPHERAL VASCULAR DISEASE 08/29/2009  . POSITIVE PPD 10/03/2009  . Preventative health care 01/14/2011  . Stroke ([place])   . Thyroid nodule 08/09/2011  . TIA 11/22/2009  . TRANSIENT ISCHEMIC ATTACK, HX OF 08/29/2009  . URETHRAL STRICTURE 10/03/2009  . VOCAL CORD POLYP, HX OF 10/03/2009  . WRIST PAIN, BILATERAL 05/25/2010     Past Surgical History:  Procedure Laterality Date  . hx of right ankle fracture    . s/p vocal cord polyps  1984   chronic hoarseness  . stress test negative  2003,2004,2006  . TUBAL LIGATION    . tubaligation    . uterine prolapse       Outpatient Medications Prior to Visit  Medication Sig Dispense Refill  . acetaminophen (TYLENOL) 325 MG tablet Take 650 mg by mouth every 6 (six) hours as needed for mild pain or headache.    . Ascorbic Acid (VITAMIN C PO) Take 1 tablet by mouth daily.    . Calcium Carbonate-Vitamin D 600-200 MG-UNIT TABS  Take by mouth.    . clopidogrel (PLAVIX) 75 MG tablet Take 75 mg by mouth daily.    . Coenzyme Q10 10 MG capsule Take 200 mg by mouth daily.    . Cyanocobalamin (VITAMIN B-12 PO) Take 1 tablet by mouth daily.    . fluticasone (FLONASE) 50 MCG/ACT nasal spray Place 1 spray into both nostrils 2 (two) times daily.    . Multiple Vitamins-Minerals (MULTIVITAMIN WITH MINERALS) tablet Take 1 tablet by mouth daily.    . Omega-3 Fatty Acids (FISH OIL) 1000 MG CAPS Take 1,000 mg  by mouth daily.     Marland Kitchen omeprazole (PRILOSEC) 40 MG capsule Take 40 mg by mouth at bedtime.    . polyethylene glycol powder (GLYCOLAX/MIRALAX) powder Take 17 g by mouth daily. 3350 g 1  . rosuvastatin (CRESTOR) 40 MG tablet Take 1 tablet (40 mg total) by mouth daily. 90 tablet 3  . famotidine (PEPCID) 20 MG tablet Take 2 tablets (40 mg total) by mouth 2 (two) times daily. (Patient not taking: Reported on 11/04/2019) 180 tablet 3  . FLUAD QUADRIVALENT 0.5 ML injection     . lubiprostone (AMITIZA) 24 MCG capsule Take 1 capsule (24 mcg total) by mouth 2 (two) times daily with a meal. (Patient not taking: Reported on 11/04/2019) 30 capsule 1  . predniSONE (STERAPRED UNI-PAK 21 TAB) 5 MG (21) TBPK tablet Take as directed (Patient not taking: Reported on 11/04/2019) 21 tablet 0   No facility-administered medications prior to visit.    Allergies  Allergen Reactions  . Atorvastatin     REACTION: myalgia  . Penicillins Hives and Itching    Has patient had a PCN reaction causing immediate rash, facial/tongue/throat swelling, SOB or lightheadedness with hypotension: no Has patient had a PCN reaction causing severe rash involving mucus membranes or skin necrosis: no Has patient had a PCN reaction that required hospitalization : no Has patient had a PCN reaction occurring within the last 10 years: no If all of the above answers are "NO", then may proceed with Cephalosporin use.      Family History  Problem Relation Age of Onset  . Stroke Mother   . Heart disease Mother   . Hypertension Mother   . Diabetes Mother   . Multiple myeloma Sister   . Cancer Other        lung     Social History   Socioeconomic History  . Marital status: Widowed    Spouse name: Not on file  . Number of children: 3  . Years of education: Not on file  . Highest education level: Not on file  Occupational History  . 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 Smoking status: Never  Smoker  . Smokeless tobacco: Never Used  Substance and Sexual Activity  . Alcohol use: No  . Drug use: No  . Sexual activity: Not Currently  Other Topics Concern  . Not on file  Social History Narrative   Husband with dementia   Social Determinants of Health   Financial Resource Strain:   . Difficulty of Paying Living Expenses:   Food Insecurity:   . Worried About Charity fundraiser in the Last Year:   . Arboriculturist in the Last Year:   Transportation Needs:   . Film/video editor (Medical):   Marland Kitchen Lack of Transportation (Non-Medical):   Physical Activity:   . Days of Exercise per Week:   .  Minutes of Exercise per Session:   Stress:   . Feeling of Stress :   Social Connections:   . Frequency of Communication with Friends and Family:   . Frequency of Social Gatherings with Friends and Family:   . Attends Religious Services:   . Active Member of Clubs or Organizations:   . Attends Archivist Meetings:   Marland Kitchen Marital Status:       Recent Hospitalizations? Yes  Health Habits: Current exercise activities include: walking Diet: in general, a "healthy" diet    Alcohol intake: none  Health Risk Assessment: The patient has completed a Health Risk Assessment. This has been reveiwed with them and has been scanned into the [place] system as an attached document.  Current Medical Providers and Suppliers: [facility] Patient Care Team: Forrest Moron, MD as PCP - General (Internal Medicine) Future Appointments  Date Time Provider [place]  11/23/2019 11:00 AM GI-BCG DX DEXA 1 GI-BCGDG GI-BREAST CE  01/20/2020  4:00 PM Marzetta Board, DPM [facility]  03/01/2020 10:00 AM Forrest Moron, MD PCP-PCP PEC  05/27/2020  1:30 PM PCV-ECHO/VAS 1 PCV-IMG None  06/03/2020  3:00 PM Adrian Prows, MD PCV-PCV None    Age-appropriate Screening Schedule: The list below includes current immunization status and future screening recommendations based on patient's  age. Orders for these recommended tests are listed in the plan section. The patient has been provided with a written plan. Immunization History  Administered Date(s) Administered  . HPV Bivalent 06/09/2018  . Influenza Whole 03/30/2009, 05/06/2010  . Influenza, High Dose Seasonal PF 06/16/2014  . PFIZER SARS-COV-2 Vaccination 09/04/2019, 09/25/2019  . Pneumococcal Conjugate-13 05/29/2019  . Pneumococcal Polysaccharide-23 07/31/2007  . Td 02/27/2002, 05/25/2010    Health Maintenance reviewed -  yes   Depression Screen-PHQ2/9 completed today  Depression screen [facility] 2/9 09/02/2019 05/25/2019 05/19/2019 01/21/2019 08/25/2018  Decreased Interest 0 0 0 0 0  Down, Depressed, Hopeless 0 0 0 2 0  PHQ - 2 Score 0 0 0 2 0  Altered sleeping - - - 3 -  Tired, decreased energy - - - 3 -  Change in appetite - - - 3 -  Feeling bad or failure about yourself  - - - 0 -  Trouble concentrating - - - 1 -  Moving slowly or fidgety/restless - - - 0 -  Suicidal thoughts - - - 0 -  PHQ-9 Score - - - 12 -  Difficult doing work/chores - - - Not difficult at all -       Depression Severity and Treatment Recommendations:  0-4= None  5-9= Mild / Treatment: Support, educate to call if worse; return in one month  10-14= Moderate / Treatment: Support, watchful waiting; Antidepressant or Psycotherapy  15-19= Moderately severe / Treatment: Antidepressant OR Psychotherapy  >= 20 = Major depression, severe / Antidepressant AND Psychotherapy  Functional Status Survey:   Is the patient deaf or have difficulty hearing?: No Does the patient have difficulty seeing, even when wearing glasses/contacts?: No Does the patient have difficulty concentrating, remembering, or making decisions?: No Does the patient have difficulty walking or climbing stairs?: No Does the patient have difficulty dressing or bathing?: No Does the patient have difficulty doing errands alone such as visiting a doctor's office or shopping?:  No   ROS  Review of Systems  Constitutional: Negative for activity change, appetite change, chills and fever.  HENT: Negative for congestion, nosebleeds, trouble swallowing and voice change.   Respiratory: Negative for  cough, shortness of breath and wheezing.   Gastrointestinal: Negative for diarrhea, nausea and vomiting.  Genitourinary: Negative for difficulty urinating, dysuria, flank pain and hematuria.  Musculoskeletal: Negative for back pain, joint swelling and neck pain.  Neurological: Negative for dizziness, speech difficulty, light-headedness and numbness. See hpi See HPI. All other review of systems negative.    Objective:   Vitals:   11/04/19 1047  BP: (!) 143/78  Pulse: (!) 56  Resp: 16  Temp: 98 F (36.7 C)  TempSrc: Temporal  SpO2: 100%  Weight: 156 lb (70.8 kg)  Height: '5\' 6"'  (1.676 m)    Body mass index is 25.18 kg/m.  BP (!) 143/78   Pulse (!) 56   Temp 98 F (36.7 C) (Temporal)   Resp 16   Ht '5\' 6"'  (1.676 m) Comment: per pt  Wt 156 lb (70.8 kg)   SpO2 100%   BMI 25.18 kg/m   General Appearance:    Alert, cooperative, no distress, appears stated age  Head:    Normocephalic, without obvious abnormality, atraumatic  Eyes:    PERRL, conjunctiva/corneas clear, EOM's intact  Ears:    Normal TM's and external ear canals, both ears  Neck:   Supple, symmetrical, trachea midline, no adenopathy;    thyroid:  no enlargement/tenderness/nodules; no carotid   bruit or JVD  Back:     Symmetric, no curvature, ROM normal, no CVA tenderness  Lungs:     Clear to auscultation bilaterally, respirations unlabored  Chest Wall:    No tenderness or deformity   Heart:    Regular rate and rhythm, S1 and S2 normal, no murmur, rub   or gallop  Breast Exam:    Mammogram one month ago, deferred   Abdomen:     Soft, non-tender, bowel sounds active all four quadrants,    no masses, no organomegaly  Extremities:   Extremities normal, atraumatic, no cyanosis or edema  Pulses:    2+ and symmetric all extremities  Skin:   Skin color, texture, turgor normal, no rashes or lesions  Lymph nodes:   Cervical, supraclavicular, and axillary nodes normal  Neurologic:   CNII-XII intact, normal strength, sensation and reflexes    throughout      Assessment/Plan:   Patient Self-Management and Personalized Health Advice The patient has been provided with information about:  use calcium 1 gram daily with Vit D, continue current medications and continue current healthy lifestyle patterns  During the course of the visit the patient was educated and counseled about appropriate screening and preventive services including:   lab testing as noted in orders section, return annually or prn     Body mass index is 25.18 kg/m. Discussed the patient's BMI with her. The BMI BMI is in the acceptable range  [name] was seen today for annual exam.  Diagnoses and all orders for this visit:  Encounter for Medicare annual wellness exam- Women's Health Maintenance Plan Advised monthly breast exam and annual mammogram Advised dental exam every six months Discussed stress management Discussed pap smear screening guidelines    Screening for thyroid disorder -     TSH  Screening for cholesterol level -     Lipid panel  Need for prophylactic vaccination against Streptococcus pneumoniae (pneumococcus)  Neuropathy -     Ambulatory referral to Neurology  Nonintractable episodic headache, unspecified headache type- will refer for evaluation  -     Ambulatory referral to Neurology  Recurrent falls- will refer to Neurology for evaluation No  deficiencies noted No weakness or wasting      No follow-ups on file.  Future Appointments  Date Time Provider [place]  11/23/2019 11:00 AM GI-BCG DX DEXA 1 GI-BC[facility]G GI-BREAST CE  01/20/2020  4:00 PM Marzetta Board, DPM [facility]  03/01/2020 10:00 AM Forrest Moron, MD PCP-PCP PEC  05/27/2020  1:30 PM PCV-ECHO/VAS  1 PCV-IMG None  06/03/2020  3:00 PM Adrian Prows, MD PCV-PCV None    Patient Instructions       If you have lab work done today you will be contacted with your lab results within the next 2 weeks.  If you have not heard from [place] then please contact us. The fastest way to get your results is to register for My Chart.   IF you received an x-ray today, you will receive an invoice from [facility]. Please contact [facility] at [phone] with questions or concerns regarding your invoice.   IF you received labwork today, you will receive an invoice from [facility]. Please contact [facility] at [phone] with questions or concerns regarding your invoice.   Our billing staff will not be able to assist you with questions regarding bills from these companies.  You will be contacted with the lab results as soon as they are available. The fastest way to get your results is to activate your My Chart account. Instructions are located on the last page of this paperwork. If you have not heard from [place] regarding the results in 2 weeks, please contact this office.       An after visit summary with all of these plans was given to the patient.

## 2019-11-04 NOTE — Patient Instructions (Signed)
° ° ° °  If you have lab work done today you will be contacted with your lab results within the next 2 weeks.  If you have not heard from us then please contact us. The fastest way to get your results is to register for My Chart. ° ° °IF you received an x-ray today, you will receive an invoice from [facility]. Please contact [facility] at [phone] with questions or concerns regarding your invoice.  ° °IF you received labwork today, you will receive an invoice from [facility]. Please contact [facility] at [phone] with questions or concerns regarding your invoice.  ° °Our billing staff will not be able to assist you with questions regarding bills from these companies. ° °You will be contacted with the lab results as soon as they are available. The fastest way to get your results is to activate your My Chart account. Instructions are located on the last page of this paperwork. If you have not heard from us regarding the results in 2 weeks, please contact this office. °  ° ° ° °

## 2019-11-05 LAB — LIPID PANEL
Chol/HDL Ratio: 2.9 ratio (ref 0.0–4.4)
Cholesterol, Total: 160 mg/dL (ref 100–199)
HDL: 55 mg/dL (ref >39)
LDL Chol Calc (NIH): 86 mg/dL (ref 0–99)
Triglycerides: 106 mg/dL (ref 0–149)
VLDL Cholesterol Cal: 19 mg/dL (ref 5–40)

## 2019-11-05 LAB — TSH: TSH: 1.17 u[IU]/mL (ref 0.450–4.500)

## 2019-11-11 ENCOUNTER — Encounter (INDEPENDENT_AMBULATORY_CARE_PROVIDER_SITE_OTHER): Payer: Self-pay

## 2019-11-11 NOTE — Progress Notes (Unsigned)
Faxed in refill request to [facility] [address] On 10/31/2019. Omeprazole 40 mg Qty. 30 w/1 refill. Take 1 caps. QD before dinner.

## 2019-11-23 ENCOUNTER — Other Ambulatory Visit: Payer: Medicare Other

## 2019-11-25 ENCOUNTER — Encounter (INDEPENDENT_AMBULATORY_CARE_PROVIDER_SITE_OTHER): Payer: Self-pay

## 2019-11-25 NOTE — Progress Notes (Unsigned)
Faxed Rx refill request from [facility] [address] Generic or Equivalent, Omeprazole 40 mg QD before dinner. Qty. 90 w/2 refills.

## 2019-11-26 ENCOUNTER — Ambulatory Visit
Admission: RE | Admit: 2019-11-26 | Discharge: 2019-11-26 | Disposition: A | Discharge status: Home / Self Care | Payer: Medicare Other | Source: Ambulatory Visit | Attending: Family Medicine | Admitting: Family Medicine

## 2019-11-26 ENCOUNTER — Other Ambulatory Visit: Payer: Self-pay

## 2019-11-26 DIAGNOSIS — E2839 Other primary ovarian failure: Secondary | ICD-10-CM

## 2019-11-26 DIAGNOSIS — Z780 Asymptomatic menopausal state: Secondary | ICD-10-CM | POA: Diagnosis not present

## 2019-11-26 DIAGNOSIS — M810 Age-related osteoporosis without current pathological fracture: Secondary | ICD-10-CM | POA: Diagnosis not present

## 2019-11-26 DIAGNOSIS — M85851 Other specified disorders of bone density and structure, right thigh: Secondary | ICD-10-CM | POA: Diagnosis not present

## 2019-11-29 ENCOUNTER — Other Ambulatory Visit: Payer: Self-pay | Admitting: Family Medicine

## 2019-11-29 MED ORDER — ALENDRONATE SODIUM 70 MG PO TABS: 70.0000 mg | ORAL_TABLET | ORAL | 3 refills | Status: DC

## 2019-11-30 ENCOUNTER — Telehealth: Payer: Self-pay | Admitting: Family Medicine

## 2019-11-30 NOTE — Progress Notes (Signed)
Spoke with pt regarding doctor result note, she will take the med and drinks lots of water, She is currently taking vit d, she will confirm that it is 2000 mg. She will also talk to her Dentist about monitoring her jaw.

## 2019-11-30 NOTE — Telephone Encounter (Signed)
Patient was informed.

## 2019-11-30 NOTE — Telephone Encounter (Signed)
Pt returning call,someone calling back for lab results

## 2019-12-01 ENCOUNTER — Other Ambulatory Visit: Payer: Self-pay

## 2019-12-01 ENCOUNTER — Encounter: Payer: Self-pay | Admitting: Neurology

## 2019-12-01 ENCOUNTER — Ambulatory Visit: Payer: Medicare Other | Admitting: Neurology

## 2019-12-01 ENCOUNTER — Telehealth: Payer: Self-pay | Admitting: Neurology

## 2019-12-01 VITALS — BP 137/90 | HR 87 | Temp 97.6°F | Ht 66.0 in | Wt 154.0 lb

## 2019-12-01 DIAGNOSIS — G44329 Chronic post-traumatic headache, not intractable: Secondary | ICD-10-CM

## 2019-12-01 DIAGNOSIS — G3184 Mild cognitive impairment, so stated: Secondary | ICD-10-CM

## 2019-12-01 DIAGNOSIS — I6990 Unspecified sequelae of unspecified cerebrovascular disease: Secondary | ICD-10-CM

## 2019-12-01 NOTE — Progress Notes (Signed)
[facility] [address] [place]. [place] [address] [phone]       OFFICE CONSULT NOTE  [name] Date of Birth:  [birth year] Medical Record Number:  [id number]   Referring MD: Collie Siad Reason for Referral: Headache  HPI: [name] is a 79 year old lady seen today for initial office consultation visit for headache.  History is obtained from the patient, review of referral notes and electronic medical records and have personally reviewed imaging films available in PACS.  She she states she had fallen October when she hit her forehead and developed a small right frontal scalp hematoma.  She was seen in the ER and was found to have a small hematoma in CT scan of the head personally reviewed showed mild age-appropriate changes of generalized cerebral atrophy and small vessel changes.  There are incidental thyroid nodules noted.  CT cervical spine showed multilevel degenerative disc and spondylitic changes with multilevel foraminal stenosis as well.  Patient states she has had intermittent headaches and neck pain since then.  She notices at times that she has scalp protrusion at the site of the headache it lasts for a few minutes and goes away.  When headache is severe at times she may feel off balance.  She also feels tightness in the right side of neck and posterior shoulder muscles.  She does take Tylenol or Motrin with some good effect but it takes a day or two for her symptoms to resolve.  This is happened about 4-5 times so far only.  She is unable to admit for specific triggers.  She does not feel there is any relationship between stress and this symptoms.  She denies any accompanying loss of vision, blurred vision, vertigo, gait and balance problems or slurred speech.  She had a history of TIA in November 2017 when she had left-sided weakness and sensory loss and actually saw me.  She has been on Plavix since then and has had no recurrent TIA or stroke  symptoms.  She has had trouble tolerating Crestor due to muscle aches but has been taking co-Q10 which helps somewhat.  She is tolerating Plavix without bruising or bleeding.  She is also complained of mild short-term memory difficulties in the last year or so.  She started taking Provigil in the last few months and feels it may be helping.  She is still independent and able to do all activities of daily living for herself.  She does not participate in regular activities which are cognitively challenging like solving crossword puzzles, playing bridge of sudoku.  There is no family stable tremors.  ROS:   14 system review of systems is positive for headache, neck pain, memory difficulties, gait imbalance all other systems negative  PMH:  Past Medical History:  Diagnosis Date  . Acute sinus infection 01/15/2011  . ANXIETY 10/03/2009  . BARRETTS ESOPHAGUS 10/03/2009  . CAROTID ARTERY DISEASE 08/31/2009  . COLONIC POLYPS, HX OF 08/29/2009  . CONSTIPATION, CHRONIC 10/03/2009  . DEPRESSION 10/03/2009  . DISC DISEASE, CERVICAL 08/29/2009  . DISC DISEASE, LUMBAR 08/29/2009  . Eustachian tube dysfunction 01/15/2011  . GERD 08/29/2009  . HYPERLIPIDEMIA 08/29/2009  . Impaired glucose tolerance 01/14/2011  . LEG PAIN, BILATERAL 08/29/2009  . LOW BACK PAIN, CHRONIC 11/22/2009  . PERIPHERAL NEUROPATHY 10/03/2009  . PERIPHERAL VASCULAR DISEASE 08/29/2009  . POSITIVE PPD 10/03/2009  . Preventative health care 01/14/2011  . Stroke (HCC)   . Thyroid nodule 08/09/2011  . TIA 11/22/2009  .  TRANSIENT ISCHEMIC ATTACK, HX OF 08/29/2009  . URETHRAL STRICTURE 10/03/2009  . VOCAL CORD POLYP, HX OF 10/03/2009  . WRIST PAIN, BILATERAL 05/25/2010    Social History:  Social History   Socioeconomic History  . Marital status: Widowed    Spouse name: Not on file  . Number of children: 3  . Years of education: Not on file  . Highest education level: Not on file  Occupational History  . 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 Smoking status: Never Smoker  . Smokeless tobacco: Never Used  Substance and Sexual Activity  . Alcohol use: No  . Drug use: No  . Sexual activity: Not Currently  Other Topics Concern  . Not on file  Social History Narrative   Husband with dementia   Social Determinants of Health   Financial Resource Strain:   . Difficulty of Paying Living Expenses:   Food Insecurity:   . Worried About Programme researcher, broadcasting/film/video in the Last Year:   . Barista in the Last Year:   Transportation Needs:   . Freight forwarder (Medical):   Marland Kitchen Lack of Transportation (Non-Medical):   Physical Activity:   . Days of Exercise per Week:   . Minutes of Exercise per Session:   Stress:   . Feeling of Stress :   Social Connections:   . Frequency of Communication with Friends and Family:   . Frequency of Social Gatherings with Friends and Family:   . Attends Religious Services:   . Active Member of Clubs or Organizations:   . Attends Banker Meetings:   Marland Kitchen Marital Status:   Intimate Partner Violence:   . Fear of Current or Ex-Partner:   . Emotionally Abused:   Marland Kitchen Physically Abused:   . Sexually Abused:     Medications:   Current Outpatient Medications on File Prior to Visit  Medication Sig Dispense Refill  . acetaminophen (TYLENOL) 325 MG tablet Take 650 mg by mouth every 6 (six) hours as needed for mild pain or headache.    . clopidogrel (PLAVIX) 75 MG tablet Take 75 mg by mouth daily.    . Coenzyme Q10 10 MG capsule Take 200 mg by mouth daily.    . Multiple Vitamins-Minerals (MULTIVITAMIN WITH MINERALS) tablet Take 1 tablet by mouth daily.    . rosuvastatin (CRESTOR) 40 MG tablet Take 1 tablet (40 mg total) by mouth daily. 90 tablet 3  . Omega-3 Fatty Acids (FISH OIL) 1000 MG CAPS Take 1,000 mg by mouth daily.      No current facility-administered medications on file prior to visit.    Allergies:   Allergies  Allergen Reactions  .  Atorvastatin     REACTION: myalgia  . Penicillins Hives and Itching    Has patient had a PCN reaction causing immediate rash, facial/tongue/throat swelling, SOB or lightheadedness with hypotension: no Has patient had a PCN reaction causing severe rash involving mucus membranes or skin necrosis: no Has patient had a PCN reaction that required hospitalization : no Has patient had a PCN reaction occurring within the last 10 years: no If all of the above answers are "NO", then may proceed with Cephalosporin use.     Physical Exam General: Frail elderly lady seated, in no evident distress Head: head normocephalic and atraumatic.   Neck: supple with no carotid or supraclavicular bruits Cardiovascular: regular rate and rhythm, no murmurs Musculoskeletal: no deformity Skin:  no rash/petichiae Vascular:  Normal pulses all extremities  Neurologic Exam Mental Status: Awake and fully alert. Oriented to place and time. Recent and remote memory intact. Attention span, concentration and fund of knowledge appropriate. Mood and affect appropriate.  Diminished recall 2/3.  Able to name 10 animals which walk on four legs. Cranial Nerves: Fundoscopic exam reveals sharp disc margins. Pupils equal, briskly reactive to light. Extraocular movements full without nystagmus. Visual fields full to confrontation. Hearing mildly diminished bilaterally. Facial sensation intact. Face, tongue, palate moves normally and symmetrically.  Motor: Normal bulk and tone. Normal strength in all tested extremity muscles. Sensory.: intact to touch , pinprick , position and vibratory sensation.  Coordination: Rapid alternating movements normal in all extremities. Finger-to-nose and heel-to-shin performed accurately bilaterally. Gait and Station: Arises from chair without difficulty. Stance is normal. Gait demonstrates normal stride length and balance . Able to heel, toe and tandem walk with only mild difficulty.  Reflexes: 1+ and  symmetric. Toes downgoing.   NIHSS  0 Modified Rankin  1   ASSESSMENT: 79 year old lady with posttraumatic headaches and neck pain likely tension headaches.  Remote history of TIAs and stable from neurovascular standpoint.  She also has a history of mild cognitive impairment which appears to be age-appropriate     PLAN: I had a long discussion with the patient regarding her posttraumatic headaches and neck pain which likely seem to be tension headaches.  I recommend she do regular neck stretching exercises and use Tylenol or Motrin as needed for symptomatic relief.  She is also complaining of mild cognitive impairment.  I recommend she do mentally challenging activities like solving crossword puzzles, playing bridge and sodoku.  Continue prevention.  We also discussed memory compensation strategies.  Check MRI scan of the brain with and without contrast.  Continue Plavix for stroke prevention and maintain aggressive risk factor modification with strict control of hypertension with blood pressure goal below 130/90, lipids with LDL cholesterol goal below 70 mg percent and diabetes with hemoglobin A1c goal below 6.5%.  Greater than 50% time during this 50-minute consultation visit was spent on counseling and coordination of care about her headaches and remote TIAs and answering questions she will return for follow-up in the future in 3 months with my nurse practitioner [name] or call earlier if necessary. Delia Heady, MD  [facility] [address] [place], [place] [address]  Phone [phone] Fax [phone] Note: This document was prepared with digital dictation and possible smart phrase technology. Any transcriptional errors that result from this process are unintentional.

## 2019-12-01 NOTE — Patient Instructions (Signed)
I had a long discussion with the patient regarding her posttraumatic headaches and neck pain which likely seem to be tension headaches.  I recommend she do regular neck stretching exercises and use Tylenol or Motrin as needed for symptomatic relief.  She is also complaining of mild cognitive impairment.  I recommend she do mentally challenging activities like solving crossword puzzles, playing bridge and sodoku.  Continue prevention.  We also discussed memory compensation strategies.  Check MRI scan of the brain with and without contrast.  Continue Plavix for stroke prevention and maintain aggressive risk factor modification with strict control of hypertension with blood pressure goal below 130/90, lipids with LDL cholesterol goal below 70 mg percent and diabetes with hemoglobin A1c goal below 6.5%.  She will return for follow-up in the future in 3 months with my nurse practitioner Janett Billow or call earlier if necessary.  Neck Exercises Ask your health care provider which exercises are safe for you. Do exercises exactly as told by your health care provider and adjust them as directed. It is normal to feel mild stretching, pulling, tightness, or discomfort as you do these exercises. Stop right away if you feel sudden pain or your pain gets worse. Do not begin these exercises until told by your health care provider. Neck exercises can be important for many reasons. They can improve strength and maintain flexibility in your neck, which will help your upper back and prevent neck pain. Stretching exercises Rotation neck stretching  1. Sit in a chair or stand up. 2. Place your feet flat on the floor, shoulder width apart. 3. Slowly turn your head (rotate) to the right until a slight stretch is felt. Turn it all the way to the right so you can look over your right shoulder. Do not tilt or tip your head. 4. Hold this position for 10-30 seconds. 5. Slowly turn your head (rotate) to the left until a slight stretch  is felt. Turn it all the way to the left so you can look over your left shoulder. Do not tilt or tip your head. 6. Hold this position for 10-30 seconds. Repeat __________ times. Complete this exercise __________ times a day. Neck retraction 1. Sit in a sturdy chair or stand up. 2. Look straight ahead. Do not bend your neck. 3. Use your fingers to push your chin backward (retraction). Do not bend your neck for this movement. Continue to face straight ahead. If you are doing the exercise properly, you will feel a slight sensation in your throat and a stretch at the back of your neck. 4. Hold the stretch for 1-2 seconds. Repeat __________ times. Complete this exercise __________ times a day. Strengthening exercises Neck press 1. Lie on your back on a firm bed or on the floor with a pillow under your head. 2. Use your neck muscles to push your head down on the pillow and straighten your spine. 3. Hold the position as well as you can. Keep your head facing up (in a neutral position) and your chin tucked. 4. Slowly count to 5 while holding this position. Repeat __________ times. Complete this exercise __________ times a day. Isometrics These are exercises in which you strengthen the muscles in your neck while keeping your neck still (isometrics). 1. Sit in a supportive chair and place your hand on your forehead. 2. Keep your head and face facing straight ahead. Do not flex or extend your neck while doing isometrics. 3. Push forward with your head and neck while pushing  back with your hand. Hold for 10 seconds. 4. Do the sequence again, this time putting your hand against the back of your head. Use your head and neck to push backward against the hand pressure. 5. Finally, do the same exercise on either side of your head, pushing sideways against the pressure of your hand. Repeat __________ times. Complete this exercise __________ times a day. Prone head lifts 1. Lie face-down (prone position),  resting on your elbows so that your chest and upper back are raised. 2. Start with your head facing downward, near your chest. Position your chin either on or near your chest. 3. Slowly lift your head upward. Lift until you are looking straight ahead. Then continue lifting your head as far back as you can comfortably stretch. 4. Hold your head up for 5 seconds. Then slowly lower it to your starting position. Repeat __________ times. Complete this exercise __________ times a day. Supine head lifts 1. Lie on your back (supine position), bending your knees to point to the ceiling and keeping your feet flat on the floor. 2. Lift your head slowly off the floor, raising your chin toward your chest. 3. Hold for 5 seconds. Repeat __________ times. Complete this exercise __________ times a day. Scapular retraction 1. Stand with your arms at your sides. Look straight ahead. 2. Slowly pull both shoulders (scapulae) backward and downward (retraction) until you feel a stretch between your shoulder blades in your upper back. 3. Hold for 10-30 seconds. 4. Relax and repeat. Repeat __________ times. Complete this exercise __________ times a day. Contact a health care provider if:  Your neck pain or discomfort gets much worse when you do an exercise.  Your neck pain or discomfort does not improve within 2 hours after you exercise. If you have any of these problems, stop exercising right away. Do not do the exercises again unless your health care provider says that you can. Get help right away if:  You develop sudden, severe neck pain. If this happens, stop exercising right away. Do not do the exercises again unless your health care provider says that you can. This information is not intended to replace advice given to you by your health care provider. Make sure you discuss any questions you have with your health care provider. Document Revised: 05/14/2018 Document Reviewed: 05/14/2018 Elsevier Patient  Education  2020 [employer].  Memory Compensation Strategies  1. Use "WARM" strategy.  W= write it down  A= associate it  R= repeat it  M= make a mental note  2.   You can keep a Glass blower/designer.  Use a 3-ring notebook with sections for the following: calendar, important names and phone numbers,  medications, doctors' names/phone numbers, lists/reminders, and a section to journal what you did  each day.   3.    Use a calendar to write appointments down.  4.    Write yourself a schedule for the day.  This can be placed on the calendar or in a separate section of the Memory Notebook.  Keeping a  regular schedule can help memory.  5.    Use medication organizer with sections for each day or morning/evening pills.  You may need help loading it  6.    Keep a basket, or pegboard by the door.  Place items that you need to take out with you in the basket or on the pegboard.  You may also want to  include a message board for reminders.  7.  Use sticky notes.  Place sticky notes with reminders in a place where the task is performed.  For example: " turn off the  stove" placed by the stove, "lock the door" placed on the door at eye level, " take your medications" on  the bathroom mirror or by the place where you normally take your medications.  8.    Use alarms/timers.  Use while cooking to remind yourself to check on food or as a reminder to take your medicine, or as a  reminder to make a call, or as a reminder to perform another task, etc.

## 2019-12-01 NOTE — Telephone Encounter (Signed)
[employer] medicare order sent to [facility]. No auth they will reach out to the patient to schedule.  

## 2019-12-30 ENCOUNTER — Other Ambulatory Visit: Payer: Self-pay

## 2019-12-30 MED ORDER — ROSUVASTATIN CALCIUM 40 MG PO TABS: 40.0000 mg | ORAL_TABLET | Freq: Every day | ORAL | 3 refills | Status: DC

## 2019-12-30 MED ORDER — CLOPIDOGREL BISULFATE 75 MG PO TABS: 75.0000 mg | ORAL_TABLET | Freq: Every day | ORAL | 0 refills | Status: DC

## 2019-12-30 NOTE — Telephone Encounter (Signed)
Left detailed voicemail regarding clopidogrel 75 mg refill request from [facility] [place] . Didn't see where it was prescribed by PCP before , asked to call to offer virtual appt. or follow pcp phone #.

## 2020-01-05 DIAGNOSIS — R413 Other amnesia: Secondary | ICD-10-CM | POA: Diagnosis not present

## 2020-01-05 DIAGNOSIS — Z9189 Other specified personal risk factors, not elsewhere classified: Secondary | ICD-10-CM | POA: Diagnosis not present

## 2020-01-11 ENCOUNTER — Ambulatory Visit
Admission: RE | Admit: 2020-01-11 | Discharge: 2020-01-11 | Disposition: A | Discharge status: Home / Self Care | Payer: Medicare Other | Source: Ambulatory Visit | Attending: Neurology | Admitting: Neurology

## 2020-01-11 DIAGNOSIS — G44329 Chronic post-traumatic headache, not intractable: Secondary | ICD-10-CM

## 2020-01-11 MED ORDER — GADOBENATE DIMEGLUMINE 529 MG/ML IV SOLN
15.0000 mL | Freq: Once | INTRAVENOUS | Status: AC | PRN
  Administered 2020-01-11: 15 mL via INTRAVENOUS

## 2020-01-13 ENCOUNTER — Telehealth: Payer: Self-pay | Admitting: *Deleted

## 2020-01-13 NOTE — Telephone Encounter (Signed)
Please call pt has question about Mri results. [phone]

## 2020-01-14 NOTE — Telephone Encounter (Signed)
Kindly inform the patient that MRI scan of the brain shows age-appropriate changes of mild hardening of the arteries.  No worrisome finding.

## 2020-01-14 NOTE — Telephone Encounter (Signed)
Pt sent mychart message about MRI results.

## 2020-01-15 ENCOUNTER — Encounter: Payer: Self-pay | Admitting: Surgery

## 2020-01-18 NOTE — Telephone Encounter (Signed)
I called [name] that her MRI brain sows age appropriate changes of mild hardening of arteries and no worrisome findings.[name] stated she tightness of right side of arm and leg. I stated this was a issue she discuss with Dr Pearlean Brownie at last visit and to contact her primary doctor for further instructions. She verbalized understanding.

## 2020-01-18 NOTE — Telephone Encounter (Signed)
Pt has called for the results of her sleep study because she is not computer savvy.  Pt states since her MRI she has had right leg pain going to her foot.  Pt is asking for a call from RN to discuss results.

## 2020-01-20 ENCOUNTER — Ambulatory Visit: Payer: Medicare Other | Admitting: Podiatry

## 2020-01-20 ENCOUNTER — Other Ambulatory Visit: Payer: Self-pay

## 2020-01-20 ENCOUNTER — Encounter: Payer: Self-pay | Admitting: Podiatry

## 2020-01-20 DIAGNOSIS — M79671 Pain in right foot: Secondary | ICD-10-CM

## 2020-01-20 DIAGNOSIS — M79672 Pain in left foot: Secondary | ICD-10-CM

## 2020-01-20 DIAGNOSIS — L84 Corns and callosities: Secondary | ICD-10-CM

## 2020-01-20 NOTE — Patient Instructions (Signed)

## 2020-01-25 NOTE — Progress Notes (Signed)
Subjective: [name] is a pleasant 79 y.o. female patient seen today painful callus(es) b/l feet. Aggravating factors include weightbearing with and without shoe gear. Pain is relieved with periodic professional debridement.  Past Medical History:  Diagnosis Date  . Acute sinus infection 01/15/2011  . ANXIETY 10/03/2009  . BARRETTS ESOPHAGUS 10/03/2009  . CAROTID ARTERY DISEASE 08/31/2009  . COLONIC POLYPS, HX OF 08/29/2009  . CONSTIPATION, CHRONIC 10/03/2009  . DEPRESSION 10/03/2009  . DISC DISEASE, CERVICAL 08/29/2009  . DISC DISEASE, LUMBAR 08/29/2009  . Eustachian tube dysfunction 01/15/2011  . GERD 08/29/2009  . HYPERLIPIDEMIA 08/29/2009  . Impaired glucose tolerance 01/14/2011  . LEG PAIN, BILATERAL 08/29/2009  . LOW BACK PAIN, CHRONIC 11/22/2009  . PERIPHERAL NEUROPATHY 10/03/2009  . PERIPHERAL VASCULAR DISEASE 08/29/2009  . POSITIVE PPD 10/03/2009  . Preventative health care 01/14/2011  . Stroke (HCC)   . Thyroid nodule 08/09/2011  . TIA 11/22/2009  . TRANSIENT ISCHEMIC ATTACK, HX OF 08/29/2009  . URETHRAL STRICTURE 10/03/2009  . VOCAL CORD POLYP, HX OF 10/03/2009  . WRIST PAIN, BILATERAL 05/25/2010    Patient Active Problem List   Diagnosis Date Noted  . Dizziness 08/14/2017  . Chronic swimmer's ear of both sides 12/13/2016  . Acute left hemiparesis (HCC)   . TIA (transient ischemic attack) 06/01/2016  . CVA (cerebral vascular accident) (HCC) 06/01/2016  . Transient cerebral ischemia   . Pelvic prolapse 11/03/2014  . Cystocele 09/29/2014  . SUI (stress urinary incontinence, female) 09/29/2014  . Complicated migraine 08/22/2014  . Multinodular goiter 08/09/2011  . Lumbar pain with radiation down right leg 04/11/2011  . Rash 04/11/2011  . Impaired glucose tolerance 01/14/2011  . Preventative health care 01/14/2011  . WRIST PAIN, BILATERAL 05/25/2010  . TIA 11/22/2009  . LOW BACK PAIN, CHRONIC 11/22/2009  . ANXIETY 10/03/2009  . DEPRESSION 10/03/2009  . PERIPHERAL NEUROPATHY  10/03/2009  . BARRETTS ESOPHAGUS 10/03/2009  . CONSTIPATION, CHRONIC 10/03/2009  . URETHRAL STRICTURE 10/03/2009  . POSITIVE PPD 10/03/2009  . VOCAL CORD POLYP, HX OF 10/03/2009  . Carotid artery disease (HCC) 08/31/2009  . Hyperlipemia 08/29/2009  . PERIPHERAL VASCULAR DISEASE 08/29/2009  . GERD 08/29/2009  . DISC DISEASE, CERVICAL 08/29/2009  . DISC DISEASE, LUMBAR 08/29/2009  . LEG PAIN, BILATERAL 08/29/2009  . TRANSIENT ISCHEMIC ATTACK, HX OF 08/29/2009  . COLONIC POLYPS, HX OF 08/29/2009    Current Outpatient Medications on File Prior to Visit  Medication Sig Dispense Refill  . acetaminophen (TYLENOL) 325 MG tablet Take 650 mg by mouth every 6 (six) hours as needed for mild pain or headache.    . clopidogrel (PLAVIX) 75 MG tablet Take 1 tablet (75 mg total) by mouth daily. 90 tablet 0  . Coenzyme Q10 10 MG capsule Take 200 mg by mouth daily.    . famotidine (PEPCID) 20 MG tablet Take 40 mg by mouth daily.    . Multiple Vitamins-Minerals (MULTIVITAMIN WITH MINERALS) tablet Take 1 tablet by mouth daily.    . Omega-3 Fatty Acids (FISH OIL) 1000 MG CAPS Take 1,000 mg by mouth daily.     . rosuvastatin (CRESTOR) 40 MG tablet Take 1 tablet (40 mg total) by mouth daily. 90 tablet 3   No current facility-administered medications on file prior to visit.    Allergies  Allergen Reactions  . Atorvastatin     REACTION: myalgia  . Penicillins Hives and Itching    Has patient had a PCN reaction causing immediate rash, facial/tongue/throat swelling, SOB or lightheadedness with hypotension:  no Has patient had a PCN reaction causing severe rash involving mucus membranes or skin necrosis: no Has patient had a PCN reaction that required hospitalization : no Has patient had a PCN reaction occurring within the last 10 years: no If all of the above answers are "NO", then may proceed with Cephalosporin use.     Objective: Physical Exam  General: [name] is a pleasant 79 y.o.  African American female, WD, WN in NAD. AAO x 3.   Vascular:  Neurovascular status unchanged b/l lower extremities. Capillary refill time to digits immediate b/l. Palpable pedal pulses b/l LE. Pedal hair present. Lower extremity skin temperature gradient within normal limits. No pain with calf compression b/l. No edema noted b/l lower extremities.  Dermatological:  Pedal skin with normal turgor, texture and tone bilaterally. No open wounds bilaterally. No interdigital macerations bilaterally. Toenails 1-5 b/l well maintained with adequate length. No erythema, no edema, no drainage, no flocculence. Hyperkeratotic lesion(s) submet head 2 left foot, submet head 3 left foot, submet head 4 left foot and submet head 5 right foot.  No erythema, no edema, no drainage, no flocculence.  Musculoskeletal:  Normal muscle strength 5/5 to all lower extremity muscle groups bilaterally. No pain crepitus or joint limitation noted with ROM b/l. No gross bony deformities bilaterally. Patient ambulates independent of any assistive aids.  Neurological:  Protective sensation intact 5/5 intact bilaterally with 10g monofilament b/l. Vibratory sensation intact b/l. Proprioception intact bilaterally.  Assessment and Plan:  1. Callus   2. Pain in both feet    -Examined patient. -No new findings. No new orders. -Callus(es) submet head 2 left foot, submet head 3 left foot, submet head 4 left foot and submet head 5 right foot pared utilizing sterile scalpel blade without complication or incident. Total number debrided =4. -Patient to report any pedal injuries to medical professional immediately. -Patient to continue soft, supportive shoe gear daily. -Patient/POA to call should there be question/concern in the interim.  Return in about 3 months (around 04/21/2020) for callus trim.  Marzetta Board, DPM

## 2020-02-01 ENCOUNTER — Encounter (HOSPITAL_COMMUNITY): Payer: Self-pay

## 2020-02-01 ENCOUNTER — Emergency Department (HOSPITAL_COMMUNITY)
Admission: EM | Admit: 2020-02-01 | Discharge: 2020-02-01 | Disposition: A | Discharge status: Home / Self Care | Payer: Medicare Other | Attending: Emergency Medicine | Admitting: Emergency Medicine

## 2020-02-01 ENCOUNTER — Other Ambulatory Visit: Payer: Self-pay

## 2020-02-01 ENCOUNTER — Emergency Department (HOSPITAL_COMMUNITY): Payer: Medicare Other

## 2020-02-01 DIAGNOSIS — M25551 Pain in right hip: Secondary | ICD-10-CM

## 2020-02-01 DIAGNOSIS — M47816 Spondylosis without myelopathy or radiculopathy, lumbar region: Secondary | ICD-10-CM | POA: Diagnosis not present

## 2020-02-01 DIAGNOSIS — M778 Other enthesopathies, not elsewhere classified: Secondary | ICD-10-CM | POA: Diagnosis not present

## 2020-02-01 DIAGNOSIS — M1611 Unilateral primary osteoarthritis, right hip: Secondary | ICD-10-CM | POA: Diagnosis not present

## 2020-02-01 MED ORDER — NAPROXEN 375 MG PO TABS: 375.0000 mg | ORAL_TABLET | Freq: Two times a day (BID) | ORAL | 0 refills | Status: DC

## 2020-02-01 MED ORDER — IBUPROFEN 800 MG PO TABS
800.0000 mg | ORAL_TABLET | Freq: Once | ORAL | Status: AC
  Administered 2020-02-01: 800 mg via ORAL
  Filled 2020-02-01: qty 1

## 2020-02-01 NOTE — ED Notes (Signed)
Patient state she has dementia "sometimes" but currently she is A&O x4. Patient requests her daughter at bedside

## 2020-02-01 NOTE — ED Triage Notes (Addendum)
Pt sts right hip pain off and on since yesterday. Concerned her hip is popping out of place.

## 2020-02-01 NOTE — Discharge Instructions (Addendum)
1.  Start taking naproxen as prescribed twice daily.  Take this for the next 3 to 5 days.  If pain is improved, you may continue to take as needed. 2.  Start some very gentle forward and back stretching of the hip.  Do this a few times every 3-4 hours.  If this is very painful do not do it.  At this time, try to avoid crossing your legs and using your right leg for standing from a chair and climbing stairs without the assistance of handrails. 3.  Make an appointment to see the orthopedic doctor as soon as possible for recheck. 4.  Return to the emergency department if you have worsening or changing symptoms.

## 2020-02-01 NOTE — ED Provider Notes (Signed)
[place] DEPT Provider Note   CSN: [id number] Arrival date & time: 02/01/20  1900     History Chief Complaint  Patient presents with  . Hip Pain    [name] is a 79 y.o. female.  HPI Patient denies any acute injury to her right hip.  She began experiencing pain approximately 3 days earlier.  She did have a car ride to [place] but reports that the pain preceded this.  She reports pain goes around her hip laterally she indicates around the trochanter and then wraps to the medial deep aspect of the upper inner leg.  She gets a feeling that it might spontaneously dislocate.  Pain will resolve with rest.  She reports she could be pain-free for a while but then going from a sitting to standing she feels that she needs to support and hold the hip on the outside and will get an intense pain that wraps around.  No numbness or tingling to the lower leg or foot.  He does not perceive that she is having any back pain.  She got some relief with 800 mg of ibuprofen.  Pain however seems to be precipitated by certain positions and feeling that it is clicking or locking sometimes when trying to walk.  Patient denies it pain becomes intense or cramping with exercise and then requires rest.  No rest pain.  No symptoms suggestive of claudication.    Past Medical History:  Diagnosis Date  . Acute sinus infection 01/15/2011  . ANXIETY 10/03/2009  . BARRETTS ESOPHAGUS 10/03/2009  . CAROTID ARTERY DISEASE 08/31/2009  . COLONIC POLYPS, HX OF 08/29/2009  . CONSTIPATION, CHRONIC 10/03/2009  . DEPRESSION 10/03/2009  . [place] DISEASE, CERVICAL 08/29/2009  . [place] DISEASE, LUMBAR 08/29/2009  . Eustachian tube dysfunction 01/15/2011  . GERD 08/29/2009  . HYPERLIPIDEMIA 08/29/2009  . Impaired glucose tolerance 01/14/2011  . LEG PAIN, BILATERAL 08/29/2009  . LOW BACK PAIN, CHRONIC 11/22/2009  . PERIPHERAL NEUROPATHY 10/03/2009  . PERIPHERAL VASCULAR DISEASE 08/29/2009  . POSITIVE PPD 10/03/2009   . Preventative health care 01/14/2011  . Stroke ([place])   . Thyroid nodule 08/09/2011  . TIA 11/22/2009  . TRANSIENT ISCHEMIC ATTACK, HX OF 08/29/2009  . URETHRAL STRICTURE 10/03/2009  . VOCAL CORD POLYP, HX OF 10/03/2009  . WRIST PAIN, BILATERAL 05/25/2010    Patient Active Problem List   Diagnosis Date Noted  . Dizziness 08/14/2017  . Chronic swimmer's ear of both sides 12/13/2016  . Acute left hemiparesis ([place])   . TIA (transient ischemic attack) 06/01/2016  . CVA (cerebral vascular accident) ([place]) 06/01/2016  . Transient cerebral ischemia   . Pelvic prolapse 11/03/2014  . Cystocele 09/29/2014  . SUI (stress urinary incontinence, female) 09/29/2014  . Complicated migraine 45/80/9983  . Multinodular goiter 08/09/2011  . Lumbar pain with radiation down right leg 04/11/2011  . Rash 04/11/2011  . Impaired glucose tolerance 01/14/2011  . Preventative health care 01/14/2011  . WRIST PAIN, BILATERAL 05/25/2010  . TIA 11/22/2009  . LOW BACK PAIN, CHRONIC 11/22/2009  . ANXIETY 10/03/2009  . DEPRESSION 10/03/2009  . PERIPHERAL NEUROPATHY 10/03/2009  . BARRETTS ESOPHAGUS 10/03/2009  . CONSTIPATION, CHRONIC 10/03/2009  . URETHRAL STRICTURE 10/03/2009  . POSITIVE PPD 10/03/2009  . VOCAL CORD POLYP, HX OF 10/03/2009  . Carotid artery disease ([place]) 08/31/2009  . Hyperlipemia 08/29/2009  . PERIPHERAL VASCULAR DISEASE 08/29/2009  . GERD 08/29/2009  . [place] DISEASE, CERVICAL 08/29/2009  . [place] DISEASE, LUMBAR 08/29/2009  . LEG  PAIN, BILATERAL 08/29/2009  . TRANSIENT ISCHEMIC ATTACK, HX OF 08/29/2009  . COLONIC POLYPS, HX OF 08/29/2009    Past Surgical History:  Procedure Laterality Date  . hx of right ankle fracture    . s/p vocal cord polyps  1984   chronic hoarseness  . stress test negative  2003,2004,2006  . TUBAL LIGATION    . tubaligation    . uterine prolapse       OB History   No obstetric history on file.     Family History  Problem Relation Age of Onset  . Stroke  Mother   . Heart disease Mother   . Hypertension Mother   . Diabetes Mother   . Multiple myeloma Sister   . Cancer Other        lung    Social History   Tobacco Use  . Smoking status: Never Smoker  . Smokeless tobacco: Never Used  Vaping Use  . Vaping Use: Never used  Substance Use Topics  . Alcohol use: No  . Drug use: No    Home Medications Prior to Admission medications   Medication Sig Start Date End Date Taking? Authorizing Provider  acetaminophen (TYLENOL) 325 MG tablet Take 650 mg by mouth every 6 (six) hours as needed for mild pain or headache.    [provider]  clopidogrel (PLAVIX) 75 MG tablet Take 1 tablet (75 mg total) by mouth daily. 12/30/19 03/29/20  Rutherford Guys, MD  Coenzyme Q10 10 MG capsule Take 200 mg by mouth daily.    [provider]  famotidine (PEPCID) 20 MG tablet Take 40 mg by mouth daily. 01/01/20   [provider]  Multiple Vitamins-Minerals (MULTIVITAMIN WITH MINERALS) tablet Take 1 tablet by mouth daily.    [provider]  naproxen (NAPROSYN) 375 MG tablet Take 1 tablet (375 mg total) by mouth 2 (two) times daily. 02/01/20   Charlesetta Shanks, MD  Omega-3 Fatty Acids (FISH OIL) 1000 MG CAPS Take 1,000 mg by mouth daily.     [provider]  rosuvastatin (CRESTOR) 40 MG tablet Take 1 tablet (40 mg total) by mouth daily. 12/30/19   Rutherford Guys, MD    Allergies    Atorvastatin and Penicillins  Review of Systems   Review of Systems Constitutional: No fever no chills no malaise Cardiovascular: No chest pain no shortness of breath no calf swelling or pain Neurologic: No weakness numbness or tingling of the extremities. Physical Exam Updated Vital Signs BP (!) 149/76 (BP Location: Left Arm)   Pulse 75   Temp 98.2 F (36.8 C) (Oral)   Resp 16   Ht '5\' 6"'  (1.676 m)   Wt 70 kg   SpO2 100%   BMI 24.91 kg/m   Physical Exam Constitutional:      Comments: Alert and nontoxic.  Clinically well in  appearance.  HENT:     Head: Normocephalic and atraumatic.  Cardiovascular:     Rate and Rhythm: Normal rate and regular rhythm.  Pulmonary:     Effort: Pulmonary effort is normal.     Breath sounds: Normal breath sounds.  Abdominal:     General: There is no distension.     Palpations: Abdomen is soft.     Tenderness: There is no abdominal tenderness. There is no guarding.  Musculoskeletal:     Comments: No visible soft tissue abnormalities or swelling or deformity around the hip or pelvis.  Tenderness over the SI joint or lumbar spine.  No focal tenderness over the trochanter.  Soft tissue palpation in the groin and high medial thigh are normal.  No peripheral edema.  Patient has some peripheral varicose veins.  The calves are soft and nontender.  No effusions at the knee or the ankles.  Patient has very soft distal pulses.  Pencil Doppler used and confirmed to both DP and posterior tibial pulses.  Skin condition of feet is good no wounds or cellulitic changes.  Patient can go through full range of motion of the right hip.  She is flexible to go into deep flexion without pain and can push with good strength against resistance.  No significantly reproducible pain for inversion eversion rotation.  Neurological:     General: No focal deficit present.     Mental Status: She is oriented to person, place, and time.     Cranial Nerves: No cranial nerve deficit.     Coordination: Coordination normal.  Psychiatric:        Mood and Affect: Mood normal.     ED Results / Procedures / Treatments   Labs (all labs ordered are listed, but only abnormal results are displayed) Labs Reviewed - No data to display  EKG None  Radiology CT Hip Right Wo Contrast  Result Date: 02/01/2020 CLINICAL DATA:  Right hip pain. EXAM: CT OF THE RIGHT HIP WITHOUT CONTRAST TECHNIQUE: Multidetector CT imaging of the right hip was performed according to the standard protocol. Multiplanar CT image reconstructions were  also generated. COMPARISON:  None. FINDINGS: Bones/Joint/Cartilage There is no evidence of acute fracture or dislocation. Mild to moderate severity degenerative changes seen along the lateral aspect of the right acetabulum. Mild right hip joint space narrowing is also seen. No lytic or blastic lesions are identified. There is no evidence of cortical destruction or acute periosteal reaction. Ligaments Suboptimally assessed by CT. Muscles and Tendons Muscles and tendons are intact. Soft tissues No soft tissue hematomas are identified. Of incidental note is the presence of numerous diverticula throughout the visualized portion of the sigmoid colon. IMPRESSION: 1. No evidence of acute fracture or dislocation. 2. Mild to moderate severity degenerative changes involving the right hip. 3. Sigmoid diverticulosis. Electronically Signed   By: Virgina Norfolk M.D.   On: 02/01/2020 22:40   DG Hip Unilat  With Pelvis 2-3 Views Right  Result Date: 02/01/2020 CLINICAL DATA:  Hip pain, intermittent since yesterday without injury, difficulty with weight-bearing EXAM: DG HIP (WITH OR WITHOUT PELVIS) 2-3V RIGHT COMPARISON:  CT abdomen pelvis 04/06/2010 FINDINGS: The bones of the pelvis appear intact and congruent. The arcuate lines are contiguous. Proximal femora are intact and normally located. Moderate degenerative changes throughout the lower lumbar spine, SI joints, hips and symphysis pubis. Enthesopathic changes upon the iliac crests, ischial tuberosities and greater trochanters. Vascular calcium noted in the pelvis. Remaining soft tissues are unremarkable. IMPRESSION: 1. No acute bony abnormality. 2. Moderate degenerative changes throughout the lower lumbar spine, SI joints, and symphysis pubis. Electronically Signed   By: Lovena Le M.D.   On: 02/01/2020 19:41    Procedures Procedures (including critical care time)  Medications Ordered in ED Medications  ibuprofen (ADVIL) tablet 800 mg (800 mg Oral Given 02/01/20  2216)    ED Course  I have reviewed the triage vital signs and the nursing notes.  Pertinent labs & imaging results that were available during my care of the patient were reviewed by me and considered in my medical decision making (see chart for details).  MDM Rules/Calculators/A&P                         Patient with hip pain that seems to be precipitated by certain positions and activities.  Particularly patient has perception of instability of the hip.  Perceives a clicking and locking sensation.  Plain film x-ray shows arthritic changes.  I did also obtain CT to rule out any appearance of occult stress fracture.  No other additional acute anomalies identified.  At this time differential includes arthritic pain in acute flare of osteoarthritis, ligamentous and or labrum overuse syndromes.  Deep muscle strain of abductors.  At this time, patient does not wish to take any narcotic pain medications for pain control.  I will have her continue with naproxen 375 twice daily.  Patient had renal function earlier this year that was normal.  she is counseled to take this medication twice daily for the next 3 to 5 days and follow-up with orthopedics.  Patient reports she has been seen at [facility] orthopedics in the past for other conditions. Final Clinical Impression(s) / ED Diagnoses Final diagnoses:  Right hip pain    Rx / DC Orders ED Discharge Orders         Ordered    naproxen (NAPROSYN) 375 MG tablet  2 times daily     Discontinue  Reprint     02/01/20 2300           Charlesetta Shanks, MD 02/02/20 1611

## 2020-02-15 DIAGNOSIS — H2513 Age-related nuclear cataract, bilateral: Secondary | ICD-10-CM | POA: Diagnosis not present

## 2020-02-15 NOTE — Progress Notes (Signed)
Kindly inform the patient that MRI scan of the brain showed only minor age-related changes of hardening of the arteries.  No worrisome finding

## 2020-03-01 ENCOUNTER — Ambulatory Visit: Payer: Medicare Other | Admitting: Family Medicine

## 2020-03-02 ENCOUNTER — Emergency Department (HOSPITAL_COMMUNITY)
Admission: EM | Admit: 2020-03-02 | Discharge: 2020-03-02 | Disposition: A | Discharge status: Home / Self Care | Payer: Medicare Other | Attending: Emergency Medicine | Admitting: Emergency Medicine

## 2020-03-02 ENCOUNTER — Encounter (HOSPITAL_COMMUNITY): Payer: Self-pay | Admitting: Obstetrics and Gynecology

## 2020-03-02 ENCOUNTER — Other Ambulatory Visit: Payer: Self-pay

## 2020-03-02 DIAGNOSIS — Z20822 Contact with and (suspected) exposure to covid-19: Secondary | ICD-10-CM | POA: Insufficient documentation

## 2020-03-02 DIAGNOSIS — Z5321 Procedure and treatment not carried out due to patient leaving prior to being seen by health care provider: Secondary | ICD-10-CM | POA: Insufficient documentation

## 2020-03-02 DIAGNOSIS — R093 Abnormal sputum: Secondary | ICD-10-CM | POA: Insufficient documentation

## 2020-03-02 LAB — SARS CORONAVIRUS 2 BY RT PCR (HOSPITAL ORDER, PERFORMED IN ~~LOC~~ HOSPITAL LAB): SARS Coronavirus 2: NEGATIVE

## 2020-03-02 NOTE — ED Triage Notes (Signed)
Patient reports she has lost her voice and is congested. Patient denies N/V/D. Patient reports copious amounts of mucus

## 2020-03-03 ENCOUNTER — Ambulatory Visit (HOSPITAL_COMMUNITY)
Admission: EM | Admit: 2020-03-03 | Discharge: 2020-03-03 | Disposition: A | Discharge status: Home / Self Care | Payer: Medicare Other | Attending: Family Medicine | Admitting: Family Medicine

## 2020-03-03 ENCOUNTER — Ambulatory Visit (INDEPENDENT_AMBULATORY_CARE_PROVIDER_SITE_OTHER): Payer: Medicare Other

## 2020-03-03 ENCOUNTER — Encounter (HOSPITAL_COMMUNITY): Payer: Self-pay | Admitting: Emergency Medicine

## 2020-03-03 DIAGNOSIS — R05 Cough: Secondary | ICD-10-CM | POA: Diagnosis not present

## 2020-03-03 DIAGNOSIS — R0602 Shortness of breath: Secondary | ICD-10-CM

## 2020-03-03 DIAGNOSIS — J069 Acute upper respiratory infection, unspecified: Secondary | ICD-10-CM | POA: Diagnosis not present

## 2020-03-03 MED ORDER — PREDNISONE 20 MG PO TABS: ORAL_TABLET | ORAL | 0 refills | Status: DC

## 2020-03-03 NOTE — ED Triage Notes (Signed)
Pt presents with clear productive cough, chest tightness, SOB when lying down, headache, nasal congestion. States has diarrhea after taking laxative.  Denies sore throat, N, V, fever.  Taken Mucinex and tylenol with minimal relief.    Pt went to [facility] ED yesterday, was not seen. States was tested for COVID with negative result.

## 2020-03-03 NOTE — Discharge Instructions (Addendum)
Your chest x-ray today showed no signs of pneumonia.

## 2020-03-03 NOTE — ED Provider Notes (Signed)
[place]   [id number] 03/03/20 Arrival Time: 0913  ASSESSMENT & PLAN:  1. Viral URI with cough     I have personally viewed the imaging studies ordered this visit. No signs of PNA.  Begin trial of: Meds ordered this encounter  Medications   predniSONE (DELTASONE) 20 MG tablet    Sig: Take two tablets by mouth daily for 5 days.    Dispense:  10 tablet    Refill:  0    Continue Mucinex.  Follow-up Information    Forrest Moron, MD.   Specialty: Internal Medicine Why: If worsening or failing to improve as anticipated. Contact information: [address] [place] [address] [phone]              Reviewed expectations re: course of current medical issues. Questions answered. Outlined signs and symptoms indicating need for more acute intervention. Patient verbalized understanding. After Visit Summary given.   SUBJECTIVE: History from: patient.  [name] is a 79 y.o. female who presents with complaint of nasal congestion, post-nasal drainage, and a persistent productive cough - clear mucous; without sore throat. Onset abrupt, 6-7 d ago; without fatigue and without body aches. SOB: none. Wheezing: unsure. Fever: absent. Overall normal PO intake without n/v. Known sick contacts or COVID-19 exposure: grandchildren with colds recently. No specific or significant aggravating or alleviating factors reported. OTC treatment: Mucinex; slight help..  . Immunization History  Administered Date(s) Administered   HPV Bivalent 06/09/2018   Influenza Whole 03/30/2009, 05/06/2010   Influenza, High Dose Seasonal PF 06/16/2014   PFIZER SARS-COV-2 Vaccination 09/04/2019, 09/25/2019   Pneumococcal Conjugate-13 05/29/2019   Pneumococcal Polysaccharide-23 07/31/2007   Td 02/27/2002, 05/25/2010     Social History   Tobacco Use  Smoking Status Never Smoker  Smokeless Tobacco Never Used      OBJECTIVE:  Vitals:     03/03/20 0943  BP: 136/88  Pulse: 72  Resp: 18  Temp: 98.6 F (37 C)  TempSrc: Oral  SpO2: 100%     General appearance: alert; NAD HEENT: nasal congestion; clear runny nose; throat irritation secondary to post-nasal drainage Neck: supple without LAD Lungs: unlabored respirations, symmetrical air entry without wheezing; cough: mild Abd: soft Ext: no LE edema Skin: warm and dry Psychological: alert and cooperative; normal mood and affect  Imaging: DG Chest 2 View  Result Date: 03/03/2020 CLINICAL DATA:  Cough, shortness of breath EXAM: CHEST - 2 VIEW COMPARISON:  01/13/2015 FINDINGS: The heart size and mediastinal contours are within normal limits. Mildly prominent interstitial markings bilaterally. No focal airspace consolidation, pleural effusion, or pneumothorax. The visualized skeletal structures are unremarkable. IMPRESSION: Mildly prominent interstitial markings bilaterally, which may reflect bronchitic type lung changes. No focal airspace consolidation. Electronically Signed   By: Davina Poke D.O.   On: 03/03/2020 10:40    Allergies  Allergen Reactions   Atorvastatin     REACTION: myalgia   Penicillins Hives and Itching    Has patient had a PCN reaction causing immediate rash, facial/tongue/throat swelling, SOB or lightheadedness with hypotension: no Has patient had a PCN reaction causing severe rash involving mucus membranes or skin necrosis: no Has patient had a PCN reaction that required hospitalization : no Has patient had a PCN reaction occurring within the last 10 years: no If all of the above answers are "NO", then may proceed with Cephalosporin use.     Past Medical History:  Diagnosis Date   Acute sinus infection  01/15/2011   ANXIETY 10/03/2009   BARRETTS ESOPHAGUS 10/03/2009   CAROTID ARTERY DISEASE 08/31/2009   COLONIC POLYPS, HX OF 08/29/2009   CONSTIPATION, CHRONIC 10/03/2009   DEPRESSION 10/03/2009   DISC DISEASE, CERVICAL 08/29/2009   DISC  DISEASE, LUMBAR 08/29/2009   Eustachian tube dysfunction 01/15/2011   GERD 08/29/2009   HYPERLIPIDEMIA 08/29/2009   Impaired glucose tolerance 01/14/2011   LEG PAIN, BILATERAL 08/29/2009   LOW BACK PAIN, CHRONIC 11/22/2009   PERIPHERAL NEUROPATHY 10/03/2009   PERIPHERAL VASCULAR DISEASE 08/29/2009   POSITIVE PPD 10/03/2009   Preventative health care 01/14/2011   Stroke ([facility])    Thyroid nodule 08/09/2011   TIA 11/22/2009   TRANSIENT ISCHEMIC ATTACK, HX OF 08/29/2009   URETHRAL STRICTURE 10/03/2009   VOCAL CORD POLYP, HX OF 10/03/2009   WRIST PAIN, BILATERAL 05/25/2010   Family History  Problem Relation Age of Onset   Stroke Mother    Heart disease Mother    Hypertension Mother    Diabetes Mother    Multiple myeloma Sister    Cancer Other        lung   Social History   Socioeconomic History   Marital status: Widowed    Spouse name: Not on file   Number of children: 3   Years of education: Not on file   Highest education level: Not on file  Occupational History   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  Tobacco Use   Smoking status: Never Smoker   Smokeless tobacco: Never Used  Scientific laboratory technician Use: Never used  Substance and Sexual Activity   Alcohol use: No   Drug use: No   Sexual activity: Not Currently  Other Topics Concern   Not on file  Social History Narrative   Husband with dementia   Social Determinants of Health   Financial Resource Strain:    Difficulty of Paying Living Expenses:   Food Insecurity:    Worried About Charity fundraiser in the Last Year:    Arboriculturist in the Last Year:   Transportation Needs:    Film/video editor (Medical):    Lack of Transportation (Non-Medical):   Physical Activity:    Days of Exercise per Week:    Minutes of Exercise per Session:   Stress:    Feeling of Stress :   Social Connections:    Frequency of Communication with Friends and Family:     Frequency of Social Gatherings with Friends and Family:    Attends Religious Services:    Active Member of Clubs or Organizations:    Attends Archivist Meetings:    Marital Status:   Intimate Partner Violence:    Fear of Current or Ex-Partner:    Emotionally Abused:    Physically Abused:    Sexually Abused:            Vanessa Kick, MD 03/03/20 1114

## 2020-03-22 ENCOUNTER — Encounter: Payer: Self-pay | Admitting: Adult Health

## 2020-03-22 ENCOUNTER — Other Ambulatory Visit: Payer: Self-pay | Admitting: Family Medicine

## 2020-03-22 ENCOUNTER — Ambulatory Visit: Payer: Medicare Other | Admitting: Adult Health

## 2020-03-22 ENCOUNTER — Other Ambulatory Visit: Payer: Self-pay

## 2020-03-22 VITALS — BP 120/80 | HR 66 | Ht 66.0 in | Wt 147.0 lb

## 2020-03-22 DIAGNOSIS — Z8673 Personal history of transient ischemic attack (TIA), and cerebral infarction without residual deficits: Secondary | ICD-10-CM | POA: Diagnosis not present

## 2020-03-22 DIAGNOSIS — R2681 Unsteadiness on feet: Secondary | ICD-10-CM | POA: Diagnosis not present

## 2020-03-22 DIAGNOSIS — G44329 Chronic post-traumatic headache, not intractable: Secondary | ICD-10-CM

## 2020-03-22 DIAGNOSIS — Z1231 Encounter for screening mammogram for malignant neoplasm of breast: Secondary | ICD-10-CM

## 2020-03-22 NOTE — Progress Notes (Signed)
[facility] [address] [place]. [place] [address] [phone]       OFFICE FOLLOW UP NOTE  [name] Date of Birth:  July 04, 1941 Medical Record Number:  [id number]   Referring MD: Collie Siad Reason for Referral: Headache   Chief Complaint  Patient presents with  . Follow-up    3 month f/u. States her legs have unsteady since the last MRI.   . room 5    alone       HPI:   Today, 03/22/2020, [name] returns for follow-up regarding headache   Posttraumatic headache present since 04/2019 Continues to notice a scalp protrusion at the site of her headache which last for short duration and then subsides She does complain of 1 to 2-day residual symptoms with mild headache and "have an effect on my whole body" with imbalance and memory concerns -this has been stable since prior visit and occurs infrequently Underwent MRI w/wo on 01/11/2020 which was unremarkable  She does report since her MRI, she has had difficulty with unsteadiness but denies weakness, dizziness or lightheadedness.  She experienced symptoms immediately after MRI and has been consistent since that time.  Also experience right leg pain after MRI evaluated in the ED but this shortly subsided.  She has not been evaluated further by PCP.  She does report unintentional weight loss approximately 7 pounds since prior visit 3 months ago.  Denies dietary changes or medication changes.  History of TIA currently on Plavix and Crestor with HTN and HLD management PCP Blood pressure today 120/80  No further concerns       History provided for reference purposes only Initial consult visit 12/01/2019 Dr. Pearlean Brownie: [name] is a 79 year old lady seen today for initial office consultation visit for headache.  History is obtained from the patient, review of referral notes and electronic medical records and have personally reviewed imaging films available in PACS.  She she states she had  fallen October when she hit her forehead and developed a small right frontal scalp hematoma.  She was seen in the ER and was found to have a small hematoma in CT scan of the head personally reviewed showed mild age-appropriate changes of generalized cerebral atrophy and small vessel changes.  There are incidental thyroid nodules noted.  CT cervical spine showed multilevel degenerative disc and spondylitic changes with multilevel foraminal stenosis as well.  Patient states she has had intermittent headaches and neck pain since then.  She notices at times that she has scalp protrusion at the site of the headache it lasts for a few minutes and goes away.  When headache is severe at times she may feel off balance.  She also feels tightness in the right side of neck and posterior shoulder muscles.  She does take Tylenol or Motrin with some good effect but it takes a day or two for her symptoms to resolve.  This is happened about 4-5 times so far only.  She is unable to admit for specific triggers.  She does not feel there is any relationship between stress and this symptoms.  She denies any accompanying loss of vision, blurred vision, vertigo, gait and balance problems or slurred speech.  She had a history of TIA in November 2017 when she had left-sided weakness and sensory loss and actually saw me.  She has been on Plavix since then and has had no recurrent TIA or stroke symptoms.  She has had trouble tolerating Crestor due to muscle aches  but has been taking co-Q10 which helps somewhat.  She is tolerating Plavix without bruising or bleeding.  She is also complained of mild short-term memory difficulties in the last year or so.  She started taking Provigil in the last few months and feels it may be helping.  She is still independent and able to do all activities of daily living for herself.  She does not participate in regular activities which are cognitively challenging like solving crossword puzzles, playing bridge of  sudoku.  There is no family stable tremors.  ROS:   14 system review of systems is positive for headache, memory difficulties, gait imbalance/unsteadiness and all other systems negative  PMH:  Past Medical History:  Diagnosis Date  . Acute sinus infection 01/15/2011  . ANXIETY 10/03/2009  . BARRETTS ESOPHAGUS 10/03/2009  . CAROTID ARTERY DISEASE 08/31/2009  . COLONIC POLYPS, HX OF 08/29/2009  . CONSTIPATION, CHRONIC 10/03/2009  . DEPRESSION 10/03/2009  . DISC DISEASE, CERVICAL 08/29/2009  . DISC DISEASE, LUMBAR 08/29/2009  . Eustachian tube dysfunction 01/15/2011  . GERD 08/29/2009  . HYPERLIPIDEMIA 08/29/2009  . Impaired glucose tolerance 01/14/2011  . LEG PAIN, BILATERAL 08/29/2009  . LOW BACK PAIN, CHRONIC 11/22/2009  . PERIPHERAL NEUROPATHY 10/03/2009  . PERIPHERAL VASCULAR DISEASE 08/29/2009  . POSITIVE PPD 10/03/2009  . Preventative health care 01/14/2011  . Stroke ([facility])   . Thyroid nodule 08/09/2011  . TIA 11/22/2009  . TRANSIENT ISCHEMIC ATTACK, HX OF 08/29/2009  . URETHRAL STRICTURE 10/03/2009  . VOCAL CORD POLYP, HX OF 10/03/2009  . WRIST PAIN, BILATERAL 05/25/2010    Social History:  Social History   Socioeconomic History  . Marital status: Widowed    Spouse name: Not on file  . Number of children: 3  . Years of education: Not on file  . Highest education level: Not on file  Occupational History  . 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 Smoking status: Never Smoker  . Smokeless tobacco: Never Used  Vaping Use  . Vaping Use: Never used  Substance and Sexual Activity  . Alcohol use: No  . Drug use: No  . Sexual activity: Not Currently  Other Topics Concern  . Not on file  Social History Narrative   Husband with dementia   Social Determinants of Health   Financial Resource Strain:   . Difficulty of Paying Living Expenses: Not on file  Food Insecurity:   . Worried About Programme researcher, broadcasting/film/video in the Last Year: Not on file  . Ran Out of  Food in the Last Year: Not on file  Transportation Needs:   . Lack of Transportation (Medical): Not on file  . Lack of Transportation (Non-Medical): Not on file  Physical Activity:   . Days of Exercise per Week: Not on file  . Minutes of Exercise per Session: Not on file  Stress:   . Feeling of Stress : Not on file  Social Connections:   . Frequency of Communication with Friends and Family: Not on file  . Frequency of Social Gatherings with Friends and Family: Not on file  . Attends Religious Services: Not on file  . Active Member of Clubs or Organizations: Not on file  . Attends Banker Meetings: Not on file  . Marital Status: Not on file  Intimate Partner Violence:   . Fear of Current or Ex-Partner: Not on file  . Emotionally Abused: Not on file  . Physically Abused: Not on  file  . Sexually Abused: Not on file    Medications:   Current Outpatient Medications on File Prior to Visit  Medication Sig Dispense Refill  . acetaminophen (TYLENOL) 325 MG tablet Take 650 mg by mouth every 6 (six) hours as needed for mild pain or headache.    . clopidogrel (PLAVIX) 75 MG tablet Take 1 tablet (75 mg total) by mouth daily. 90 tablet 0  . Coenzyme Q10 10 MG capsule Take 200 mg by mouth daily.    . famotidine (PEPCID) 20 MG tablet Take 40 mg by mouth daily.    . Multiple Vitamins-Minerals (MULTIVITAMIN WITH MINERALS) tablet Take 1 tablet by mouth daily.    . naproxen (NAPROSYN) 375 MG tablet Take 1 tablet (375 mg total) by mouth 2 (two) times daily. 20 tablet 0  . Omega-3 Fatty Acids (FISH OIL) 1000 MG CAPS Take 1,000 mg by mouth daily.     . predniSONE (DELTASONE) 20 MG tablet Take two tablets by mouth daily for 5 days. 10 tablet 0  . rosuvastatin (CRESTOR) 40 MG tablet Take 1 tablet (40 mg total) by mouth daily. 90 tablet 3   No current facility-administered medications on file prior to visit.    Allergies:   Allergies  Allergen Reactions  . Atorvastatin     REACTION:  myalgia  . Penicillins Hives and Itching    Has patient had a PCN reaction causing immediate rash, facial/tongue/throat swelling, SOB or lightheadedness with hypotension: no Has patient had a PCN reaction causing severe rash involving mucus membranes or skin necrosis: no Has patient had a PCN reaction that required hospitalization : no Has patient had a PCN reaction occurring within the last 10 years: no If all of the above answers are "NO", then may proceed with Cephalosporin use.     Today's Vitals   03/22/20 0842  BP: 120/80  Pulse: 66  Weight: 147 lb (66.7 kg)  Height: 5\' 6"  (1.676 m)   Body mass index is 23.73 kg/m.   Physical Exam General: Frail pleasant elderly lady seated, in no evident distress Head: head normocephalic and atraumatic.   Neck: supple with no carotid or supraclavicular bruits Cardiovascular: regular rate and rhythm, no murmurs Musculoskeletal: no deformity Skin:  no rash/petichiae Vascular:  Normal pulses all extremities  Neurologic Exam Mental Status: Awake and fully alert. Oriented to place and time. Recent and remote memory intact. Attention span, concentration and fund of knowledge appropriate. Mood and affect appropriate.   Cranial Nerves: Pupils equal, briskly reactive to light. Extraocular movements full without nystagmus. Visual fields full to confrontation. Hearing mildly diminished bilaterally. Facial sensation intact. Face, tongue, palate moves normally and symmetrically.  Motor: Normal bulk and tone. Normal strength in all tested extremity muscles. Sensory.: intact to touch , pinprick , position and vibratory sensation.  Coordination: Rapid alternating movements normal in all extremities. Finger-to-nose and heel-to-shin performed accurately bilaterally. Gait and Station: Arises from chair without difficulty. Stance is normal. Gait demonstrates normal stride length and mild imbalance.  Difficulty with tandem walk and standing on single leg  alone Reflexes: 1+ and symmetric. Toes downgoing.      ASSESSMENT/PLAN: 79 year old lady with posttraumatic headaches referred by PCP for further evaluation.  Remote history of TIAs and stable from neurovascular standpoint.  Greatest concern today is in regards to unsteadiness present since her MRI approximately 2 months ago.    Unsteadiness -Advised to follow-up with PCP for further evaluation as symptoms possibly due to deconditioning or metabolic etiology.  Also  reports unintentional weight loss which needs to be further evaluated by PCP.  No focal deficit and low suspicion for neurological concerns. -Referral placed to PT as requested by patient  Posttraumatic headache -Stable without worsening -Infrequently occurs therefore no indication for medication management -MR brain w/wo IMPRESSION - Minimal scattered periventricular and subcortical foci of non-specific gliosis.  - No acute findings.  Hx TIA -Continue Plavix and Crestor 40 mg daily for secondary stroke prevention -Ensure close PCP follow-up for aggressive stroke risk factor management including history of HTN with BP goal<130/90 and HLD with LDL goal<70   Overall stable from neurological standpoint and recommend follow-up on an as-needed basis    I spent 30 minutes of face-to-face and non-face-to-face time with patient.  This included previsit chart review, lab review, study review, order entry, electronic health record documentation, patient education regarding concerns of unsteadiness and possible causes, posttraumatic headache and review of MRI, history of TIA and importance of managing stroke risk factors and answered all the questions to patient satisfaction  Ihor Austin, AGNP-BC  [facility] [address] [place], [place] [address]  Phone [phone] Fax [phone] Note: This document was prepared with digital dictation and possible smart phrase technology. Any  transcriptional errors that result from this process are unintentional.

## 2020-03-22 NOTE — Patient Instructions (Addendum)
Your Plan:  Recommend doing PT for unsteady gait  Recommend further discussion with your PCP for further evaluation of your gait as this should not be caused from the MRI - may need to look at lab work to ensure all satisfactory  Overall, everything is stable from a stroke standpoint. Recommend follow up on a as needed basis       Thank you for coming to see [place] at [facility]. I hope we have been able to provide you high quality care today.  You may receive a patient satisfaction survey over the next few weeks. We would appreciate your feedback and comments so that we may continue to improve ourselves and the health of our patients.

## 2020-03-24 ENCOUNTER — Other Ambulatory Visit: Payer: Self-pay | Admitting: Family Medicine

## 2020-03-24 NOTE — Telephone Encounter (Signed)
Requested medications are due for refill today?  Yes     Requested medications are on active medication list?  Yes  Last Refill:  12/30/2019  # 90 with no refills   Future visit scheduled? No   Notes to Clinic:  Medication failed RX refill protocol due to no HCT, HBG, PLT within 180 days.  Last labs were performed on 01/21/2019.

## 2020-03-30 ENCOUNTER — Other Ambulatory Visit: Payer: Self-pay | Admitting: Family Medicine

## 2020-04-27 ENCOUNTER — Other Ambulatory Visit: Payer: Self-pay

## 2020-04-27 ENCOUNTER — Encounter: Payer: Self-pay | Admitting: Podiatry

## 2020-04-27 ENCOUNTER — Ambulatory Visit (INDEPENDENT_AMBULATORY_CARE_PROVIDER_SITE_OTHER): Payer: Medicare Other | Admitting: Podiatry

## 2020-04-27 DIAGNOSIS — L84 Corns and callosities: Secondary | ICD-10-CM

## 2020-04-27 DIAGNOSIS — M79671 Pain in right foot: Secondary | ICD-10-CM

## 2020-04-27 DIAGNOSIS — M79672 Pain in left foot: Secondary | ICD-10-CM

## 2020-04-28 NOTE — Progress Notes (Signed)
Subjective: [name] is a pleasant 79 y.o. female patient seen today painful callus(es) b/l feet. Aggravating factors include weightbearing with and without shoe gear. Pain is relieved with periodic professional debridement.   She voices no new pedal concerns on today's visit.  Past Medical History:  Diagnosis Date  . Acute sinus infection 01/15/2011  . ANXIETY 10/03/2009  . BARRETTS ESOPHAGUS 10/03/2009  . CAROTID ARTERY DISEASE 08/31/2009  . COLONIC POLYPS, HX OF 08/29/2009  . CONSTIPATION, CHRONIC 10/03/2009  . DEPRESSION 10/03/2009  . DISC DISEASE, CERVICAL 08/29/2009  . DISC DISEASE, LUMBAR 08/29/2009  . Eustachian tube dysfunction 01/15/2011  . GERD 08/29/2009  . HYPERLIPIDEMIA 08/29/2009  . Impaired glucose tolerance 01/14/2011  . LEG PAIN, BILATERAL 08/29/2009  . LOW BACK PAIN, CHRONIC 11/22/2009  . PERIPHERAL NEUROPATHY 10/03/2009  . PERIPHERAL VASCULAR DISEASE 08/29/2009  . POSITIVE PPD 10/03/2009  . Preventative health care 01/14/2011  . Stroke (HCC)   . Thyroid nodule 08/09/2011  . TIA 11/22/2009  . TRANSIENT ISCHEMIC ATTACK, HX OF 08/29/2009  . URETHRAL STRICTURE 10/03/2009  . VOCAL CORD POLYP, HX OF 10/03/2009  . WRIST PAIN, BILATERAL 05/25/2010    Patient Active Problem List   Diagnosis Date Noted  . Dizziness 08/14/2017  . Chronic swimmer's ear of both sides 12/13/2016  . Acute left hemiparesis (HCC)   . TIA (transient ischemic attack) 06/01/2016  . CVA (cerebral vascular accident) (HCC) 06/01/2016  . Transient cerebral ischemia   . Pelvic prolapse 11/03/2014  . Cystocele 09/29/2014  . SUI (stress urinary incontinence, female) 09/29/2014  . Complicated migraine 08/22/2014  . Multinodular goiter 08/09/2011  . Lumbar pain with radiation down right leg 04/11/2011  . Rash 04/11/2011  . Impaired glucose tolerance 01/14/2011  . Preventative health care 01/14/2011  . WRIST PAIN, BILATERAL 05/25/2010  . TIA 11/22/2009  . LOW BACK PAIN, CHRONIC 11/22/2009  . ANXIETY  10/03/2009  . DEPRESSION 10/03/2009  . PERIPHERAL NEUROPATHY 10/03/2009  . BARRETTS ESOPHAGUS 10/03/2009  . CONSTIPATION, CHRONIC 10/03/2009  . URETHRAL STRICTURE 10/03/2009  . POSITIVE PPD 10/03/2009  . VOCAL CORD POLYP, HX OF 10/03/2009  . Carotid artery disease (HCC) 08/31/2009  . Hyperlipemia 08/29/2009  . PERIPHERAL VASCULAR DISEASE 08/29/2009  . GERD 08/29/2009  . DISC DISEASE, CERVICAL 08/29/2009  . DISC DISEASE, LUMBAR 08/29/2009  . LEG PAIN, BILATERAL 08/29/2009  . TRANSIENT ISCHEMIC ATTACK, HX OF 08/29/2009  . COLONIC POLYPS, HX OF 08/29/2009    Current Outpatient Medications on File Prior to Visit  Medication Sig Dispense Refill  . acetaminophen (TYLENOL) 325 MG tablet Take 650 mg by mouth every 6 (six) hours as needed for mild pain or headache.    . alendronate (FOSAMAX) 70 MG tablet Take by mouth as directed.    . clopidogrel (PLAVIX) 75 MG tablet TAKE 1 TABLET BY MOUTH EVERY DAY 30 tablet 0  . Coenzyme Q10 10 MG capsule Take 200 mg by mouth daily.    . famotidine (PEPCID) 20 MG tablet Take 40 mg by mouth daily.    . Multiple Vitamins-Minerals (MULTIVITAMIN WITH MINERALS) tablet Take 1 tablet by mouth daily.    . naproxen (NAPROSYN) 375 MG tablet Take 1 tablet (375 mg total) by mouth 2 (two) times daily. 20 tablet 0  . Omega-3 Fatty Acids (FISH OIL) 1000 MG CAPS Take 1,000 mg by mouth daily.     . predniSONE (DELTASONE) 20 MG tablet Take two tablets by mouth daily for 5 days. 10 tablet 0  . rosuvastatin (CRESTOR) 40 MG tablet  Take 1 tablet (40 mg total) by mouth daily. 90 tablet 3   No current facility-administered medications on file prior to visit.    Allergies  Allergen Reactions  . Atorvastatin     REACTION: myalgia  . Penicillins Hives and Itching    Has patient had a PCN reaction causing immediate rash, facial/tongue/throat swelling, SOB or lightheadedness with hypotension: no Has patient had a PCN reaction causing severe rash involving mucus membranes or  skin necrosis: no Has patient had a PCN reaction that required hospitalization : no Has patient had a PCN reaction occurring within the last 10 years: no If all of the above answers are "NO", then may proceed with Cephalosporin use.     Objective: Physical Exam  General: Shykeria Sakamoto is a pleasant 79 y.o. African American female, WD, WN in NAD. AAO x 3.   Vascular:  Neurovascular status unchanged b/l lower extremities. Capillary refill time to digits immediate b/l. Palpable pedal pulses b/l LE. Pedal hair present. Lower extremity skin temperature gradient within normal limits. No pain with calf compression b/l. No edema noted b/l lower extremities.  Dermatological:  Pedal skin with normal turgor, texture and tone bilaterally. No open wounds bilaterally. No interdigital macerations bilaterally. Toenails 1-5 b/l well maintained with adequate length. No erythema, no edema, no drainage, no flocculence. Hyperkeratotic lesion(s) submet head 2 left foot, submet head 3 left foot, submet head 4 left foot and submet head 5 right foot.  No erythema, no edema, no drainage, no flocculence.  Musculoskeletal:  Normal muscle strength 5/5 to all lower extremity muscle groups bilaterally. No pain crepitus or joint limitation noted with ROM b/l. No gross bony deformities bilaterally. Patient ambulates independent of any assistive aids.  Neurological:  Protective sensation intact 5/5 intact bilaterally with 10g monofilament b/l. Vibratory sensation intact b/l. Proprioception intact bilaterally.  Assessment and Plan:  1. Callus   2. Pain in both feet    -Examined patient. -No new findings. No new orders. -Callus(es) submet head 2 left foot, submet head 3 left foot, submet head 4 left foot and submet head 5 right foot pared utilizing sterile scalpel blade without complication or incident. Total number debrided =4. -Patient to report any pedal injuries to medical professional immediately. -Patient  to continue soft, supportive shoe gear daily. -Patient/POA to call should there be question/concern in the interim.  Return in about 3 months (around 07/27/2020).  Freddie Breech, DPM

## 2020-05-05 ENCOUNTER — Ambulatory Visit: Payer: Medicare Other | Attending: Adult Health

## 2020-05-05 ENCOUNTER — Other Ambulatory Visit: Payer: Self-pay

## 2020-05-05 DIAGNOSIS — R296 Repeated falls: Secondary | ICD-10-CM | POA: Insufficient documentation

## 2020-05-05 DIAGNOSIS — R2689 Other abnormalities of gait and mobility: Secondary | ICD-10-CM | POA: Insufficient documentation

## 2020-05-05 DIAGNOSIS — M6281 Muscle weakness (generalized): Secondary | ICD-10-CM | POA: Insufficient documentation

## 2020-05-05 DIAGNOSIS — M545 Low back pain, unspecified: Secondary | ICD-10-CM | POA: Diagnosis present

## 2020-05-05 DIAGNOSIS — R2681 Unsteadiness on feet: Secondary | ICD-10-CM | POA: Diagnosis not present

## 2020-05-05 NOTE — Therapy (Signed)
[name], PT 05/05/20 1:24 PM  [facility] [address] [place], [place], [address] Phone: [phone]   Fax:  [phone]  Name: [name] MRN: [id number] Date of Birth: [birth year]  [name], PT 05/05/20 1:24 PM  [facility] [address] [place], [place], [address] Phone: [phone]   Fax:  [phone]  Name: [name] MRN: [id number] Date of Birth: [birth year]  [facility] [address], [address] [place], [place], [address] Phone: [phone]   Fax:  [phone]  Physical Therapy Evaluation  Patient Details  Name: [name] MRN: [id number] Date of Birth: Feb 25, 1941 Referring Provider (PT): Ihor Austin, MD   Encounter Date: 05/05/2020   PT End of Session - 05/05/20 1321    Visit Number 1    Date for PT Re-Evaluation 06/16/20    Authorization Type [employer] Medicare    Progress Note Due on Visit 10    PT Start Time 1232    PT Stop Time 1310    PT Time Calculation (min) 38 min    Activity Tolerance Patient tolerated treatment well    Behavior During Therapy [facility] for tasks assessed/performed           Past Medical History:  Diagnosis Date  . Acute sinus infection 01/15/2011  . ANXIETY 10/03/2009  . BARRETTS ESOPHAGUS 10/03/2009  . CAROTID ARTERY DISEASE 08/31/2009  . COLONIC POLYPS, HX OF 08/29/2009  . CONSTIPATION, CHRONIC 10/03/2009  . DEPRESSION 10/03/2009  . DISC DISEASE, CERVICAL 08/29/2009  . DISC DISEASE, LUMBAR 08/29/2009  . Eustachian tube dysfunction 01/15/2011  . GERD 08/29/2009  . HYPERLIPIDEMIA 08/29/2009  . Impaired glucose tolerance 01/14/2011  . LEG PAIN, BILATERAL 08/29/2009  . LOW BACK PAIN, CHRONIC 11/22/2009  . PERIPHERAL NEUROPATHY 10/03/2009  . PERIPHERAL VASCULAR DISEASE 08/29/2009  . POSITIVE PPD 10/03/2009  . Preventative health care 01/14/2011  . Stroke (HCC)   . Thyroid nodule 08/09/2011  . TIA 11/22/2009  . TRANSIENT ISCHEMIC ATTACK, HX OF 08/29/2009  . URETHRAL STRICTURE 10/03/2009  . VOCAL CORD POLYP, HX OF 10/03/2009  . WRIST PAIN, BILATERAL 05/25/2010    Past Surgical History:  Procedure Laterality Date  . hx of right ankle fracture    . s/p vocal cord polyps  1984   chronic hoarseness  . stress test negative  2003,2004,2006  . TUBAL LIGATION    . tubaligation    . uterine prolapse      There were no vitals filed for this visit.    Subjective  Assessment - 05/05/20 1236    Subjective I started to feel unsteady after a fall 2 years ago.  I did therapy here before and I haven't been doing the balance exercises.  I had a recent CT scan and MD sent me back here.  I have been walking daily and have been walking 69miles/day and my balance feels much better.    Diagnostic tests CT scan    Patient Stated Goals learn exercises for balance    Currently in Pain? No/denies              [facility] PT Assessment - 05/05/20 0001      Assessment   Medical Diagnosis unsteady gait    Referring Provider (PT) Ihor Austin, MD    Onset Date/Surgical Date 05/05/18    Next MD Visit none    Prior Therapy at this clinic for balance      Precautions   Precautions Fall      Restrictions   Weight Bearing Restrictions No      Balance Screen   Has the patient fallen in the past 6 months No    Has the patient had a decrease in activity level because of a fear of falling?  No    Is the patient reluctant to leave their home because of a fear of falling?  No  [facility] [address], [address] [place], [place], [address] Phone: [phone]   Fax:  [phone]  Physical Therapy Evaluation  Patient Details  Name: [name] MRN: [id number] Date of Birth: Feb 25, 1941 Referring Provider (PT): Ihor Austin, MD   Encounter Date: 05/05/2020   PT End of Session - 05/05/20 1321    Visit Number 1    Date for PT Re-Evaluation 06/16/20    Authorization Type UHC Medicare    Progress Note Due on Visit 10    PT Start Time 1232    PT Stop Time 1310    PT Time Calculation (min) 38 min    Activity Tolerance Patient tolerated treatment well    Behavior During Therapy [facility] for tasks assessed/performed           Past Medical History:  Diagnosis Date  . Acute sinus infection 01/15/2011  . ANXIETY 10/03/2009  . BARRETTS ESOPHAGUS 10/03/2009  . CAROTID ARTERY DISEASE 08/31/2009  . COLONIC POLYPS, HX OF 08/29/2009  . CONSTIPATION, CHRONIC 10/03/2009  . DEPRESSION 10/03/2009  . DISC DISEASE, CERVICAL 08/29/2009  . DISC DISEASE, LUMBAR 08/29/2009  . Eustachian tube dysfunction 01/15/2011  . GERD 08/29/2009  . HYPERLIPIDEMIA 08/29/2009  . Impaired glucose tolerance 01/14/2011  . LEG PAIN, BILATERAL 08/29/2009  . LOW BACK PAIN, CHRONIC 11/22/2009  . PERIPHERAL NEUROPATHY 10/03/2009  . PERIPHERAL VASCULAR DISEASE 08/29/2009  . POSITIVE PPD 10/03/2009  . Preventative health care 01/14/2011  . Stroke (HCC)   . Thyroid nodule 08/09/2011  . TIA 11/22/2009  . TRANSIENT ISCHEMIC ATTACK, HX OF 08/29/2009  . URETHRAL STRICTURE 10/03/2009  . VOCAL CORD POLYP, HX OF 10/03/2009  . WRIST PAIN, BILATERAL 05/25/2010    Past Surgical History:  Procedure Laterality Date  . hx of right ankle fracture    . s/p vocal cord polyps  1984   chronic hoarseness  . stress test negative  2003,2004,2006  . TUBAL LIGATION    . tubaligation    . uterine prolapse      There were no vitals filed for this visit.    Subjective  Assessment - 05/05/20 1236    Subjective I started to feel unsteady after a fall 2 years ago.  I did therapy here before and I haven't been doing the balance exercises.  I had a recent CT scan and MD sent me back here.  I have been walking daily and have been walking 69miles/day and my balance feels much better.    Diagnostic tests CT scan    Patient Stated Goals learn exercises for balance    Currently in Pain? No/denies              [facility] PT Assessment - 05/05/20 0001      Assessment   Medical Diagnosis unsteady gait    Referring Provider (PT) Ihor Austin, MD    Onset Date/Surgical Date 05/05/18    Next MD Visit none    Prior Therapy at this clinic for balance      Precautions   Precautions Fall      Restrictions   Weight Bearing Restrictions No      Balance Screen   Has the patient fallen in the past 6 months No    Has the patient had a decrease in activity level because of a fear of falling?  No    Is the patient reluctant to leave their home because of a fear of falling?  No

## 2020-05-05 NOTE — Patient Instructions (Signed)
Access Code: [email or web address] URL: [email or web address] Date: 05/05/2020 Prepared by: Tresa Endo  Exercises Heel Toe Raises with Counter Support - 1 x daily - 7 x weekly - 10 reps - 1 sets Standing Hip Abduction - 1 x daily - 7 x weekly - 10 reps - 1 sets Standing Hip Extension - 1 x daily - 7 x weekly - 10 reps - 1 sets Tandem Stance with Support - 1 x daily - 7 x weekly - 6 reps - 1 sets - 10 hold Standing Single Leg Stance with Counter Support - 1 x daily - 7 x weekly - 6 reps - 1 sets - 10 hold Step Taps with Unilateral Counter Support - 1 x daily - 7 x weekly - 10 reps - 2 sets Forward Step Up with Counter Support - 1 x daily - 7 x weekly - 10 reps - 2 sets

## 2020-05-12 ENCOUNTER — Ambulatory Visit: Payer: Medicare Other

## 2020-05-12 ENCOUNTER — Other Ambulatory Visit: Payer: Self-pay

## 2020-05-12 DIAGNOSIS — R2681 Unsteadiness on feet: Secondary | ICD-10-CM

## 2020-05-12 DIAGNOSIS — R2689 Other abnormalities of gait and mobility: Secondary | ICD-10-CM

## 2020-05-12 DIAGNOSIS — M6281 Muscle weakness (generalized): Secondary | ICD-10-CM

## 2020-05-12 NOTE — Therapy (Signed)
[facility] [address] [place], [place], [address] Phone: [phone]   Fax:  [phone]  Physical Therapy Treatment  Patient Details  Name: [name] MRN: [id number] Date of Birth: 07/01/1941 Referring Provider (PT): Ihor Austin, MD   Encounter Date: 05/12/2020   PT End of Session - 05/12/20 1301    Visit Number 2    Date for PT Re-Evaluation 06/16/20    Authorization Type [employer] Medicare    Progress Note Due on Visit 10    PT Start Time 1229    PT Stop Time 1304    PT Time Calculation (min) 35 min    Activity Tolerance Patient tolerated treatment well    Behavior During Therapy [facility] for tasks assessed/performed           Past Medical History:  Diagnosis Date  . Acute sinus infection 01/15/2011  . ANXIETY 10/03/2009  . BARRETTS ESOPHAGUS 10/03/2009  . CAROTID ARTERY DISEASE 08/31/2009  . COLONIC POLYPS, HX OF 08/29/2009  . CONSTIPATION, CHRONIC 10/03/2009  . DEPRESSION 10/03/2009  . DISC DISEASE, CERVICAL 08/29/2009  . DISC DISEASE, LUMBAR 08/29/2009  . Eustachian tube dysfunction 01/15/2011  . GERD 08/29/2009  . HYPERLIPIDEMIA 08/29/2009  . Impaired glucose tolerance 01/14/2011  . LEG PAIN, BILATERAL 08/29/2009  . LOW BACK PAIN, CHRONIC 11/22/2009  . PERIPHERAL NEUROPATHY 10/03/2009  . PERIPHERAL VASCULAR DISEASE 08/29/2009  . POSITIVE PPD 10/03/2009  . Preventative health care 01/14/2011  . Stroke (HCC)   . Thyroid nodule 08/09/2011  . TIA 11/22/2009  . TRANSIENT ISCHEMIC ATTACK, HX OF 08/29/2009  . URETHRAL STRICTURE 10/03/2009  . VOCAL CORD POLYP, HX OF 10/03/2009  . WRIST PAIN, BILATERAL 05/25/2010    Past Surgical History:  Procedure Laterality Date  . hx of right ankle fracture    . s/p vocal cord polyps  1984   chronic hoarseness  . stress test negative  2003,2004,2006  . TUBAL LIGATION    . tubaligation    . uterine prolapse      There were no vitals filed for this visit.   Subjective  Assessment - 05/12/20 1228    Subjective I haven't been doing my exercises this week.    Currently in Pain? No/denies                             [facility] Adult PT Treatment/Exercise - 05/12/20 0001      Exercises   Exercises Knee/Hip;Ankle      Knee/Hip Exercises: Aerobic   Nustep Level 2x 8 minutes    PT present to monitor for fatigue     Knee/Hip Exercises: Standing   Hip Abduction Stengthening;Both;2 sets;10 reps;Knee straight    Hip Extension Stengthening;Both;2 sets;10 reps;Knee straight    Forward Step Up Both;10 reps    SLS 3x10 seconds Rt and LT    Other Standing Knee Exercises alternating step taps on 6" step with min UE support x20    Other Standing Knee Exercises tandem stance: Rt and Lt 3x10 seconds each      Ankle Exercises: Standing   Heel Raises 20 reps    Toe Raise 20 reps                    PT Short Term Goals - 05/05/20 1234      PT SHORT TERM GOAL #1   Title pt independent with initial HEP working on hip strength and balance  migraine  08/22/2014  . Multinodular goiter 08/09/2011  . Lumbar pain with radiation down right leg 04/11/2011  . Rash 04/11/2011  . Impaired glucose tolerance 01/14/2011  . Preventative health care 01/14/2011  . WRIST PAIN, BILATERAL 05/25/2010  . TIA 11/22/2009  . LOW BACK PAIN, CHRONIC 11/22/2009  . ANXIETY 10/03/2009  . DEPRESSION 10/03/2009  . PERIPHERAL NEUROPATHY 10/03/2009  . BARRETTS ESOPHAGUS 10/03/2009  . CONSTIPATION, CHRONIC 10/03/2009  . URETHRAL STRICTURE 10/03/2009  . POSITIVE PPD 10/03/2009  . VOCAL CORD POLYP, HX OF 10/03/2009  . Carotid artery disease (HCC) 08/31/2009  . Hyperlipemia 08/29/2009  . PERIPHERAL VASCULAR DISEASE 08/29/2009  . GERD 08/29/2009  . DISC DISEASE, CERVICAL 08/29/2009  . DISC DISEASE, LUMBAR 08/29/2009  . LEG PAIN, BILATERAL 08/29/2009  . TRANSIENT ISCHEMIC ATTACK, HX OF 08/29/2009  . COLONIC POLYPS, HX OF 08/29/2009    [name], PT 05/12/20 1:03 PM  [facility] [address] [place], [place], [address] Phone: [phone]   Fax:  [phone]  Name: [name] MRN: [id number] Date of Birth: [birth year]  migraine  08/22/2014  . Multinodular goiter 08/09/2011  . Lumbar pain with radiation down right leg 04/11/2011  . Rash 04/11/2011  . Impaired glucose tolerance 01/14/2011  . Preventative health care 01/14/2011  . WRIST PAIN, BILATERAL 05/25/2010  . TIA 11/22/2009  . LOW BACK PAIN, CHRONIC 11/22/2009  . ANXIETY 10/03/2009  . DEPRESSION 10/03/2009  . PERIPHERAL NEUROPATHY 10/03/2009  . BARRETTS ESOPHAGUS 10/03/2009  . CONSTIPATION, CHRONIC 10/03/2009  . URETHRAL STRICTURE 10/03/2009  . POSITIVE PPD 10/03/2009  . VOCAL CORD POLYP, HX OF 10/03/2009  . Carotid artery disease (HCC) 08/31/2009  . Hyperlipemia 08/29/2009  . PERIPHERAL VASCULAR DISEASE 08/29/2009  . GERD 08/29/2009  . DISC DISEASE, CERVICAL 08/29/2009  . DISC DISEASE, LUMBAR 08/29/2009  . LEG PAIN, BILATERAL 08/29/2009  . TRANSIENT ISCHEMIC ATTACK, HX OF 08/29/2009  . COLONIC POLYPS, HX OF 08/29/2009    [name], PT 05/12/20 1:03 PM  [facility] [address] [place], [place], [address] Phone: [phone]   Fax:  [phone]  Name: [name] MRN: [id number] Date of Birth: [birth year]

## 2020-05-18 ENCOUNTER — Encounter: Payer: Medicare Other | Admitting: Physical Therapy

## 2020-05-25 ENCOUNTER — Ambulatory Visit: Payer: Medicare Other | Admitting: Physical Therapy

## 2020-05-25 ENCOUNTER — Encounter: Payer: Self-pay | Admitting: Physical Therapy

## 2020-05-25 ENCOUNTER — Other Ambulatory Visit: Payer: Self-pay

## 2020-05-25 DIAGNOSIS — M545 Low back pain, unspecified: Secondary | ICD-10-CM

## 2020-05-25 DIAGNOSIS — R2689 Other abnormalities of gait and mobility: Secondary | ICD-10-CM

## 2020-05-25 DIAGNOSIS — R2681 Unsteadiness on feet: Secondary | ICD-10-CM | POA: Diagnosis not present

## 2020-05-25 DIAGNOSIS — M6281 Muscle weakness (generalized): Secondary | ICD-10-CM

## 2020-05-25 DIAGNOSIS — R296 Repeated falls: Secondary | ICD-10-CM

## 2020-05-25 NOTE — Therapy (Addendum)
[facility] [address], [address] [place], [place], [address] Phone: [phone]   Fax:  [phone]  Physical Therapy Treatment  Patient Details  Name: [name] MRN: [id number] Date of Birth: [birth year] Referring Provider (PT): Ihor Austin, MD   Encounter Date: 05/25/2020   PT End of Session - 05/25/20 1230    Visit Number 3    Date for PT Re-Evaluation 06/16/20    Authorization Type [employer] Medicare    Progress Note Due on Visit 10    PT Start Time 1230    PT Stop Time 1308    PT Time Calculation (min) 38 min    Activity Tolerance Patient tolerated treatment well    Behavior During Therapy [facility] for tasks assessed/performed           Past Medical History:  Diagnosis Date  . Acute sinus infection 01/15/2011  . ANXIETY 10/03/2009  . BARRETTS ESOPHAGUS 10/03/2009  . CAROTID ARTERY DISEASE 08/31/2009  . COLONIC POLYPS, HX OF 08/29/2009  . CONSTIPATION, CHRONIC 10/03/2009  . DEPRESSION 10/03/2009  . DISC DISEASE, CERVICAL 08/29/2009  . DISC DISEASE, LUMBAR 08/29/2009  . Eustachian tube dysfunction 01/15/2011  . GERD 08/29/2009  . HYPERLIPIDEMIA 08/29/2009  . Impaired glucose tolerance 01/14/2011  . LEG PAIN, BILATERAL 08/29/2009  . LOW BACK PAIN, CHRONIC 11/22/2009  . PERIPHERAL NEUROPATHY 10/03/2009  . PERIPHERAL VASCULAR DISEASE 08/29/2009  . POSITIVE PPD 10/03/2009  . Preventative health care 01/14/2011  . Stroke (HCC)   . Thyroid nodule 08/09/2011  . TIA 11/22/2009  . TRANSIENT ISCHEMIC ATTACK, HX OF 08/29/2009  . URETHRAL STRICTURE 10/03/2009  . VOCAL CORD POLYP, HX OF 10/03/2009  . WRIST PAIN, BILATERAL 05/25/2010    Past Surgical History:  Procedure Laterality Date  . hx of right ankle fracture    . s/p vocal cord polyps  1984   chronic hoarseness  . stress test negative  2003,2004,2006  . TUBAL LIGATION    . tubaligation    . uterine prolapse      There were no vitals filed for this visit.   Subjective  Assessment - 05/25/20 1234    Subjective I started walking every day and i am up to 2 miles.    Currently in Pain? No/denies    Multiple Pain Sites No                             [facility] Adult PT Treatment/Exercise - 05/25/20 0001      Neuro Re-ed    Neuro Re-ed Details  Agaility ladder balance activities: stepping over objects, weight shifting on mini tramp 3 ways with light UE      Knee/Hip Exercises: Aerobic   Nustep L2 x 10 min with discussion of status      Knee/Hip Exercises: Standing   Heel Raises Both;1 set;20 reps    Hip Abduction Stengthening;Both;1 set;10 reps;Knee straight    Abduction Limitations 2#    Hip Extension Stengthening;Both;1 set;10 reps;Knee straight    Extension Limitations 2#      Knee/Hip Exercises: Seated   Clamshell with Thera[employer] --   yellow loop 20x   Sit to Sand 1 set;10 reps;without UE support                    PT Short Term Goals - 05/05/20 1234      PT SHORT TERM GOAL #1   Title pt independent with initial HEP  working on hip strength and balance    Time 2    Period Weeks    Status New    Target Date 05/19/20      PT SHORT TERM GOAL #2   Title perform single leg stance x 5 seconds Rt and Lt without UE support to improve balance    Time 2    Period Weeks    Status New    Target Date 05/19/20      PT SHORT TERM GOAL #3   Title --    Baseline --      PT SHORT TERM GOAL #4   Title --             PT Long Term Goals - 05/05/20 1315      PT LONG TERM GOAL #1   Title be independent in advanced HEP for balance    Time 6    Period Weeks    Status New    Target Date 06/16/20      PT LONG TERM GOAL #2   Title perform 5x sit to stand in < or = to 9 seconds to reduce falls risk    Baseline --    Time 6    Period Weeks    Status New    Target Date 06/16/20      PT LONG TERM GOAL #3   Title perform single leg stance on Rt and Lt x 8 seconds to improve stability    Time 6    Period Weeks    Status  New    Target Date 06/16/20      PT LONG TERM GOAL #4   Title --                 Plan - 05/25/20 1239    Clinical Impression Statement Pt has begun walking daily and is currently up to 2 miles. Pt tolerated 2# on LE strengthening ex today. pt unsteady with hihg knee marchin gon the agility ladder but better with just regular forward marching. Mild fatigue at end of session but reasonable.    Personal Factors and Comorbidities Comorbidity 2    Comorbidities TIA, peripheral neuropathy    Examination-Activity Limitations Stairs;Stand;Locomotion Level    Examination-Participation Restrictions Church;Cleaning;Community Activity    Stability/Clinical Decision Making Stable/Uncomplicated    Rehab Potential Excellent    PT Frequency 1x / week    PT Duration 6 weeks    PT Treatment/Interventions ADLs/Self Care Home Management;Gait training;Stair training;Functional mobility training;Therapeutic activities;Therapeutic exercise;Balance training;Neuromuscular re-education;Patient/family education;Manual techniques    PT Next Visit Plan review HEP, agility ladder, work on change of directions    PT Home Exercise Plan Access Code: [email or web address]    Consulted and Agree with Plan of Care Patient           Patient will benefit from skilled therapeutic intervention in order to improve the following deficits and impairments:  Abnormal gait, Decreased balance  Visit Diagnosis: Unsteadiness on feet  Other abnormalities of gait and mobility  Muscle weakness (generalized)  Acute bilateral low back pain without sciatica  Repeated falls     Problem List Patient Active Problem List   Diagnosis Date Noted  . Dizziness 08/14/2017  . Chronic swimmer's ear of both sides 12/13/2016  . Acute left hemiparesis (HCC)   . TIA (transient ischemic attack) 06/01/2016  . CVA (cerebral vascular accident) (HCC) 06/01/2016  . Transient cerebral ischemia   . Pelvic prolapse 11/03/2014  . Cystocele  09/29/2014  .  SUI (stress urinary incontinence, female) 09/29/2014  . Complicated migraine 08/22/2014  . Multinodular goiter 08/09/2011  . Lumbar pain with radiation down right leg 04/11/2011  . Rash 04/11/2011  . Impaired glucose tolerance 01/14/2011  . Preventative health care 01/14/2011  . WRIST PAIN, BILATERAL 05/25/2010  . TIA 11/22/2009  . LOW BACK PAIN, CHRONIC 11/22/2009  . ANXIETY 10/03/2009  . DEPRESSION 10/03/2009  . PERIPHERAL NEUROPATHY 10/03/2009  . BARRETTS ESOPHAGUS 10/03/2009  . CONSTIPATION, CHRONIC 10/03/2009  . URETHRAL STRICTURE 10/03/2009  . POSITIVE PPD 10/03/2009  . VOCAL CORD POLYP, HX OF 10/03/2009  . Carotid artery disease (HCC) 08/31/2009  . Hyperlipemia 08/29/2009  . PERIPHERAL VASCULAR DISEASE 08/29/2009  . GERD 08/29/2009  . DISC DISEASE, CERVICAL 08/29/2009  . DISC DISEASE, LUMBAR 08/29/2009  . LEG PAIN, BILATERAL 08/29/2009  . TRANSIENT ISCHEMIC ATTACK, HX OF 08/29/2009  . COLONIC POLYPS, HX OF 08/29/2009    Yatzari Jonsson, :PTA 05/25/2020, 1:12 PM PHYSICAL THERAPY DISCHARGE SUMMARY  Visits from Start of Care: 3  Current functional level related to goals / functional outcomes: See above.  Pt didn't return to PT.     Remaining deficits: See above for most current status.     Education / Equipment: HEP Plan: Patient agrees to discharge.  Patient goals were not met. Patient is being discharged due to not returning since the last visit.  ?????        [name], PT 07/06/20 9:01 AM  [facility] [address], [address] [place], [place], [address] Phone: [phone]   Fax:  [phone]  Name: [name] MRN: [id number] Date of Birth: 06/11/1941

## 2020-05-27 ENCOUNTER — Ambulatory Visit: Payer: Medicare Other

## 2020-05-27 ENCOUNTER — Other Ambulatory Visit: Payer: Self-pay

## 2020-05-27 DIAGNOSIS — I6523 Occlusion and stenosis of bilateral carotid arteries: Secondary | ICD-10-CM

## 2020-06-03 ENCOUNTER — Ambulatory Visit (INDEPENDENT_AMBULATORY_CARE_PROVIDER_SITE_OTHER): Payer: Medicare Other | Admitting: Cardiology

## 2020-06-03 ENCOUNTER — Other Ambulatory Visit: Payer: Self-pay

## 2020-06-03 ENCOUNTER — Encounter: Payer: Self-pay | Admitting: Cardiology

## 2020-06-03 VITALS — BP 130/80 | HR 66 | Ht 66.0 in | Wt 147.0 lb

## 2020-06-03 DIAGNOSIS — I10 Essential (primary) hypertension: Secondary | ICD-10-CM

## 2020-06-03 DIAGNOSIS — I6523 Occlusion and stenosis of bilateral carotid arteries: Secondary | ICD-10-CM

## 2020-06-03 DIAGNOSIS — E7800 Pure hypercholesterolemia, unspecified: Secondary | ICD-10-CM

## 2020-06-03 MED ORDER — NAPROXEN 375 MG PO TABS: 375.0000 mg | ORAL_TABLET | ORAL | 0 refills | Status: DC | PRN

## 2020-06-03 NOTE — Progress Notes (Signed)
Primary Physician/Referring:  Doristine Bosworth, MD  Patient ID: [name], female    DOB: May 17, 1941, 79 y.o.   MRN: [id number]  Chief Complaint  Patient presents with  . Hypertension    1 year follow up  . Carotid Stenosis   HPI:    HPI: [name]  is a 79 y.o. with h/o hyperlipidemia, stroke on 06/03/2016 with left hemiparesis. She has very mild residual deficits with speach and most of her leg weakness has now resolved.  She is followed in our office for hypertension, carotid artery stenosis, hyperlipidemia.  Patient presents for annual follow-up of hyperlipidemia, hypertension, bilateral carotid artery stenosis.  She is presently doing well, without symptoms.  Denies chest pain, dizziness, shortness of breath, swelling.  She continues to monitor her blood pressure on a regular basis at home, reporting readings averaging 115/90.  She is presently walking 2 miles 5-6 days/week, without issue.   Past Medical History:  Diagnosis Date  . Acute sinus infection 01/15/2011  . ANXIETY 10/03/2009  . BARRETTS ESOPHAGUS 10/03/2009  . CAROTID ARTERY DISEASE 08/31/2009  . COLONIC POLYPS, HX OF 08/29/2009  . CONSTIPATION, CHRONIC 10/03/2009  . DEPRESSION 10/03/2009  . DISC DISEASE, CERVICAL 08/29/2009  . DISC DISEASE, LUMBAR 08/29/2009  . Eustachian tube dysfunction 01/15/2011  . GERD 08/29/2009  . HYPERLIPIDEMIA 08/29/2009  . Impaired glucose tolerance 01/14/2011  . LEG PAIN, BILATERAL 08/29/2009  . LOW BACK PAIN, CHRONIC 11/22/2009  . PERIPHERAL NEUROPATHY 10/03/2009  . PERIPHERAL VASCULAR DISEASE 08/29/2009  . POSITIVE PPD 10/03/2009  . Preventative health care 01/14/2011  . Stroke (HCC)   . Thyroid nodule 08/09/2011  . TIA 11/22/2009  . TRANSIENT ISCHEMIC ATTACK, HX OF 08/29/2009  . URETHRAL STRICTURE 10/03/2009  . VOCAL CORD POLYP, HX OF 10/03/2009  . WRIST PAIN, BILATERAL 05/25/2010   Past Surgical History:  Procedure Laterality Date  . hx of right ankle fracture    . s/p vocal cord  polyps  1984   chronic hoarseness  . stress test negative  2003,2004,2006  . TUBAL LIGATION    . tubaligation    . uterine prolapse     Social History   Tobacco Use  . Smoking status: Never Smoker  . Smokeless tobacco: Never Used  Substance Use Topics  . Alcohol use: No   Marital Status: Widowed  ROS  Review of Systems  Constitutional: Negative for decreased appetite, malaise/fatigue, weight gain and weight loss.  Eyes: Negative for visual disturbance.  Cardiovascular: Negative for chest pain, claudication, dyspnea on exertion, leg swelling, near-syncope, orthopnea, palpitations, paroxysmal nocturnal dyspnea and syncope.  Respiratory: Negative for hemoptysis, shortness of breath and wheezing.   Endocrine: Negative for cold intolerance and heat intolerance.  Hematologic/Lymphatic: Does not bruise/bleed easily.  Skin: Negative for nail changes.  Musculoskeletal: Positive for back pain and joint pain (left knee). Negative for muscle weakness and myalgias.  Gastrointestinal: Negative for abdominal pain, change in bowel habit, melena, nausea and vomiting.  Neurological: Negative for difficulty with concentration, dizziness, focal weakness, headaches and weakness.  Psychiatric/Behavioral: Negative for altered mental status and suicidal ideas.  All other systems reviewed and are negative.  Objective  Blood pressure 130/80, pulse 66, height 5\' 6"  (1.676 m), weight 147 lb (66.7 kg). Body mass index is 23.73 kg/m.   Physical Exam Vitals reviewed.  Constitutional:      Comments: She is moderately breath and well-nourished in no acute distress.  HENT:     Head: Normocephalic and atraumatic.  Cardiovascular:  Rate and Rhythm: Normal rate and regular rhythm.     Pulses: Intact distal pulses.          Popliteal pulses are 2+ on the right side and 2+ on the left side.       Dorsalis pedis pulses are 1+ on the right side and 1+ on the left side.       Posterior tibial pulses are 1+  on the right side and 1+ on the left side.     Heart sounds: Normal heart sounds, S1 normal and S2 normal. No murmur heard.  No gallop.   Pulmonary:     Effort: Pulmonary effort is normal. No accessory muscle usage or respiratory distress.     Breath sounds: Normal breath sounds. No wheezing, rhonchi or rales.  Abdominal:     General: Bowel sounds are normal.     Palpations: Abdomen is soft.  Musculoskeletal:        General: Normal range of motion.     Cervical back: Normal range of motion.     Right lower leg: No edema.     Left lower leg: No edema.  Skin:    General: Skin is warm and dry.  Neurological:     Mental Status: She is alert and oriented to person, place, and time.    Laboratory examination:   CMP Latest Ref Rng & Units 09/02/2019 01/21/2019 06/16/2018  Glucose 65 - 99 mg/dL 85 96 87  BUN 8 - 27 mg/dL 6(L) 10 7(L)  Creatinine 0.57 - 1.00 mg/dL 2.95 6.21(H) 0.86  Sodium 134 - 144 mmol/L 146(H) 145(H) 144  Potassium 3.5 - 5.2 mmol/L 3.7 4.2 4.1  Chloride 96 - 106 mmol/L 105 105 103  CO2 20 - 29 mmol/L 27 23 24   Calcium 8.7 - 10.3 mg/dL 9.9 9.8 9.8  Total Protein 6.0 - 8.5 g/dL 7.1 6.7 6.8  Total Bilirubin 0.0 - 1.2 mg/dL 0.6 0.6 0.5  Alkaline Phos 39 - 117 IU/L 77 74 75  AST 0 - 40 IU/L 32 29 38  ALT 0 - 32 IU/L 28 21 33(H)   CBC Latest Ref Rng & Units 01/21/2019 07/18/2017 01/08/2017  WBC 3.4 - 10.8 x10E3/uL 4.3 4.1(A) 3.6(L)  Hemoglobin 11.1 - 15.9 g/dL 57.8 46.9 15.5(H)  Hematocrit 34.0 - 46.6 % 45.7 46.0 47.5(H)  Platelets 150 - 450 x10E3/uL 157 - 137(L)   Lipid Panel     Component Value Date/Time   CHOL 160 11/04/2019 1149   TRIG 106 11/04/2019 1149   HDL 55 11/04/2019 1149   CHOLHDL 2.9 11/04/2019 1149   CHOLHDL 5.6 08/22/2014 0409   VLDL 17 08/22/2014 0409   LDLCALC 86 11/04/2019 1149   LDLDIRECT 194.5 05/25/2010 1139   HEMOGLOBIN A1C Lab Results  Component Value Date   HGBA1C 5.9 03/12/2018   MPG 123 (H) 08/22/2014   TSH Recent Labs     11/04/19 1149  TSH 1.170   External Labs:  None   Medications   Current Outpatient Medications  Medication Instructions  . acetaminophen (TYLENOL) 650 mg, Oral, Every 6 hours PRN  . clopidogrel (PLAVIX) 75 MG tablet TAKE 1 TABLET BY MOUTH EVERY DAY  . Coenzyme Q10 200 mg, Oral, Daily  . famotidine (PEPCID) 40 mg, Oral, As needed  . Fish Oil 1,000 mg, Oral, Daily  . Multiple Vitamins-Minerals (MULTIVITAMIN WITH MINERALS) tablet 1 tablet, Oral, Daily  . naproxen (NAPROSYN) 375 mg, Oral, As needed  . rosuvastatin (CRESTOR) 40 mg, Oral, Daily  .  tiZANidine (ZANAFLEX) 4 mg, Oral, 3 times daily   Radiology:  No results found.   Cardiac Studies:   Carotid artery duplex 2020/06/06:  Stenosis in the right internal carotid artery (1-15%).  Stenosis in the left internal carotid artery (1-15%). Stenosis in the left external carotid artery (<50%).  Antegrade right vertebral artery flow. Antegrade left vertebral artery flow.  Compared to the study done on 06/12/2019, left ICA stenosis of 16-49% not present. There is mild heterogenous plaque noted in the bilateral carotid arteries. Follow up studies is appropriate if clinically indicated.   Lower Extremity Dopplers [11/27/2016]: No hemodynamically significant stenoses are identified in the lower extremity arterial system. This exam reveals normal perfusion of the lower extremity. Bilateral ABI 1.0. Biphasic waveform in the anterior tibial vessel suggests mild diffuse disease.  Echocardiogram [06/03/2016]: Hospital: Normal LV systolic function, EF 60-65%. Pseudo-normal left ventricle her filling pattern. No significant valvular abnormality.  Nuclear stress test [01/28/2015]: 1. The resting electrocardiogram demonstrated normal sinus rhythm, normal resting conduction, no resting arrhythmias and normal rest repolarization. The stress electrocardiogram was normal. Exercise using Bruce protocol for 5 minutes. The patient completed an estimated  workload of 7.05 METS, 88% of the maximum predicted heart rate. The stress test was terminated because of fatigue. 2. Myocardial perfusion imaging is normal. Overall left ventricular systolic function was normal without regional wall motion abnormalities. The left ventricular ejection fraction was 79%.  EKG:   EKG 06/03/2020: Sinus rhythm at a rate of 66 bpm, left atrial enlargement.  Normal axis.  Poor R wave progression, cannot exclude anteroseptal infarct old.  Nonspecific T wave abnormality.  Compared to EKG 06/05/2019, no significant change.  EKG 05/05/2018: Sinus rhythm at 70 bpm, normal axis, low-voltage complexes. No evidence of ischemia.  Assessment     ICD-10-CM   1. Bilateral carotid artery stenosis  I65.23 EKG 12-Lead  2. Primary hypertension  I10 EKG 12-Lead  3. Hypercholesteremia  E78.00     Meds ordered this encounter  Medications  . naproxen (NAPROSYN) 375 MG tablet    Sig: Take 1 tablet (375 mg total) by mouth as needed for mild pain.    Dispense:  20 tablet    Refill:  0   Medications Discontinued During This Encounter  Medication Reason  . alendronate (FOSAMAX) 70 MG tablet Completed Course  . predniSONE (DELTASONE) 20 MG tablet Completed Course  . naproxen (NAPROSYN) 375 MG tablet    Recommendations:  [name]  is a 79 y.o. with h/o hyperlipidemia, stroke on 06/03/2016 with left hemiparesis. She has very mild residual deficits with speach and most of her leg weakness has now resolved.  She is followed in our office for hypertension, carotid artery stenosis, hyperlipidemia.  She presents for annual visit and follow-up of asymptomatic bilateral carotid artery stenosis, hypertension, and hyperlipidemia.  She remains asymptomatic.  I personally reviewed results of carotid artery duplex and discussed with patient stenosis has improved compared to previous imaging.  She does have mild bilateral carotid artery stenosis, which we will continue to monitor as  clinically indicated.  However on exam today there is no bruit noted.  Also I reviewed external labs, and her lipids are well controlled.  We will continue rosuvastatin 40 mg daily.  In regard to hypertension her blood pressure is well controlled.  Was initially elevated in the office today, upon recheck it had improved.  Patient reports home blood pressure readings that are well controlled.  No changes were made to her medications today  Follow-up in 1 year for asymptomatic bilateral carotid artery stenosis, hyperlipidemia.  Patient was seen in collaboration with Dr. Jacinto Halim. He also reviewed patient's chart and Dr. Jacinto Halim is in agreement of the plan.    Rayford Halsted, PA-C 06/03/2020, 4:15 PM Office: [phone]

## 2020-08-01 ENCOUNTER — Ambulatory Visit (INDEPENDENT_AMBULATORY_CARE_PROVIDER_SITE_OTHER): Payer: Medicare PPO | Admitting: Podiatry

## 2020-08-01 ENCOUNTER — Other Ambulatory Visit: Payer: Self-pay

## 2020-08-01 ENCOUNTER — Encounter: Payer: Self-pay | Admitting: Podiatry

## 2020-08-01 DIAGNOSIS — M79672 Pain in left foot: Secondary | ICD-10-CM

## 2020-08-01 DIAGNOSIS — M79671 Pain in right foot: Secondary | ICD-10-CM | POA: Diagnosis not present

## 2020-08-01 DIAGNOSIS — L84 Corns and callosities: Secondary | ICD-10-CM

## 2020-08-01 DIAGNOSIS — I872 Venous insufficiency (chronic) (peripheral): Secondary | ICD-10-CM

## 2020-08-01 DIAGNOSIS — I8393 Asymptomatic varicose veins of bilateral lower extremities: Secondary | ICD-10-CM | POA: Diagnosis not present

## 2020-08-01 NOTE — Patient Instructions (Signed)
Varicose Veins Varicose veins are veins that have become enlarged, bulged, and twisted. They most often appear in the legs. What are the causes? This condition is caused by damage to the valves in the vein. These valves help blood return to your heart. When they are damaged and they stop working properly, blood may flow backward and back up in the veins near the skin, causing the veins to get larger and appear twisted. The condition can result from any issue that causes blood to back up, like pregnancy, prolonged standing, or obesity. What increases the risk? This condition is more likely to develop in people who are:  On their feet a lot.  Pregnant.  Overweight. What are the signs or symptoms? Symptoms of this condition include:  Bulging, twisted, and bluish veins.  A feeling of heaviness. This may be worse at the end of the day.  Leg pain. This may be worse at the end of the day.  Swelling in the leg.  Changes in skin color over the veins. How is this diagnosed? This condition may be diagnosed based on your symptoms, a physical exam, and an ultrasound test. How is this treated? Treatment for this condition may involve:  Avoiding sitting or standing in one position for long periods of time.  Wearing compression stockings. These stockings help to prevent blood clots and reduce swelling in the legs.  Raising (elevating) the legs when resting.  Losing weight.  Exercising regularly. If you have persistent symptoms or want to improve the way your varicose veins look, you may choose to have a procedure to close the varicose veins off or to remove them. Treatments to close off the veins include:  Sclerotherapy. In this treatment, a solution is injected into a vein to close it off.  Laser treatment. In this treatment, the vein is heated with a laser to close it off.  Radiofrequency vein ablation. In this treatment, an electrical current produced by radio waves is used to close  off the vein. Treatments to remove the veins include:  Phlebectomy. In this treatment, the veins are removed through small incisions made over the veins.  Vein ligation and stripping. In this treatment, incisions are made over the veins. The veins are then removed after being tied (ligated) with stitches (sutures). Follow these instructions at home: Activity  Walk as much as possible. Walking increases blood flow. This helps blood return to the heart and takes pressure off your veins. It also increases your cardiovascular strength.  Follow your health care provider's instructions about exercising.  Do not stand or sit in one position for a long period of time.  Do not sit with your legs crossed.  Rest with your legs raised during the day. General instructions   Follow any diet instructions given to you by your health care provider.  Wear compression stockings as directed by your health care provider. Do not wear other kinds of tight clothing around your legs, pelvis, or waist.  Elevate your legs at night to above the level of your heart.  If you get a cut in the skin over the varicose vein and the vein bleeds: ? Lie down with your leg raised. ? Apply firm pressure to the cut with a clean cloth until the bleeding stops. ? Place a bandage (dressing) on the cut. Contact a health care provider if:  The skin around your varicose veins starts to break down.  You have pain, redness, tenderness, or hard swelling over a vein.  You   are uncomfortable because of pain.  You get a cut in the skin over a varicose vein and it will not stop bleeding. Summary  Varicose veins are veins that have become enlarged, bulged, and twisted. They most often appear in the legs.  This condition is caused by damage to the valves in the vein. These valves help blood return to your heart.  Treatment for this condition includes frequent movements, wearing compression stockings, losing weight, and  exercising regularly. In some cases, procedures are done to close off or remove the veins.  Treatment for this condition may include wearing compression stockings, elevating the legs, losing weight, and engaging in regular activity. In some cases, procedures are done to close off or remove the veins. This information is not intended to replace advice given to you by your health care provider. Make sure you discuss any questions you have with your health care provider. Document Revised: 09/11/2018 Document Reviewed: 08/08/2016 Elsevier Patient Education  2020 [employer]   Chronic Venous Insufficiency Chronic venous insufficiency is a condition where the leg veins cannot effectively pump blood from the legs to the heart. This happens when the vein walls are either stretched, weakened, or damaged, or when the valves inside the vein are damaged. With the right treatment, you should be able to continue with an active life. This condition is also called venous stasis. What are the causes? Common causes of this condition include:  High blood pressure inside the veins (venous hypertension).  Sitting or standing too long, causing increased blood pressure in the leg veins.  A blood clot that blocks blood flow in a vein (deep vein thrombosis, DVT).  Inflammation of a vein (phlebitis) that causes a blood clot to form.  Tumors in the pelvis that cause blood to back up. What increases the risk? The following factors may make you more likely to develop this condition:  Having a family history of this condition.  Obesity.  Pregnancy.  Living without enough regular physical activity or exercise (sedentary lifestyle).  Smoking.  Having a job that requires long periods of standing or sitting in one place.  Being a certain age. Women in their 44s and 59s and men in their 44s are more likely to develop this condition. What are the signs or symptoms? Symptoms of this condition include:  Veins  that are enlarged, bulging, or twisted (varicose veins).  Skin breakdown or ulcers.  Reddened skin or dark discoloration of skin on the leg between the knee and ankle.  Brown, smooth, tight, and painful skin just above the ankle, usually on the inside of the leg (lipodermatosclerosis).  Swelling of the legs. How is this diagnosed? This condition may be diagnosed based on:  Your medical history.  A physical exam.  Tests, such as: ? A procedure that creates an image of a blood vessel and nearby organs and provides information about blood flow through the blood vessel (duplex ultrasound). ? A procedure that tests blood flow (plethysmography). ? A procedure that looks at the veins using X-ray and dye (venogram). How is this treated? The goals of treatment are to help you return to an active life and to minimize pain or disability. Treatment depends on the severity of your condition, and it may include:  Wearing compression stockings. These can help relieve symptoms and help prevent your condition from getting worse. However, they do not cure the condition.  Sclerotherapy. This procedure involves an injection of a solution that shrinks damaged veins.  Surgery. This  may involve: ? Removing a diseased vein (vein stripping). ? Cutting off blood flow through the vein (laser ablation surgery). ? Repairing or reconstructing a valve within the affected vein. Follow these instructions at home:      Wear compression stockings as told by your health care provider. These stockings help to prevent blood clots and reduce swelling in your legs.  Take over-the-counter and prescription medicines only as told by your health care provider.  Stay active by exercising, walking, or doing different activities. Ask your health care provider what activities are safe for you and how much exercise you need.  Drink enough fluid to keep your urine pale yellow.  Do not use any products that contain  nicotine or tobacco, such as cigarettes, e-cigarettes, and chewing tobacco. If you need help quitting, ask your health care provider.  Keep all follow-up visits as told by your health care provider. This is important. Contact a health care provider if you:  Have redness, swelling, or more pain in the affected area.  See a red streak or line that goes up or down from the affected area.  Have skin breakdown or skin loss in the affected area, even if the breakdown is small.  Get an injury in the affected area. Get help right away if:  You get an injury and an open wound in the affected area.  You have: ? Severe pain that does not get better with medicine. ? Sudden numbness or weakness in the foot or ankle below the affected area. ? Trouble moving your foot or ankle. ? A fever. ? Worse or persistent symptoms. ? Chest pain. ? Shortness of breath. Summary  Chronic venous insufficiency is a condition where the leg veins cannot effectively pump blood from the legs to the heart.  Chronic venous insufficiency occurs when the vein walls become stretched, weakened, or damaged, or when valves within the vein are damaged.  Treatment depends on how severe your condition is. It often involves wearing compression stockings and may involve having a procedure.  Make sure you stay active by exercising, walking, or doing different activities. Ask your health care provider what activities are safe for you and how much exercise you need. This information is not intended to replace advice given to you by your health care provider. Make sure you discuss any questions you have with your health care provider. Document Revised: 04/08/2018 Document Reviewed: 04/08/2018 Elsevier Patient Education  2020 [employer].

## 2020-08-04 NOTE — Progress Notes (Signed)
Subjective: Aleida Crandell is a pleasant 80 y.o. female patient seen today painful callus(es) b/l feet. Aggravating factors include weightbearing with and without shoe gear. Pain is relieved with periodic professional debridement.   She has questions regarding discoloration of her feet. Does not relate any new episodes of trauma.  Past Medical History:  Diagnosis Date  . Acute sinus infection 01/15/2011  . ANXIETY 10/03/2009  . BARRETTS ESOPHAGUS 10/03/2009  . CAROTID ARTERY DISEASE 08/31/2009  . COLONIC POLYPS, HX OF 08/29/2009  . CONSTIPATION, CHRONIC 10/03/2009  . DEPRESSION 10/03/2009  . DISC DISEASE, CERVICAL 08/29/2009  . DISC DISEASE, LUMBAR 08/29/2009  . Eustachian tube dysfunction 01/15/2011  . GERD 08/29/2009  . HYPERLIPIDEMIA 08/29/2009  . Impaired glucose tolerance 01/14/2011  . LEG PAIN, BILATERAL 08/29/2009  . LOW BACK PAIN, CHRONIC 11/22/2009  . PERIPHERAL NEUROPATHY 10/03/2009  . PERIPHERAL VASCULAR DISEASE 08/29/2009  . POSITIVE PPD 10/03/2009  . Preventative health care 01/14/2011  . Stroke (HCC)   . Thyroid nodule 08/09/2011  . TIA 11/22/2009  . TRANSIENT ISCHEMIC ATTACK, HX OF 08/29/2009  . URETHRAL STRICTURE 10/03/2009  . VOCAL CORD POLYP, HX OF 10/03/2009  . WRIST PAIN, BILATERAL 05/25/2010    Patient Active Problem List   Diagnosis Date Noted  . Dizziness 08/14/2017  . Chronic swimmer's ear of both sides 12/13/2016  . Acute left hemiparesis (HCC)   . TIA (transient ischemic attack) 06/01/2016  . CVA (cerebral vascular accident) (HCC) 06/01/2016  . Transient cerebral ischemia   . Pelvic prolapse 11/03/2014  . Cystocele 09/29/2014  . SUI (stress urinary incontinence, female) 09/29/2014  . Complicated migraine 08/22/2014  . Multinodular goiter 08/09/2011  . Lumbar pain with radiation down right leg 04/11/2011  . Rash 04/11/2011  . Impaired glucose tolerance 01/14/2011  . Preventative health care 01/14/2011  . WRIST PAIN, BILATERAL 05/25/2010  . TIA 11/22/2009  . LOW  BACK PAIN, CHRONIC 11/22/2009  . ANXIETY 10/03/2009  . DEPRESSION 10/03/2009  . PERIPHERAL NEUROPATHY 10/03/2009  . BARRETTS ESOPHAGUS 10/03/2009  . CONSTIPATION, CHRONIC 10/03/2009  . URETHRAL STRICTURE 10/03/2009  . POSITIVE PPD 10/03/2009  . VOCAL CORD POLYP, HX OF 10/03/2009  . Carotid artery disease (HCC) 08/31/2009  . Hyperlipemia 08/29/2009  . PERIPHERAL VASCULAR DISEASE 08/29/2009  . GERD 08/29/2009  . DISC DISEASE, CERVICAL 08/29/2009  . DISC DISEASE, LUMBAR 08/29/2009  . LEG PAIN, BILATERAL 08/29/2009  . TRANSIENT ISCHEMIC ATTACK, HX OF 08/29/2009  . COLONIC POLYPS, HX OF 08/29/2009    Current Outpatient Medications on File Prior to Visit  Medication Sig Dispense Refill  . acetaminophen (TYLENOL) 325 MG tablet Take 650 mg by mouth every 6 (six) hours as needed for mild pain or headache.    . clopidogrel (PLAVIX) 75 MG tablet TAKE 1 TABLET BY MOUTH EVERY DAY 30 tablet 0  . Coenzyme Q10 10 MG capsule Take 200 mg by mouth daily.    . cyclobenzaprine (FLEXERIL) 5 MG tablet Take 5 mg by mouth 2 (two) times daily.    . famotidine (PEPCID) 20 MG tablet Take 40 mg by mouth as needed.     . Multiple Vitamins-Minerals (MULTIVITAMIN WITH MINERALS) tablet Take 1 tablet by mouth daily.    . naproxen (NAPROSYN) 375 MG tablet Take 1 tablet (375 mg total) by mouth as needed for mild pain. 20 tablet 0  . Omega-3 Fatty Acids (FISH OIL) 1000 MG CAPS Take 1,000 mg by mouth daily.     . rosuvastatin (CRESTOR) 40 MG tablet Take 1 tablet (40 mg  total) by mouth daily. 90 tablet 3  . tiZANidine (ZANAFLEX) 4 MG tablet Take 4 mg by mouth 3 (three) times daily.     No current facility-administered medications on file prior to visit.    Allergies  Allergen Reactions  . Atorvastatin     REACTION: myalgia  . Penicillins Hives and Itching    Has patient had a PCN reaction causing immediate rash, facial/tongue/throat swelling, SOB or lightheadedness with hypotension: no Has patient had a PCN  reaction causing severe rash involving mucus membranes or skin necrosis: no Has patient had a PCN reaction that required hospitalization : no Has patient had a PCN reaction occurring within the last 10 years: no If all of the above answers are "NO", then may proceed with Cephalosporin use.     Objective: Physical Exam  General: Kealey Kemmer is a pleasant 80 y.o. African American female, WD, WN in NAD. AAO x 3.   Vascular:  Neurovascular status unchanged b/l lower extremities. Capillary refill time to digits immediate b/l. Palpable pedal pulses b/l LE. Pedal hair present. Lower extremity skin temperature gradient within normal limits. No pain with calf compression b/l. No edema noted b/l lower extremities. Varicosities present b/l. Evidence of chronic venous insufficiency b/l lower extremities.  Dermatological:  Pedal skin with normal turgor, texture and tone bilaterally. No open wounds bilaterally. No interdigital macerations bilaterally. Toenails 1-5 b/l well maintained with adequate length. No erythema, no edema, no drainage, no flocculence. Hyperkeratotic lesion(s) submet head 2 left foot, submet head 3 left foot, submet head 4 left foot and submet head 5 right foot.  No erythema, no edema, no drainage, no flocculence.  Musculoskeletal:  Normal muscle strength 5/5 to all lower extremity muscle groups bilaterally. No pain crepitus or joint limitation noted with ROM b/l. No gross bony deformities bilaterally. Patient ambulates independent of any assistive aids.  Neurological:  Protective sensation intact 5/5 intact bilaterally with 10g monofilament b/l. Vibratory sensation intact b/l. Proprioception intact bilaterally.  Assessment and Plan:  1. Callus   2. Chronic venous insufficiency   3. Asymptomatic varicose veins of both lower extremities   4. Pain in both feet    -Examined patient. -No new findings. No new orders. -Callus(es) submet head 2 left foot, submet head 3 left  foot, submet head 4 left foot and submet head 5 right foot pared utilizing sterile scalpel blade without complication or incident. Total number debrided =4. -Patient to report any pedal injuries to medical professional immediately. -Discused varicose veins and venous insufficiency. Dispensed written educational material.She does have compression stockings. -Patient/POA to call should there be question/concern in the interim.  Return in about 3 months (around 10/30/2020).  Freddie Breech, DPM

## 2020-08-08 IMAGING — CT CT CERVICAL SPINE W/O CM
3 of 4 series · 9 of 33 positions shown, 10 images · non-contrast
Comparison: CT head 06/01/2016 and earlier. No prior cervical spine
CT.

CLINICAL DATA: 77-year-old who fell when getting out of bed earlier
today, striking her forehead. Small RIGHT frontal hematoma. RIGHT
lateral cervical neck pain. Initial encounter. Patient currently
anticoagulated on Plavix.

EXAM:
CT HEAD WITHOUT CONTRAST
CT CERVICAL SPINE WITHOUT CONTRAST
TECHNIQUE: Multidetector CT imaging of the head and cervical spine was
performed following the standard protocol without intravenous
contrast. Multiplanar CT image reconstructions of the cervical spine
were also generated.

[Series 6: orthogonal bone · axial · 0.23mm/px · z∈[+1180,+1180]mm · 1 of 122 slices shown, 2 images]
[im 70/122  soft-tissue]
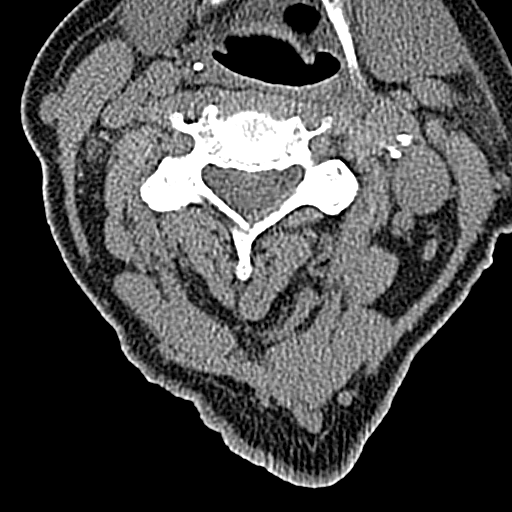
[im 70/122  bone]
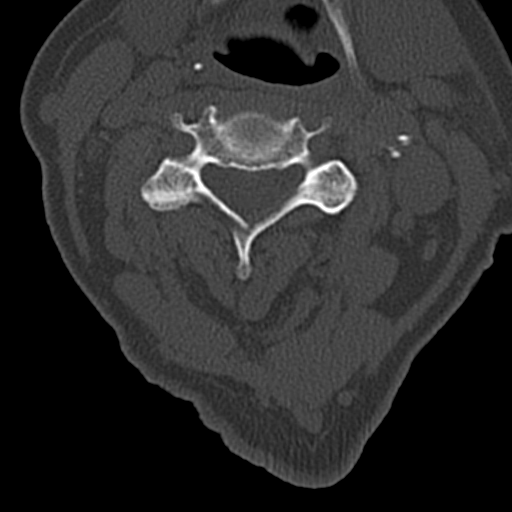

[Series 7: coronal bone · coronal · 0.23mm/px · 3 of 61 slices shown]
[im 13/61  bone]
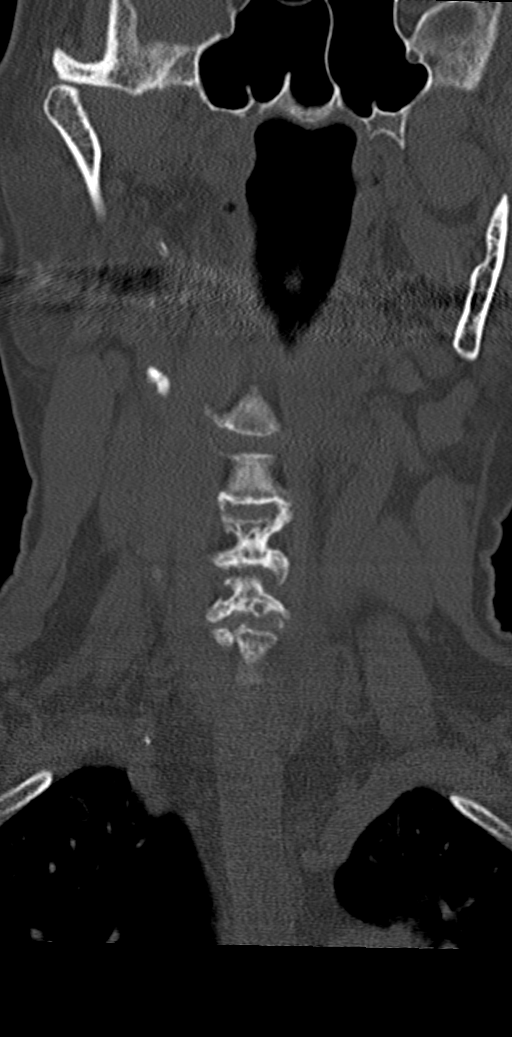
[im 25/61  bone]
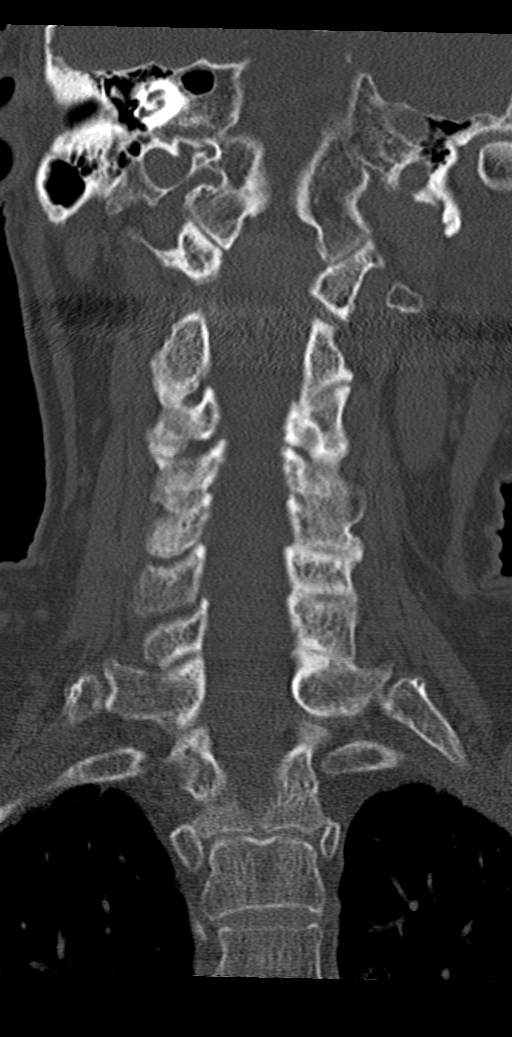
[im 37/61  bone]
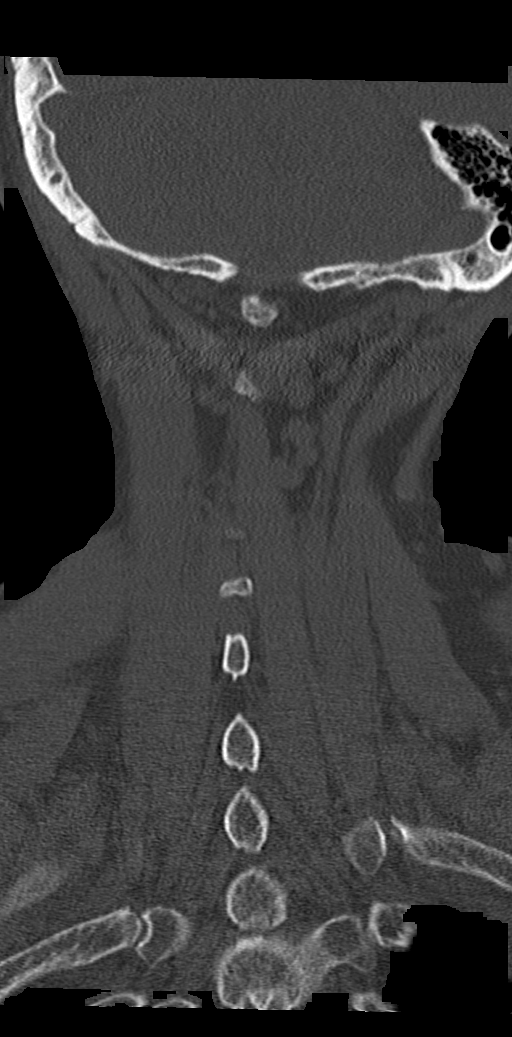

[Series 8: sagittal bone · sagittal · 0.23mm/px · 5 of 61 slices shown]
[im 21/61  bone]
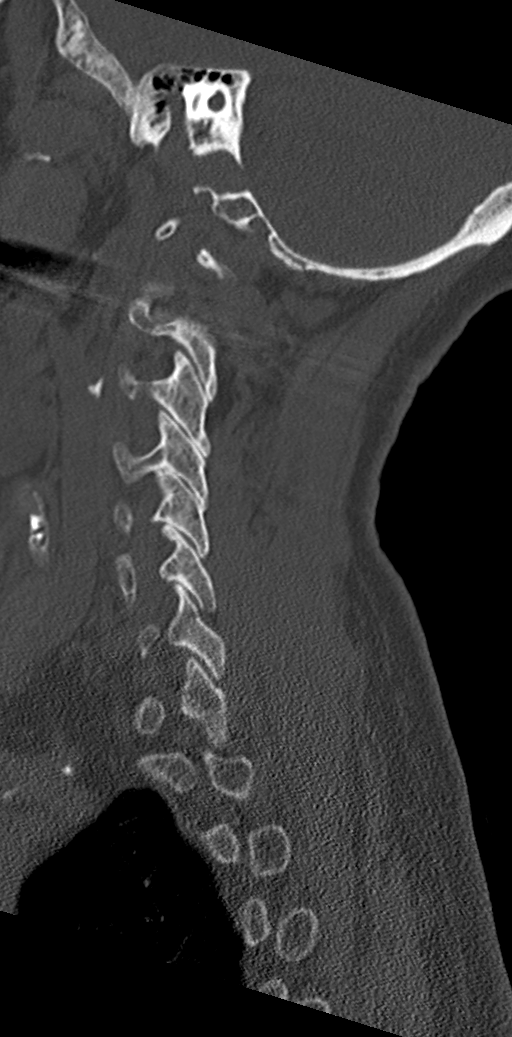
[im 26/61  bone]
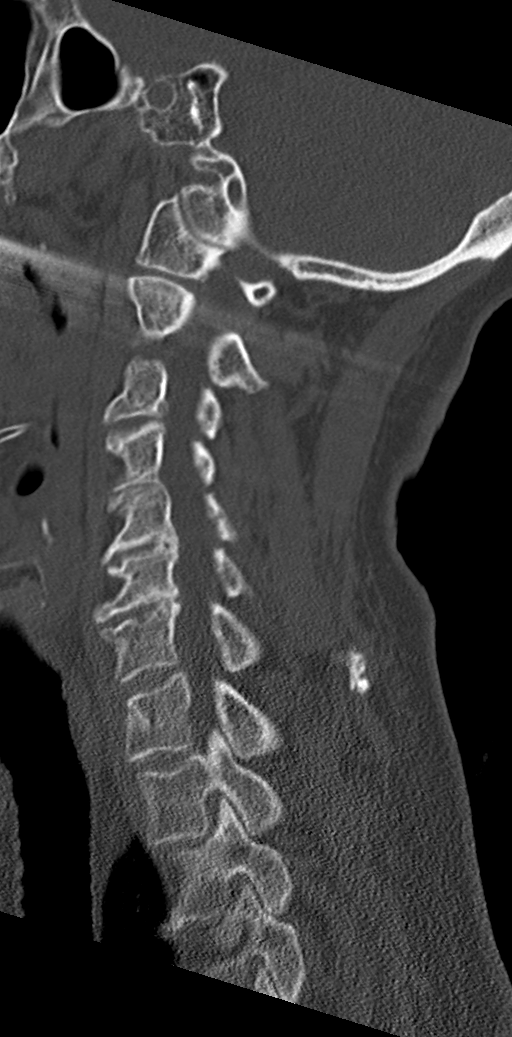
[im 31/61  bone]
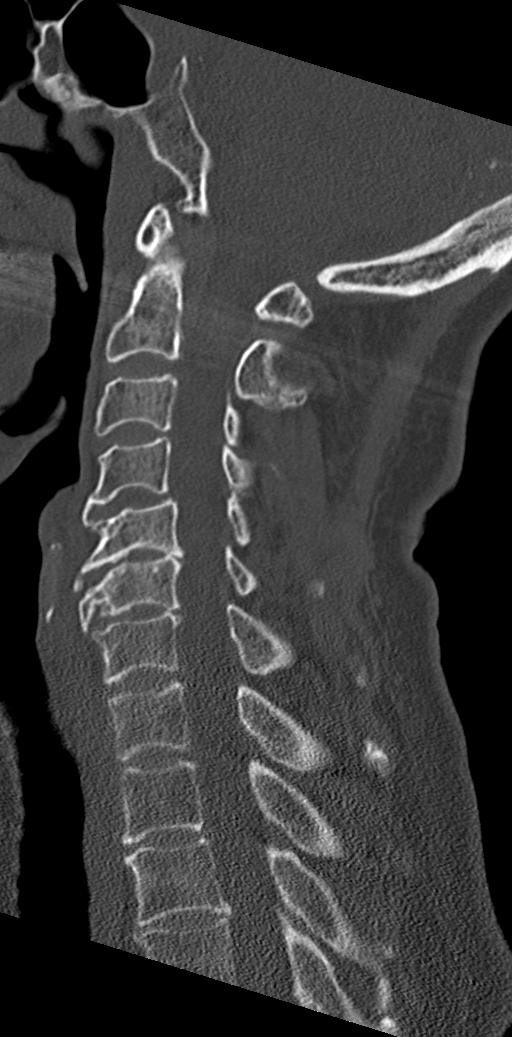
[im 36/61  bone]
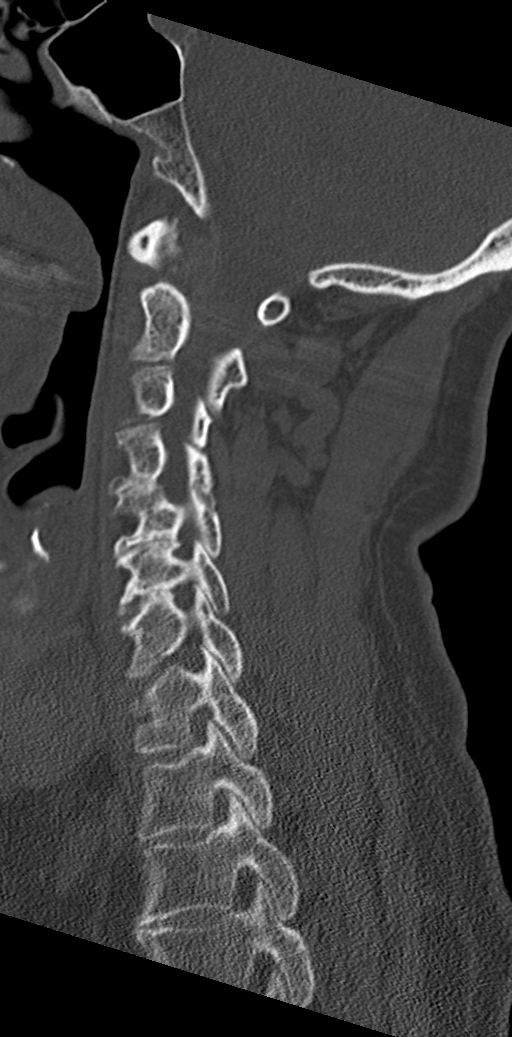
[im 41/61  bone]
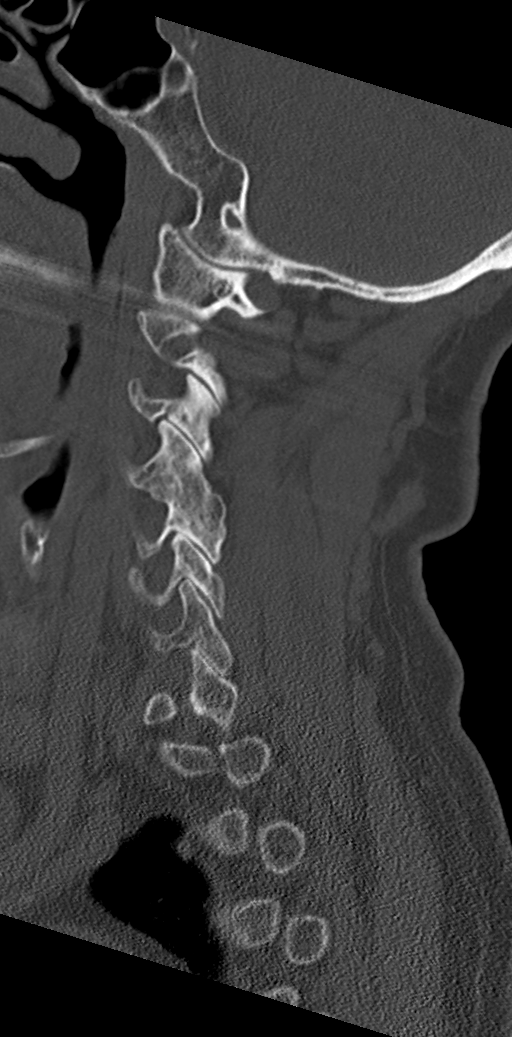

[9 of 33 positions shown; findings below may reference images not displayed]

FINDINGS: CT HEAD FINDINGS

Brain: Mild age related cortical atrophy, unchanged. Ventricular
system normal in size and appearance for age. Minimal changes of
small vessel disease of the white matter, unchanged. No mass lesion.
No midline shift. No acute hemorrhage or hematoma. No extra-axial
fluid collections. No evidence of acute infarction. Partial empty
sella again noted.

Vascular: Mild BILATERAL carotid siphon and vertebral artery
atherosclerosis. No hyperdense vessel.

Skull: No skull fracture or other focal osseous abnormality
involving the skull.

Sinuses/Orbits: Visualized paranasal sinuses, bilateral mastoid air
cells and bilateral middle ear cavities well-aerated. Visualized
orbits and globes normal in appearance.

Other: Very small RIGHT frontal scalp hematoma.

CT CERVICAL SPINE FINDINGS

Alignment: Anatomic posterior alignment. Straightening of the usual
lordosis. Slight neck tilt to the LEFT versus torticollis. Facet
joints anatomically aligned throughout with mild degenerative
changes.

Skull base and vertebrae: No fractures identified involving the
cervical spine. Coronal reformatted images demonstrate an intact
craniocervical junction, intact dens and intact lateral masses
throughout.

Soft tissues and spinal canal: No evidence of paraspinous or spinal
canal hematoma. No evidence of spinal stenosis.

Disc levels: Moderate disc space narrowing and endplate hypertrophic
changes at C5-6 and C6-7. Facet and uncinate hypertrophy account for
multilevel foraminal stenoses including moderate BILATERAL C3-4,
moderate BILATERAL C4-5, severe BILATERAL C5-6, and severe RIGHT and
moderate LEFT C6-7.

Upper chest: Visualized lung apices clear. Mild atherosclerosis
involving the proximal great vessels.

Other: Enlargement of the LEFT lobe of the thyroid gland containing
2 dominant nodules, the more ANTERIOR of which measures
approximately 1.2 x 1.8 cm and the more POSTERIOR of which measures
approximately 1.6 x 1.8 cm. BILATERAL cervical carotid
atherosclerosis.
IMPRESSION: 1. No acute intracranial abnormality.
2. Very small RIGHT frontal scalp hematoma without underlying skull
fracture.
3. Stable mild age related cortical atrophy and minimal chronic
microvascular ischemic changes of the white matter.
4. No cervical spine fractures identified.
5. Multilevel degenerative disc disease, spondylosis and facet
degenerative changes with multilevel foraminal stenoses as detailed
above.
6. Enlargement of the LEFT lobe of the thyroid gland with 2 dominant
nodules, measured above. Consider further evaluation with
non-emergent thyroid ultrasound. If patient is clinically
hyperthyroid, consider nuclear medicine thyroid uptake and scan.

## 2020-10-18 ENCOUNTER — Ambulatory Visit
Admission: RE | Admit: 2020-10-18 | Discharge: 2020-10-18 | Disposition: A | Discharge status: Home / Self Care | Payer: Medicare Other | Source: Ambulatory Visit | Attending: Family Medicine | Admitting: Family Medicine

## 2020-10-18 ENCOUNTER — Other Ambulatory Visit: Payer: Self-pay

## 2020-10-18 DIAGNOSIS — Z1231 Encounter for screening mammogram for malignant neoplasm of breast: Secondary | ICD-10-CM

## 2020-10-28 ENCOUNTER — Other Ambulatory Visit: Payer: Self-pay

## 2020-10-28 ENCOUNTER — Ambulatory Visit (INDEPENDENT_AMBULATORY_CARE_PROVIDER_SITE_OTHER): Payer: Medicare Other | Admitting: Podiatry

## 2020-10-28 ENCOUNTER — Encounter: Payer: Self-pay | Admitting: Podiatry

## 2020-10-28 DIAGNOSIS — M79672 Pain in left foot: Secondary | ICD-10-CM | POA: Diagnosis not present

## 2020-10-28 DIAGNOSIS — M79671 Pain in right foot: Secondary | ICD-10-CM | POA: Diagnosis not present

## 2020-10-28 DIAGNOSIS — L84 Corns and callosities: Secondary | ICD-10-CM | POA: Diagnosis not present

## 2020-11-05 NOTE — Progress Notes (Signed)
Subjective: [name] is a pleasant 80 y.o. female patient seen today painful callus(es) b/l feet. Aggravating factors include weightbearing with and without shoe gear. Pain is relieved with periodic professional debridement.   PCP is Dr. Collie Siad. Last visit was 01/05/2020.  Allergies  Allergen Reactions  . Atorvastatin     REACTION: myalgia  . Penicillin G     Other reaction(s): swelling  . Penicillins Hives and Itching    Has patient had a PCN reaction causing immediate rash, facial/tongue/throat swelling, SOB or lightheadedness with hypotension: no Has patient had a PCN reaction causing severe rash involving mucus membranes or skin necrosis: no Has patient had a PCN reaction that required hospitalization : no Has patient had a PCN reaction occurring within the last 10 years: no If all of the above answers are "NO", then may proceed with Cephalosporin use.    Objective: Physical Exam  General: [name] is a pleasant 80 y.o. African American female, WD, WN in NAD. AAO x 3.   Vascular:  Neurovascular status unchanged b/l lower extremities. Capillary refill time to digits immediate b/l. Palpable pedal pulses b/l LE. Pedal hair present. Lower extremity skin temperature gradient within normal limits. No pain with calf compression b/l. No edema noted b/l lower extremities. Varicosities present b/l. Evidence of chronic venous insufficiency b/l lower extremities.  Dermatological:  Pedal skin with normal turgor, texture and tone bilaterally. No open wounds bilaterally. No interdigital macerations bilaterally. Toenails 1-5 b/l well maintained with adequate length. No erythema, no edema, no drainage, no flocculence. Hyperkeratotic lesion(s) submet head 2 left foot, submet head 3 left foot, submet head 4 left foot and submet head 5 right foot.  No erythema, no edema, no drainage, no flocculence.  Musculoskeletal:  Normal muscle strength 5/5 to all lower extremity  muscle groups bilaterally. No pain crepitus or joint limitation noted with ROM b/l. No gross bony deformities bilaterally. Patient ambulates independent of any assistive aids.  Neurological:  Protective sensation intact 5/5 intact bilaterally with 10g monofilament b/l. Vibratory sensation intact b/l. Proprioception intact bilaterally.  Assessment and Plan:  1. Callus   2. Pain in both feet     -Examined patient. -No new findings. No new orders. -Callus(es) submet head 2 left foot, submet head 3 left foot, submet head 4 left foot and submet head 5 right foot pared utilizing sterile scalpel blade without complication or incident. Total number debrided =4. -Patient to report any pedal injuries to medical professional immediately. -Continue compression stockings daily for varicose veins. -Patient/POA to call should there be question/concern in the interim.  Return in about 3 months (around 01/27/2021).  Freddie Breech, DPM

## 2021-02-10 ENCOUNTER — Encounter: Payer: Self-pay | Admitting: Podiatry

## 2021-02-10 ENCOUNTER — Other Ambulatory Visit: Payer: Self-pay

## 2021-02-10 ENCOUNTER — Ambulatory Visit: Payer: Medicare Other | Admitting: Podiatry

## 2021-02-10 DIAGNOSIS — M79672 Pain in left foot: Secondary | ICD-10-CM | POA: Diagnosis not present

## 2021-02-10 DIAGNOSIS — L84 Corns and callosities: Secondary | ICD-10-CM

## 2021-02-10 DIAGNOSIS — M79671 Pain in right foot: Secondary | ICD-10-CM | POA: Diagnosis not present

## 2021-02-10 NOTE — Patient Instructions (Signed)
[employer] Oomg Eezee Low Shoes with stretchable uppers and memory foam insoles can be purchased online at: [email or web address]. They can also be purchased at [employer] shoe store ([employer]), [employer] or [employer].  Also available in clogs, houseslippers, slide styles. 

## 2021-02-12 NOTE — Progress Notes (Signed)
Subjective: [name] is a pleasant 80 y.o. female patient seen today for painful plantar calluses of both feet. Pain interferes with ambulation. Aggravating factors include weightbearing with and without shoegear. Pain is relieved with periodic professional debridement.  She states her daughter purchased her a new pair of shoes, which one shoe was eaten by a dog. She purchased a new pair and states they feel really good.   She has new concern of broken, enlarged veins of her thighs and legs. She does have support hose. States she cuts the toes out of her stockings for comfort.  PCP is Doristine Bosworth, MD. Last visit was: 01/05/2020.  Allergies  Allergen Reactions   Atorvastatin     REACTION: myalgia   Penicillin G     Other reaction(s): swelling   Penicillins Hives and Itching    Has patient had a PCN reaction causing immediate rash, facial/tongue/throat swelling, SOB or lightheadedness with hypotension: no Has patient had a PCN reaction causing severe rash involving mucus membranes or skin necrosis: no Has patient had a PCN reaction that required hospitalization : no Has patient had a PCN reaction occurring within the last 10 years: no If all of the above answers are "NO", then may proceed with Cephalosporin use.     Objective: Physical Exam  General: [name] is a pleasant 80 y.o. African American female, WD, WN in NAD. AAO x 3.   Vascular:  Capillary refill time to digits immediate b/l. Palpable DP pulse(s) b/l lower extremities Palpable PT pulse(s) b/l lower extremities Pedal hair present. Lower extremity skin temperature gradient within normal limits. No pain with calf compression b/l. Varicosities present b/l. Evidence of chronic venous insufficiency b/l lower extremities.  Dermatological:  Pedal skin with normal turgor, texture and tone b/l lower extremities. No open wounds b/l lower extremities. No interdigital macerations b/l lower extremities.  Toenails b/l lower extremities well maintained with adequate length. No erythema, no edema, no drainage, no fluctuance. Hyperkeratotic lesion(s) submet head 2 left foot, submet head 3 left foot, submet head 4 left foot, and submet head 5 right foot.  No erythema, no edema, no drainage, no fluctuance.  Musculoskeletal:  Normal muscle strength 5/5 to all lower extremity muscle groups bilaterally. No pain crepitus or joint limitation noted with ROM b/l. No gross bony deformities bilaterally.  Neurological:  Protective sensation intact 5/5 intact bilaterally with 10g monofilament b/l. Vibratory sensation intact b/l. Proprioception intact bilaterally.  Assessment and Plan:  1. Callus   2. Pain in both feet      -Examined patient. -Discussed varicose veins and venous insufficiency. She has compression hose for this. If she would like further workup, she was advised to see a Vascular specialist. She related understanding. -Recommended Oofos Oomg Eezee Low Shoes with stretchable uppers and memory foam insoles. -Patient to continue soft, supportive shoe gear daily. -Callus(es) submet head 2 left foot, submet head 3 left foot, submet head 4 left foot, and submet head 5 right foot pared utilizing sterile scalpel blade without complication or incident. Total number debrided =4. -Patient to report any pedal injuries to medical professional immediately. -Patient/POA to call should there be question/concern in the interim.  Return in about 3 months (around 05/13/2021).  Freddie Breech, DPM

## 2021-03-24 ENCOUNTER — Emergency Department (HOSPITAL_COMMUNITY)
Admission: EM | Admit: 2021-03-24 | Discharge: 2021-03-24 | Disposition: A | Discharge status: Home / Self Care | Payer: Medicare Other | Attending: Emergency Medicine | Admitting: Emergency Medicine

## 2021-03-24 ENCOUNTER — Ambulatory Visit (HOSPITAL_COMMUNITY)
Admission: EM | Admit: 2021-03-24 | Discharge: 2021-03-24 | Disposition: A | Discharge status: Home / Self Care | Payer: Medicare Other | Attending: Internal Medicine | Admitting: Internal Medicine

## 2021-03-24 ENCOUNTER — Encounter (HOSPITAL_COMMUNITY): Payer: Self-pay

## 2021-03-24 ENCOUNTER — Emergency Department (EMERGENCY_DEPARTMENT_HOSPITAL): Payer: Medicare Other

## 2021-03-24 ENCOUNTER — Other Ambulatory Visit: Payer: Self-pay

## 2021-03-24 ENCOUNTER — Emergency Department (HOSPITAL_COMMUNITY): Payer: Medicare Other

## 2021-03-24 DIAGNOSIS — Z7902 Long term (current) use of antithrombotics/antiplatelets: Secondary | ICD-10-CM | POA: Insufficient documentation

## 2021-03-24 DIAGNOSIS — R0789 Other chest pain: Secondary | ICD-10-CM | POA: Diagnosis not present

## 2021-03-24 DIAGNOSIS — R609 Edema, unspecified: Secondary | ICD-10-CM

## 2021-03-24 DIAGNOSIS — I2510 Atherosclerotic heart disease of native coronary artery without angina pectoris: Secondary | ICD-10-CM | POA: Insufficient documentation

## 2021-03-24 DIAGNOSIS — M7989 Other specified soft tissue disorders: Secondary | ICD-10-CM | POA: Diagnosis not present

## 2021-03-24 DIAGNOSIS — Z79899 Other long term (current) drug therapy: Secondary | ICD-10-CM | POA: Diagnosis not present

## 2021-03-24 DIAGNOSIS — Z7982 Long term (current) use of aspirin: Secondary | ICD-10-CM | POA: Insufficient documentation

## 2021-03-24 DIAGNOSIS — R519 Headache, unspecified: Secondary | ICD-10-CM | POA: Insufficient documentation

## 2021-03-24 DIAGNOSIS — R079 Chest pain, unspecified: Secondary | ICD-10-CM | POA: Diagnosis present

## 2021-03-24 LAB — CBC WITH DIFFERENTIAL/PLATELET
Abs Immature Granulocytes: 0.01 10*3/uL (ref 0.00–0.07)
Basophils Absolute: 0 10*3/uL (ref 0.0–0.1)
Basophils Relative: 0 %
Eosinophils Absolute: 0.1 10*3/uL (ref 0.0–0.5)
Eosinophils Relative: 2 %
HCT: 46.8 % — ABNORMAL HIGH (ref 36.0–46.0)
Hemoglobin: 15.3 g/dL — ABNORMAL HIGH (ref 12.0–15.0)
Immature Granulocytes: 0 %
Lymphocytes Relative: 43 %
Lymphs Abs: 2.1 10*3/uL (ref 0.7–4.0)
MCH: 31.4 pg (ref 26.0–34.0)
MCHC: 32.7 g/dL (ref 30.0–36.0)
MCV: 96.1 fL (ref 80.0–100.0)
Monocytes Absolute: 0.4 10*3/uL (ref 0.1–1.0)
Monocytes Relative: 8 %
Neutro Abs: 2.3 10*3/uL (ref 1.7–7.7)
Neutrophils Relative %: 47 %
Platelets: 147 10*3/uL — ABNORMAL LOW (ref 150–400)
RBC: 4.87 MIL/uL (ref 3.87–5.11)
RDW: 14.4 % (ref 11.5–15.5)
WBC: 5 10*3/uL (ref 4.0–10.5)
nRBC: 0 % (ref 0.0–0.2)

## 2021-03-24 LAB — COMPREHENSIVE METABOLIC PANEL
ALT: 25 U/L (ref 0–44)
AST: 34 U/L (ref 15–41)
Albumin: 4.1 g/dL (ref 3.5–5.0)
Alkaline Phosphatase: 33 U/L — ABNORMAL LOW (ref 38–126)
Anion gap: 9 (ref 5–15)
BUN: 10 mg/dL (ref 8–23)
CO2: 27 mmol/L (ref 22–32)
Calcium: 9.7 mg/dL (ref 8.9–10.3)
Chloride: 104 mmol/L (ref 98–111)
Creatinine, Ser: 0.93 mg/dL (ref 0.44–1.00)
GFR, Estimated: >60 mL/min (ref >60)
Glucose, Bld: 87 mg/dL (ref 70–99)
Potassium: 3.7 mmol/L (ref 3.5–5.1)
Sodium: 140 mmol/L (ref 135–145)
Total Bilirubin: 1.1 mg/dL (ref 0.3–1.2)
Total Protein: 6.8 g/dL (ref 6.5–8.1)

## 2021-03-24 LAB — URINALYSIS, ROUTINE W REFLEX MICROSCOPIC
Bilirubin Urine: NEGATIVE
Glucose, UA: NEGATIVE mg/dL
Hgb urine dipstick: NEGATIVE
Ketones, ur: NEGATIVE mg/dL
Leukocytes,Ua: NEGATIVE
Nitrite: NEGATIVE
Protein, ur: NEGATIVE mg/dL
Specific Gravity, Urine: 1.002 — ABNORMAL LOW (ref 1.005–1.030)
pH: 7 (ref 5.0–8.0)

## 2021-03-24 LAB — TROPONIN I (HIGH SENSITIVITY): Troponin I (High Sensitivity): 4 ng/L (ref <18)

## 2021-03-24 NOTE — ED Provider Notes (Signed)
[facility] EMERGENCY DEPARTMENT Provider Note   CSN: [id number] Arrival date & time: 03/24/21  1431     History Chief Complaint  Patient presents with   Chest Pain   Headache    [name] is a 80 y.o. female.   Chest Pain Associated symptoms: headache   Associated symptoms: no abdominal pain, no back pain, no cough, no fever, no palpitations, no shortness of breath and no vomiting   Headache Associated symptoms: no abdominal pain, no back pain, no cough, no ear pain, no eye pain, no fever, no seizures, no sore throat and no vomiting    80 year old female with a past medical history of anxiety, CAD, depression, hyperlipidemia presenting to the emergency department with left-sided chest pain, and left-sided head pain.  Patient reports that for the past 3 to 4 days, she has experienced a sharp pain on the top left of her head.  She describes it as sharp, nonradiating.  It is intermittent.  It last several seconds when it occurs and then resolve spontaneously.  She denies its description is a headache.  There is no associated nausea, vomiting, diarrhea.  No recent fall or head trauma.  She states that she did some heavy gardening 3 days ago and then afterwards began experiencing some left sided chest wall pain.  It is sharp, will also last several seconds.  It is exacerbated by certain movements of her arm.  It is not exertional or pleuritic in nature.  She denies any associated shortness of breath.  When asked about leg swelling, the patient describes some large veins that she sees in her leg if she stands for long period of time, but otherwise denies any leg swelling.  She has no orthopnea or dyspnea on exertion.  Past Medical History:  Diagnosis Date   Acute sinus infection 01/15/2011   ANXIETY 10/03/2009   BARRETTS ESOPHAGUS 10/03/2009   CAROTID ARTERY DISEASE 08/31/2009   COLONIC POLYPS, HX OF 08/29/2009   CONSTIPATION, CHRONIC 10/03/2009   DEPRESSION 10/03/2009    DISC DISEASE, CERVICAL 08/29/2009   DISC DISEASE, LUMBAR 08/29/2009   Eustachian tube dysfunction 01/15/2011   GERD 08/29/2009   HYPERLIPIDEMIA 08/29/2009   Impaired glucose tolerance 01/14/2011   LEG PAIN, BILATERAL 08/29/2009   LOW BACK PAIN, CHRONIC 11/22/2009   PERIPHERAL NEUROPATHY 10/03/2009   PERIPHERAL VASCULAR DISEASE 08/29/2009   POSITIVE PPD 10/03/2009   Preventative health care 01/14/2011   Stroke (HCC)    Thyroid nodule 08/09/2011   TIA 11/22/2009   TRANSIENT ISCHEMIC ATTACK, HX OF 08/29/2009   URETHRAL STRICTURE 10/03/2009   VOCAL CORD POLYP, HX OF 10/03/2009   WRIST PAIN, BILATERAL 05/25/2010    Patient Active Problem List   Diagnosis Date Noted   Dizziness 08/14/2017   Chronic swimmer's ear of both sides 12/13/2016   Acute left hemiparesis (HCC)    TIA (transient ischemic attack) 06/01/2016   CVA (cerebral vascular accident) (HCC) 06/01/2016   Transient cerebral ischemia    Pelvic prolapse 11/03/2014   Cystocele 09/29/2014   SUI (stress urinary incontinence, female) 09/29/2014   Complicated migraine 08/22/2014   Multinodular goiter 08/09/2011   Lumbar pain with radiation down right leg 04/11/2011   Rash 04/11/2011   Impaired glucose tolerance 01/14/2011   Preventative health care 01/14/2011   WRIST PAIN, BILATERAL 05/25/2010   TIA 11/22/2009   LOW BACK PAIN, CHRONIC 11/22/2009   ANXIETY 10/03/2009   DEPRESSION 10/03/2009   PERIPHERAL NEUROPATHY 10/03/2009   BARRETTS ESOPHAGUS 10/03/2009  CONSTIPATION, CHRONIC 10/03/2009   URETHRAL STRICTURE 10/03/2009   POSITIVE PPD 10/03/2009   VOCAL CORD POLYP, HX OF 10/03/2009   Carotid artery disease (HCC) 08/31/2009   Hyperlipemia 08/29/2009   PERIPHERAL VASCULAR DISEASE 08/29/2009   GERD 08/29/2009   DISC DISEASE, CERVICAL 08/29/2009   DISC DISEASE, LUMBAR 08/29/2009   LEG PAIN, BILATERAL 08/29/2009   TRANSIENT ISCHEMIC ATTACK, HX OF 08/29/2009   COLONIC POLYPS, HX OF 08/29/2009    Past Surgical History:  Procedure  Laterality Date   hx of right ankle fracture     s/p vocal cord polyps  1984   chronic hoarseness   stress test negative  2003,2004,2006   TUBAL LIGATION     tubaligation     uterine prolapse       OB History   No obstetric history on file.     Family History  Problem Relation Age of Onset   Stroke Mother    Heart disease Mother    Hypertension Mother    Diabetes Mother    Multiple myeloma Sister    Cancer Other        lung    Social History   Tobacco Use   Smoking status: Never   Smokeless tobacco: Never  Vaping Use   Vaping Use: Never used  Substance Use Topics   Alcohol use: No   Drug use: No    Home Medications Prior to Admission medications   Medication Sig Start Date End Date Taking? Authorizing Provider  acetaminophen (TYLENOL) 325 MG tablet Take 650 mg by mouth every 6 (six) hours as needed for mild pain or headache.    [provider]  alendronate (FOSAMAX) 70 MG tablet PLEASE SEE ATTACHED FOR DETAILED DIRECTIONS 09/30/20   [provider]  aspirin 325 MG tablet     [provider]  ciprofloxacin (CIPRO) 250 MG tablet  05/20/20   [provider]  clopidogrel (PLAVIX) 75 MG tablet     [provider]  Coenzyme Q10 (CO Q 10 PO)     [provider]  Coenzyme Q10 10 MG capsule Take 200 mg by mouth daily.    [provider]  cyclobenzaprine (FLEXERIL) 5 MG tablet Take 5 mg by mouth 2 (two) times daily. 05/25/20   [provider]  famotidine (PEPCID) 20 MG tablet Take 40 mg by mouth as needed.  01/01/20   [provider]  loratadine (CLARITIN) 10 MG tablet     [provider]  magnesium hydroxide (MILK OF MAGNESIA) 400 MG/5ML suspension     [provider]  Multiple Vitamins-Minerals (MULTIVITAMIN WITH MINERALS) tablet Take 1 tablet by mouth daily.    [provider]  naproxen (NAPROSYN) 375 MG tablet Take 1 tablet (375 mg total) by mouth as needed for mild  pain. 06/03/20   Cantwell, Celeste C, PA-C  Omega-3 Fatty Acids (FISH OIL PO)     [provider]  Omega-3 Fatty Acids (FISH OIL) 1000 MG CAPS Take 1,000 mg by mouth daily.     [provider]  pravastatin (PRAVACHOL) 40 MG tablet     [provider]  rosuvastatin (CRESTOR) 40 MG tablet Take 1 tablet (40 mg total) by mouth daily. 12/30/19   Noni Saupe, MD  SODIUM FLUORIDE 5000 SENSITIVE 1.1-5 % GEL Take by mouth at bedtime. 10/28/20   [provider]  tiZANidine (ZANAFLEX) 4 MG tablet Take 4 mg by mouth 3 (three) times daily. 05/20/20   [provider]  triamcinolone (KENALOG) 0.1 % paste  09/28/20   [provider]    Allergies    Atorvastatin, Penicillin g, and Penicillins  Review of Systems   Review of Systems  Constitutional:  Negative for chills and fever.  HENT:  Negative for ear pain and sore throat.   Eyes:  Negative for pain and visual disturbance.  Respiratory:  Negative for cough and shortness of breath.   Cardiovascular:  Positive for chest pain and leg swelling. Negative for palpitations.  Gastrointestinal:  Negative for abdominal pain and vomiting.  Genitourinary:  Negative for dysuria and hematuria.  Musculoskeletal:  Negative for arthralgias and back pain.  Skin:  Negative for color change and rash.  Neurological:  Positive for headaches. Negative for seizures and syncope.  All other systems reviewed and are negative.  Physical Exam Updated Vital Signs BP (!) 180/92 (BP Location: Left Arm)   Pulse 60   Temp 97.8 F (36.6 C) (Oral)   Resp 16   Ht 5\' 6"  (1.676 m)   Wt 64.9 kg   SpO2 100%   BMI 23.08 kg/m   Physical Exam Vitals and nursing note reviewed.  Constitutional:      General: She is not in acute distress.    Appearance: She is well-developed and normal weight. She is not ill-appearing or toxic-appearing.  HENT:     Head: Normocephalic and atraumatic.  Eyes:     Conjunctiva/sclera:  Conjunctivae normal.  Cardiovascular:     Rate and Rhythm: Normal rate and regular rhythm.     Heart sounds: No murmur heard. Pulmonary:     Effort: Pulmonary effort is normal. No respiratory distress.     Breath sounds: Normal breath sounds.  Abdominal:     Palpations: Abdomen is soft.     Tenderness: There is no abdominal tenderness.  Musculoskeletal:     Cervical back: Neck supple.     Right lower leg: No edema.     Left lower leg: No edema.  Skin:    General: Skin is warm and dry.     Capillary Refill: Capillary refill takes less than 2 seconds.  Neurological:     Mental Status: She is alert and oriented to person, place, and time.     Cranial Nerves: No cranial nerve deficit.     Motor: No weakness.    ED Results / Procedures / Treatments   Labs (all labs ordered are listed, but only abnormal results are displayed) Labs Reviewed  COMPREHENSIVE METABOLIC PANEL - Abnormal; Notable for the following components:      Result Value   Alkaline Phosphatase 33 (*)    All other components within normal limits  CBC WITH DIFFERENTIAL/PLATELET - Abnormal; Notable for the following components:   Hemoglobin 15.3 (*)    HCT 46.8 (*)    Platelets 147 (*)    All other components within normal limits  URINALYSIS, ROUTINE W REFLEX MICROSCOPIC - Abnormal; Notable for the following components:   Color, Urine COLORLESS (*)    Specific Gravity, Urine 1.002 (*)    All other components within normal limits  TROPONIN I (HIGH SENSITIVITY)    EKG EKG Interpretation  Date/Time:  Friday March 24 2021 14:37:41 EDT Ventricular Rate:  67 PR Interval:  176 QRS Duration: 76 QT Interval:  398 QTC Calculation: 420 R Axis:   9 Text Interpretation: Normal sinus rhythm Cannot rule out Anterior infarct , age undetermined Abnormal ECG No significant change since last tracing Confirmed by Particia Nearing,  Raynelle Fanning [phone]) on 03/24/2021 6:12:32 PM  Radiology DG Chest 2 View  Result Date: 03/24/2021 CLINICAL  DATA:  Chest pain EXAM: CHEST - 2 VIEW COMPARISON:  Chest radiograph 03/03/2020 FINDINGS: The cardiomediastinal silhouette is normal. The lungs are clear, with no focal consolidation or pulmonary edema. There is no pleural effusion or pneumothorax. There is multilevel degenerative change of the thoracic spine. There is no acute osseous abnormality. IMPRESSION: No radiographic evidence of acute cardiopulmonary process. Electronically Signed   By: Lesia Hausen M.D.   On: 03/24/2021 16:01   VAS [place] LOWER EXTREMITY VENOUS (DVT) (ONLY [facility] & [facility] 7a-7p)  Result Date: 03/24/2021  Lower Venous DVT Study Patient Name:  [name]  Date of Exam:   03/24/2021 Medical Rec #: [id number]             Accession #:    [id number] Date of Birth: 10/28/1940            Patient Gender: F Patient Age:   61 years Exam Location:  [facility] Procedure:      VAS [place] LOWER EXTREMITY VENOUS (DVT) Referring Phys: Lanora Manis HAMMOND --------------------------------------------------------------------------------  Indications: Edema.  Limitations: Poor ultrasound/tissue interface. Comparison Study: No prior study Performing Technologist: Gertie Fey MHA, RDMS, RVT, RDCS  Examination Guidelines: A complete evaluation includes B-mode imaging, spectral Doppler, color Doppler, and power Doppler as needed of all accessible portions of each vessel. Bilateral testing is considered an integral part of a complete examination. Limited examinations for reoccurring indications may be performed as noted. The reflux portion of the exam is performed with the patient in reverse Trendelenburg.  +---------+---------------+---------+-----------+----------+--------------+ RIGHT    CompressibilityPhasicitySpontaneityPropertiesThrombus Aging +---------+---------------+---------+-----------+----------+--------------+ CFV      Full           Yes      Yes                                  +---------+---------------+---------+-----------+----------+--------------+ SFJ      Full                                                        +---------+---------------+---------+-----------+----------+--------------+ FV Prox  Full                                                        +---------+---------------+---------+-----------+----------+--------------+ FV Mid   Full                                                        +---------+---------------+---------+-----------+----------+--------------+ FV DistalFull                                                        +---------+---------------+---------+-----------+----------+--------------+ PFV      Full                                                        +---------+---------------+---------+-----------+----------+--------------+  POP      Full           Yes      Yes                                 +---------+---------------+---------+-----------+----------+--------------+ PTV      Full                                                        +---------+---------------+---------+-----------+----------+--------------+ PERO     Full                                                        +---------+---------------+---------+-----------+----------+--------------+   +----+---------------+---------+-----------+----------+--------------+ LEFTCompressibilityPhasicitySpontaneityPropertiesThrombus Aging +----+---------------+---------+-----------+----------+--------------+ CFV Full           Yes      Yes                                 +----+---------------+---------+-----------+----------+--------------+     Summary: RIGHT: - There is no evidence of deep vein thrombosis in the lower extremity.  - No cystic structure found in the popliteal fossa.  LEFT: - No evidence of common femoral vein obstruction.  *See table(s) above for measurements and observations. Electronically signed by Sherald Hess  MD on 03/24/2021 at 6:04:19 PM.    Final     Procedures Procedures   Medications Ordered in ED Medications - No data to display  ED Course  I have reviewed the triage vital signs and the nursing notes.  Pertinent labs & imaging results that were available during my care of the patient were reviewed by me and considered in my medical decision making (see chart for details).    MDM Rules/Calculators/A&P                           80 year old female with above past medical history presenting to the emergency department for multiple complaints.  Vital signs reviewed, she is slightly hypertensive here but otherwise within acceptable limits.  For her right lower extremity swelling that she has noted to the veins, it is positional and gravity related.  Ultrasound obtained in triage negative for DVT.  Presentation seems most consistent with varicose veins.  I recommended compression stockings.  For her chest pain, it is atypical.  Her EKG has no anatomic ST elevation or ST depression or new T wave inversions that would be concerning for acute ischemia.  Initial troponin is negative.  Given that her chest pain is not exertional in any way and she has no shortness of breath, I do not believe that she is experiencing ACS and I do not believe that she needs delta troponins.  She is not tachycardic, hypoxic, has no PE risk factors, I do not believe she is experiencing pulmonary embolism.  Chest x-ray obtained without any evidence of pneumonia or pneumothorax.  Presentation not consistent with aortic dissection.  For her head plain, it is sharp and intermittent.  It is not currently present.  It does not seem  like a primary or secondary headache disorder.  I do not believe that intracranial imaging is indicated.  Overall, she seems very well-appearing and is currently asymptomatic upon my evaluation of the patient.  Work-up here has been reassuring.  Given the atypical nature of her symptoms, believe that she is  stable for discharge home at this time with supportive care measures such as ibuprofen and Tylenol.  I explained to the patient I do not know the exact etiology of her symptoms, but I do not believe it is due to a medical or surgical emergency.  She is comfortable to plan for discharge home.  I provided strict return precautions and verbal and written format.  Final Clinical Impression(s) / ED Diagnoses Final diagnoses:  Atypical chest pain  Acute nonintractable headache, unspecified headache type    Rx / DC Orders ED Discharge Orders     None        Lenard Lance, MD 03/24/21 2100    Jacalyn Lefevre, MD 03/24/21 2106

## 2021-03-24 NOTE — ED Triage Notes (Addendum)
Pt states "I'm just not myself", pt having headaches and left chest pain (intermittent, states it is not sharp but something to be concerned about). Chest pain started 3 days ago. Patient denies pain radiating and vision changes. Pt is on Plavix, hx of TIA and Stroke.

## 2021-03-24 NOTE — ED Notes (Signed)
Report called to ED  

## 2021-03-24 NOTE — ED Notes (Signed)
EKG results given to provider, Richrd Prime  PA. Patient is being discharged from the Urgent Care and sent to the Emergency Department via personal vehicle. Per R. Lane PA, patient is in need of higher level of care due to extensive cardiac history, and symptoms. Patient is aware and verbalizes understanding of plan of care.  Vitals:   03/24/21 1409  BP: (!) 156/83  Pulse: 70  Resp: 18  Temp: 98.8 F (37.1 C)  SpO2: 100%

## 2021-03-24 NOTE — ED Provider Notes (Signed)
Emergency Medicine Provider Triage Evaluation Note  [name] , a 80 y.o. female  was evaluated in triage.  Pt complains of intermittent chest pain.  She states that she has been having chest pain on the left side that will last for 1 to 2 seconds before going away.  This has been Going on for 3 days.  She states that she has also noticed swelling in her right leg during this time.  She denies any fevers or cough.  No shortness of breath. She attempted to see her doctor but they were unable to see her until the 15th. She states that she came in today as she was having similar head pains.  They are on the left side, also only last 1 to 2 seconds.  She denies any weakness or numbness..  Review of Systems  Positive: Chest pain, headache, leg swelling Negative: Trauma, numbness, weakness, fevers  Physical Exam  BP (!) 175/83 (BP Location: Right Arm)   Pulse 66   Temp 98.7 F (37.1 C) (Oral)   Resp 18   Ht 5\' 6"  (1.676 m)   Wt 64.9 kg   SpO2 100%   BMI 23.08 kg/m  Gen:   Awake, no distress   Resp:  Normal effort  MSK:   Moves extremities without difficulty right leg is swollen when compared to left Other:  Patient is awake and alert.  Speech is nonslurred.  Moves all 4 extremities spontaneously.  Medical Decision Making  Medically screening exam initiated at 3:19 PM.  Appropriate orders placed.  [name] was informed that the remainder of the evaluation will be completed by another provider, this initial triage assessment does not replace that evaluation, and the importance of remaining in the ED until their evaluation is complete.  Note: Portions of this report may have been transcribed using voice recognition software. Every effort was made to ensure accuracy; however, inadvertent computerized transcription errors may be present    [name] 03/24/21 1521    03/26/21, MD 03/24/21 2116

## 2021-03-24 NOTE — ED Triage Notes (Signed)
Pt c/o intermittent CP and headache since this morning. Denies N/V, vision changes, radiation of pain.

## 2021-03-24 NOTE — Progress Notes (Signed)
Right lower extremity venous duplex completed. Refer to "CV Proc" under chart review to view preliminary results.  03/24/2021 4:14 PM Eula Fried., MHA, RVT, RDCS, RDMS

## 2021-04-13 ENCOUNTER — Ambulatory Visit: Payer: Medicare Other | Admitting: Cardiology

## 2021-04-13 ENCOUNTER — Encounter: Payer: Self-pay | Admitting: Cardiology

## 2021-04-13 ENCOUNTER — Other Ambulatory Visit: Payer: Self-pay

## 2021-04-13 VITALS — BP 134/74 | HR 67 | Temp 97.9°F | Resp 16 | Ht 66.0 in | Wt 147.0 lb

## 2021-04-13 DIAGNOSIS — I63311 Cerebral infarction due to thrombosis of right middle cerebral artery: Secondary | ICD-10-CM

## 2021-04-13 DIAGNOSIS — E7800 Pure hypercholesterolemia, unspecified: Secondary | ICD-10-CM

## 2021-04-13 DIAGNOSIS — I10 Essential (primary) hypertension: Secondary | ICD-10-CM

## 2021-04-13 NOTE — Progress Notes (Signed)
Primary Physician/Referring:  No primary care provider on file.  Patient ID: [name], female    DOB: Mar 13, 1941, 80 y.o.   MRN: [id number]  Chief Complaint  Patient presents with   Chest Pain   Hypertension   Hospitalization Follow-up   HPI:    HPI: [name]  is a 80 y.o. with h/o hyperlipidemia, stroke on 06/03/2016 with left hemiparesis. She has very mild residual deficits with speach and most of her leg weakness has now resolved.  She is followed in our office for hypertension, carotid artery stenosis, hyperlipidemia.  Patient presents for annual follow-up of hyperlipidemia, hypertension, bilateral carotid artery stenosis.  She is presently doing well, without symptoms.  Denies chest pain, dizziness, shortness of breath, swelling.  Since her husband passed away, she has remained fairly active and her joint remains And has been traveling some and also spending social time together and has overall noticed improvement in quality of life.  She has not had any recurrence of TIA-like symptoms.  Tolerating all medications well. Past Medical History:  Diagnosis Date   Acute sinus infection 01/15/2011   ANXIETY 10/03/2009   BARRETTS ESOPHAGUS 10/03/2009   CAROTID ARTERY DISEASE 08/31/2009   COLONIC POLYPS, HX OF 08/29/2009   CONSTIPATION, CHRONIC 10/03/2009   DEPRESSION 10/03/2009   DISC DISEASE, CERVICAL 08/29/2009   DISC DISEASE, LUMBAR 08/29/2009   Eustachian tube dysfunction 01/15/2011   GERD 08/29/2009   HYPERLIPIDEMIA 08/29/2009   Impaired glucose tolerance 01/14/2011   LEG PAIN, BILATERAL 08/29/2009   LOW BACK PAIN, CHRONIC 11/22/2009   PERIPHERAL NEUROPATHY 10/03/2009   PERIPHERAL VASCULAR DISEASE 08/29/2009   POSITIVE PPD 10/03/2009   Preventative health care 01/14/2011   Stroke (HCC)    Thyroid nodule 08/09/2011   TIA 11/22/2009   TRANSIENT ISCHEMIC ATTACK, HX OF 08/29/2009   URETHRAL STRICTURE 10/03/2009   VOCAL CORD POLYP, HX OF 10/03/2009   WRIST PAIN, BILATERAL  05/25/2010   Past Surgical History:  Procedure Laterality Date   hx of right ankle fracture     s/p vocal cord polyps  1984   chronic hoarseness   stress test negative  2003,2004,2006   TUBAL LIGATION     tubaligation     uterine prolapse     Social History   Tobacco Use   Smoking status: Never   Smokeless tobacco: Never  Substance Use Topics   Alcohol use: No   Marital Status: Widowed  ROS  Review of Systems  Cardiovascular:  Negative for chest pain, dyspnea on exertion and leg swelling.  Musculoskeletal:  Positive for arthritis. Negative for back pain.  Gastrointestinal:  Negative for melena.  Objective  Blood pressure 134/74, pulse 67, temperature 97.9 F (36.6 C), resp. rate 16, height 5\' 6"  (1.676 m), weight 147 lb (66.7 kg), SpO2 96 %. Body mass index is 23.73 kg/m.   Physical Exam Vitals reviewed.  Constitutional:      Comments: She is moderately breath and well-nourished in no acute distress.  HENT:     Head: Normocephalic and atraumatic.  Neck:     Vascular: No carotid bruit or JVD.  Cardiovascular:     Rate and Rhythm: Normal rate and regular rhythm.     Pulses: Intact distal pulses.          Popliteal pulses are 2+ on the right side and 2+ on the left side.       Dorsalis pedis pulses are 1+ on the right side and 1+ on the left side.  Posterior tibial pulses are 1+ on the right side and 1+ on the left side.     Heart sounds: Normal heart sounds, S1 normal and S2 normal. No murmur heard.   No gallop.  Pulmonary:     Effort: Pulmonary effort is normal. No accessory muscle usage or respiratory distress.     Breath sounds: Normal breath sounds. No wheezing, rhonchi or rales.  Abdominal:     General: Bowel sounds are normal.     Palpations: Abdomen is soft.  Musculoskeletal:        General: Normal range of motion.     Cervical back: Normal range of motion.     Right lower leg: No edema.     Left lower leg: No edema.     Comments: Mild varicose  veins right leg  Skin:    General: Skin is warm and dry.  Neurological:     Mental Status: She is alert and oriented to person, place, and time.   Laboratory examination:   CMP Latest Ref Rng & Units 03/24/2021 09/02/2019 01/21/2019  Glucose 70 - 99 mg/dL 87 85 96  BUN 8 - 23 mg/dL 10 6(L) 10  Creatinine 0.44 - 1.00 mg/dL 5.57 3.22 0.25(K)  Sodium 135 - 145 mmol/L 140 146(H) 145(H)  Potassium 3.5 - 5.1 mmol/L 3.7 3.7 4.2  Chloride 98 - 111 mmol/L 104 105 105  CO2 22 - 32 mmol/L 27 27 23   Calcium 8.9 - 10.3 mg/dL 9.7 9.9 9.8  Total Protein 6.5 - 8.1 g/dL 6.8 7.1 6.7  Total Bilirubin 0.3 - 1.2 mg/dL 1.1 0.6 0.6  Alkaline Phos 38 - 126 U/L 33(L) 77 74  AST 15 - 41 U/L 34 32 29  ALT 0 - 44 U/L 25 28 21    CBC Latest Ref Rng & Units 03/24/2021 01/21/2019 07/18/2017  WBC 4.0 - 10.5 K/uL 5.0 4.3 4.1(A)  Hemoglobin 12.0 - 15.0 g/dL 15.3(H) 15.5 15.0  Hematocrit 36.0 - 46.0 % 46.8(H) 45.7 46.0  Platelets 150 - 400 K/uL 147(L) 157 -   Lipid Panel     Component Value Date/Time   CHOL 160 11/04/2019 1149   TRIG 106 11/04/2019 1149   HDL 55 11/04/2019 1149   CHOLHDL 2.9 11/04/2019 1149   CHOLHDL 5.6 08/22/2014 0409   VLDL 17 08/22/2014 0409   LDLCALC 86 11/04/2019 1149   LDLDIRECT 194.5 05/25/2010 1139   HEMOGLOBIN A1C Lab Results  Component Value Date   HGBA1C 5.9 03/12/2018   MPG 123 (H) 08/22/2014   TSH No results for input(s): TSH in the last 8760 hours.  External Labs:  None   Medications   Current Outpatient Medications  Medication Instructions   acetaminophen (TYLENOL) 650 mg, Oral, Every 6 hours PRN   alendronate (FOSAMAX) 70 mg, Oral, Weekly   clopidogrel (PLAVIX) 75 MG tablet No dose, route, or frequency recorded.   Coenzyme Q10 (CO Q 10 PO) No dose, route, or frequency recorded.   Fish Oil 1,000 mg, Oral, Daily   loratadine (CLARITIN) 10 MG tablet No dose, route, or frequency recorded.   magnesium hydroxide (MILK OF MAGNESIA) 400 MG/5ML suspension No dose, route,  or frequency recorded.   Multiple Vitamins-Minerals (MULTIVITAMIN WITH MINERALS) tablet 1 tablet, Oral, Daily   Omega-3 Fatty Acids (FISH OIL PO) No dose, route, or frequency recorded.   rosuvastatin (CRESTOR) 40 mg, Oral, Daily   Radiology:   Right lower extremity venous duplex 03/24/2021: RIGHT:  - There is no evidence of deep vein thrombosis  in the lower extremity.  - No cystic structure found in the popliteal fossa.    LEFT:  - No evidence of common femoral vein obstruction.    Cardiac Studies:   Carotid artery duplex 22-Jun-2020:  Stenosis in the right internal carotid artery (1-15%).  Stenosis in the left internal carotid artery (1-15%). Stenosis in the left external carotid artery (<50%).  Antegrade right vertebral artery flow. Antegrade left vertebral artery flow.  Compared to the study done on 06/12/2019, left ICA stenosis of 16-49% not present.  There is mild heterogenous plaque noted in the bilateral carotid arteries.  Follow up studies is appropriate if clinically indicated.   Lower Extremity Dopplers  [11/27/2016]: No hemodynamically significant stenoses are identified in the lower extremity arterial system. This exam reveals normal perfusion of the lower extremity. Bilateral ABI 1.0. Biphasic waveform in the anterior tibial vessel suggests mild diffuse disease.  Echocardiogram  [06/03/2016]: Hospital: Normal LV systolic function, EF 60-65%. Pseudo-normal left ventricle her filling pattern. No significant valvular abnormality.  Nuclear stress test  [01/28/2015]: 1. The resting electrocardiogram demonstrated normal sinus rhythm, normal resting conduction, no resting arrhythmias and normal rest repolarization. The stress electrocardiogram was normal. Exercise using Bruce protocol for 5 minutes. The patient completed an estimated workload of 7.05 METS, 88% of the maximum predicted heart rate. The stress test was terminated because of fatigue. 2. Myocardial perfusion imaging is  normal. Overall left ventricular systolic function was normal without regional wall motion abnormalities. The left ventricular ejection fraction was 79%.  EKG:   EKG 04/13/2021: Normal sinus rhythm heart rate of 66 bpm, left atrial enlargement, normal axis, poor R wave progression, probably normal variant.  Nonspecific anterior T wave abnormality, probably normal.  No significant change from 06/03/2020.  Assessment     ICD-10-CM   1. Primary hypertension  I10 EKG 12-Lead    Ambulatory referral to Internal Medicine    2. Hypercholesteremia  E78.00 Ambulatory referral to Internal Medicine    3. Cerebrovascular accident (CVA) due to thrombosis of right middle cerebral artery (HCC) 2017  I63.311       No orders of the defined types were placed in this encounter.  Medications Discontinued During This Encounter  Medication Reason   ciprofloxacin (CIPRO) 250 MG tablet Error   Coenzyme Q10 10 MG capsule Error   cyclobenzaprine (FLEXERIL) 5 MG tablet Error   famotidine (PEPCID) 20 MG tablet Error   naproxen (NAPROSYN) 375 MG tablet Error   pravastatin (PRAVACHOL) 40 MG tablet Error   SODIUM FLUORIDE 5000 SENSITIVE 1.1-5 % GEL Error   tiZANidine (ZANAFLEX) 4 MG tablet Error   triamcinolone (KENALOG) 0.1 % paste Error   aspirin 325 MG tablet Completed Course   Recommendations:  Normal Kuzmich  is a 80 y.o. with h/o hyperlipidemia, stroke on 06/03/2016 with left hemiparesis. She has very mild residual deficits with speach and most of her leg weakness has now resolved.  She is followed in our office for hypertension, carotid artery stenosis, hyperlipidemia.  Since being on aggressive medical therapy, repeat carotid artery duplex revealed very minimal plaque.  She remains asymptomatic without any recurrence of neurologic deficits and it has been 5 years since her stroke, advised her to discontinue aspirin and continue Plavix indefinitely.  Blood pressure is well controlled, lipids are at  goal, she does not have a PCP and I will make a referral to see Dr. Corky Downs.  She will need routine labs when she establishes care.  From cardiac standpoint she remained  stable, I will see her back on a as needed basis.  CC: Jamison Oka, MD   Yates Decamp, MD 04/13/2021, 2:51 PM Office: [phone]

## 2021-05-19 ENCOUNTER — Ambulatory Visit: Payer: Medicare Other | Admitting: Podiatry

## 2021-06-05 ENCOUNTER — Ambulatory Visit: Payer: Medicare Other | Admitting: Cardiology

## 2021-07-27 ENCOUNTER — Other Ambulatory Visit: Payer: Self-pay | Admitting: Family Medicine

## 2021-07-27 DIAGNOSIS — I8391 Asymptomatic varicose veins of right lower extremity: Secondary | ICD-10-CM

## 2021-08-02 ENCOUNTER — Other Ambulatory Visit: Payer: Self-pay | Admitting: Family Medicine

## 2021-08-02 DIAGNOSIS — I8391 Asymptomatic varicose veins of right lower extremity: Secondary | ICD-10-CM

## 2021-08-09 ENCOUNTER — Other Ambulatory Visit: Payer: Self-pay | Admitting: Family Medicine

## 2021-08-09 DIAGNOSIS — Z1239 Encounter for other screening for malignant neoplasm of breast: Secondary | ICD-10-CM

## 2021-08-31 ENCOUNTER — Emergency Department (HOSPITAL_BASED_OUTPATIENT_CLINIC_OR_DEPARTMENT_OTHER)
Admission: EM | Admit: 2021-08-31 | Discharge: 2021-08-31 | Disposition: A | Discharge status: Home / Self Care | Payer: Medicare Other | Source: Home / Self Care | Attending: Emergency Medicine | Admitting: Emergency Medicine

## 2021-08-31 ENCOUNTER — Encounter (HOSPITAL_COMMUNITY): Payer: Self-pay

## 2021-08-31 ENCOUNTER — Other Ambulatory Visit: Payer: Self-pay

## 2021-08-31 ENCOUNTER — Emergency Department (HOSPITAL_COMMUNITY)
Admission: EM | Admit: 2021-08-31 | Discharge: 2021-08-31 | Disposition: A | Discharge status: Home / Self Care | Payer: Medicare Other | Attending: Emergency Medicine | Admitting: Emergency Medicine

## 2021-08-31 DIAGNOSIS — X58XXXA Exposure to other specified factors, initial encounter: Secondary | ICD-10-CM | POA: Diagnosis not present

## 2021-08-31 DIAGNOSIS — S8012XA Contusion of left lower leg, initial encounter: Secondary | ICD-10-CM | POA: Diagnosis not present

## 2021-08-31 DIAGNOSIS — I8393 Asymptomatic varicose veins of bilateral lower extremities: Secondary | ICD-10-CM

## 2021-08-31 DIAGNOSIS — M79605 Pain in left leg: Secondary | ICD-10-CM | POA: Diagnosis not present

## 2021-08-31 DIAGNOSIS — M79604 Pain in right leg: Secondary | ICD-10-CM

## 2021-08-31 DIAGNOSIS — Z79899 Other long term (current) drug therapy: Secondary | ICD-10-CM | POA: Insufficient documentation

## 2021-08-31 DIAGNOSIS — S8002XA Contusion of left knee, initial encounter: Secondary | ICD-10-CM | POA: Diagnosis not present

## 2021-08-31 DIAGNOSIS — S8992XA Unspecified injury of left lower leg, initial encounter: Secondary | ICD-10-CM | POA: Diagnosis present

## 2021-08-31 DIAGNOSIS — Z7901 Long term (current) use of anticoagulants: Secondary | ICD-10-CM | POA: Diagnosis not present

## 2021-08-31 DIAGNOSIS — I10 Essential (primary) hypertension: Secondary | ICD-10-CM | POA: Diagnosis not present

## 2021-08-31 DIAGNOSIS — M25562 Pain in left knee: Secondary | ICD-10-CM | POA: Diagnosis not present

## 2021-08-31 DIAGNOSIS — T148XXA Other injury of unspecified body region, initial encounter: Secondary | ICD-10-CM

## 2021-08-31 LAB — CBC WITH DIFFERENTIAL/PLATELET
Abs Immature Granulocytes: 0 10*3/uL (ref 0.00–0.07)
Basophils Absolute: 0 10*3/uL (ref 0.0–0.1)
Basophils Relative: 1 %
Eosinophils Absolute: 0.1 10*3/uL (ref 0.0–0.5)
Eosinophils Relative: 2 %
HCT: 45.3 % (ref 36.0–46.0)
Hemoglobin: 14.7 g/dL (ref 12.0–15.0)
Immature Granulocytes: 0 %
Lymphocytes Relative: 38 %
Lymphs Abs: 1.8 10*3/uL (ref 0.7–4.0)
MCH: 31 pg (ref 26.0–34.0)
MCHC: 32.5 g/dL (ref 30.0–36.0)
MCV: 95.6 fL (ref 80.0–100.0)
Monocytes Absolute: 0.4 10*3/uL (ref 0.1–1.0)
Monocytes Relative: 8 %
Neutro Abs: 2.5 10*3/uL (ref 1.7–7.7)
Neutrophils Relative %: 51 %
Platelets: 155 10*3/uL (ref 150–400)
RBC: 4.74 MIL/uL (ref 3.87–5.11)
RDW: 14.1 % (ref 11.5–15.5)
WBC: 4.9 10*3/uL (ref 4.0–10.5)
nRBC: 0 % (ref 0.0–0.2)

## 2021-08-31 LAB — COMPREHENSIVE METABOLIC PANEL
ALT: 32 U/L (ref 0–44)
AST: 33 U/L (ref 15–41)
Albumin: 4.1 g/dL (ref 3.5–5.0)
Alkaline Phosphatase: 36 U/L — ABNORMAL LOW (ref 38–126)
Anion gap: 8 (ref 5–15)
BUN: 13 mg/dL (ref 8–23)
CO2: 28 mmol/L (ref 22–32)
Calcium: 9.4 mg/dL (ref 8.9–10.3)
Chloride: 104 mmol/L (ref 98–111)
Creatinine, Ser: 0.88 mg/dL (ref 0.44–1.00)
GFR, Estimated: >60 mL/min (ref >60)
Glucose, Bld: 123 mg/dL — ABNORMAL HIGH (ref 70–99)
Potassium: 3.6 mmol/L (ref 3.5–5.1)
Sodium: 140 mmol/L (ref 135–145)
Total Bilirubin: 0.7 mg/dL (ref 0.3–1.2)
Total Protein: 7.3 g/dL (ref 6.5–8.1)

## 2021-08-31 LAB — PROTIME-INR
INR: 1.1 (ref 0.8–1.2)
Prothrombin Time: 14 seconds (ref 11.4–15.2)

## 2021-08-31 LAB — APTT: aPTT: 30 seconds (ref 24–36)

## 2021-08-31 LAB — SEDIMENTATION RATE: Sed Rate: 6 mm/hr (ref 0–22)

## 2021-08-31 MED ORDER — ACETAMINOPHEN 500 MG PO TABS
1000.0000 mg | ORAL_TABLET | Freq: Once | ORAL | Status: AC
  Administered 2021-08-31: 1000 mg via ORAL
  Filled 2021-08-31: qty 2

## 2021-08-31 NOTE — ED Triage Notes (Signed)
Pt c/o varicose veins that are irritating and painful to her legs bilaterally starting yesterday. Pt states she has had to use a cane today to walk and that's not normal for her.

## 2021-08-31 NOTE — Discharge Instructions (Signed)
All the blood test today were normal.  The ultrasound did not show any signs of blood clots.  It is unclear what is causing the bruising.  Hold the Plavix next 3 days and then restart it.  Call your doctor tomorrow to set up an appointment for next week to be rechecked especially if things are not improving.  You can continue to take 2 extra strength Tylenol every 6 hours for discomfort.  Use the knee sleeve anytime you are up moving around to help with discomfort.

## 2021-08-31 NOTE — ED Notes (Signed)
An After Visit Summary was printed and given to the patient. Discharge instructions given and no further questions at this time.  Pt refused knee sleeve. Pt ambulating with her cane. Pt states she called an Benedetto Goad to take her home. Pt A&Ox4.

## 2021-08-31 NOTE — Progress Notes (Signed)
Bilateral lower extremity venous duplex completed. Refer to "CV Proc" under chart review to view preliminary results.  08/31/2021 8:08 PM Eula Fried., MHA, RVT, RDCS, RDMS

## 2021-08-31 NOTE — ED Provider Triage Note (Signed)
Emergency Medicine Provider Triage Evaluation Note  [name] , a 81 y.o. female  was evaluated in triage.  Pt complains of swollen veins in her lega and bruises all over.  Pt reports she is having trouble walking   Review of Systems  Positive: weakness Negative: fever  Physical Exam  BP 125/74    Pulse 80    Temp 98 F (36.7 C) (Oral)    Resp 18    SpO2 98%  Gen:   Awake, no distress   Resp:  Normal effort  MSK:   Moves extremities without difficulty  Other:    Medical Decision Making  Medically screening exam initiated at 4:33 PM.  Appropriate orders placed.  [name] was informed that the remainder of the evaluation will be completed by another provider, this initial triage assessment does not replace that evaluation, and the importance of remaining in the ED until their evaluation is complete.     [name], [place] 08/31/21 [phone]

## 2021-08-31 NOTE — ED Provider Notes (Signed)
[facility] DEPT Provider Note   CSN: [id number] Arrival date & time: 08/31/21  1612     History  Chief Complaint  Patient presents with   Varicose Veins    [name] is a 81 y.o. female.  Patient is an 81 year old female with a history of prior stroke on Plavix, peripheral neuropathy, hypertension who is presenting today with a complaint of bruising of her left leg, engorged varicose veins in bilateral lower extremities and discomfort to her left lower extremity.  Patient reports symptoms started yesterday when she woke up she noticed there was some mild achiness in her left leg especially when she tried to walk and there were bruises on her leg.  When she went to bed on Tuesday she felt normal and she had no issues.  She denies any falls or any trauma that she is aware of.  She does take Plavix regularly but denies any other aspirin or NSAID use.  She reports she has varicose veins but she has never seen them this engorged before.  She has no abdominal pain, chest pain, shortness of breath.  No fevers or chills.  No bruises present on the right leg or in the abdomen.  She does note her right leg feels a bit achy as well but not like her left.  She also reports her left leg feels a little bit weak but she does have a history of hemiparesis after stroke.  However she reports now she is using a cane which she normally does not have to do because of the discomfort in her leg.  The history is provided by the patient.      Home Medications Prior to Admission medications   Medication Sig Start Date End Date Taking? Authorizing Provider  acetaminophen (TYLENOL) 325 MG tablet Take 650 mg by mouth every 6 (six) hours as needed for mild pain or headache.    [provider]  alendronate (FOSAMAX) 70 MG tablet Take 70 mg by mouth once a week. 09/30/20   [provider]  clopidogrel (PLAVIX) 75 MG tablet     [provider]  Coenzyme  Q10 (CO Q 10 PO)     [provider]  loratadine (CLARITIN) 10 MG tablet     [provider]  magnesium hydroxide (MILK OF MAGNESIA) 400 MG/5ML suspension     [provider]  Multiple Vitamins-Minerals (MULTIVITAMIN WITH MINERALS) tablet Take 1 tablet by mouth daily.    [provider]  Omega-3 Fatty Acids (FISH OIL PO)     [provider]  Omega-3 Fatty Acids (FISH OIL) 1000 MG CAPS Take 1,000 mg by mouth daily.     [provider]  rosuvastatin (CRESTOR) 40 MG tablet Take 1 tablet (40 mg total) by mouth daily. 12/30/19   Daleen Squibb, MD      Allergies    Atorvastatin, Penicillin g, and Penicillins    Review of Systems   Review of Systems  Physical Exam Updated Vital Signs BP 125/74    Pulse 80    Temp 98 F (36.7 C) (Oral)    Resp 18    SpO2 98%  Physical Exam Vitals and nursing note reviewed.  Constitutional:      General: She is not in acute distress.    Appearance: Normal appearance. She is well-developed.  HENT:     Head: Normocephalic and atraumatic.  Eyes:     Pupils: Pupils are equal, round, and reactive to  light.  Cardiovascular:     Rate and Rhythm: Normal rate and regular rhythm.     Heart sounds: Normal heart sounds. No murmur heard.   No friction rub.  Pulmonary:     Effort: Pulmonary effort is normal.     Breath sounds: Normal breath sounds. No wheezing or rales.  Abdominal:     General: Bowel sounds are normal. There is no distension.     Palpations: Abdomen is soft.     Tenderness: There is no abdominal tenderness. There is no guarding or rebound.  Musculoskeletal:        General: Swelling and tenderness present. Normal range of motion.     Right lower leg: No edema.     Left lower leg: No edema.     Comments: Bilateral engorged varicose veins.  Ecchymosis scattered over the left lower leg.  Minimal tenderness with palpation over the knee.  No knee effusion or calf firmness.  1+ DP pulse in  bilateral feet and feet are warm  Skin:    General: Skin is warm and dry.     Findings: No rash.  Neurological:     Mental Status: She is alert and oriented to person, place, and time. Mental status is at baseline.     Cranial Nerves: No cranial nerve deficit.  Psychiatric:        Mood and Affect: Mood normal.        Behavior: Behavior normal.    ED Results / Procedures / Treatments   Labs (all labs ordered are listed, but only abnormal results are displayed) Labs Reviewed  COMPREHENSIVE METABOLIC PANEL - Abnormal; Notable for the following components:      Result Value   Glucose, Bld 123 (*)    Alkaline Phosphatase 36 (*)    All other components within normal limits  CBC WITH DIFFERENTIAL/PLATELET  APTT  PROTIME-INR  SEDIMENTATION RATE    EKG None  Radiology No results found.  Procedures Procedures    Medications Ordered in ED Medications  acetaminophen (TYLENOL) tablet 1,000 mg (1,000 mg Oral Given 08/31/21 1757)    ED Course/ Medical Decision Making/ A&P                           Medical Decision Making Amount and/or Complexity of Data Reviewed Labs: ordered. ECG/medicine tests: ordered.  Risk OTC drugs.   Patient presenting today with complaints of bilateral lower leg pain left greater than right and bruising to the left lower extremity in various spots down the leg.  She denies any known trauma.  On exam patient does not have evidence of peripheral edema but does have engorged varicose veins on both lower extremities which patient reports is abnormal.  She has no calf pain or groin pain bilaterally.  She has ecchymosis scattered over the left lower extremity.  She does take Plavix.  There is no infectious findings.  Possible vasculitis vs idiopathic bruising.  Lower suspicion for DVT.  8:06 PM I independently evaluated patient's labs and her CBC, CMP, sed rate and coags are all within normal limits.  Low suspicion for inflammatory process at this time.   Patient is ambulatory but does appear to have more pain in the left knee.  Again on reevaluation there is no erythema but possible mild joint swelling.  Low suspicion for septic joint but possibility for arthritis.  She was placed in a knee sleeve.  She will continue to use Tylenol as  needed.  She will hold her Plavix for few days given the excessive bruising.  She will follow-up with her PCP.  Patient has no social determinants affecting her discharge today.  She does not meet any admission criteria.  All the findings were discussed with the patient and her questions were answered.        Final Clinical Impression(s) / ED Diagnoses Final diagnoses:  Acute pain of left knee  Bruising    Rx / DC Orders ED Discharge Orders     None         Blanchie Dessert, MD 08/31/21 2007

## 2021-09-05 DIAGNOSIS — M1712 Unilateral primary osteoarthritis, left knee: Secondary | ICD-10-CM | POA: Diagnosis not present

## 2021-09-05 DIAGNOSIS — M25462 Effusion, left knee: Secondary | ICD-10-CM | POA: Diagnosis not present

## 2021-09-05 DIAGNOSIS — D692 Other nonthrombocytopenic purpura: Secondary | ICD-10-CM | POA: Diagnosis not present

## 2021-09-05 DIAGNOSIS — K5909 Other constipation: Secondary | ICD-10-CM | POA: Diagnosis not present

## 2021-09-05 DIAGNOSIS — Z8673 Personal history of transient ischemic attack (TIA), and cerebral infarction without residual deficits: Secondary | ICD-10-CM | POA: Diagnosis not present

## 2021-09-05 DIAGNOSIS — J302 Other seasonal allergic rhinitis: Secondary | ICD-10-CM | POA: Diagnosis not present

## 2021-09-08 DIAGNOSIS — M25562 Pain in left knee: Secondary | ICD-10-CM | POA: Diagnosis not present

## 2021-10-19 ENCOUNTER — Ambulatory Visit: Payer: Medicare Other

## 2021-10-20 ENCOUNTER — Ambulatory Visit
Admission: RE | Admit: 2021-10-20 | Discharge: 2021-10-20 | Disposition: A | Discharge status: Home / Self Care | Payer: Medicare Other | Source: Ambulatory Visit | Attending: Family Medicine | Admitting: Family Medicine

## 2021-10-20 ENCOUNTER — Ambulatory Visit: Payer: Medicare Other

## 2021-10-20 DIAGNOSIS — Z0000 Encounter for general adult medical examination without abnormal findings: Secondary | ICD-10-CM | POA: Diagnosis not present

## 2021-10-20 DIAGNOSIS — I10 Essential (primary) hypertension: Secondary | ICD-10-CM | POA: Diagnosis not present

## 2021-10-20 DIAGNOSIS — E785 Hyperlipidemia, unspecified: Secondary | ICD-10-CM | POA: Diagnosis not present

## 2021-10-20 DIAGNOSIS — Z1231 Encounter for screening mammogram for malignant neoplasm of breast: Secondary | ICD-10-CM | POA: Diagnosis not present

## 2021-10-20 DIAGNOSIS — Z1239 Encounter for other screening for malignant neoplasm of breast: Secondary | ICD-10-CM

## 2021-10-23 DIAGNOSIS — M25462 Effusion, left knee: Secondary | ICD-10-CM | POA: Diagnosis not present

## 2021-10-23 DIAGNOSIS — R2689 Other abnormalities of gait and mobility: Secondary | ICD-10-CM | POA: Diagnosis not present

## 2021-10-23 DIAGNOSIS — I10 Essential (primary) hypertension: Secondary | ICD-10-CM | POA: Diagnosis not present

## 2021-10-23 DIAGNOSIS — E785 Hyperlipidemia, unspecified: Secondary | ICD-10-CM | POA: Diagnosis not present

## 2021-10-23 DIAGNOSIS — M1712 Unilateral primary osteoarthritis, left knee: Secondary | ICD-10-CM | POA: Diagnosis not present

## 2021-10-23 DIAGNOSIS — J302 Other seasonal allergic rhinitis: Secondary | ICD-10-CM | POA: Diagnosis not present

## 2021-10-23 DIAGNOSIS — Z8673 Personal history of transient ischemic attack (TIA), and cerebral infarction without residual deficits: Secondary | ICD-10-CM | POA: Diagnosis not present

## 2021-10-23 DIAGNOSIS — R413 Other amnesia: Secondary | ICD-10-CM | POA: Diagnosis not present

## 2021-10-23 DIAGNOSIS — D692 Other nonthrombocytopenic purpura: Secondary | ICD-10-CM | POA: Diagnosis not present

## 2021-10-23 DIAGNOSIS — K5909 Other constipation: Secondary | ICD-10-CM | POA: Diagnosis not present

## 2021-10-26 ENCOUNTER — Other Ambulatory Visit: Payer: Self-pay | Admitting: Family Medicine

## 2021-10-26 DIAGNOSIS — I6529 Occlusion and stenosis of unspecified carotid artery: Secondary | ICD-10-CM

## 2021-10-29 DIAGNOSIS — R2689 Other abnormalities of gait and mobility: Secondary | ICD-10-CM | POA: Diagnosis not present

## 2021-10-29 DIAGNOSIS — M1712 Unilateral primary osteoarthritis, left knee: Secondary | ICD-10-CM | POA: Diagnosis not present

## 2021-10-29 DIAGNOSIS — Z7982 Long term (current) use of aspirin: Secondary | ICD-10-CM | POA: Diagnosis not present

## 2021-10-29 DIAGNOSIS — K5909 Other constipation: Secondary | ICD-10-CM | POA: Diagnosis not present

## 2021-10-29 DIAGNOSIS — M810 Age-related osteoporosis without current pathological fracture: Secondary | ICD-10-CM | POA: Diagnosis not present

## 2021-10-29 DIAGNOSIS — M25462 Effusion, left knee: Secondary | ICD-10-CM | POA: Diagnosis not present

## 2021-10-29 DIAGNOSIS — D692 Other nonthrombocytopenic purpura: Secondary | ICD-10-CM | POA: Diagnosis not present

## 2021-10-29 DIAGNOSIS — R413 Other amnesia: Secondary | ICD-10-CM | POA: Diagnosis not present

## 2021-10-29 DIAGNOSIS — K219 Gastro-esophageal reflux disease without esophagitis: Secondary | ICD-10-CM | POA: Diagnosis not present

## 2021-10-29 DIAGNOSIS — I8391 Asymptomatic varicose veins of right lower extremity: Secondary | ICD-10-CM | POA: Diagnosis not present

## 2021-10-29 DIAGNOSIS — I10 Essential (primary) hypertension: Secondary | ICD-10-CM | POA: Diagnosis not present

## 2021-10-29 DIAGNOSIS — I6529 Occlusion and stenosis of unspecified carotid artery: Secondary | ICD-10-CM | POA: Diagnosis not present

## 2021-10-29 DIAGNOSIS — E785 Hyperlipidemia, unspecified: Secondary | ICD-10-CM | POA: Diagnosis not present

## 2021-11-03 DIAGNOSIS — K5909 Other constipation: Secondary | ICD-10-CM | POA: Diagnosis not present

## 2021-11-03 DIAGNOSIS — I8391 Asymptomatic varicose veins of right lower extremity: Secondary | ICD-10-CM | POA: Diagnosis not present

## 2021-11-03 DIAGNOSIS — I10 Essential (primary) hypertension: Secondary | ICD-10-CM | POA: Diagnosis not present

## 2021-11-03 DIAGNOSIS — R413 Other amnesia: Secondary | ICD-10-CM | POA: Diagnosis not present

## 2021-11-03 DIAGNOSIS — M25462 Effusion, left knee: Secondary | ICD-10-CM | POA: Diagnosis not present

## 2021-11-03 DIAGNOSIS — M1712 Unilateral primary osteoarthritis, left knee: Secondary | ICD-10-CM | POA: Diagnosis not present

## 2021-11-03 DIAGNOSIS — E785 Hyperlipidemia, unspecified: Secondary | ICD-10-CM | POA: Diagnosis not present

## 2021-11-03 DIAGNOSIS — K219 Gastro-esophageal reflux disease without esophagitis: Secondary | ICD-10-CM | POA: Diagnosis not present

## 2021-11-03 DIAGNOSIS — D692 Other nonthrombocytopenic purpura: Secondary | ICD-10-CM | POA: Diagnosis not present

## 2021-11-03 DIAGNOSIS — I6529 Occlusion and stenosis of unspecified carotid artery: Secondary | ICD-10-CM | POA: Diagnosis not present

## 2021-11-03 DIAGNOSIS — Z7982 Long term (current) use of aspirin: Secondary | ICD-10-CM | POA: Diagnosis not present

## 2021-11-03 DIAGNOSIS — M810 Age-related osteoporosis without current pathological fracture: Secondary | ICD-10-CM | POA: Diagnosis not present

## 2021-11-03 DIAGNOSIS — R2689 Other abnormalities of gait and mobility: Secondary | ICD-10-CM | POA: Diagnosis not present

## 2021-11-06 DIAGNOSIS — M25462 Effusion, left knee: Secondary | ICD-10-CM | POA: Diagnosis not present

## 2021-11-06 DIAGNOSIS — M1712 Unilateral primary osteoarthritis, left knee: Secondary | ICD-10-CM | POA: Diagnosis not present

## 2021-11-06 DIAGNOSIS — R413 Other amnesia: Secondary | ICD-10-CM | POA: Diagnosis not present

## 2021-11-06 DIAGNOSIS — R2689 Other abnormalities of gait and mobility: Secondary | ICD-10-CM | POA: Diagnosis not present

## 2021-11-09 DIAGNOSIS — M1712 Unilateral primary osteoarthritis, left knee: Secondary | ICD-10-CM | POA: Diagnosis not present

## 2021-11-09 DIAGNOSIS — Z7982 Long term (current) use of aspirin: Secondary | ICD-10-CM | POA: Diagnosis not present

## 2021-11-09 DIAGNOSIS — I10 Essential (primary) hypertension: Secondary | ICD-10-CM | POA: Diagnosis not present

## 2021-11-09 DIAGNOSIS — M25462 Effusion, left knee: Secondary | ICD-10-CM | POA: Diagnosis not present

## 2021-11-09 DIAGNOSIS — M810 Age-related osteoporosis without current pathological fracture: Secondary | ICD-10-CM | POA: Diagnosis not present

## 2021-11-09 DIAGNOSIS — R413 Other amnesia: Secondary | ICD-10-CM | POA: Diagnosis not present

## 2021-11-09 DIAGNOSIS — E785 Hyperlipidemia, unspecified: Secondary | ICD-10-CM | POA: Diagnosis not present

## 2021-11-09 DIAGNOSIS — D692 Other nonthrombocytopenic purpura: Secondary | ICD-10-CM | POA: Diagnosis not present

## 2021-11-09 DIAGNOSIS — I8391 Asymptomatic varicose veins of right lower extremity: Secondary | ICD-10-CM | POA: Diagnosis not present

## 2021-11-09 DIAGNOSIS — I6529 Occlusion and stenosis of unspecified carotid artery: Secondary | ICD-10-CM | POA: Diagnosis not present

## 2021-11-09 DIAGNOSIS — R2689 Other abnormalities of gait and mobility: Secondary | ICD-10-CM | POA: Diagnosis not present

## 2021-11-09 DIAGNOSIS — K219 Gastro-esophageal reflux disease without esophagitis: Secondary | ICD-10-CM | POA: Diagnosis not present

## 2021-11-09 DIAGNOSIS — K5909 Other constipation: Secondary | ICD-10-CM | POA: Diagnosis not present

## 2021-11-10 DIAGNOSIS — D692 Other nonthrombocytopenic purpura: Secondary | ICD-10-CM | POA: Diagnosis not present

## 2021-11-10 DIAGNOSIS — E785 Hyperlipidemia, unspecified: Secondary | ICD-10-CM | POA: Diagnosis not present

## 2021-11-10 DIAGNOSIS — R2689 Other abnormalities of gait and mobility: Secondary | ICD-10-CM | POA: Diagnosis not present

## 2021-11-10 DIAGNOSIS — K219 Gastro-esophageal reflux disease without esophagitis: Secondary | ICD-10-CM | POA: Diagnosis not present

## 2021-11-10 DIAGNOSIS — M810 Age-related osteoporosis without current pathological fracture: Secondary | ICD-10-CM | POA: Diagnosis not present

## 2021-11-10 DIAGNOSIS — K5909 Other constipation: Secondary | ICD-10-CM | POA: Diagnosis not present

## 2021-11-10 DIAGNOSIS — I8391 Asymptomatic varicose veins of right lower extremity: Secondary | ICD-10-CM | POA: Diagnosis not present

## 2021-11-10 DIAGNOSIS — R413 Other amnesia: Secondary | ICD-10-CM | POA: Diagnosis not present

## 2021-11-10 DIAGNOSIS — M25462 Effusion, left knee: Secondary | ICD-10-CM | POA: Diagnosis not present

## 2021-11-10 DIAGNOSIS — M1712 Unilateral primary osteoarthritis, left knee: Secondary | ICD-10-CM | POA: Diagnosis not present

## 2021-11-10 DIAGNOSIS — I10 Essential (primary) hypertension: Secondary | ICD-10-CM | POA: Diagnosis not present

## 2021-11-10 DIAGNOSIS — I6529 Occlusion and stenosis of unspecified carotid artery: Secondary | ICD-10-CM | POA: Diagnosis not present

## 2021-11-10 DIAGNOSIS — Z7982 Long term (current) use of aspirin: Secondary | ICD-10-CM | POA: Diagnosis not present

## 2021-11-13 DIAGNOSIS — R2689 Other abnormalities of gait and mobility: Secondary | ICD-10-CM | POA: Diagnosis not present

## 2021-11-13 DIAGNOSIS — K5909 Other constipation: Secondary | ICD-10-CM | POA: Diagnosis not present

## 2021-11-13 DIAGNOSIS — I10 Essential (primary) hypertension: Secondary | ICD-10-CM | POA: Diagnosis not present

## 2021-11-13 DIAGNOSIS — K219 Gastro-esophageal reflux disease without esophagitis: Secondary | ICD-10-CM | POA: Diagnosis not present

## 2021-11-13 DIAGNOSIS — R413 Other amnesia: Secondary | ICD-10-CM | POA: Diagnosis not present

## 2021-11-13 DIAGNOSIS — M25462 Effusion, left knee: Secondary | ICD-10-CM | POA: Diagnosis not present

## 2021-11-13 DIAGNOSIS — I6529 Occlusion and stenosis of unspecified carotid artery: Secondary | ICD-10-CM | POA: Diagnosis not present

## 2021-11-13 DIAGNOSIS — D692 Other nonthrombocytopenic purpura: Secondary | ICD-10-CM | POA: Diagnosis not present

## 2021-11-13 DIAGNOSIS — Z7982 Long term (current) use of aspirin: Secondary | ICD-10-CM | POA: Diagnosis not present

## 2021-11-13 DIAGNOSIS — M1712 Unilateral primary osteoarthritis, left knee: Secondary | ICD-10-CM | POA: Diagnosis not present

## 2021-11-13 DIAGNOSIS — E785 Hyperlipidemia, unspecified: Secondary | ICD-10-CM | POA: Diagnosis not present

## 2021-11-13 DIAGNOSIS — I8391 Asymptomatic varicose veins of right lower extremity: Secondary | ICD-10-CM | POA: Diagnosis not present

## 2021-11-13 DIAGNOSIS — M810 Age-related osteoporosis without current pathological fracture: Secondary | ICD-10-CM | POA: Diagnosis not present

## 2021-11-16 DIAGNOSIS — Z7982 Long term (current) use of aspirin: Secondary | ICD-10-CM | POA: Diagnosis not present

## 2021-11-16 DIAGNOSIS — K219 Gastro-esophageal reflux disease without esophagitis: Secondary | ICD-10-CM | POA: Diagnosis not present

## 2021-11-16 DIAGNOSIS — M1712 Unilateral primary osteoarthritis, left knee: Secondary | ICD-10-CM | POA: Diagnosis not present

## 2021-11-16 DIAGNOSIS — M810 Age-related osteoporosis without current pathological fracture: Secondary | ICD-10-CM | POA: Diagnosis not present

## 2021-11-16 DIAGNOSIS — M25462 Effusion, left knee: Secondary | ICD-10-CM | POA: Diagnosis not present

## 2021-11-16 DIAGNOSIS — I8391 Asymptomatic varicose veins of right lower extremity: Secondary | ICD-10-CM | POA: Diagnosis not present

## 2021-11-16 DIAGNOSIS — R2689 Other abnormalities of gait and mobility: Secondary | ICD-10-CM | POA: Diagnosis not present

## 2021-11-16 DIAGNOSIS — E785 Hyperlipidemia, unspecified: Secondary | ICD-10-CM | POA: Diagnosis not present

## 2021-11-16 DIAGNOSIS — D692 Other nonthrombocytopenic purpura: Secondary | ICD-10-CM | POA: Diagnosis not present

## 2021-11-16 DIAGNOSIS — K5909 Other constipation: Secondary | ICD-10-CM | POA: Diagnosis not present

## 2021-11-16 DIAGNOSIS — I6529 Occlusion and stenosis of unspecified carotid artery: Secondary | ICD-10-CM | POA: Diagnosis not present

## 2021-11-16 DIAGNOSIS — I10 Essential (primary) hypertension: Secondary | ICD-10-CM | POA: Diagnosis not present

## 2021-11-16 DIAGNOSIS — R413 Other amnesia: Secondary | ICD-10-CM | POA: Diagnosis not present

## 2021-12-20 DIAGNOSIS — R413 Other amnesia: Secondary | ICD-10-CM | POA: Diagnosis not present

## 2021-12-20 DIAGNOSIS — R5383 Other fatigue: Secondary | ICD-10-CM | POA: Diagnosis not present

## 2021-12-20 DIAGNOSIS — J019 Acute sinusitis, unspecified: Secondary | ICD-10-CM | POA: Diagnosis not present

## 2021-12-20 DIAGNOSIS — R350 Frequency of micturition: Secondary | ICD-10-CM | POA: Diagnosis not present

## 2021-12-20 DIAGNOSIS — R490 Dysphonia: Secondary | ICD-10-CM | POA: Diagnosis not present

## 2021-12-20 DIAGNOSIS — R2689 Other abnormalities of gait and mobility: Secondary | ICD-10-CM | POA: Diagnosis not present

## 2021-12-20 DIAGNOSIS — R682 Dry mouth, unspecified: Secondary | ICD-10-CM | POA: Diagnosis not present

## 2021-12-20 DIAGNOSIS — N3281 Overactive bladder: Secondary | ICD-10-CM | POA: Diagnosis not present

## 2021-12-20 DIAGNOSIS — J302 Other seasonal allergic rhinitis: Secondary | ICD-10-CM | POA: Diagnosis not present

## 2022-01-05 NOTE — Therapy (Incomplete)
OUTPATIENT PHYSICAL THERAPY NEURO EVALUATION   Patient Name: [name] MRN: [id number] DOB:December 29, 1940, 81 y.o., female Today's Date: 01/05/2022   PCP: Harvest Forest, MD  REFERRING PROVIDER: Harvest Forest, MD     Past Medical History:  Diagnosis Date   Acute sinus infection 01/15/2011   ANXIETY 10/03/2009   BARRETTS ESOPHAGUS 10/03/2009   CAROTID ARTERY DISEASE 08/31/2009   COLONIC POLYPS, HX OF 08/29/2009   CONSTIPATION, CHRONIC 10/03/2009   DEPRESSION 10/03/2009   DISC DISEASE, CERVICAL 08/29/2009   DISC DISEASE, LUMBAR 08/29/2009   Eustachian tube dysfunction 01/15/2011   GERD 08/29/2009   HYPERLIPIDEMIA 08/29/2009   Impaired glucose tolerance 01/14/2011   LEG PAIN, BILATERAL 08/29/2009   LOW BACK PAIN, CHRONIC 11/22/2009   PERIPHERAL NEUROPATHY 10/03/2009   PERIPHERAL VASCULAR DISEASE 08/29/2009   POSITIVE PPD 10/03/2009   Preventative health care 01/14/2011   Stroke (HCC)    Thyroid nodule 08/09/2011   TIA 11/22/2009   TRANSIENT ISCHEMIC ATTACK, HX OF 08/29/2009   URETHRAL STRICTURE 10/03/2009   VOCAL CORD POLYP, HX OF 10/03/2009   WRIST PAIN, BILATERAL 05/25/2010   Past Surgical History:  Procedure Laterality Date   hx of right ankle fracture     s/p vocal cord polyps  1984   chronic hoarseness   stress test negative  2003,2004,2006   TUBAL LIGATION     tubaligation     uterine prolapse     Patient Active Problem List   Diagnosis Date Noted   Dizziness 08/14/2017   Chronic swimmer's ear of both sides 12/13/2016   Acute left hemiparesis (HCC)    TIA (transient ischemic attack) 06/01/2016   CVA (cerebral vascular accident) (HCC) 06/01/2016   Transient cerebral ischemia    Pelvic prolapse 11/03/2014   Cystocele 09/29/2014   SUI (stress urinary incontinence, female) 09/29/2014   Complicated migraine 08/22/2014   Multinodular goiter 08/09/2011   Lumbar pain with radiation down right leg 04/11/2011   Rash 04/11/2011   Impaired glucose tolerance 01/14/2011    Preventative health care 01/14/2011   WRIST PAIN, BILATERAL 05/25/2010   TIA 11/22/2009   LOW BACK PAIN, CHRONIC 11/22/2009   ANXIETY 10/03/2009   DEPRESSION 10/03/2009   PERIPHERAL NEUROPATHY 10/03/2009   BARRETTS ESOPHAGUS 10/03/2009   CONSTIPATION, CHRONIC 10/03/2009   URETHRAL STRICTURE 10/03/2009   POSITIVE PPD 10/03/2009   VOCAL CORD POLYP, HX OF 10/03/2009   Carotid artery disease (HCC) 08/31/2009   Hyperlipemia 08/29/2009   PERIPHERAL VASCULAR DISEASE 08/29/2009   GERD 08/29/2009   DISC DISEASE, CERVICAL 08/29/2009   DISC DISEASE, LUMBAR 08/29/2009   LEG PAIN, BILATERAL 08/29/2009   TRANSIENT ISCHEMIC ATTACK, HX OF 08/29/2009   COLONIC POLYPS, HX OF 08/29/2009    ONSET DATE: ***  REFERRING DIAG: R26.89 (ICD-10-CM) - Other abnormalities of gait and mobility   THERAPY DIAG:  No diagnosis found.  Rationale for Evaluation and Treatment Rehabilitation  SUBJECTIVE:  SUBJECTIVE STATEMENT: *** Pt accompanied by: {accompnied:27141}  PERTINENT HISTORY: anxiety, depression, GERD, HLD, peripheral neuropathy, peripheral vascular disease, stroke, R ankle fx  PAIN:  Are you having pain? {OPRCPAIN:27236}  PRECAUTIONS: {Therapy precautions:24002}  WEIGHT BEARING RESTRICTIONS No  FALLS: Has patient fallen in last 6 months? {fallsyesno:27318}  LIVING ENVIRONMENT: Lives with: {OPRC lives with:25569::"lives with their family"} Lives in: {Lives in:25570} Stairs: {opstairs:27293} Has following equipment at home: {Assistive devices:23999}  PLOF: {PLOF:24004}  PATIENT GOALS ***  OBJECTIVE:   DIAGNOSTIC FINDINGS: none recent  COGNITION: Overall cognitive status: {cognition:24006}   SENSATION: {sensation:27233}  COORDINATION: ***  EDEMA:  {edema:24020}   POSTURE:  {posture:25561}  LOWER EXTREMITY ROM:     Active  Right Eval Left Eval  Hip flexion    Hip extension    Hip abduction    Hip adduction    Hip internal rotation    Hip external rotation    Knee flexion    Knee extension    Ankle dorsiflexion    Ankle plantarflexion    Ankle inversion    Ankle eversion     (Blank rows = not tested)  LOWER EXTREMITY MMT:    MMT Right Eval Left Eval  Hip flexion    Hip extension    Hip abduction    Hip adduction    Hip internal rotation    Hip external rotation    Knee flexion    Knee extension    Ankle dorsiflexion    Ankle plantarflexion    Ankle inversion    Ankle eversion    (Blank rows = not tested)  GAIT: Gait pattern: {gait characteristics:25376} Distance walked: *** Assistive device utilized: {Assistive devices:23999} Level of assistance: {Levels of assistance:24026} Comments: ***  FUNCTIONAL TESTs:  {Functional tests:24029}   TODAY'S TREATMENT:  ***   PATIENT EDUCATION: Education details: *** Person educated: {Person educated:25204} Education method: {Education Method:25205} Education comprehension: {Education Comprehension:25206}   HOME EXERCISE PROGRAM: ***   GOALS: Goals reviewed with patient? Yes  SHORT TERM GOALS: Target date: {follow up:25551}  Patient to be independent with initial HEP. Baseline: HEP initiated Goal status: INITIAL    LONG TERM GOALS: Target date: {follow up:25551}  Patient to be independent with advanced HEP. Baseline: Not yet initiated  Goal status: INITIAL  Patient to demonstrate B LE strength >/=4+/5.  Baseline: See above Goal status: INITIAL  Patient to demonstrate *** ROM WFL and without pain limiting.  Baseline: *** Goal status: INITIAL  Patient to report and demonstrate improved head, neck, and shoulder posture at rest and with activity.  Baseline: *** Goal status: INITIAL  Patient to demonstrate alternating reciprocal pattern when ascending and  descending stairs with good stability and 1 handrail as needed.   Baseline: Unable Goal status: INITIAL  Patient to score at least 20/24 on DGI in order to decrease risk of falls.  Baseline: *** Goal status: INITIAL  Patient to complete TUG in <14 sec with LRAD in order to decrease risk of falls.   Baseline: *** Goal status: INITIAL  Patient to demonstrate 5xSTS test in <15 sec in order to decrease risk of falls.  Baseline: *** Goal status: INITIAL  Patient to score at least ***/56 on Berg in order to decrease risk of falls.  Baseline: *** Goal status: INITIAL   ASSESSMENT:  CLINICAL IMPRESSION:  Patient is a 81 y/o F presenting to OPPT with c/o imbalance for the past ***  Patient today presenting with ***.    Patient was educated on gentle ***  HEP and reported understanding. Prior to current episode, patient was independent. Would benefit from skilled PT services *** x/week for *** weeks to address aforementioned impairments in order to optimize level of function.     OBJECTIVE IMPAIRMENTS {opptimpairments:25111}.   ACTIVITY LIMITATIONS {activitylimitations:27494}  PARTICIPATION LIMITATIONS: {participationrestrictions:25113}  PERSONAL FACTORS {Personal factors:25162} are also affecting patient's functional outcome.   REHAB POTENTIAL: {rehabpotential:25112}  CLINICAL DECISION MAKING: {clinical decision making:25114}  EVALUATION COMPLEXITY: {Evaluation complexity:25115}  PLAN: PT FREQUENCY: {rehab frequency:25116}  PT DURATION: {rehab duration:25117}  PLANNED INTERVENTIONS: {rehab planned interventions:25118::"Therapeutic exercises","Therapeutic activity","Neuromuscular re-education","Balance training","Gait training","Patient/Family education","Joint mobilization"}  PLAN FOR NEXT SESSION: ***   Tyrone Sage, PT 01/05/2022, 8:00 AM

## 2022-01-08 ENCOUNTER — Ambulatory Visit: Payer: Medicare Other | Admitting: Physical Therapy

## 2022-01-11 DIAGNOSIS — E559 Vitamin D deficiency, unspecified: Secondary | ICD-10-CM | POA: Diagnosis not present

## 2022-01-11 DIAGNOSIS — R5383 Other fatigue: Secondary | ICD-10-CM | POA: Diagnosis not present

## 2022-01-11 DIAGNOSIS — R413 Other amnesia: Secondary | ICD-10-CM | POA: Diagnosis not present

## 2022-01-11 NOTE — Therapy (Signed)
OUTPATIENT PHYSICAL THERAPY NEURO EVALUATION   Patient Name: [name] MRN: [id number] DOB:[birth year], 81 y.o., female Today's Date: 01/15/2022   PCP: Harvest Forest, MD  REFERRING PROVIDER: Harvest Forest, MD    PT End of Session - 01/15/22 1405     Visit Number 1    Number of Visits 13    Date for PT Re-Evaluation 02/26/22    Authorization Type [employer]    PT Start Time 1313    PT Stop Time 1352    PT Time Calculation (min) 39 min    Equipment Utilized During Treatment Gait belt    Activity Tolerance Patient tolerated treatment well    Behavior During Therapy WFL for tasks assessed/performed             Past Medical History:  Diagnosis Date   Acute sinus infection 01/15/2011   ANXIETY 10/03/2009   BARRETTS ESOPHAGUS 10/03/2009   CAROTID ARTERY DISEASE 08/31/2009   COLONIC POLYPS, HX OF 08/29/2009   CONSTIPATION, CHRONIC 10/03/2009   DEPRESSION 10/03/2009   DISC DISEASE, CERVICAL 08/29/2009   DISC DISEASE, LUMBAR 08/29/2009   Eustachian tube dysfunction 01/15/2011   GERD 08/29/2009   HYPERLIPIDEMIA 08/29/2009   Impaired glucose tolerance 01/14/2011   LEG PAIN, BILATERAL 08/29/2009   LOW BACK PAIN, CHRONIC 11/22/2009   PERIPHERAL NEUROPATHY 10/03/2009   PERIPHERAL VASCULAR DISEASE 08/29/2009   POSITIVE PPD 10/03/2009   Preventative health care 01/14/2011   Stroke (HCC)    Thyroid nodule 08/09/2011   TIA 11/22/2009   TRANSIENT ISCHEMIC ATTACK, HX OF 08/29/2009   URETHRAL STRICTURE 10/03/2009   VOCAL CORD POLYP, HX OF 10/03/2009   WRIST PAIN, BILATERAL 05/25/2010   Past Surgical History:  Procedure Laterality Date   hx of right ankle fracture     s/p vocal cord polyps  1984   chronic hoarseness   stress test negative  2003,2004,2006   TUBAL LIGATION     tubaligation     uterine prolapse     Patient Active Problem List   Diagnosis Date Noted   Dizziness 08/14/2017   Chronic swimmer's ear of both sides 12/13/2016   Acute left hemiparesis (HCC)    TIA  (transient ischemic attack) 06/01/2016   CVA (cerebral vascular accident) (HCC) 06/01/2016   Transient cerebral ischemia    Pelvic prolapse 11/03/2014   Cystocele 09/29/2014   SUI (stress urinary incontinence, female) 09/29/2014   Complicated migraine 08/22/2014   Multinodular goiter 08/09/2011   Lumbar pain with radiation down right leg 04/11/2011   Rash 04/11/2011   Impaired glucose tolerance 01/14/2011   Preventative health care 01/14/2011   WRIST PAIN, BILATERAL 05/25/2010   TIA 11/22/2009   LOW BACK PAIN, CHRONIC 11/22/2009   ANXIETY 10/03/2009   DEPRESSION 10/03/2009   PERIPHERAL NEUROPATHY 10/03/2009   BARRETTS ESOPHAGUS 10/03/2009   CONSTIPATION, CHRONIC 10/03/2009   URETHRAL STRICTURE 10/03/2009   POSITIVE PPD 10/03/2009   VOCAL CORD POLYP, HX OF 10/03/2009   Carotid artery disease (HCC) 08/31/2009   Hyperlipemia 08/29/2009   PERIPHERAL VASCULAR DISEASE 08/29/2009   GERD 08/29/2009   DISC DISEASE, CERVICAL 08/29/2009   DISC DISEASE, LUMBAR 08/29/2009   LEG PAIN, BILATERAL 08/29/2009   TRANSIENT ISCHEMIC ATTACK, HX OF 08/29/2009   COLONIC POLYPS, HX OF 08/29/2009    ONSET DATE: last 6 months  REFERRING DIAG: R26.89 (ICD-10-CM) - Other abnormalities of gait and mobility   THERAPY DIAG:  Unsteadiness on feet  Other abnormalities of gait and mobility  Rationale for Evaluation and  Treatment Rehabilitation  SUBJECTIVE:                                                                                                                                                                                              SUBJECTIVE STATEMENT: Patient reports that she has been stumbling more in the last 6 months. Reports that about 9 months ago she was walking for exercise and able to go 2 miles but stopped d/t not feeling well. Denies lightheadedness or dizziness. Reports that she never had a stroke and was misdiagnosed.  Pt accompanied by: self  PERTINENT HISTORY: anxiety,  depression, GERD, HLD, peripheral neuropathy, peripheral vascular disease, R ankle fx   PAIN:  Are you having pain? No  PRECAUTIONS: Fall  WEIGHT BEARING RESTRICTIONS No  FALLS: Has patient fallen in last 6 months? No  LIVING ENVIRONMENT: Lives with: lives alone Lives in: House/apartment Stairs: Yes: External: 3 steps; on right going up Has following equipment at home: Single point cane, shower chair, and Grab bars  PLOF: Independent  PATIENT GOALS work on balance   OBJECTIVE:   DIAGNOSTIC FINDINGS: none recent  COGNITION: Overall cognitive status: No family/caregiver present to determine baseline cognitive functioning   SENSATION: Hx of peripheral neuropathy- reports tingling and pain in B feet   POSTURE: rounded shoulders, forward head, and increased thoracic kyphosis  LOWER EXTREMITY ROM:     Active  Right Eval Left Eval  Hip flexion    Hip extension    Hip abduction    Hip adduction    Hip internal rotation    Hip external rotation    Knee flexion    Knee extension    Ankle dorsiflexion 10 14  Ankle plantarflexion    Ankle inversion    Ankle eversion     (Blank rows = not tested)  LOWER EXTREMITY MMT:    MMT (in sitting) Right Eval Left Eval  Hip flexion 4 4  Hip extension    Hip abduction 4+ 4+  Hip adduction 4+ 4+  Hip internal rotation    Hip external rotation    Knee flexion 4 4+  Knee extension 4+ 4  Ankle dorsiflexion 4 4  Ankle plantarflexion 4+ 4+  Ankle inversion    Ankle eversion    (Blank rows = not tested)  GAIT: Gait pattern: step through pattern, decreased step length- Right, decreased step length- Left, and narrow BOS Assistive device utilized: None Level of assistance: Complete Independence    [facility] PT Assessment - 01/15/22 0001       Standardized Balance Assessment   Standardized Balance Assessment Dynamic Gait Index;Timed Up  and Go Test;Five Times Sit to Stand    Five times sit to stand comments  13.88 sec    without UEs; limited eccentric control; c/o L knee pain     Dynamic Gait Index   Level Surface Normal    Change in Gait Speed Mild Impairment    Gait with Horizontal Head Turns Normal    Gait with Vertical Head Turns Mild Impairment    Gait and Pivot Turn Mild Impairment    Step Over Obstacle Mild Impairment    Step Around Obstacles Mild Impairment    Steps Mild Impairment    Total Score 18              M-CTSIB  Condition 1: Firm Surface, EO 30 Sec, Mild Sway  Condition 2: Firm Surface, EC 30 Sec, Moderate Sway  Condition 3: Foam Surface, EO 30 Sec, Severe Sway  Condition 4: Foam Surface, EC NT Sec,  NT  Sway     PATIENT EDUCATION: Education details: prognosis, POC, HEP Person educated: Patient Education method: Explanation, Demonstration, Tactile cues, Verbal cues, and Handouts Education comprehension: verbalized understanding   HOME EXERCISE PROGRAM: Access Code: [email or web address] URL: [email or web address] Date: 01/15/2022 Prepared by: [facility] - Outpatient  Re[facility] - [facility]  Program Notes perform in a corner for safety  Exercises - Romberg Stance with Eyes Closed  - 1 x daily - 5 x weekly - 2 sets - 30 sec hold - Standing on Foam Pad  - 1 x daily - 5 x weekly - 2 sets - 30 sec hold - Heel Toe Raises with Counter Support  - 1 x daily - 5 x weekly - 2 sets - 10 reps - Marching with Resistance  - 1 x daily - 5 x weekly - 2 sets - 10 reps - Standing Toe Taps  - 1 x daily - 5 x weekly - 2 sets - 10 reps   GOALS: Goals reviewed with patient? Yes  SHORT TERM GOALS: Target date: 02/05/2022  Patient to be independent with initial HEP. Baseline: HEP initiated Goal status: INITIAL    LONG TERM GOALS: Target date: 02/26/2022  Patient to be independent with advanced HEP. Baseline: Not yet initiated  Goal status: INITIAL  Patient to demonstrate B LE strength >/=4+/5.  Baseline: See above Goal status: INITIAL  Patient to demonstrate  alternating reciprocal pattern when ascending and descending stairs with good stability and without handrail.   Baseline: Unable Goal status: INITIAL  Patient to score at least 20/24 on DGI in order to decrease risk of falls.  Baseline: 19/24 Goal status: INITIAL  Patient to demonstrate mild-moderate sway with M-CTSIB condition 3 to improve safety on uneven surfaces. Baseline: severe sway Goal status: INITIAL   ASSESSMENT:  CLINICAL IMPRESSION:  Patient is a 81 y/o F presenting to OPPT with c/o imbalance for the past 6 months. Currently ambulates without AD but reports that she uses cane when walking for longer durations. Patient would like to return to walking longer durations for exercise. Patient today presenting with mild B LE weakness, gait deviations, and difficulty maintaining balance with EC and on compliant surface. Patient scored 19/24 on DGI today, indicating an increased risk of falls. Patient was educated on gentle strengthening and balance HEP and reported understanding. Would benefit from skilled PT services 1-2 x/week for 6 weeks to address aforementioned impairments in order to optimize level of function.     OBJECTIVE IMPAIRMENTS Abnormal gait, decreased balance, decreased endurance, difficulty walking, decreased strength,  and pain.   ACTIVITY LIMITATIONS carrying, lifting, bending, standing, squatting, stairs, transfers, and bathing  PARTICIPATION LIMITATIONS: meal prep, cleaning, laundry, driving, shopping, community activity, yard work, and church  PERSONAL FACTORS Age, Fitness, Past/current experiences, Time since onset of injury/illness/exacerbation, and 3+ comorbidities: anxiety, depression, GERD, HLD, peripheral neuropathy, peripheral vascular disease, R ankle fx  are also affecting patient's functional outcome.   REHAB POTENTIAL: Good  CLINICAL DECISION MAKING: Evolving/moderate complexity  EVALUATION COMPLEXITY: Moderate  PLAN: PT FREQUENCY:  1-2x/week  PT DURATION: 6 weeks  PLANNED INTERVENTIONS: Therapeutic exercises, Therapeutic activity, Neuromuscular re-education, Balance training, Gait training, Patient/Family education, Joint mobilization, Stair training, Vestibular training, Canalith repositioning, Aquatic Therapy, Dry Needling, Electrical stimulation, Cryotherapy, Moist heat, Taping, Manual therapy, and Re-evaluation  PLAN FOR NEXT SESSION: reassess HEP, progress LE strengthening, balance with EC/foam, higher level dynamic balance activities    Anette Guarneri, PT, DPT 01/15/22 2:16 PM  [facility] [address] [place], [place] [address] Phone # [phone] Fax # [phone]

## 2022-01-15 ENCOUNTER — Encounter: Payer: Self-pay | Admitting: Physical Therapy

## 2022-01-15 ENCOUNTER — Ambulatory Visit: Payer: Medicare Other | Attending: Internal Medicine | Admitting: Physical Therapy

## 2022-01-15 DIAGNOSIS — R2681 Unsteadiness on feet: Secondary | ICD-10-CM | POA: Diagnosis not present

## 2022-01-15 DIAGNOSIS — R2689 Other abnormalities of gait and mobility: Secondary | ICD-10-CM | POA: Insufficient documentation

## 2022-01-15 DIAGNOSIS — M6281 Muscle weakness (generalized): Secondary | ICD-10-CM | POA: Diagnosis not present

## 2022-01-17 DIAGNOSIS — I739 Peripheral vascular disease, unspecified: Secondary | ICD-10-CM | POA: Diagnosis not present

## 2022-01-17 DIAGNOSIS — G3184 Mild cognitive impairment, so stated: Secondary | ICD-10-CM | POA: Diagnosis not present

## 2022-01-17 DIAGNOSIS — H9193 Unspecified hearing loss, bilateral: Secondary | ICD-10-CM | POA: Diagnosis not present

## 2022-01-17 DIAGNOSIS — R490 Dysphonia: Secondary | ICD-10-CM | POA: Diagnosis not present

## 2022-01-17 DIAGNOSIS — R2689 Other abnormalities of gait and mobility: Secondary | ICD-10-CM | POA: Diagnosis not present

## 2022-01-17 DIAGNOSIS — I83892 Varicose veins of left lower extremities with other complications: Secondary | ICD-10-CM | POA: Diagnosis not present

## 2022-01-17 DIAGNOSIS — I83891 Varicose veins of right lower extremities with other complications: Secondary | ICD-10-CM | POA: Diagnosis not present

## 2022-01-17 DIAGNOSIS — N3281 Overactive bladder: Secondary | ICD-10-CM | POA: Diagnosis not present

## 2022-01-17 DIAGNOSIS — I6529 Occlusion and stenosis of unspecified carotid artery: Secondary | ICD-10-CM | POA: Diagnosis not present

## 2022-01-17 DIAGNOSIS — M810 Age-related osteoporosis without current pathological fracture: Secondary | ICD-10-CM | POA: Diagnosis not present

## 2022-01-17 DIAGNOSIS — D692 Other nonthrombocytopenic purpura: Secondary | ICD-10-CM | POA: Diagnosis not present

## 2022-01-17 DIAGNOSIS — R252 Cramp and spasm: Secondary | ICD-10-CM | POA: Diagnosis not present

## 2022-01-18 ENCOUNTER — Other Ambulatory Visit: Payer: Self-pay | Admitting: Internal Medicine

## 2022-01-18 DIAGNOSIS — G3184 Mild cognitive impairment, so stated: Secondary | ICD-10-CM

## 2022-01-23 ENCOUNTER — Encounter: Payer: Self-pay | Admitting: Physical Therapy

## 2022-01-23 ENCOUNTER — Ambulatory Visit: Payer: Medicare Other | Admitting: Physical Therapy

## 2022-01-23 ENCOUNTER — Other Ambulatory Visit: Payer: Self-pay | Admitting: Internal Medicine

## 2022-01-23 DIAGNOSIS — M810 Age-related osteoporosis without current pathological fracture: Secondary | ICD-10-CM

## 2022-01-23 DIAGNOSIS — M6281 Muscle weakness (generalized): Secondary | ICD-10-CM

## 2022-01-23 DIAGNOSIS — R2681 Unsteadiness on feet: Secondary | ICD-10-CM

## 2022-01-23 DIAGNOSIS — R2689 Other abnormalities of gait and mobility: Secondary | ICD-10-CM

## 2022-01-29 ENCOUNTER — Ambulatory Visit: Payer: Medicare Other | Admitting: Physical Therapy

## 2022-02-05 ENCOUNTER — Encounter: Payer: Self-pay | Admitting: Physical Therapy

## 2022-02-05 ENCOUNTER — Ambulatory Visit: Payer: Medicare Other | Attending: Internal Medicine | Admitting: Physical Therapy

## 2022-02-05 DIAGNOSIS — M6281 Muscle weakness (generalized): Secondary | ICD-10-CM | POA: Insufficient documentation

## 2022-02-05 DIAGNOSIS — R2681 Unsteadiness on feet: Secondary | ICD-10-CM | POA: Insufficient documentation

## 2022-02-05 DIAGNOSIS — R2689 Other abnormalities of gait and mobility: Secondary | ICD-10-CM | POA: Insufficient documentation

## 2022-02-05 NOTE — Therapy (Signed)
OUTPATIENT PHYSICAL THERAPY NEURO EVALUATION   Patient Name: [name] MRN: [id number] DOB:25-Feb-1941, 81 y.o., female Today's Date: 02/05/2022   PCP: Harvest Forest, MD  REFERRING PROVIDER: Harvest Forest, MD    PT End of Session - 02/05/22 1239     Visit Number 3    Number of Visits 13    Date for PT Re-Evaluation 02/26/22    Authorization Type [employer] Medicare    PT Start Time 1231    PT Stop Time 1311    PT Time Calculation (min) 40 min    Activity Tolerance Patient tolerated treatment well    Behavior During Therapy [facility] for tasks assessed/performed               Past Medical History:  Diagnosis Date   Acute sinus infection 01/15/2011   ANXIETY 10/03/2009   BARRETTS ESOPHAGUS 10/03/2009   CAROTID ARTERY DISEASE 08/31/2009   COLONIC POLYPS, HX OF 08/29/2009   CONSTIPATION, CHRONIC 10/03/2009   DEPRESSION 10/03/2009   DISC DISEASE, CERVICAL 08/29/2009   DISC DISEASE, LUMBAR 08/29/2009   Eustachian tube dysfunction 01/15/2011   GERD 08/29/2009   HYPERLIPIDEMIA 08/29/2009   Impaired glucose tolerance 01/14/2011   LEG PAIN, BILATERAL 08/29/2009   LOW BACK PAIN, CHRONIC 11/22/2009   PERIPHERAL NEUROPATHY 10/03/2009   PERIPHERAL VASCULAR DISEASE 08/29/2009   POSITIVE PPD 10/03/2009   Preventative health care 01/14/2011   Stroke (HCC)    Thyroid nodule 08/09/2011   TIA 11/22/2009   TRANSIENT ISCHEMIC ATTACK, HX OF 08/29/2009   URETHRAL STRICTURE 10/03/2009   VOCAL CORD POLYP, HX OF 10/03/2009   WRIST PAIN, BILATERAL 05/25/2010   Past Surgical History:  Procedure Laterality Date   hx of right ankle fracture     s/p vocal cord polyps  1984   chronic hoarseness   stress test negative  2003,2004,2006   TUBAL LIGATION     tubaligation     uterine prolapse     Patient Active Problem List   Diagnosis Date Noted   Dizziness 08/14/2017   Chronic swimmer's ear of both sides 12/13/2016   Acute left hemiparesis (HCC)    TIA (transient ischemic attack) 06/01/2016   CVA  (cerebral vascular accident) (HCC) 06/01/2016   Transient cerebral ischemia    Pelvic prolapse 11/03/2014   Cystocele 09/29/2014   SUI (stress urinary incontinence, female) 09/29/2014   Complicated migraine 08/22/2014   Multinodular goiter 08/09/2011   Lumbar pain with radiation down right leg 04/11/2011   Rash 04/11/2011   Impaired glucose tolerance 01/14/2011   Preventative health care 01/14/2011   WRIST PAIN, BILATERAL 05/25/2010   TIA 11/22/2009   LOW BACK PAIN, CHRONIC 11/22/2009   ANXIETY 10/03/2009   DEPRESSION 10/03/2009   PERIPHERAL NEUROPATHY 10/03/2009   BARRETTS ESOPHAGUS 10/03/2009   CONSTIPATION, CHRONIC 10/03/2009   URETHRAL STRICTURE 10/03/2009   POSITIVE PPD 10/03/2009   VOCAL CORD POLYP, HX OF 10/03/2009   Carotid artery disease (HCC) 08/31/2009   Hyperlipemia 08/29/2009   PERIPHERAL VASCULAR DISEASE 08/29/2009   GERD 08/29/2009   DISC DISEASE, CERVICAL 08/29/2009   DISC DISEASE, LUMBAR 08/29/2009   LEG PAIN, BILATERAL 08/29/2009   TRANSIENT ISCHEMIC ATTACK, HX OF 08/29/2009   COLONIC POLYPS, HX OF 08/29/2009    ONSET DATE: last 6 months  REFERRING DIAG: R26.89 (ICD-10-CM) - Other abnormalities of gait and mobility   THERAPY DIAG:  Unsteadiness on feet  Other abnormalities of gait and mobility  Muscle weakness (generalized)  Rationale for Evaluation and Treatment Rehabilitation  SUBJECTIVE:                                                                                                                                                                                              SUBJECTIVE STATEMENT:  Nothing new since last time I came here, I'm still stumbling a lot wondering how often and how many times I should do exercises from here. I've found I'm not able to walk a lot. I used to walk almost 2 miles a day, now I can barely make it to the top of my hill   Pt accompanied by: self  PERTINENT HISTORY: anxiety, depression, GERD, HLD, peripheral  neuropathy, peripheral vascular disease, R ankle fx   PAIN:  Are you having pain? No  PRECAUTIONS: Fall  WEIGHT BEARING RESTRICTIONS No  FALLS: Has patient fallen in last 6 months? No  LIVING ENVIRONMENT: Lives with: lives alone Lives in: House/apartment Stairs: Yes: External: 3 steps; on right going up Has following equipment at home: Single point cane, shower chair, and Grab bars  PLOF: Independent  PATIENT GOALS work on balance   TODAY'S TREATMENT 02/05/22  Nustep L4 x6 minutes BLEs/BUEs   Double ascending step taps x10 B min guard no UEs Lateral ascending double step taps 1x10 B  Alternating cross midline cone taps on 4 inch step x10 B  Cross midline double cone taps x10 each LE   Forward step ups 4 inch step UUE support 1x10 B Standing mini squats in front of chair 1x10 Mod cues for form  Progressed to mini squat holds x10 3 second pauses  Forward lunges on BOSU 1x10 B intermittent UE support     -------------------------------------------------------------------  OBJECTIVE:   DIAGNOSTIC FINDINGS: none recent  COGNITION: Overall cognitive status: No family/caregiver present to determine baseline cognitive functioning   SENSATION: Hx of peripheral neuropathy- reports tingling and pain in B feet   POSTURE: rounded shoulders, forward head, and increased thoracic kyphosis  LOWER EXTREMITY ROM:     Active  Right Eval Left Eval  Hip flexion    Hip extension    Hip abduction    Hip adduction    Hip internal rotation    Hip external rotation    Knee flexion    Knee extension    Ankle dorsiflexion 10 14  Ankle plantarflexion    Ankle inversion    Ankle eversion     (Blank rows = not tested)  LOWER EXTREMITY MMT:    MMT (in sitting) Right Eval Left Eval  Hip flexion 4 4  Hip extension    Hip abduction 4+ 4+  Hip adduction  4+ 4+  Hip internal rotation    Hip external rotation    Knee flexion 4 4+  Knee extension 4+ 4  Ankle dorsiflexion 4  4  Ankle plantarflexion 4+ 4+  Ankle inversion    Ankle eversion    (Blank rows = not tested)  GAIT: Gait pattern: step through pattern, decreased step length- Right, decreased step length- Left, and narrow BOS Assistive device utilized: None Level of assistance: Complete Independence       M-CTSIB  Condition 1: Firm Surface, EO 30 Sec, Mild Sway  Condition 2: Firm Surface, EC 30 Sec, Moderate Sway  Condition 3: Foam Surface, EO 30 Sec, Severe Sway  Condition 4: Foam Surface, EC NT Sec,  NT  Sway     PATIENT EDUCATION: Education details: frequency of exercise/HEP and walking program at home, encouraged daily progression of walking program with [facility]  Person educated: Patient Education method: Explanation, Demonstration, Tactile cues, Verbal cues, and Handouts Education comprehension: verbalized understanding   HOME EXERCISE PROGRAM: Access Code: [email or web address] URL: [email or web address] Date: 01/15/2022 Prepared by: [facility] - Outpatient  Rehab - [facility]  Program Notes perform in a corner for safety  Exercises - Romberg Stance with Eyes Closed  - 1 x daily - 5 x weekly - 2 sets - 30 sec hold - Standing on Foam Pad  - 1 x daily - 5 x weekly - 2 sets - 30 sec hold - Heel Toe Raises with Counter Support  - 1 x daily - 5 x weekly - 2 sets - 10 reps - Marching with Resistance  - 1 x daily - 5 x weekly - 2 sets - 10 reps - Standing Toe Taps  - 1 x daily - 5 x weekly - 2 sets - 10 reps  ---------------------------------------------------------------   GOALS: Goals reviewed with patient? Yes  SHORT TERM GOALS: Target date: 02/26/2022  Patient to be independent with initial HEP. Baseline: HEP initiated Goal status: INITIAL    LONG TERM GOALS: Target date: 03/19/2022  Patient to be independent with advanced HEP. Baseline: Not yet initiated  Goal status: INITIAL  Patient to demonstrate B LE strength >/=4+/5.  Baseline: See above Goal status:  INITIAL  Patient to demonstrate alternating reciprocal pattern when ascending and descending stairs with good stability and without handrail.   Baseline: Unable Goal status: INITIAL  Patient to score at least 20/24 on DGI in order to decrease risk of falls.  Baseline: 19/24 Goal status: INITIAL  Patient to demonstrate mild-moderate sway with M-CTSIB condition 3 to improve safety on uneven surfaces. Baseline: severe sway Goal status: INITIAL   ASSESSMENT:  CLINICAL IMPRESSION:  Arneisha arrives today doing well, had some questions about how active she should be at home as well as what frequency she should be exercising at at home. Clarified this for her, then we warmed up on the Nustep before focusing on a mix of strength and balance today. Needed up to [facility] for balance with challenging tasks. Still needed rest breaks PRN throughout session, but remains motivated to improve.    OBJECTIVE IMPAIRMENTS Abnormal gait, decreased balance, decreased endurance, difficulty walking, decreased strength, and pain.   ACTIVITY LIMITATIONS carrying, lifting, bending, standing, squatting, stairs, transfers, and bathing  PARTICIPATION LIMITATIONS: meal prep, cleaning, laundry, driving, shopping, community activity, yard work, and church  PERSONAL FACTORS Age, Fitness, Past/current experiences, Time since onset of injury/illness/exacerbation, and 3+ comorbidities: anxiety, depression, GERD, HLD, peripheral neuropathy, peripheral vascular disease, R ankle fx  are also affecting  patient's functional outcome.   REHAB POTENTIAL: Good  CLINICAL DECISION MAKING: Evolving/moderate complexity  EVALUATION COMPLEXITY: Moderate  PLAN: PT FREQUENCY: 1-2x/week  PT DURATION: 6 weeks  PLANNED INTERVENTIONS: Therapeutic exercises, Therapeutic activity, Neuromuscular re-education, Balance training, Gait training, Patient/Family education, Joint mobilization, Stair training, Vestibular training, Canalith  repositioning, Aquatic Therapy, Dry Needling, Electrical stimulation, Cryotherapy, Moist heat, Taping, Manual therapy, and Re-evaluation  PLAN FOR NEXT SESSION: reassess HEP, progress LE strengthening, balance with EC/foam, higher level dynamic balance activities    Becky Berberian U PT DPT PN2  02/05/2022, 1:12 PM    [facility] at [facility] [address], [address] [place], [place] [address] Phone # [phone] Fax # [phone]

## 2022-02-12 ENCOUNTER — Ambulatory Visit: Payer: Medicare Other | Admitting: Physical Therapy

## 2022-02-12 ENCOUNTER — Encounter: Payer: Self-pay | Admitting: Physical Therapy

## 2022-02-12 DIAGNOSIS — M6281 Muscle weakness (generalized): Secondary | ICD-10-CM

## 2022-02-12 DIAGNOSIS — R2681 Unsteadiness on feet: Secondary | ICD-10-CM

## 2022-02-12 DIAGNOSIS — R2689 Other abnormalities of gait and mobility: Secondary | ICD-10-CM

## 2022-02-12 NOTE — Therapy (Signed)
OUTPATIENT PHYSICAL THERAPY NEURO EVALUATION   Patient Name: [name] MRN: [id number] DOB:12/24/1940, 81 y.o., female Today's Date: 02/12/2022   PCP: Harvest Forest, MD  REFERRING PROVIDER: Harvest Forest, MD    PT End of Session - 02/12/22 1238     Visit Number 4    Number of Visits 13    Date for PT Re-Evaluation 02/26/22    Authorization Type [employer]HC Medicare    PT Start Time 1236    PT Stop Time 1316    PT Time Calculation (min) 40 min    Activity Tolerance Patient tolerated treatment well    Behavior During Therapy [facility] for tasks assessed/performed                Past Medical History:  Diagnosis Date   Acute sinus infection 01/15/2011   ANXIETY 10/03/2009   BARRETTS ESOPHAGUS 10/03/2009   CAROTID ARTERY DISEASE 08/31/2009   COLONIC POLYPS, HX OF 08/29/2009   CONSTIPATION, CHRONIC 10/03/2009   DEPRESSION 10/03/2009   DISC DISEASE, CERVICAL 08/29/2009   DISC DISEASE, LUMBAR 08/29/2009   Eustachian tube dysfunction 01/15/2011   GERD 08/29/2009   HYPERLIPIDEMIA 08/29/2009   Impaired glucose tolerance 01/14/2011   LEG PAIN, BILATERAL 08/29/2009   LOW BACK PAIN, CHRONIC 11/22/2009   PERIPHERAL NEUROPATHY 10/03/2009   PERIPHERAL VASCULAR DISEASE 08/29/2009   POSITIVE PPD 10/03/2009   Preventative health care 01/14/2011   Stroke (HCC)    Thyroid nodule 08/09/2011   TIA 11/22/2009   TRANSIENT ISCHEMIC ATTACK, HX OF 08/29/2009   URETHRAL STRICTURE 10/03/2009   VOCAL CORD POLYP, HX OF 10/03/2009   WRIST PAIN, BILATERAL 05/25/2010   Past Surgical History:  Procedure Laterality Date   hx of right ankle fracture     s/p vocal cord polyps  1984   chronic hoarseness   stress test negative  2003,2004,2006   TUBAL LIGATION     tubaligation     uterine prolapse     Patient Active Problem List   Diagnosis Date Noted   Dizziness 08/14/2017   Chronic swimmer's ear of both sides 12/13/2016   Acute left hemiparesis (HCC)    TIA (transient ischemic attack) 06/01/2016   CVA  (cerebral vascular accident) (HCC) 06/01/2016   Transient cerebral ischemia    Pelvic prolapse 11/03/2014   Cystocele 09/29/2014   SUI (stress urinary incontinence, female) 09/29/2014   Complicated migraine 08/22/2014   Multinodular goiter 08/09/2011   Lumbar pain with radiation down right leg 04/11/2011   Rash 04/11/2011   Impaired glucose tolerance 01/14/2011   Preventative health care 01/14/2011   WRIST PAIN, BILATERAL 05/25/2010   TIA 11/22/2009   LOW BACK PAIN, CHRONIC 11/22/2009   ANXIETY 10/03/2009   DEPRESSION 10/03/2009   PERIPHERAL NEUROPATHY 10/03/2009   BARRETTS ESOPHAGUS 10/03/2009   CONSTIPATION, CHRONIC 10/03/2009   URETHRAL STRICTURE 10/03/2009   POSITIVE PPD 10/03/2009   VOCAL CORD POLYP, HX OF 10/03/2009   Carotid artery disease (HCC) 08/31/2009   Hyperlipemia 08/29/2009   PERIPHERAL VASCULAR DISEASE 08/29/2009   GERD 08/29/2009   DISC DISEASE, CERVICAL 08/29/2009   DISC DISEASE, LUMBAR 08/29/2009   LEG PAIN, BILATERAL 08/29/2009   TRANSIENT ISCHEMIC ATTACK, HX OF 08/29/2009   COLONIC POLYPS, HX OF 08/29/2009    ONSET DATE: last 6 months  REFERRING DIAG: R26.89 (ICD-10-CM) - Other abnormalities of gait and mobility   THERAPY DIAG:  Unsteadiness on feet  Muscle weakness (generalized)  Other abnormalities of gait and mobility  Rationale for Evaluation and Treatment Rehabilitation  SUBJECTIVE:                                                                                                                                                                                              SUBJECTIVE STATEMENT: Nothing new.  "She worked my butt off last visit".  How can I  keep up this strengthening work at home?  Pt accompanied by: self  PERTINENT HISTORY: anxiety, depression, GERD, HLD, peripheral neuropathy, peripheral vascular disease, R ankle fx   PAIN:  Are you having pain? No  PRECAUTIONS: Fall  WEIGHT BEARING RESTRICTIONS No  FALLS: Has  patient fallen in last 6 months? No  LIVING ENVIRONMENT: Lives with: lives alone Lives in: House/apartment Stairs: Yes: External: 3 steps; on right going up Has following equipment at home: Single point cane, shower chair, and Grab bars  PLOF: Independent  PATIENT GOALS work on balance   TODAY'S TREATMENT 717/23  Therapeutic Exercise:  Nustep L4 x 8 minutes BLEs/BUEs, cues for keeping steps/min >80 for improved flexibility and strength.  Answered pt's questions about use of machines at a gym-discussed following up with [employer] to ask about Silver Sneakers and use of machines  Sit<>stand x 10 reps from mat surface, then x 10 reps from 18" chair  Neuro Re-education:  Standing in parallel bars on incline/decline:  head turns/head nods x 10 reps; EC x 15 sec (no UE support) Forward step taps to floor, 2 x 10 rep, marching in place 2 x 10 reps, BUE support  Forward/back walking in parallel bars, 3 reps, 1 UE support  Standing on Airex Beam:  forward>back step and weightshift x 10 reps each side, cues for increased foot clearance.  Tandem walk on Airex beam with step on beam/step out to side with opposite foot, working on lateral hip stability for dynamic balance, with BUE support.  Standing feet apart head steady with EO, then EC on Airex beam, 15 seconds, x 2, with increased forward lean with EC  PATIENT EDUCATION: Education details: HEP addition Person educated: Patient Education method: Explanation, Demonstration, and Handouts Education comprehension: verbalized understanding   Access Code: [email or web address] URL: [email or web address] Date: 02/12/2022-most recent update to HEP Prepared by: [facility] - Outpatient  Rehab - [facility]  Program Notes perform in a corner for safety  Exercises - Romberg Stance with Eyes Closed  - 1 x daily - 5 x weekly - 2 sets - 30 sec hold - Standing on Foam Pad  - 1 x daily - 5 x weekly - 2 sets - 30 sec hold - Heel Toe Raises with  Counter Support  - 1 x  daily - 5 x weekly - 2 sets - 10 reps - Marching with Resistance  - 1 x daily - 5 x weekly - 2 sets - 10 reps - Standing Toe Taps  - 1 x daily - 5 x weekly - 2 sets - 10 reps - Sit to Stand  - 2 x daily - 7 x weekly - 2 sets - 10 reps  -------------------------------------------------------------------  OBJECTIVE:   DIAGNOSTIC FINDINGS: none recent  COGNITION: Overall cognitive status: No family/caregiver present to determine baseline cognitive functioning   SENSATION: Hx of peripheral neuropathy- reports tingling and pain in B feet   POSTURE: rounded shoulders, forward head, and increased thoracic kyphosis  LOWER EXTREMITY ROM:     Active  Right Eval Left Eval  Hip flexion    Hip extension    Hip abduction    Hip adduction    Hip internal rotation    Hip external rotation    Knee flexion    Knee extension    Ankle dorsiflexion 10 14  Ankle plantarflexion    Ankle inversion    Ankle eversion     (Blank rows = not tested)  LOWER EXTREMITY MMT:    MMT (in sitting) Right Eval Left Eval  Hip flexion 4 4  Hip extension    Hip abduction 4+ 4+  Hip adduction 4+ 4+  Hip internal rotation    Hip external rotation    Knee flexion 4 4+  Knee extension 4+ 4  Ankle dorsiflexion 4 4  Ankle plantarflexion 4+ 4+  Ankle inversion    Ankle eversion    (Blank rows = not tested)  GAIT: Gait pattern: step through pattern, decreased step length- Right, decreased step length- Left, and narrow BOS Assistive device utilized: None Level of assistance: Complete Independence       M-CTSIB  Condition 1: Firm Surface, EO 30 Sec, Mild Sway  Condition 2: Firm Surface, EC 30 Sec, Moderate Sway  Condition 3: Foam Surface, EO 30 Sec, Severe Sway  Condition 4: Foam Surface, EC NT Sec,  NT  Sway     PATIENT EDUCATION: Education details: frequency of exercise/HEP and walking program at home, encouraged daily progression of walking program with [facility]   Person educated: Patient Education method: Explanation, Demonstration, Tactile cues, Verbal cues, and Handouts Education comprehension: verbalized understanding   HOME EXERCISE PROGRAM: Access Code: [email or web address] URL: [email or web address] Date: 01/15/2022 Prepared by: [facility] - Outpatient  Rehab - [facility]  Program Notes perform in a corner for safety  Exercises - Romberg Stance with Eyes Closed  - 1 x daily - 5 x weekly - 2 sets - 30 sec hold - Standing on Foam Pad  - 1 x daily - 5 x weekly - 2 sets - 30 sec hold - Heel Toe Raises with Counter Support  - 1 x daily - 5 x weekly - 2 sets - 10 reps - Marching with Resistance  - 1 x daily - 5 x weekly - 2 sets - 10 reps - Standing Toe Taps  - 1 x daily - 5 x weekly - 2 sets - 10 reps  ---------------------------------------------------------------   GOALS: Goals reviewed with patient? Yes  SHORT TERM GOALS: Target date: 03/05/2022  Patient to be independent with initial HEP. Baseline: HEP initiated Goal status: INITIAL    LONG TERM GOALS: Target date: 03/26/2022  Patient to be independent with advanced HEP. Baseline: Not yet initiated  Goal status: INITIAL  Patient to demonstrate B LE strength >/=  4+/5.  Baseline: See above Goal status: INITIAL  Patient to demonstrate alternating reciprocal pattern when ascending and descending stairs with good stability and without handrail.   Baseline: Unable Goal status: INITIAL  Patient to score at least 20/24 on DGI in order to decrease risk of falls.  Baseline: 19/24 Goal status: INITIAL  Patient to demonstrate mild-moderate sway with M-CTSIB condition 3 to improve safety on uneven surfaces. Baseline: severe sway Goal status: INITIAL   ASSESSMENT:  CLINICAL IMPRESSION: Skilled PT session today focused on static and dynamic balance, as well as functional strength.  Pt prefers UE support for balance activities in parallel bars.  With standing on the  cushion surface, and vision removed, she has increased forward flexed posture, needing UE support to regain balance.  She will continue to benefit from skilled PT towards goals.   OBJECTIVE IMPAIRMENTS Abnormal gait, decreased balance, decreased endurance, difficulty walking, decreased strength, and pain.   ACTIVITY LIMITATIONS carrying, lifting, bending, standing, squatting, stairs, transfers, and bathing  PARTICIPATION LIMITATIONS: meal prep, cleaning, laundry, driving, shopping, community activity, yard work, and church  PERSONAL FACTORS Age, Fitness, Past/current experiences, Time since onset of injury/illness/exacerbation, and 3+ comorbidities: anxiety, depression, GERD, HLD, peripheral neuropathy, peripheral vascular disease, R ankle fx  are also affecting patient's functional outcome.   REHAB POTENTIAL: Good  CLINICAL DECISION MAKING: Evolving/moderate complexity  EVALUATION COMPLEXITY: Moderate  PLAN: PT FREQUENCY: 1-2x/week  PT DURATION: 6 weeks  PLANNED INTERVENTIONS: Therapeutic exercises, Therapeutic activity, Neuromuscular re-education, Balance training, Gait training, Patient/Family education, Joint mobilization, Stair training, Vestibular training, Canalith repositioning, Aquatic Therapy, Dry Needling, Electrical stimulation, Cryotherapy, Moist heat, Taping, Manual therapy, and Re-evaluation  PLAN FOR NEXT SESSION: reassess HEP, including update added this visit , progress BLE strengthening, balance with EC/foam, higher level dynamic balance activities    Lonia Blood, PT 02/12/22 5:20 PM Phone: [phone] Fax: [phone]    [facility] at [facility] [address] [place], [place] [address] Phone # [phone] Fax # [phone]

## 2022-02-12 NOTE — Patient Instructions (Signed)
Access Code: [email or web address] URL: [email or web address] Date: 02/12/2022 Prepared by: [facility] - Outpatient  Rehab - [facility]  Program Notes perform in a corner for safety  Exercises - Romberg Stance with Eyes Closed  - 1 x daily - 5 x weekly - 2 sets - 30 sec hold - Standing on Foam Pad  - 1 x daily - 5 x weekly - 2 sets - 30 sec hold - Heel Toe Raises with Counter Support  - 1 x daily - 5 x weekly - 2 sets - 10 reps - Marching with Resistance  - 1 x daily - 5 x weekly - 2 sets - 10 reps - Standing Toe Taps  - 1 x daily - 5 x weekly - 2 sets - 10 reps - Sit to Stand  - 2 x daily - 7 x weekly - 2 sets - 10 reps

## 2022-02-16 NOTE — Therapy (Addendum)
OUTPATIENT PHYSICAL THERAPY NEURO TREATMENT   Patient Name: [name] MRN: [id number] DOB:1941-01-25, 81 y.o., female Today's Date: 02/19/2022   Progress Note Reporting Period 01/15/22 to 02/19/22  See note below for Objective Data and Assessment of Progress/Goals.      PCP: Harvest Forest, MD  REFERRING PROVIDER: Harvest Forest, MD    PT End of Session - 02/19/22 1402     Visit Number 5    Number of Visits 13    Date for PT Re-Evaluation 02/26/22    Authorization Type [employer] Medicare    PT Start Time 1316    PT Stop Time 1400    PT Time Calculation (min) 44 min    Equipment Utilized During Treatment Gait belt    Activity Tolerance Patient tolerated treatment well    Behavior During Therapy WFL for tasks assessed/performed                 Past Medical History:  Diagnosis Date   Acute sinus infection 01/15/2011   ANXIETY 10/03/2009   BARRETTS ESOPHAGUS 10/03/2009   CAROTID ARTERY DISEASE 08/31/2009   COLONIC POLYPS, HX OF 08/29/2009   CONSTIPATION, CHRONIC 10/03/2009   DEPRESSION 10/03/2009   DISC DISEASE, CERVICAL 08/29/2009   DISC DISEASE, LUMBAR 08/29/2009   Eustachian tube dysfunction 01/15/2011   GERD 08/29/2009   HYPERLIPIDEMIA 08/29/2009   Impaired glucose tolerance 01/14/2011   LEG PAIN, BILATERAL 08/29/2009   LOW BACK PAIN, CHRONIC 11/22/2009   PERIPHERAL NEUROPATHY 10/03/2009   PERIPHERAL VASCULAR DISEASE 08/29/2009   POSITIVE PPD 10/03/2009   Preventative health care 01/14/2011   Stroke (HCC)    Thyroid nodule 08/09/2011   TIA 11/22/2009   TRANSIENT ISCHEMIC ATTACK, HX OF 08/29/2009   URETHRAL STRICTURE 10/03/2009   VOCAL CORD POLYP, HX OF 10/03/2009   WRIST PAIN, BILATERAL 05/25/2010   Past Surgical History:  Procedure Laterality Date   hx of right ankle fracture     s/p vocal cord polyps  1984   chronic hoarseness   stress test negative  2003,2004,2006   TUBAL LIGATION     tubaligation     uterine prolapse     Patient Active Problem List    Diagnosis Date Noted   Dizziness 08/14/2017   Chronic swimmer's ear of both sides 12/13/2016   Acute left hemiparesis (HCC)    TIA (transient ischemic attack) 06/01/2016   CVA (cerebral vascular accident) (HCC) 06/01/2016   Transient cerebral ischemia    Pelvic prolapse 11/03/2014   Cystocele 09/29/2014   SUI (stress urinary incontinence, female) 09/29/2014   Complicated migraine 08/22/2014   Multinodular goiter 08/09/2011   Lumbar pain with radiation down right leg 04/11/2011   Rash 04/11/2011   Impaired glucose tolerance 01/14/2011   Preventative health care 01/14/2011   WRIST PAIN, BILATERAL 05/25/2010   TIA 11/22/2009   LOW BACK PAIN, CHRONIC 11/22/2009   ANXIETY 10/03/2009   DEPRESSION 10/03/2009   PERIPHERAL NEUROPATHY 10/03/2009   BARRETTS ESOPHAGUS 10/03/2009   CONSTIPATION, CHRONIC 10/03/2009   URETHRAL STRICTURE 10/03/2009   POSITIVE PPD 10/03/2009   VOCAL CORD POLYP, HX OF 10/03/2009   Carotid artery disease (HCC) 08/31/2009   Hyperlipemia 08/29/2009   PERIPHERAL VASCULAR DISEASE 08/29/2009   GERD 08/29/2009   DISC DISEASE, CERVICAL 08/29/2009   DISC DISEASE, LUMBAR 08/29/2009   LEG PAIN, BILATERAL 08/29/2009   TRANSIENT ISCHEMIC ATTACK, HX OF 08/29/2009   COLONIC POLYPS, HX OF 08/29/2009    ONSET DATE: last 6 months  REFERRING DIAG: R26.89 (ICD-10-CM) -  Other abnormalities of gait and mobility   THERAPY DIAG:  Unsteadiness on feet  Other abnormalities of gait and mobility  Rationale for Evaluation and Treatment Rehabilitation  SUBJECTIVE:                                                                                                                                                                                              SUBJECTIVE STATEMENT: "I don't seem to be getting any stronger. I don't have the stamina to walk like I used to."  Pt accompanied by: self  PERTINENT HISTORY: anxiety, depression, GERD, HLD, peripheral neuropathy, peripheral  vascular disease, R ankle fx   PAIN:  Are you having pain? No  PRECAUTIONS: Fall  PATIENT GOALS work on balance   OBJECTIVE:   TODAY'S TREATMENT: 02/19/22 Activity Comments  nustep L4x6 min at 80 SPM   STS on foam 10x Cues to scoot to edge of seat, push belly button forward upon standing, avoid plop  STS EC 10x Cues to scoot to edge of seat, push belly button forward upon standing, avoid plop  Standing HS curl red TB 10x each  Excessive forward lean  resisted march red TB x20 Good form   alt step ups 2x10 Cueing on maintaining alternating pattern; CGA d/t occasional imbalance   gait + head turns/nods 2x82ft each Cueing for increased movement amplitude; difficulty coordinating VOR  fwd/back stepping + head nods to targets 10x CGA; cueing throughout to coordinate head movement with stepping; no dizziness   walking on heels/toes 2x62ft each Limited amplitude        PATIENT EDUCATION: Education details: encouraged use of gym machines for build aerobic endurance; answered patient's questions  Person educated: Patient Education method: Explanation Education comprehension: verbalized understanding   Access Code: [email or web address] URL: [email or web address] Date: 02/12/2022-most recent update to HEP Prepared by: [facility] - Outpatient  Rehab - [facility]  Program Notes perform in a corner for safety  Exercises - Romberg Stance with Eyes Closed  - 1 x daily - 5 x weekly - 2 sets - 30 sec hold - Standing on Foam Pad  - 1 x daily - 5 x weekly - 2 sets - 30 sec hold - Heel Toe Raises with Counter Support  - 1 x daily - 5 x weekly - 2 sets - 10 reps - Marching with Resistance  - 1 x daily - 5 x weekly - 2 sets - 10 reps - Standing Toe Taps  - 1 x daily - 5 x weekly - 2 sets - 10 reps - Sit to Stand  - 2 x daily - 7 x  weekly - 2 sets - 10 reps  -------------------------------------------------------------------  OBJECTIVE:   DIAGNOSTIC FINDINGS: none  recent  COGNITION: Overall cognitive status: No family/caregiver present to determine baseline cognitive functioning   SENSATION: Hx of peripheral neuropathy- reports tingling and pain in B feet   POSTURE: rounded shoulders, forward head, and increased thoracic kyphosis  LOWER EXTREMITY ROM:     Active  Right Eval Left Eval  Hip flexion    Hip extension    Hip abduction    Hip adduction    Hip internal rotation    Hip external rotation    Knee flexion    Knee extension    Ankle dorsiflexion 10 14  Ankle plantarflexion    Ankle inversion    Ankle eversion     (Blank rows = not tested)  LOWER EXTREMITY MMT:    MMT (in sitting) Right Eval Left Eval  Hip flexion 4 4  Hip extension    Hip abduction 4+ 4+  Hip adduction 4+ 4+  Hip internal rotation    Hip external rotation    Knee flexion 4 4+  Knee extension 4+ 4  Ankle dorsiflexion 4 4  Ankle plantarflexion 4+ 4+  Ankle inversion    Ankle eversion    (Blank rows = not tested)  GAIT: Gait pattern: step through pattern, decreased step length- Right, decreased step length- Left, and narrow BOS Assistive device utilized: None Level of assistance: Complete Independence       M-CTSIB  Condition 1: Firm Surface, EO 30 Sec, Mild Sway  Condition 2: Firm Surface, EC 30 Sec, Moderate Sway  Condition 3: Foam Surface, EO 30 Sec, Severe Sway  Condition 4: Foam Surface, EC NT Sec,  NT  Sway     PATIENT EDUCATION: Education details: frequency of exercise/HEP and walking program at home, encouraged daily progression of walking program with [facility]  Person educated: Patient Education method: Explanation, Demonstration, Tactile cues, Verbal cues, and Handouts Education comprehension: verbalized understanding   HOME EXERCISE PROGRAM: Access Code: [email or web address] URL: [email or web address] Date: 01/15/2022 Prepared by: [facility] - Outpatient  Rehab - [facility]  Program Notes perform in a corner for  safety  Exercises - Romberg Stance with Eyes Closed  - 1 x daily - 5 x weekly - 2 sets - 30 sec hold - Standing on Foam Pad  - 1 x daily - 5 x weekly - 2 sets - 30 sec hold - Heel Toe Raises with Counter Support  - 1 x daily - 5 x weekly - 2 sets - 10 reps - Marching with Resistance  - 1 x daily - 5 x weekly - 2 sets - 10 reps - Standing Toe Taps  - 1 x daily - 5 x weekly - 2 sets - 10 reps  ---------------------------------------------------------------   GOALS: Goals reviewed with patient? Yes  SHORT TERM GOALS: Target date: 02/05/2022  Patient to be independent with initial HEP. Baseline: HEP initiated Goal status: MET    LONG TERM GOALS: Target date: 02/26/2022  Patient to be independent with advanced HEP. Baseline: Not yet initiated  Goal status: IN PROGRESS  Patient to demonstrate B LE strength >/=4+/5.  Baseline: See above Goal status: IN PROGRESS  Patient to demonstrate alternating reciprocal pattern when ascending and descending stairs with good stability and without handrail.   Baseline: Unable Goal status: IN PROGRESS  Patient to score at least 20/24 on DGI in order to decrease risk of falls.  Baseline: 19/24 Goal status: IN PROGRESS  Patient to demonstrate mild-moderate sway with M-CTSIB condition 3 to improve safety on uneven surfaces. Baseline: severe sway Goal status: IN PROGRESS   ASSESSMENT:  CLINICAL IMPRESSION: Patient arrived to session with report of some concerns about limited endurance. Rather than returning to walking in the heat, suggested using gym aerobic equipment to get closer to goal of waking 2 miles. Worked on STS transfers with cueing for transfer set up and appropriate weight shift upon standing. Patient demonstrated improved form after practice. Patient demonstrated difficulty coordinating alternating and dual tasking movements today. Patient reported "I got a good workout" at end of session.    OBJECTIVE IMPAIRMENTS Abnormal gait,  decreased balance, decreased endurance, difficulty walking, decreased strength, and pain.   ACTIVITY LIMITATIONS carrying, lifting, bending, standing, squatting, stairs, transfers, and bathing  PARTICIPATION LIMITATIONS: meal prep, cleaning, laundry, driving, shopping, community activity, yard work, and church  PERSONAL FACTORS Age, Fitness, Past/current experiences, Time since onset of injury/illness/exacerbation, and 3+ comorbidities: anxiety, depression, GERD, HLD, peripheral neuropathy, peripheral vascular disease, R ankle fx  are also affecting patient's functional outcome.   REHAB POTENTIAL: Good  CLINICAL DECISION MAKING: Evolving/moderate complexity  EVALUATION COMPLEXITY: Moderate  PLAN: PT FREQUENCY: 1-2x/week  PT DURATION: 6 weeks  PLANNED INTERVENTIONS: Therapeutic exercises, Therapeutic activity, Neuromuscular re-education, Balance training, Gait training, Patient/Family education, Joint mobilization, Stair training, Vestibular training, Canalith repositioning, Aquatic Therapy, Dry Needling, Electrical stimulation, Cryotherapy, Moist heat, Taping, Manual therapy, and Re-evaluation  PLAN FOR NEXT SESSION: reassess HEP, including update added this visit , progress BLE strengthening, balance with EC/foam, higher level dynamic balance activities    Anette Guarneri, PT, DPT 02/19/22 2:04 PM  [facility] at [facility] [address] [place], [place] [address] Phone # [phone] Fax # [phone]    PHYSICAL THERAPY DISCHARGE SUMMARY  Visits from Start of Care: 5  Current functional level related to goals / functional outcomes: Unable to assess, patient did not return   Remaining deficits: Unable to assess   Education / Equipment: HEP  Plan: Patient agrees to discharge.  Patient goals were not met. Patient is being discharged due to not returning to PT.    Anette Guarneri, PT, DPT 03/19/22 12:20 PM  [facility] at [facility] [address] [place], [address] [place], [place] [address] Phone # [phone] Fax # [phone]

## 2022-02-19 ENCOUNTER — Encounter: Payer: Self-pay | Admitting: Physical Therapy

## 2022-02-19 ENCOUNTER — Ambulatory Visit: Payer: Medicare Other | Admitting: Physical Therapy

## 2022-02-19 DIAGNOSIS — R2681 Unsteadiness on feet: Secondary | ICD-10-CM

## 2022-02-19 DIAGNOSIS — R490 Dysphonia: Secondary | ICD-10-CM | POA: Diagnosis not present

## 2022-02-19 DIAGNOSIS — G3184 Mild cognitive impairment, so stated: Secondary | ICD-10-CM | POA: Diagnosis not present

## 2022-02-19 DIAGNOSIS — R2689 Other abnormalities of gait and mobility: Secondary | ICD-10-CM

## 2022-02-19 DIAGNOSIS — M6281 Muscle weakness (generalized): Secondary | ICD-10-CM | POA: Diagnosis not present

## 2022-02-23 ENCOUNTER — Ambulatory Visit
Admission: RE | Admit: 2022-02-23 | Discharge: 2022-02-23 | Disposition: A | Discharge status: Home / Self Care | Payer: Medicare Other | Source: Ambulatory Visit | Attending: Internal Medicine | Admitting: Internal Medicine

## 2022-02-23 DIAGNOSIS — G3184 Mild cognitive impairment, so stated: Secondary | ICD-10-CM

## 2022-02-23 DIAGNOSIS — I6782 Cerebral ischemia: Secondary | ICD-10-CM | POA: Diagnosis not present

## 2022-02-23 DIAGNOSIS — R2689 Other abnormalities of gait and mobility: Secondary | ICD-10-CM | POA: Diagnosis not present

## 2022-02-23 MED ORDER — GADOBENATE DIMEGLUMINE 529 MG/ML IV SOLN
12.0000 mL | Freq: Once | INTRAVENOUS | Status: AC | PRN
  Administered 2022-02-23: 12 mL via INTRAVENOUS

## 2022-02-26 ENCOUNTER — Ambulatory Visit: Payer: Medicare Other | Admitting: Physical Therapy

## 2022-03-03 ENCOUNTER — Observation Stay (HOSPITAL_COMMUNITY)
Admission: EM | Admit: 2022-03-03 | Discharge: 2022-03-04 | Disposition: A | Discharge status: Home / Self Care | Payer: Medicare Other | Attending: Family Medicine | Admitting: Family Medicine

## 2022-03-03 ENCOUNTER — Encounter (HOSPITAL_COMMUNITY): Payer: Self-pay

## 2022-03-03 ENCOUNTER — Emergency Department (HOSPITAL_COMMUNITY): Payer: Medicare Other

## 2022-03-03 ENCOUNTER — Other Ambulatory Visit: Payer: Self-pay

## 2022-03-03 DIAGNOSIS — Z743 Need for continuous supervision: Secondary | ICD-10-CM | POA: Diagnosis not present

## 2022-03-03 DIAGNOSIS — R6889 Other general symptoms and signs: Secondary | ICD-10-CM | POA: Diagnosis not present

## 2022-03-03 DIAGNOSIS — I2489 Other forms of acute ischemic heart disease: Secondary | ICD-10-CM | POA: Diagnosis present

## 2022-03-03 DIAGNOSIS — R079 Chest pain, unspecified: Secondary | ICD-10-CM | POA: Diagnosis present

## 2022-03-03 DIAGNOSIS — J029 Acute pharyngitis, unspecified: Secondary | ICD-10-CM | POA: Diagnosis not present

## 2022-03-03 DIAGNOSIS — R7989 Other specified abnormal findings of blood chemistry: Secondary | ICD-10-CM | POA: Diagnosis present

## 2022-03-03 DIAGNOSIS — R072 Precordial pain: Secondary | ICD-10-CM | POA: Diagnosis not present

## 2022-03-03 DIAGNOSIS — Z79899 Other long term (current) drug therapy: Secondary | ICD-10-CM | POA: Diagnosis not present

## 2022-03-03 DIAGNOSIS — Z7902 Long term (current) use of antithrombotics/antiplatelets: Secondary | ICD-10-CM | POA: Insufficient documentation

## 2022-03-03 DIAGNOSIS — E876 Hypokalemia: Secondary | ICD-10-CM | POA: Diagnosis not present

## 2022-03-03 DIAGNOSIS — I248 Other forms of acute ischemic heart disease: Secondary | ICD-10-CM | POA: Diagnosis not present

## 2022-03-03 DIAGNOSIS — R778 Other specified abnormalities of plasma proteins: Secondary | ICD-10-CM | POA: Diagnosis not present

## 2022-03-03 DIAGNOSIS — R600 Localized edema: Secondary | ICD-10-CM | POA: Insufficient documentation

## 2022-03-03 DIAGNOSIS — R509 Fever, unspecified: Secondary | ICD-10-CM

## 2022-03-03 DIAGNOSIS — Z8673 Personal history of transient ischemic attack (TIA), and cerebral infarction without residual deficits: Secondary | ICD-10-CM | POA: Diagnosis not present

## 2022-03-03 DIAGNOSIS — R0789 Other chest pain: Secondary | ICD-10-CM | POA: Diagnosis not present

## 2022-03-03 DIAGNOSIS — U071 COVID-19: Secondary | ICD-10-CM | POA: Diagnosis present

## 2022-03-03 LAB — COMPREHENSIVE METABOLIC PANEL
ALT: 27 U/L (ref 0–44)
AST: 33 U/L (ref 15–41)
Albumin: 3.8 g/dL (ref 3.5–5.0)
Alkaline Phosphatase: 44 U/L (ref 38–126)
Anion gap: 7 (ref 5–15)
BUN: 8 mg/dL (ref 8–23)
CO2: 27 mmol/L (ref 22–32)
Calcium: 9.1 mg/dL (ref 8.9–10.3)
Chloride: 106 mmol/L (ref 98–111)
Creatinine, Ser: 0.88 mg/dL (ref 0.44–1.00)
GFR, Estimated: >60 mL/min (ref >60)
Glucose, Bld: 104 mg/dL — ABNORMAL HIGH (ref 70–99)
Potassium: 3.4 mmol/L — ABNORMAL LOW (ref 3.5–5.1)
Sodium: 140 mmol/L (ref 135–145)
Total Bilirubin: 0.9 mg/dL (ref 0.3–1.2)
Total Protein: 6.6 g/dL (ref 6.5–8.1)

## 2022-03-03 LAB — CBC
HCT: 44.4 % (ref 36.0–46.0)
Hemoglobin: 14.5 g/dL (ref 12.0–15.0)
MCH: 31.5 pg (ref 26.0–34.0)
MCHC: 32.7 g/dL (ref 30.0–36.0)
MCV: 96.3 fL (ref 80.0–100.0)
Platelets: 123 10*3/uL — ABNORMAL LOW (ref 150–400)
RBC: 4.61 MIL/uL (ref 3.87–5.11)
RDW: 14.7 % (ref 11.5–15.5)
WBC: 5.1 10*3/uL (ref 4.0–10.5)
nRBC: 0 % (ref 0.0–0.2)

## 2022-03-03 LAB — URINALYSIS, ROUTINE W REFLEX MICROSCOPIC
Bilirubin Urine: NEGATIVE
Glucose, UA: NEGATIVE mg/dL
Hgb urine dipstick: NEGATIVE
Ketones, ur: NEGATIVE mg/dL
Leukocytes,Ua: NEGATIVE
Nitrite: NEGATIVE
Protein, ur: NEGATIVE mg/dL
Specific Gravity, Urine: 1.009 (ref 1.005–1.030)
pH: 7 (ref 5.0–8.0)

## 2022-03-03 LAB — CBG MONITORING, ED: Glucose-Capillary: 97 mg/dL (ref 70–99)

## 2022-03-03 LAB — TROPONIN I (HIGH SENSITIVITY): Troponin I (High Sensitivity): 58 ng/L — ABNORMAL HIGH (ref <18)

## 2022-03-03 MED ORDER — ACETAMINOPHEN 325 MG PO TABS: 650.0000 mg | ORAL_TABLET | Freq: Once | ORAL | Status: DC

## 2022-03-03 NOTE — ED Triage Notes (Signed)
Pt was sitting at table when she had sudden onset of central chest tightness, shaking and cold chills. Pt also c/o SOB. Chest pain was initially 10/10 when ems arrived it was 6/10. After 324asa and nitrox2 chest pain is 2/10. Shortness of breath has resolved as well

## 2022-03-03 NOTE — ED Provider Notes (Signed)
[facility] EMERGENCY DEPARTMENT Provider Note   CSN: [id number] Arrival date & time: 03/03/22  2020     History  Chief Complaint  Patient presents with   Chest Pain   Shortness of Breath    [name] is a 81 y.o. female.  Patient is an 81 year old female with a past medical history of PVD, TIA, hyperlipidemia presenting to the emergency department with chest pain.  Patient states around 1:00 in the afternoon she was doing desk work when she suddenly started to develop chest pressure.  She states she was associated with lightheadedness and dizziness and felt like if she stood up she may pass out.  She denies any shortness of breath but states that she felt like it was hard to take a deep breath.  She states that she had full body chills.  She denies any fevers.  She states that she has had a mild cough recently.  She states that she also has had increased urinary frequency and urgency at night but denies any dysuria or hematuria.  She denies any abdominal pain or back pain.  She states that she took some Tylenol at home to try to relieve the pain herself but had no improvement so she called 911.  In route to the hospital, medics gave her full dose aspirin and nitro with further improvement of her pain.  She does report that she started to develop headache after the nitro.  The history is provided by the patient.  Chest Pain Associated symptoms: shortness of breath   Shortness of Breath Associated symptoms: chest pain        Home Medications Prior to Admission medications   Medication Sig Start Date End Date Taking? Authorizing Provider  acetaminophen (TYLENOL) 325 MG tablet Take 650 mg by mouth every 6 (six) hours as needed for mild pain or headache. Patient not taking: Reported on 01/15/2022    [provider]  alendronate (FOSAMAX) 70 MG tablet Take 70 mg by mouth once a week. 09/30/20   [provider]  clopidogrel (PLAVIX) 75 MG tablet      [provider]  Coenzyme Q10 (CO Q 10 PO)     [provider]  loratadine (CLARITIN) 10 MG tablet     [provider]  magnesium hydroxide (MILK OF MAGNESIA) 400 MG/5ML suspension     [provider]  Multiple Vitamins-Minerals (MULTIVITAMIN WITH MINERALS) tablet Take 1 tablet by mouth daily.    [provider]  Omega-3 Fatty Acids (FISH OIL PO)     [provider]  Omega-3 Fatty Acids (FISH OIL) 1000 MG CAPS Take 1,000 mg by mouth daily.     [provider]  rosuvastatin (CRESTOR) 40 MG tablet Take 1 tablet (40 mg total) by mouth daily. Patient not taking: Reported on 01/15/2022 12/30/19   Lezlie Lye, Meda Coffee, MD      Allergies    Atorvastatin, Penicillin g, and Penicillins    Review of Systems   Review of Systems  Respiratory:  Positive for shortness of breath.   Cardiovascular:  Positive for chest pain.    Physical Exam Updated Vital Signs BP 108/62   Pulse 64   Temp (!) 100.7 F (38.2 C) (Oral)   Resp 18   Ht 5\' 6"  (1.676 m)   Wt 65.8 kg   SpO2 100%   BMI 23.40 kg/m  Physical Exam Vitals and nursing note reviewed.  Constitutional:      General: She  is not in acute distress.    Appearance: She is well-developed.  HENT:     Head: Normocephalic and atraumatic.  Eyes:     Extraocular Movements: Extraocular movements intact.     Pupils: Pupils are equal, round, and reactive to light.  Cardiovascular:     Rate and Rhythm: Normal rate and regular rhythm.     Pulses:          Radial pulses are 2+ on the right side and 2+ on the left side.     Heart sounds: Normal heart sounds.  Pulmonary:     Effort: Pulmonary effort is normal.     Breath sounds: Normal breath sounds.  Abdominal:     General: Bowel sounds are normal.     Palpations: Abdomen is soft.     Comments: No CVA tenderness  Musculoskeletal:        General: Normal range of motion.     Cervical back: Normal range of motion and neck supple.      Right lower leg: No edema.     Left lower leg: No edema.  Skin:    General: Skin is warm and dry.  Neurological:     General: No focal deficit present.     Mental Status: She is alert and oriented to person, place, and time.  Psychiatric:        Mood and Affect: Mood normal.        Behavior: Behavior normal.     ED Results / Procedures / Treatments   Labs (all labs ordered are listed, but only abnormal results are displayed) Labs Reviewed  CBC - Abnormal; Notable for the following components:      Result Value   Platelets 123 (*)    All other components within normal limits  COMPREHENSIVE METABOLIC PANEL - Abnormal; Notable for the following components:   Potassium 3.4 (*)    Glucose, Bld 104 (*)    All other components within normal limits  TROPONIN I (HIGH SENSITIVITY) - Abnormal; Notable for the following components:   Troponin I (High Sensitivity) 58 (*)    All other components within normal limits  URINALYSIS, ROUTINE W REFLEX MICROSCOPIC  CBG MONITORING, ED  TROPONIN I (HIGH SENSITIVITY)    EKG EKG Interpretation  Date/Time:  Saturday March 03 2022 20:24:04 EDT Ventricular Rate:  73 PR Interval:  187 QRS Duration: 96 QT Interval:  394 QTC Calculation: 435 R Axis:   74 Text Interpretation: Sinus rhythm Borderline repolarization abnormality Baseline artifact No significant change since last tracing Confirmed by Arturo Morton ([id number]) on 03/03/2022 8:35:20 PM  Radiology DG Chest 2 View  Result Date: 03/03/2022 CLINICAL DATA:  Sore throat, chest pain, fever EXAM: CHEST - 2 VIEW COMPARISON:  03/24/2021 FINDINGS: Frontal and lateral views of the chest demonstrate an unremarkable cardiac silhouette. No acute airspace disease, effusion, or pneumothorax. No acute bony abnormalities. IMPRESSION: 1. No acute intrathoracic process. Electronically Signed   By: Sharlet Salina M.D.   On: 03/03/2022 21:39    Procedures Procedures    Medications Ordered in ED Medications   acetaminophen (TYLENOL) tablet 650 mg (has no administration in time range)    ED Course/ Medical Decision Making/ A&P Clinical Course as of 03/03/22 2314  Sat Mar 03, 2022  2313 Labs reviewed and interpreted by myself show mildly elevated troponin of 58.  Initial EKG showed questionable ST depressions in the 5 and V6 that appear to be resolved on repeat EKG.  The patient's urine  is negative for UTI.  Chest x-ray shows no signs of pneumonia.  Due to patient's concerning story and high heart score, she will be admitted for troponin trending.  She will additionally have COVID testing performed to evaluate for source of her fever. [VK]    Clinical Course User Index [VK] Phoebe Sharps, DO                           Medical Decision Making Patient is an 81 year old female with a past medical history of PVD, TIA, hyperlipidemia presenting to the emergency department with chest pain.  Due to patient's story and significant cardiac risk factors, she will have work-up performed to evaluate for ACS as a possible cause of her chest pain.  She will additionally be evaluated for pneumonia in the setting of her fever, pleural effusion pulmonary edema as causes of her chest pain.  She is not appear fluid overloaded on exam.  She will additionally be evaluated for UTI in the setting of her fever.  Be given Tylenol for her fever.  She states that her chest pain is significantly improved at this time and blood pressure is soft in the 100 systolic and will not require any further nitroglycerin.  Amount and/or Complexity of Data Reviewed Labs: ordered. Decision-making details documented in ED Course. Radiology: ordered. Decision-making details documented in ED Course. ECG/medicine tests: ordered. Decision-making details documented in ED Course.  Risk OTC drugs. Decision regarding hospitalization.           Final Clinical Impression(s) / ED Diagnoses Final diagnoses:  None    Rx / DC  Orders ED Discharge Orders     None         Phoebe Sharps, DO 03/03/22 2340

## 2022-03-04 ENCOUNTER — Observation Stay (HOSPITAL_BASED_OUTPATIENT_CLINIC_OR_DEPARTMENT_OTHER): Payer: Medicare Other

## 2022-03-04 ENCOUNTER — Observation Stay (HOSPITAL_COMMUNITY): Payer: Medicare Other

## 2022-03-04 ENCOUNTER — Encounter (HOSPITAL_COMMUNITY): Payer: Self-pay | Admitting: Internal Medicine

## 2022-03-04 DIAGNOSIS — I248 Other forms of acute ischemic heart disease: Secondary | ICD-10-CM | POA: Diagnosis present

## 2022-03-04 DIAGNOSIS — R778 Other specified abnormalities of plasma proteins: Secondary | ICD-10-CM | POA: Diagnosis present

## 2022-03-04 DIAGNOSIS — U071 COVID-19: Secondary | ICD-10-CM | POA: Diagnosis not present

## 2022-03-04 DIAGNOSIS — R911 Solitary pulmonary nodule: Secondary | ICD-10-CM | POA: Diagnosis not present

## 2022-03-04 DIAGNOSIS — R079 Chest pain, unspecified: Secondary | ICD-10-CM | POA: Diagnosis present

## 2022-03-04 DIAGNOSIS — I2489 Other forms of acute ischemic heart disease: Secondary | ICD-10-CM | POA: Diagnosis present

## 2022-03-04 LAB — SARS CORONAVIRUS 2 BY RT PCR: SARS Coronavirus 2 by RT PCR: POSITIVE — AB

## 2022-03-04 LAB — ECHOCARDIOGRAM COMPLETE
AR max vel: 3.07 cm2
AV Area VTI: 3.02 cm2
AV Area mean vel: 2.98 cm2
AV Mean grad: 3 mmHg
AV Peak grad: 6.1 mmHg
Ao pk vel: 1.23 m/s
Area-P 1/2: 3.15 cm2
Height: 66 in
S' Lateral: 2.7 cm
Weight: 2262.4 oz

## 2022-03-04 LAB — TROPONIN I (HIGH SENSITIVITY)
Troponin I (High Sensitivity): 103 ng/L (ref <18)
Troponin I (High Sensitivity): 141 ng/L (ref <18)
Troponin I (High Sensitivity): 89 ng/L — ABNORMAL HIGH (ref <18)

## 2022-03-04 MED ORDER — IOHEXOL 350 MG/ML SOLN
75.0000 mL | Freq: Once | INTRAVENOUS | Status: AC | PRN
  Administered 2022-03-04: 75 mL via INTRAVENOUS

## 2022-03-04 MED ORDER — MOLNUPIRAVIR EUA 200MG CAPSULE: 4.0000 | ORAL_CAPSULE | Freq: Two times a day (BID) | ORAL | 0 refills | Status: AC

## 2022-03-04 MED ORDER — ASPIRIN 81 MG PO TBEC
81.0000 mg | DELAYED_RELEASE_TABLET | Freq: Every day | ORAL | Status: DC
  Administered 2022-03-04: 81 mg via ORAL
  Filled 2022-03-04: qty 1

## 2022-03-04 MED ORDER — MOLNUPIRAVIR EUA 200MG CAPSULE
4.0000 | ORAL_CAPSULE | Freq: Two times a day (BID) | ORAL | Status: DC
  Administered 2022-03-04 (×2): 800 mg via ORAL
  Filled 2022-03-04: qty 4

## 2022-03-04 MED ORDER — CLOPIDOGREL BISULFATE 75 MG PO TABS
75.0000 mg | ORAL_TABLET | Freq: Every day | ORAL | Status: DC
  Administered 2022-03-04: 75 mg via ORAL
  Filled 2022-03-04: qty 1

## 2022-03-04 MED ORDER — ENOXAPARIN SODIUM 40 MG/0.4ML IJ SOSY
40.0000 mg | PREFILLED_SYRINGE | INTRAMUSCULAR | Status: DC
  Administered 2022-03-04: 40 mg via SUBCUTANEOUS
  Filled 2022-03-04: qty 0.4

## 2022-03-04 MED ORDER — ROSUVASTATIN CALCIUM 20 MG PO TABS
40.0000 mg | ORAL_TABLET | Freq: Every day | ORAL | Status: DC
  Administered 2022-03-04: 40 mg via ORAL
  Filled 2022-03-04: qty 2

## 2022-03-04 MED ORDER — ACETAMINOPHEN 325 MG PO TABS
650.0000 mg | ORAL_TABLET | Freq: Four times a day (QID) | ORAL | Status: DC | PRN
  Administered 2022-03-04 (×2): 650 mg via ORAL
  Filled 2022-03-04 (×2): qty 2

## 2022-03-04 MED ORDER — GUAIFENESIN 100 MG/5ML PO LIQD
5.0000 mL | ORAL | Status: DC | PRN
  Administered 2022-03-04 (×3): 5 mL via ORAL
  Filled 2022-03-04 (×3): qty 10

## 2022-03-04 MED ORDER — POTASSIUM CHLORIDE CRYS ER 20 MEQ PO TBCR
20.0000 meq | EXTENDED_RELEASE_TABLET | Freq: Once | ORAL | Status: AC
  Administered 2022-03-04: 20 meq via ORAL
  Filled 2022-03-04: qty 1

## 2022-03-04 NOTE — ED Notes (Signed)
CRIT lab trop 103 reported to [facility]

## 2022-03-04 NOTE — Plan of Care (Signed)

## 2022-03-04 NOTE — Discharge Summary (Signed)
PatientPhysician Discharge Summary  [name] JXB:[id number] DOB: 1941/02/10 DOA: 03/03/2022  PCP: Harvest Forest, MD  Admit date: 03/03/2022 Discharge date: 03/04/2022    Admitted From: Home Disposition: Home  Recommendations for Outpatient Follow-up:  Follow up with PCP in 1-2 weeks Please obtain BMP/CBC in one week Please follow up with your PCP on the following pending results: Unresulted Labs (From admission, onward)     Start     Ordered   03/11/22 0500  Creatinine, serum  (enoxaparin (LOVENOX)    CrCl >/= 30 ml/min)  Weekly,   R     Comments: while on enoxaparin therapy    03/04/22 0253   03/05/22 0500  CBC with Differential/Platelet  Daily at 5am,   R      03/04/22 0253   03/05/22 0500  Comprehensive metabolic panel  Daily at 5am,   R      03/04/22 0253   03/05/22 0500  C-reactive protein  Daily at 5am,   R      03/04/22 0253   03/05/22 0500  D-dimer, quantitative  Daily at 5am,   R      03/04/22 0253              Home Health: None Equipment/Devices: None  Discharge Condition: Stable CODE STATUS: Full code Diet recommendation: Cardiac  Subjective: Seen and examined.  Daughter at the bedside.  Nurse at the bedside as well.  Patient has no complaints.  She said her chest pain has completely resolved and she never had any shortness of breath, the only complaint she has is cough.  Discussed with the family, they are comfortable going home.  HPI: [name] is a 81 y.o. female with history of hyperlipidemia TIA presents to the ER with complaint of chest pain.  Patient states she has been having chest pain substernal nonradiating pressure-like present even at rest for the last 24 hours.  Over the last 2 days patient has been having some nonproductive cough.  Denies any nausea vomiting abdominal pain or shortness of breath.  Patient states over the last couple of weeks patient has been noticing increasing swelling in the right lower extremity.   Recently traveled to [place].   ED Course: In the ER patient EKG shows normal sinus rhythm chest x-ray unremarkable.  High sensitive troponin was 58 and 103.  Patient's COVID test came back positive.  Brief/Interim Summary: Patient was admitted due to chest pain.  Troponins were checked, 58> 103> 141> 89.  However patient has no more chest pain and no shortness of breath.  She was tested positive for COVID-19 but she is not hypoxic and chest x-ray also unremarkable.  She has no leukocytosis and she is afebrile.  Transthoracic echo was checked which shows normal ejection fraction with no wall motion abnormality but grade 1 diastolic dysfunction.  Her elevated troponin is likely secondary to demand ischemia in the setting of COVID-19.  For COVID-19, she is not hypoxic either.   She had mild hypokalemia which was replaced.  D-dimer was ordered but that is also pending.  Patient has 0 score on the Wells criteria.  PE not suspected.  Patient symptoms are completely resolved.  She is being discharged in stable condition.  I will prescribe her molnupiravir .  Discharge plan was discussed with patient and/or family member and they verbalized understanding and agreed with it.  Discharge Diagnoses:  Principal Problem:   Chest pain Active Problems:   COVID-19 virus infection  PatientPhysician Discharge Summary  [name] JXB:[id number] DOB: 1941/02/10 DOA: 03/03/2022  PCP: Harvest Forest, MD  Admit date: 03/03/2022 Discharge date: 03/04/2022    Admitted From: Home Disposition: Home  Recommendations for Outpatient Follow-up:  Follow up with PCP in 1-2 weeks Please obtain BMP/CBC in one week Please follow up with your PCP on the following pending results: Unresulted Labs (From admission, onward)     Start     Ordered   03/11/22 0500  Creatinine, serum  (enoxaparin (LOVENOX)    CrCl >/= 30 ml/min)  Weekly,   R     Comments: while on enoxaparin therapy    03/04/22 0253   03/05/22 0500  CBC with Differential/Platelet  Daily at 5am,   R      03/04/22 0253   03/05/22 0500  Comprehensive metabolic panel  Daily at 5am,   R      03/04/22 0253   03/05/22 0500  C-reactive protein  Daily at 5am,   R      03/04/22 0253   03/05/22 0500  D-dimer, quantitative  Daily at 5am,   R      03/04/22 0253              Home Health: None Equipment/Devices: None  Discharge Condition: Stable CODE STATUS: Full code Diet recommendation: Cardiac  Subjective: Seen and examined.  Daughter at the bedside.  Nurse at the bedside as well.  Patient has no complaints.  She said her chest pain has completely resolved and she never had any shortness of breath, the only complaint she has is cough.  Discussed with the family, they are comfortable going home.  HPI: [name] is a 81 y.o. female with history of hyperlipidemia TIA presents to the ER with complaint of chest pain.  Patient states she has been having chest pain substernal nonradiating pressure-like present even at rest for the last 24 hours.  Over the last 2 days patient has been having some nonproductive cough.  Denies any nausea vomiting abdominal pain or shortness of breath.  Patient states over the last couple of weeks patient has been noticing increasing swelling in the right lower extremity.   Recently traveled to [place].   ED Course: In the ER patient EKG shows normal sinus rhythm chest x-ray unremarkable.  High sensitive troponin was 58 and 103.  Patient's COVID test came back positive.  Brief/Interim Summary: Patient was admitted due to chest pain.  Troponins were checked, 58> 103> 141> 89.  However patient has no more chest pain and no shortness of breath.  She was tested positive for COVID-19 but she is not hypoxic and chest x-ray also unremarkable.  She has no leukocytosis and she is afebrile.  Transthoracic echo was checked which shows normal ejection fraction with no wall motion abnormality but grade 1 diastolic dysfunction.  Her elevated troponin is likely secondary to demand ischemia in the setting of COVID-19.  For COVID-19, she is not hypoxic either.   She had mild hypokalemia which was replaced.  D-dimer was ordered but that is also pending.  Patient has 0 score on the Wells criteria.  PE not suspected.  Patient symptoms are completely resolved.  She is being discharged in stable condition.  I will prescribe her molnupiravir .  Discharge plan was discussed with patient and/or family member and they verbalized understanding and agreed with it.  Discharge Diagnoses:  Principal Problem:   Chest pain Active Problems:   COVID-19 virus infection  PatientPhysician Discharge Summary  [name] JXB:[id number] DOB: 1941/02/10 DOA: 03/03/2022  PCP: Harvest Forest, MD  Admit date: 03/03/2022 Discharge date: 03/04/2022    Admitted From: Home Disposition: Home  Recommendations for Outpatient Follow-up:  Follow up with PCP in 1-2 weeks Please obtain BMP/CBC in one week Please follow up with your PCP on the following pending results: Unresulted Labs (From admission, onward)     Start     Ordered   03/11/22 0500  Creatinine, serum  (enoxaparin (LOVENOX)    CrCl >/= 30 ml/min)  Weekly,   R     Comments: while on enoxaparin therapy    03/04/22 0253   03/05/22 0500  CBC with Differential/Platelet  Daily at 5am,   R      03/04/22 0253   03/05/22 0500  Comprehensive metabolic panel  Daily at 5am,   R      03/04/22 0253   03/05/22 0500  C-reactive protein  Daily at 5am,   R      03/04/22 0253   03/05/22 0500  D-dimer, quantitative  Daily at 5am,   R      03/04/22 0253              Home Health: None Equipment/Devices: None  Discharge Condition: Stable CODE STATUS: Full code Diet recommendation: Cardiac  Subjective: Seen and examined.  Daughter at the bedside.  Nurse at the bedside as well.  Patient has no complaints.  She said her chest pain has completely resolved and she never had any shortness of breath, the only complaint she has is cough.  Discussed with the family, they are comfortable going home.  HPI: [name] is a 81 y.o. female with history of hyperlipidemia TIA presents to the ER with complaint of chest pain.  Patient states she has been having chest pain substernal nonradiating pressure-like present even at rest for the last 24 hours.  Over the last 2 days patient has been having some nonproductive cough.  Denies any nausea vomiting abdominal pain or shortness of breath.  Patient states over the last couple of weeks patient has been noticing increasing swelling in the right lower extremity.   Recently traveled to [place].   ED Course: In the ER patient EKG shows normal sinus rhythm chest x-ray unremarkable.  High sensitive troponin was 58 and 103.  Patient's COVID test came back positive.  Brief/Interim Summary: Patient was admitted due to chest pain.  Troponins were checked, 58> 103> 141> 89.  However patient has no more chest pain and no shortness of breath.  She was tested positive for COVID-19 but she is not hypoxic and chest x-ray also unremarkable.  She has no leukocytosis and she is afebrile.  Transthoracic echo was checked which shows normal ejection fraction with no wall motion abnormality but grade 1 diastolic dysfunction.  Her elevated troponin is likely secondary to demand ischemia in the setting of COVID-19.  For COVID-19, she is not hypoxic either.   She had mild hypokalemia which was replaced.  D-dimer was ordered but that is also pending.  Patient has 0 score on the Wells criteria.  PE not suspected.  Patient symptoms are completely resolved.  She is being discharged in stable condition.  I will prescribe her molnupiravir .  Discharge plan was discussed with patient and/or family member and they verbalized understanding and agreed with it.  Discharge Diagnoses:  Principal Problem:   Chest pain Active Problems:   COVID-19 virus infection  PatientPhysician Discharge Summary  [name] JXB:[id number] DOB: 1941/02/10 DOA: 03/03/2022  PCP: Harvest Forest, MD  Admit date: 03/03/2022 Discharge date: 03/04/2022    Admitted From: Home Disposition: Home  Recommendations for Outpatient Follow-up:  Follow up with PCP in 1-2 weeks Please obtain BMP/CBC in one week Please follow up with your PCP on the following pending results: Unresulted Labs (From admission, onward)     Start     Ordered   03/11/22 0500  Creatinine, serum  (enoxaparin (LOVENOX)    CrCl >/= 30 ml/min)  Weekly,   R     Comments: while on enoxaparin therapy    03/04/22 0253   03/05/22 0500  CBC with Differential/Platelet  Daily at 5am,   R      03/04/22 0253   03/05/22 0500  Comprehensive metabolic panel  Daily at 5am,   R      03/04/22 0253   03/05/22 0500  C-reactive protein  Daily at 5am,   R      03/04/22 0253   03/05/22 0500  D-dimer, quantitative  Daily at 5am,   R      03/04/22 0253              Home Health: None Equipment/Devices: None  Discharge Condition: Stable CODE STATUS: Full code Diet recommendation: Cardiac  Subjective: Seen and examined.  Daughter at the bedside.  Nurse at the bedside as well.  Patient has no complaints.  She said her chest pain has completely resolved and she never had any shortness of breath, the only complaint she has is cough.  Discussed with the family, they are comfortable going home.  HPI: [name] is a 81 y.o. female with history of hyperlipidemia TIA presents to the ER with complaint of chest pain.  Patient states she has been having chest pain substernal nonradiating pressure-like present even at rest for the last 24 hours.  Over the last 2 days patient has been having some nonproductive cough.  Denies any nausea vomiting abdominal pain or shortness of breath.  Patient states over the last couple of weeks patient has been noticing increasing swelling in the right lower extremity.   Recently traveled to [place].   ED Course: In the ER patient EKG shows normal sinus rhythm chest x-ray unremarkable.  High sensitive troponin was 58 and 103.  Patient's COVID test came back positive.  Brief/Interim Summary: Patient was admitted due to chest pain.  Troponins were checked, 58> 103> 141> 89.  However patient has no more chest pain and no shortness of breath.  She was tested positive for COVID-19 but she is not hypoxic and chest x-ray also unremarkable.  She has no leukocytosis and she is afebrile.  Transthoracic echo was checked which shows normal ejection fraction with no wall motion abnormality but grade 1 diastolic dysfunction.  Her elevated troponin is likely secondary to demand ischemia in the setting of COVID-19.  For COVID-19, she is not hypoxic either.   She had mild hypokalemia which was replaced.  D-dimer was ordered but that is also pending.  Patient has 0 score on the Wells criteria.  PE not suspected.  Patient symptoms are completely resolved.  She is being discharged in stable condition.  I will prescribe her molnupiravir .  Discharge plan was discussed with patient and/or family member and they verbalized understanding and agreed with it.  Discharge Diagnoses:  Principal Problem:   Chest pain Active Problems:   COVID-19 virus infection  Elevated troponin   Demand ischemia [facility])    Discharge Instructions   Allergies as of 03/04/2022       Reactions   Atorvastatin    REACTION: myalgia   Penicillin G    Other reaction(s): swelling   Penicillins Hives, Itching   Has patient had a PCN reaction causing immediate rash, facial/tongue/throat swelling, SOB or lightheadedness with hypotension: no Has patient had a PCN reaction causing severe rash involving mucus membranes or skin necrosis: no Has patient had a PCN reaction that required hospitalization : no Has patient had a PCN reaction occurring within the last 10 years: no If all of the above answers are "NO",  then may proceed with Cephalosporin use.        Medication List     STOP taking these medications    acetaminophen 325 MG tablet Commonly known as: TYLENOL   loratadine 10 MG tablet Commonly known as: CLARITIN   Milk of Magnesia 400 MG/5ML suspension Generic drug: magnesium hydroxide   rosuvastatin 40 MG tablet Commonly known as: CRESTOR       TAKE these medications    alendronate 70 MG tablet Commonly known as: FOSAMAX Take 70 mg by mouth once a week.   clopidogrel 75 MG tablet Commonly known as: PLAVIX   CO Q 10 PO   Fish Oil 1000 MG Caps Take 1,000 mg by mouth daily. What changed: Another medication with the same name was removed. Continue taking this medication, and follow the directions you see here.   molnupiravir EUA 200 mg Caps capsule Commonly known as: LAGEVRIO Take 4 capsules (800 mg total) by mouth 2 (two) times daily for 5 days.   multivitamin with minerals tablet Take 1 tablet by mouth daily.        Follow-up Information     Bakare, Mobolaji B, MD Follow up in 1 week(s).   Specialty: Internal Medicine Contact information: [address] [place] [address] [phone]                Allergies  Allergen Reactions   Atorvastatin     REACTION: myalgia   Penicillin G     Other reaction(s): swelling   Penicillins Hives and Itching    Has patient had a PCN reaction causing immediate rash, facial/tongue/throat swelling, SOB or lightheadedness with hypotension: no Has patient had a PCN reaction causing severe rash involving mucus membranes or skin necrosis: no Has patient had a PCN reaction that required hospitalization : no Has patient had a PCN reaction occurring within the last 10 years: no If all of the above answers are "NO", then may proceed with Cephalosporin use.     Consultations: None   Procedures/Studies: ECHOCARDIOGRAM COMPLETE  Result Date: 03/04/2022    ECHOCARDIOGRAM REPORT   Patient Name:    [name] Date of Exam: 03/04/2022 Medical Rec #:  [id number]            Height:       66.0 in Accession #:    [id number]           Weight:       141.4 lb Date of Birth:  Jun 14, 1941           BSA:          1.726 m Patient Age:    80 years             BP:           127/68 mmHg  PatientPhysician Discharge Summary  [name] JXB:[id number] DOB: 1941/02/10 DOA: 03/03/2022  PCP: Harvest Forest, MD  Admit date: 03/03/2022 Discharge date: 03/04/2022    Admitted From: Home Disposition: Home  Recommendations for Outpatient Follow-up:  Follow up with PCP in 1-2 weeks Please obtain BMP/CBC in one week Please follow up with your PCP on the following pending results: Unresulted Labs (From admission, onward)     Start     Ordered   03/11/22 0500  Creatinine, serum  (enoxaparin (LOVENOX)    CrCl >/= 30 ml/min)  Weekly,   R     Comments: while on enoxaparin therapy    03/04/22 0253   03/05/22 0500  CBC with Differential/Platelet  Daily at 5am,   R      03/04/22 0253   03/05/22 0500  Comprehensive metabolic panel  Daily at 5am,   R      03/04/22 0253   03/05/22 0500  C-reactive protein  Daily at 5am,   R      03/04/22 0253   03/05/22 0500  D-dimer, quantitative  Daily at 5am,   R      03/04/22 0253              Home Health: None Equipment/Devices: None  Discharge Condition: Stable CODE STATUS: Full code Diet recommendation: Cardiac  Subjective: Seen and examined.  Daughter at the bedside.  Nurse at the bedside as well.  Patient has no complaints.  She said her chest pain has completely resolved and she never had any shortness of breath, the only complaint she has is cough.  Discussed with the family, they are comfortable going home.  HPI: [name] is a 81 y.o. female with history of hyperlipidemia TIA presents to the ER with complaint of chest pain.  Patient states she has been having chest pain substernal nonradiating pressure-like present even at rest for the last 24 hours.  Over the last 2 days patient has been having some nonproductive cough.  Denies any nausea vomiting abdominal pain or shortness of breath.  Patient states over the last couple of weeks patient has been noticing increasing swelling in the right lower extremity.   Recently traveled to [place].   ED Course: In the ER patient EKG shows normal sinus rhythm chest x-ray unremarkable.  High sensitive troponin was 58 and 103.  Patient's COVID test came back positive.  Brief/Interim Summary: Patient was admitted due to chest pain.  Troponins were checked, 58> 103> 141> 89.  However patient has no more chest pain and no shortness of breath.  She was tested positive for COVID-19 but she is not hypoxic and chest x-ray also unremarkable.  She has no leukocytosis and she is afebrile.  Transthoracic echo was checked which shows normal ejection fraction with no wall motion abnormality but grade 1 diastolic dysfunction.  Her elevated troponin is likely secondary to demand ischemia in the setting of COVID-19.  For COVID-19, she is not hypoxic either.   She had mild hypokalemia which was replaced.  D-dimer was ordered but that is also pending.  Patient has 0 score on the Wells criteria.  PE not suspected.  Patient symptoms are completely resolved.  She is being discharged in stable condition.  I will prescribe her molnupiravir .  Discharge plan was discussed with patient and/or family member and they verbalized understanding and agreed with it.  Discharge Diagnoses:  Principal Problem:   Chest pain Active Problems:   COVID-19 virus infection  Area:     2.84 cm  RIGHT VENTRICLE             IVC RV Basal diam:  3.00 cm     IVC diam: 1.50 cm RV S prime:     13.50 cm/s TAPSE (M-mode): 2.5 cm LEFT ATRIUM             Index        RIGHT ATRIUM           Index LA diam:        2.70 cm 1.56 cm/m   RA Area:     10.80 cm LA Vol (A2C):   43.6 ml 25.26 ml/m  RA Volume:   21.20 ml  12.28 ml/m LA Vol (A4C):   29.7 ml 17.21 ml/m LA Biplane Vol: 36.0 ml 20.86 ml/m  AORTIC VALVE AV Area (Vmax):    3.07 cm AV Area (Vmean):    2.98 cm AV Area (VTI):     3.02 cm AV Vmax:           123.00 cm/s AV Vmean:          84.800 cm/s AV VTI:            0.241 m AV Peak Grad:      6.1 mmHg AV Mean Grad:      3.0 mmHg LVOT Vmax:         133.00 cm/s LVOT Vmean:        89.100 cm/s LVOT VTI:          0.257 m LVOT/AV VTI ratio: 1.07  AORTA Ao Root diam: 3.50 cm Ao Asc diam:  3.60 cm MITRAL VALVE MV Area (PHT): 3.15 cm    SHUNTS MV Decel Time: 241 msec    Systemic VTI:  0.26 m MV E velocity: 78.10 cm/s  Systemic Diam: 1.90 cm MV A velocity: 73.90 cm/s MV E/A ratio:  1.06 Olga MillersBrian Crenshaw MD Electronically signed by Olga MillersBrian Crenshaw MD Signature Date/Time: 03/04/2022/1:48:27 PM    Final    VAS US LOWER EXTREMITY VENOUS (DVT)  Result Date: 03/04/2022  Lower Venous DVT Study Patient Name:  Sonya HugerMARY LOUISE Peck  Date of Exam:   03/04/2022 Medical Rec #: [id number]             Accession #:    [id number] Date of Birth: Jul 11, 1941            Patient Gender: F Patient Age:   [age] years [facility] Procedure:      VAS US LOWER EXTREMITY VENOUS (DVT) Referring Phys: Midge MiniumARSHAD KAKRAKANDY --------------------------------------------------------------------------------  Indications: Covid.  Comparison Study: Prior negative bilateral LEV done 08/31/21 Performing Technologist: Sherren Kernsandace Kanady RVS  Examination Guidelines: A complete evaluation includes B-mode imaging, spectral Doppler, color Doppler, and power Doppler as needed of all accessible portions of each vessel. Bilateral testing is considered an integral part of a complete examination. Limited examinations for reoccurring indications may be performed as noted. The reflux portion of the exam is performed with the patient in reverse Trendelenburg.  +---------+---------------+---------+-----------+----------+--------------+ RIGHT    CompressibilityPhasicitySpontaneityPropertiesThrombus Aging +---------+---------------+---------+-----------+----------+--------------+ CFV      Full           Yes       Yes                                 +---------+---------------+---------+-----------+----------+--------------+ SFJ      Full                                                        +---------+---------------+---------+-----------+----------+--------------+  PatientPhysician Discharge Summary  [name] JXB:[id number] DOB: 1941/02/10 DOA: 03/03/2022  PCP: Harvest Forest, MD  Admit date: 03/03/2022 Discharge date: 03/04/2022    Admitted From: Home Disposition: Home  Recommendations for Outpatient Follow-up:  Follow up with PCP in 1-2 weeks Please obtain BMP/CBC in one week Please follow up with your PCP on the following pending results: Unresulted Labs (From admission, onward)     Start     Ordered   03/11/22 0500  Creatinine, serum  (enoxaparin (LOVENOX)    CrCl >/= 30 ml/min)  Weekly,   R     Comments: while on enoxaparin therapy    03/04/22 0253   03/05/22 0500  CBC with Differential/Platelet  Daily at 5am,   R      03/04/22 0253   03/05/22 0500  Comprehensive metabolic panel  Daily at 5am,   R      03/04/22 0253   03/05/22 0500  C-reactive protein  Daily at 5am,   R      03/04/22 0253   03/05/22 0500  D-dimer, quantitative  Daily at 5am,   R      03/04/22 0253              Home Health: None Equipment/Devices: None  Discharge Condition: Stable CODE STATUS: Full code Diet recommendation: Cardiac  Subjective: Seen and examined.  Daughter at the bedside.  Nurse at the bedside as well.  Patient has no complaints.  She said her chest pain has completely resolved and she never had any shortness of breath, the only complaint she has is cough.  Discussed with the family, they are comfortable going home.  HPI: [name] is a 81 y.o. female with history of hyperlipidemia TIA presents to the ER with complaint of chest pain.  Patient states she has been having chest pain substernal nonradiating pressure-like present even at rest for the last 24 hours.  Over the last 2 days patient has been having some nonproductive cough.  Denies any nausea vomiting abdominal pain or shortness of breath.  Patient states over the last couple of weeks patient has been noticing increasing swelling in the right lower extremity.   Recently traveled to [place].   ED Course: In the ER patient EKG shows normal sinus rhythm chest x-ray unremarkable.  High sensitive troponin was 58 and 103.  Patient's COVID test came back positive.  Brief/Interim Summary: Patient was admitted due to chest pain.  Troponins were checked, 58> 103> 141> 89.  However patient has no more chest pain and no shortness of breath.  She was tested positive for COVID-19 but she is not hypoxic and chest x-ray also unremarkable.  She has no leukocytosis and she is afebrile.  Transthoracic echo was checked which shows normal ejection fraction with no wall motion abnormality but grade 1 diastolic dysfunction.  Her elevated troponin is likely secondary to demand ischemia in the setting of COVID-19.  For COVID-19, she is not hypoxic either.   She had mild hypokalemia which was replaced.  D-dimer was ordered but that is also pending.  Patient has 0 score on the Wells criteria.  PE not suspected.  Patient symptoms are completely resolved.  She is being discharged in stable condition.  I will prescribe her molnupiravir .  Discharge plan was discussed with patient and/or family member and they verbalized understanding and agreed with it.  Discharge Diagnoses:  Principal Problem:   Chest pain Active Problems:   COVID-19 virus infection  PatientPhysician Discharge Summary  [name] JXB:[id number] DOB: 1941/02/10 DOA: 03/03/2022  PCP: Harvest Forest, MD  Admit date: 03/03/2022 Discharge date: 03/04/2022    Admitted From: Home Disposition: Home  Recommendations for Outpatient Follow-up:  Follow up with PCP in 1-2 weeks Please obtain BMP/CBC in one week Please follow up with your PCP on the following pending results: Unresulted Labs (From admission, onward)     Start     Ordered   03/11/22 0500  Creatinine, serum  (enoxaparin (LOVENOX)    CrCl >/= 30 ml/min)  Weekly,   R     Comments: while on enoxaparin therapy    03/04/22 0253   03/05/22 0500  CBC with Differential/Platelet  Daily at 5am,   R      03/04/22 0253   03/05/22 0500  Comprehensive metabolic panel  Daily at 5am,   R      03/04/22 0253   03/05/22 0500  C-reactive protein  Daily at 5am,   R      03/04/22 0253   03/05/22 0500  D-dimer, quantitative  Daily at 5am,   R      03/04/22 0253              Home Health: None Equipment/Devices: None  Discharge Condition: Stable CODE STATUS: Full code Diet recommendation: Cardiac  Subjective: Seen and examined.  Daughter at the bedside.  Nurse at the bedside as well.  Patient has no complaints.  She said her chest pain has completely resolved and she never had any shortness of breath, the only complaint she has is cough.  Discussed with the family, they are comfortable going home.  HPI: [name] is a 81 y.o. female with history of hyperlipidemia TIA presents to the ER with complaint of chest pain.  Patient states she has been having chest pain substernal nonradiating pressure-like present even at rest for the last 24 hours.  Over the last 2 days patient has been having some nonproductive cough.  Denies any nausea vomiting abdominal pain or shortness of breath.  Patient states over the last couple of weeks patient has been noticing increasing swelling in the right lower extremity.   Recently traveled to [place].   ED Course: In the ER patient EKG shows normal sinus rhythm chest x-ray unremarkable.  High sensitive troponin was 58 and 103.  Patient's COVID test came back positive.  Brief/Interim Summary: Patient was admitted due to chest pain.  Troponins were checked, 58> 103> 141> 89.  However patient has no more chest pain and no shortness of breath.  She was tested positive for COVID-19 but she is not hypoxic and chest x-ray also unremarkable.  She has no leukocytosis and she is afebrile.  Transthoracic echo was checked which shows normal ejection fraction with no wall motion abnormality but grade 1 diastolic dysfunction.  Her elevated troponin is likely secondary to demand ischemia in the setting of COVID-19.  For COVID-19, she is not hypoxic either.   She had mild hypokalemia which was replaced.  D-dimer was ordered but that is also pending.  Patient has 0 score on the Wells criteria.  PE not suspected.  Patient symptoms are completely resolved.  She is being discharged in stable condition.  I will prescribe her molnupiravir .  Discharge plan was discussed with patient and/or family member and they verbalized understanding and agreed with it.  Discharge Diagnoses:  Principal Problem:   Chest pain Active Problems:   COVID-19 virus infection

## 2022-03-04 NOTE — Progress Notes (Signed)
VASCULAR LAB    Bilateral lower extremity venous duplex has been performed.  See CV proc for preliminary results.   Makylee Sanborn, RVT 03/04/2022, 9:52 AM

## 2022-03-04 NOTE — H&P (Addendum)
History and Physical    [name] ZOX:[id number] DOB: 04-12-1941 DOA: 03/03/2022  PCP: Harvest Forest, MD  Patient coming from: Home.  Chief Complaint: Chest pain.  HPI: [name] is a 81 y.o. female with history of hyperlipidemia TIA presents to the ER with complaint of chest pain.  Patient states she has been having chest pain substernal nonradiating pressure-like present even at rest for the last 24 hours.  Over the last 2 days patient has been having some nonproductive cough.  Denies any nausea vomiting abdominal pain or shortness of breath.  Patient states over the last couple of weeks patient has been noticing increasing swelling in the right lower extremity.  Recently traveled to [place].  ED Course: In the ER patient EKG shows normal sinus rhythm chest x-ray unremarkable.  High sensitive troponin was 58 and 103.  Patient's COVID test came back positive.  Review of Systems: As per HPI, rest all negative.   Past Medical History:  Diagnosis Date   Acute sinus infection 01/15/2011   ANXIETY 10/03/2009   BARRETTS ESOPHAGUS 10/03/2009   CAROTID ARTERY DISEASE 08/31/2009   COLONIC POLYPS, HX OF 08/29/2009   CONSTIPATION, CHRONIC 10/03/2009   DEPRESSION 10/03/2009   DISC DISEASE, CERVICAL 08/29/2009   DISC DISEASE, LUMBAR 08/29/2009   Eustachian tube dysfunction 01/15/2011   GERD 08/29/2009   HYPERLIPIDEMIA 08/29/2009   Impaired glucose tolerance 01/14/2011   LEG PAIN, BILATERAL 08/29/2009   LOW BACK PAIN, CHRONIC 11/22/2009   PERIPHERAL NEUROPATHY 10/03/2009   PERIPHERAL VASCULAR DISEASE 08/29/2009   POSITIVE PPD 10/03/2009   Preventative health care 01/14/2011   Stroke (HCC)    Thyroid nodule 08/09/2011   TIA 11/22/2009   TRANSIENT ISCHEMIC ATTACK, HX OF 08/29/2009   URETHRAL STRICTURE 10/03/2009   VOCAL CORD POLYP, HX OF 10/03/2009   WRIST PAIN, BILATERAL 05/25/2010    Past Surgical History:  Procedure Laterality Date   hx of right ankle fracture     s/p vocal cord  polyps  1984   chronic hoarseness   stress test negative  2003,2004,2006   TUBAL LIGATION     tubaligation     uterine prolapse       reports that she has never smoked. She has never used smokeless tobacco. She reports that she does not drink alcohol and does not use drugs.  Allergies  Allergen Reactions   Atorvastatin     REACTION: myalgia   Penicillin G     Other reaction(s): swelling   Penicillins Hives and Itching    Has patient had a PCN reaction causing immediate rash, facial/tongue/throat swelling, SOB or lightheadedness with hypotension: no Has patient had a PCN reaction causing severe rash involving mucus membranes or skin necrosis: no Has patient had a PCN reaction that required hospitalization : no Has patient had a PCN reaction occurring within the last 10 years: no If all of the above answers are "NO", then may proceed with Cephalosporin use.     Family History  Problem Relation Age of Onset   Stroke Mother    Heart disease Mother    Hypertension Mother    Diabetes Mother    Multiple myeloma Sister    Cancer Other        lung    Prior to Admission medications   Medication Sig Start Date End Date Taking? Authorizing Provider  acetaminophen (TYLENOL) 325 MG tablet Take 650 mg by mouth every 6 (six) hours as needed for mild pain or headache. Patient  not taking: Reported on 01/15/2022    [provider]  alendronate (FOSAMAX) 70 MG tablet Take 70 mg by mouth once a week. 09/30/20   [provider]  clopidogrel (PLAVIX) 75 MG tablet     [provider]  Coenzyme Q10 (CO Q 10 PO)     [provider]  loratadine (CLARITIN) 10 MG tablet     [provider]  magnesium hydroxide (MILK OF MAGNESIA) 400 MG/5ML suspension     [provider]  Multiple Vitamins-Minerals (MULTIVITAMIN WITH MINERALS) tablet Take 1 tablet by mouth daily.    [provider]  Omega-3 Fatty Acids (FISH OIL PO)     [provider]  Omega-3 Fatty Acids (FISH OIL) 1000 MG CAPS Take 1,000 mg by mouth daily.     [provider]  rosuvastatin (CRESTOR) 40 MG tablet Take 1 tablet (40 mg total) by mouth daily. Patient not taking: Reported on 01/15/2022 12/30/19   Lezlie Lye, Meda Coffee, MD    Physical Exam: Constitutional: Moderately built and nourished. Vitals:   03/04/22 0115 03/04/22 0130 03/04/22 0145 03/04/22 0248  BP: 111/62 103/66 102/60   Pulse: 64 65 71   Resp: (!) 27 (!) 21 16   Temp:      TempSrc:      SpO2: 100% 99% 100%   Weight:    64.1 kg  Height:    5\' 6"  (1.676 m)   Eyes: Anicteric no pallor. ENMT: No discharge from the ears eyes nose and mouth. Neck: No mass felt.  No neck rigidity. Respiratory: No rhonchi or crepitations. Cardiovascular: S1-S2 heard. Abdomen: Soft nontender bowel sound present. Musculoskeletal: Mild edema of the right lower extremity. Skin: No rash. Neurologic: Alert awake oriented time place and person.  Moves all extremities. Psychiatric: Appears normal.  Normal affect.   Labs on Admission: I have personally reviewed following labs and imaging studies  CBC: Recent Labs  Lab 03/03/22 2159  WBC 5.1  HGB 14.5  HCT 44.4  MCV 96.3  PLT 123*   Basic Metabolic Panel: Recent Labs  Lab 03/03/22 2159  NA 140  K 3.4*  CL 106  CO2 27  GLUCOSE 104*  BUN 8  CREATININE 0.88  CALCIUM 9.1   GFR: Estimated Creatinine Clearance: 46.1 mL/min (by C-G formula based on SCr of 0.88 mg/dL). Liver Function Tests: Recent Labs  Lab 03/03/22 2159  AST 33  ALT 27  ALKPHOS 44  BILITOT 0.9  PROT 6.6  ALBUMIN 3.8   No results for input(s): "LIPASE", "AMYLASE" in the last 168 hours. No results for input(s): "AMMONIA" in the last 168 hours. Coagulation Profile: No results for input(s): "INR", "PROTIME" in the last 168 hours. Cardiac Enzymes: No results for input(s): "CKTOTAL", "CKMB", "CKMBINDEX", "TROPONINI" in the last 168 hours. BNP (last 3 results) No results  for input(s): "PROBNP" in the last 8760 hours. HbA1C: No results for input(s): "HGBA1C" in the last 72 hours. CBG: Recent Labs  Lab 03/03/22 2127  GLUCAP 97   Lipid Profile: No results for input(s): "CHOL", "HDL", "LDLCALC", "TRIG", "CHOLHDL", "LDLDIRECT" in the last 72 hours. Thyroid Function Tests: No results for input(s): "TSH", "T4TOTAL", "FREET4", "T3FREE", "THYROIDAB" in the last 72 hours. Anemia Panel: No results for input(s): "VITAMINB12", "FOLATE", "FERRITIN", "TIBC", "IRON", "RETICCTPCT" in the last 72 hours. Urine analysis:    Component Value Date/Time   COLORURINE YELLOW 03/03/2022 2147   APPEARANCEUR CLEAR 03/03/2022 2147   LABSPEC 1.009 03/03/2022 2147   PHURINE 7.0  03/03/2022 2147   GLUCOSEU NEGATIVE 03/03/2022 2147   GLUCOSEU NEGATIVE 05/14/2011 1702   HGBUR NEGATIVE 03/03/2022 2147   BILIRUBINUR NEGATIVE 03/03/2022 2147   BILIRUBINUR negative 07/18/2017 1200   KETONESUR NEGATIVE 03/03/2022 2147   PROTEINUR NEGATIVE 03/03/2022 2147   UROBILINOGEN 0.2 07/18/2017 1200   UROBILINOGEN 0.2 08/21/2014 2251   NITRITE NEGATIVE 03/03/2022 2147   LEUKOCYTESUR NEGATIVE 03/03/2022 2147   Sepsis Labs: @LABRCNTIP (procalcitonin:4,lacticidven:4) ) Recent Results (from the past 240 hour(s))  SARS Coronavirus 2 by RT PCR (hospital order, performed in [facility] hospital lab) *cepheid single result test* Anterior Nasal Swab     Status: Abnormal   Collection Time: 03/04/22 12:03 AM   Specimen: Anterior Nasal Swab  Result Value Ref Range Status   SARS Coronavirus 2 by RT PCR POSITIVE (A) NEGATIVE Final    Comment: (NOTE) SARS-CoV-2 target nucleic acids are DETECTED  SARS-CoV-2 RNA is generally detectable in upper respiratory specimens  during the acute phase of infection.  Positive results are indicative  of the presence of the identified virus, but do not rule out bacterial infection or co-infection with other pathogens not detected by the test.  Clinical correlation  with patient history and  other diagnostic information is necessary to determine patient infection status.  The expected result is negative.  Fact Sheet for Patients:   RoadLapTop.co.za   Fact Sheet for Healthcare Providers:   http://kim-miller.com/    This test is not yet approved or cleared by the [place] FDA and  has been authorized for detection and/or diagnosis of SARS-CoV-2 by FDA under an Emergency Use Authorization (EUA).  This EUA will remain in effect (meaning this test can be used) for the duration of  the COVID-19 declaration under Section 564(b)(1)  of the Act, 21 U.S.C. section 360-bbb-3(b)(1), unless the authorization is terminated or revoked sooner.   Performed at [facility], [address] [place], [place] [address]      Radiological Exams on Admission: DG Chest 2 View  Result Date: 03/03/2022 CLINICAL DATA:  Sore throat, chest pain, fever EXAM: CHEST - 2 VIEW COMPARISON:  03/24/2021 FINDINGS: Frontal and lateral views of the chest demonstrate an unremarkable cardiac silhouette. No acute airspace disease, effusion, or pneumothorax. No acute bony abnormalities. IMPRESSION: 1. No acute intrathoracic process. Electronically Signed   By: Sharlet Salina M.D.   On: 03/03/2022 21:39    EKG: Independently reviewed.  Normal sinus rhythm.  Assessment/Plan Principal Problem:   Chest pain Active Problems:   COVID-19 virus infection    Chest pain with elevated troponin -pain is present even at rest.  Given the elevated troponin we will cycle cardiac markers check 2D echo keep patient on aspirin continue patient's statins.  Consult cardiology in the morning.  Patient's chest pain could also be related to COVID being positive.  However since patient has recent travel and right lower extremity edema will check CT angiogram of the chest to rule out PE Dopplers to rule out DVT. COVID positive with fevers.  Patient is not  hypoxic chest x-ray does not show any infiltrates.  We will check D-dimer CRP levels.  For now patient has agreed to be started on molnupiravir. History of TIA per the chart was on statins and aspirin.  Which will be continued. Mild hypokalemia replace and recheck. Recently had MRI of the brain for memory issues.   DVT prophylaxis: Lovenox. Code Status: Full code. Family Communication: Discussed with patient's daughter. Disposition Plan: Home. Consults called: None. Admission  status: Observation.   Eduard Clos MD [facility] Pager [phone].  If 7PM-7AM, please contact night-coverage [email or web address] Password [email or web address]  03/04/2022, 2:53 AM

## 2022-03-04 NOTE — Progress Notes (Signed)
Ready for discharge to home. Pt appears flush in face and temp checked = 100.7.  C/O headache as well. APAP 650 mg po given. Dr Jacqulyn Bath notified of low grade temp. Okay for d/c to home.

## 2022-03-04 NOTE — Progress Notes (Signed)
  Echocardiogram 2D Echocardiogram has been performed.  Gerda Diss 03/04/2022, 1:39 PM

## 2022-03-04 NOTE — TOC Transition Note (Signed)
Transition of Care (TOC) - CM/SW Discharge Note Donn Pierini RN, BSN Transitions of Care Unit 4E- RN Case Manager See Treatment Team for direct phone #  Weekend cross coverage  Patient Details  Name: Rolinda Impson MRN: [id number] Date of Birth: 1941/04/03  Transition of Care [facility]) CM/SW Contact:  Darrold Span, RN Phone Number: 03/04/2022, 2:07 PM   Clinical Narrative:    Transition of Care Department [facility]) has reviewed patient and no TOC needs have been identified at this time.  Pt has PCP and insurance coverage.  Pt stable for transition home today.   Final next level of care: Home/Self Care Barriers to Discharge: No Barriers Identified   Patient Goals and CMS Choice    N/A    Discharge Placement               Home        Discharge Plan and Services                   NA                  Social Determinants of Health (SDOH) Interventions     Readmission Risk Interventions     No data to display

## 2022-03-08 DIAGNOSIS — R911 Solitary pulmonary nodule: Secondary | ICD-10-CM | POA: Diagnosis not present

## 2022-03-08 DIAGNOSIS — E876 Hypokalemia: Secondary | ICD-10-CM | POA: Diagnosis not present

## 2022-03-08 DIAGNOSIS — J329 Chronic sinusitis, unspecified: Secondary | ICD-10-CM | POA: Diagnosis not present

## 2022-03-08 DIAGNOSIS — Z7689 Persons encountering health services in other specified circumstances: Secondary | ICD-10-CM | POA: Diagnosis not present

## 2022-03-08 DIAGNOSIS — U099 Post covid-19 condition, unspecified: Secondary | ICD-10-CM | POA: Diagnosis not present

## 2022-03-08 DIAGNOSIS — I700 Atherosclerosis of aorta: Secondary | ICD-10-CM | POA: Diagnosis not present

## 2022-03-08 DIAGNOSIS — D696 Thrombocytopenia, unspecified: Secondary | ICD-10-CM | POA: Diagnosis not present

## 2022-03-08 DIAGNOSIS — I2510 Atherosclerotic heart disease of native coronary artery without angina pectoris: Secondary | ICD-10-CM | POA: Diagnosis not present

## 2022-03-19 DIAGNOSIS — D696 Thrombocytopenia, unspecified: Secondary | ICD-10-CM | POA: Diagnosis not present

## 2022-03-19 DIAGNOSIS — R911 Solitary pulmonary nodule: Secondary | ICD-10-CM | POA: Diagnosis not present

## 2022-03-19 DIAGNOSIS — G3184 Mild cognitive impairment, so stated: Secondary | ICD-10-CM | POA: Diagnosis not present

## 2022-03-19 DIAGNOSIS — I2510 Atherosclerotic heart disease of native coronary artery without angina pectoris: Secondary | ICD-10-CM | POA: Diagnosis not present

## 2022-03-19 DIAGNOSIS — J329 Chronic sinusitis, unspecified: Secondary | ICD-10-CM | POA: Diagnosis not present

## 2022-03-19 DIAGNOSIS — N3281 Overactive bladder: Secondary | ICD-10-CM | POA: Diagnosis not present

## 2022-03-19 DIAGNOSIS — I700 Atherosclerosis of aorta: Secondary | ICD-10-CM | POA: Diagnosis not present

## 2022-03-19 DIAGNOSIS — R2689 Other abnormalities of gait and mobility: Secondary | ICD-10-CM | POA: Diagnosis not present

## 2022-03-19 DIAGNOSIS — R490 Dysphonia: Secondary | ICD-10-CM | POA: Diagnosis not present

## 2022-03-19 DIAGNOSIS — I83892 Varicose veins of left lower extremities with other complications: Secondary | ICD-10-CM | POA: Diagnosis not present

## 2022-03-19 DIAGNOSIS — M810 Age-related osteoporosis without current pathological fracture: Secondary | ICD-10-CM | POA: Diagnosis not present

## 2022-04-05 DIAGNOSIS — H2513 Age-related nuclear cataract, bilateral: Secondary | ICD-10-CM | POA: Diagnosis not present

## 2022-04-05 DIAGNOSIS — H04123 Dry eye syndrome of bilateral lacrimal glands: Secondary | ICD-10-CM | POA: Diagnosis not present

## 2022-05-03 DIAGNOSIS — R3915 Urgency of urination: Secondary | ICD-10-CM | POA: Diagnosis not present

## 2022-05-03 DIAGNOSIS — M545 Low back pain, unspecified: Secondary | ICD-10-CM | POA: Diagnosis not present

## 2022-06-18 DIAGNOSIS — M79604 Pain in right leg: Secondary | ICD-10-CM | POA: Diagnosis not present

## 2022-06-18 DIAGNOSIS — Z23 Encounter for immunization: Secondary | ICD-10-CM | POA: Diagnosis not present

## 2022-07-12 ENCOUNTER — Inpatient Hospital Stay: Admission: RE | Admit: 2022-07-12 | Payer: Medicare Other | Source: Ambulatory Visit

## 2022-07-13 DIAGNOSIS — I10 Essential (primary) hypertension: Secondary | ICD-10-CM | POA: Diagnosis not present

## 2022-07-13 DIAGNOSIS — E785 Hyperlipidemia, unspecified: Secondary | ICD-10-CM | POA: Diagnosis not present

## 2022-07-18 ENCOUNTER — Other Ambulatory Visit: Payer: Self-pay | Admitting: Internal Medicine

## 2022-07-18 DIAGNOSIS — Z1211 Encounter for screening for malignant neoplasm of colon: Secondary | ICD-10-CM | POA: Diagnosis not present

## 2022-07-18 DIAGNOSIS — Z23 Encounter for immunization: Secondary | ICD-10-CM | POA: Diagnosis not present

## 2022-07-18 DIAGNOSIS — Z0001 Encounter for general adult medical examination with abnormal findings: Secondary | ICD-10-CM | POA: Diagnosis not present

## 2022-07-18 DIAGNOSIS — I700 Atherosclerosis of aorta: Secondary | ICD-10-CM | POA: Diagnosis not present

## 2022-07-18 DIAGNOSIS — I6529 Occlusion and stenosis of unspecified carotid artery: Secondary | ICD-10-CM

## 2022-07-18 DIAGNOSIS — Z1239 Encounter for other screening for malignant neoplasm of breast: Secondary | ICD-10-CM | POA: Diagnosis not present

## 2022-07-18 DIAGNOSIS — I10 Essential (primary) hypertension: Secondary | ICD-10-CM | POA: Diagnosis not present

## 2022-07-18 DIAGNOSIS — D696 Thrombocytopenia, unspecified: Secondary | ICD-10-CM | POA: Diagnosis not present

## 2022-07-18 DIAGNOSIS — W19XXXA Unspecified fall, initial encounter: Secondary | ICD-10-CM | POA: Diagnosis not present

## 2022-07-18 DIAGNOSIS — G3184 Mild cognitive impairment, so stated: Secondary | ICD-10-CM | POA: Diagnosis not present

## 2022-07-18 DIAGNOSIS — E785 Hyperlipidemia, unspecified: Secondary | ICD-10-CM | POA: Diagnosis not present

## 2022-07-19 ENCOUNTER — Other Ambulatory Visit: Payer: Self-pay | Admitting: Internal Medicine

## 2022-07-19 DIAGNOSIS — M810 Age-related osteoporosis without current pathological fracture: Secondary | ICD-10-CM

## 2022-08-01 ENCOUNTER — Ambulatory Visit
Admission: RE | Admit: 2022-08-01 | Discharge: 2022-08-01 | Disposition: A | Discharge status: Home / Self Care | Payer: Medicare Other | Source: Ambulatory Visit | Attending: Internal Medicine | Admitting: Internal Medicine

## 2022-08-01 DIAGNOSIS — I6529 Occlusion and stenosis of unspecified carotid artery: Secondary | ICD-10-CM

## 2022-08-20 ENCOUNTER — Ambulatory Visit
Admission: RE | Admit: 2022-08-20 | Discharge: 2022-08-20 | Disposition: A | Discharge status: Home / Self Care | Payer: Medicare HMO | Source: Ambulatory Visit | Attending: Family Medicine | Admitting: Family Medicine

## 2022-08-20 ENCOUNTER — Other Ambulatory Visit: Payer: Self-pay | Admitting: Family Medicine

## 2022-08-20 DIAGNOSIS — M25551 Pain in right hip: Secondary | ICD-10-CM

## 2023-01-09 ENCOUNTER — Other Ambulatory Visit: Payer: Medicare Other

## 2023-01-16 ENCOUNTER — Emergency Department (HOSPITAL_COMMUNITY): Payer: Medicare Other

## 2023-01-16 ENCOUNTER — Emergency Department (HOSPITAL_COMMUNITY)
Admission: EM | Admit: 2023-01-16 | Discharge: 2023-01-16 | Disposition: A | Discharge status: Home / Self Care | Payer: Medicare Other | Attending: Emergency Medicine | Admitting: Emergency Medicine

## 2023-01-16 ENCOUNTER — Other Ambulatory Visit: Payer: Self-pay

## 2023-01-16 ENCOUNTER — Encounter (HOSPITAL_COMMUNITY): Payer: Self-pay

## 2023-01-16 DIAGNOSIS — I10 Essential (primary) hypertension: Secondary | ICD-10-CM | POA: Insufficient documentation

## 2023-01-16 DIAGNOSIS — R519 Headache, unspecified: Secondary | ICD-10-CM | POA: Diagnosis present

## 2023-01-16 DIAGNOSIS — Z20822 Contact with and (suspected) exposure to covid-19: Secondary | ICD-10-CM | POA: Diagnosis not present

## 2023-01-16 DIAGNOSIS — Z79899 Other long term (current) drug therapy: Secondary | ICD-10-CM | POA: Insufficient documentation

## 2023-01-16 DIAGNOSIS — Z7902 Long term (current) use of antithrombotics/antiplatelets: Secondary | ICD-10-CM | POA: Diagnosis not present

## 2023-01-16 LAB — URINALYSIS, ROUTINE W REFLEX MICROSCOPIC
Bilirubin Urine: NEGATIVE
Glucose, UA: NEGATIVE mg/dL
Hgb urine dipstick: NEGATIVE
Ketones, ur: NEGATIVE mg/dL
Leukocytes,Ua: NEGATIVE
Nitrite: NEGATIVE
Protein, ur: NEGATIVE mg/dL
Specific Gravity, Urine: 1.006 (ref 1.005–1.030)
pH: 7 (ref 5.0–8.0)

## 2023-01-16 LAB — CBC WITH DIFFERENTIAL/PLATELET
Abs Immature Granulocytes: 0 10*3/uL (ref 0.00–0.07)
Basophils Absolute: 0 10*3/uL (ref 0.0–0.1)
Basophils Relative: 1 %
Eosinophils Absolute: 0.1 10*3/uL (ref 0.0–0.5)
Eosinophils Relative: 1 %
HCT: 46.6 % — ABNORMAL HIGH (ref 36.0–46.0)
Hemoglobin: 15.1 g/dL — ABNORMAL HIGH (ref 12.0–15.0)
Immature Granulocytes: 0 %
Lymphocytes Relative: 44 %
Lymphs Abs: 2 10*3/uL (ref 0.7–4.0)
MCH: 31.7 pg (ref 26.0–34.0)
MCHC: 32.4 g/dL (ref 30.0–36.0)
MCV: 97.7 fL (ref 80.0–100.0)
Monocytes Absolute: 0.3 10*3/uL (ref 0.1–1.0)
Monocytes Relative: 7 %
Neutro Abs: 2.2 10*3/uL (ref 1.7–7.7)
Neutrophils Relative %: 47 %
Platelets: 148 10*3/uL — ABNORMAL LOW (ref 150–400)
RBC: 4.77 MIL/uL (ref 3.87–5.11)
RDW: 14.6 % (ref 11.5–15.5)
WBC: 4.6 10*3/uL (ref 4.0–10.5)
nRBC: 0 % (ref 0.0–0.2)

## 2023-01-16 LAB — COMPREHENSIVE METABOLIC PANEL
ALT: 17 U/L (ref 0–44)
AST: 24 U/L (ref 15–41)
Albumin: 4.1 g/dL (ref 3.5–5.0)
Alkaline Phosphatase: 42 U/L (ref 38–126)
Anion gap: 8 (ref 5–15)
BUN: 14 mg/dL (ref 8–23)
CO2: 28 mmol/L (ref 22–32)
Calcium: 9.8 mg/dL (ref 8.9–10.3)
Chloride: 104 mmol/L (ref 98–111)
Creatinine, Ser: 0.85 mg/dL (ref 0.44–1.00)
GFR, Estimated: >60 mL/min (ref >60)
Glucose, Bld: 91 mg/dL (ref 70–99)
Potassium: 3.7 mmol/L (ref 3.5–5.1)
Sodium: 140 mmol/L (ref 135–145)
Total Bilirubin: 1 mg/dL (ref 0.3–1.2)
Total Protein: 7 g/dL (ref 6.5–8.1)

## 2023-01-16 LAB — SARS CORONAVIRUS 2 BY RT PCR: SARS Coronavirus 2 by RT PCR: NEGATIVE

## 2023-01-16 MED ORDER — HYDROCHLOROTHIAZIDE 12.5 MG PO TABS
12.5000 mg | ORAL_TABLET | Freq: Once | ORAL | Status: AC
  Administered 2023-01-16: 12.5 mg via ORAL
  Filled 2023-01-16: qty 1

## 2023-01-16 MED ORDER — ACETAMINOPHEN 325 MG PO TABS
650.0000 mg | ORAL_TABLET | Freq: Once | ORAL | Status: AC
  Administered 2023-01-16: 650 mg via ORAL
  Filled 2023-01-16: qty 2

## 2023-01-16 MED ORDER — HYDROCHLOROTHIAZIDE 25 MG PO TABS: 12.5000 mg | ORAL_TABLET | Freq: Every day | ORAL | 0 refills | Status: AC

## 2023-01-16 NOTE — ED Triage Notes (Signed)
Pt reports two high blood pressure readings 180/103. C/o headache and feeling "sluggish"

## 2023-01-16 NOTE — Discharge Instructions (Addendum)
You were seen in the emergency department for elevated blood pressure.  Your labs and imaging were all reassuring and I would advise a follow-up with your primary care provider to discuss blood pressure.  You may benefit from restarting your antihypertensive medication.  If you have any acute worsening of her symptoms, please return to the emergency department.

## 2023-01-16 NOTE — ED Provider Notes (Signed)
[place] EMERGENCY DEPARTMENT AT [facility] Provider Note   CSN: [id number] Arrival date & time: 01/16/23  1556     History Chief Complaint  Patient presents with   Hypertension    [name] is a 82 y.o. female.  Patient presents to the emergency department complaints of hypertension.  Patient denies any prior history of known hypertension reports that she was checking blood pressure this morning when blood pressure was elevated around 160/90.  Not currently on antihypertensives.  After to repeat readings showing blood pressure greater than 170/100, patient came to the emergency department for evaluation.  Does endorse some mild headache but denies any visual changes, weakness or numbness unilaterally, and no speech abnormality or facial asymmetry.  Reports that he is generally in good health without any significant chronic health issues but does have a prior history significant for TIA, CVA, dizziness.   Hypertension Associated symptoms include headaches.       Home Medications Prior to Admission medications   Medication Sig Start Date End Date Taking? Authorizing Provider  hydrochlorothiazide (HYDRODIURIL) 25 MG tablet Take 0.5 tablets (12.5 mg total) by mouth daily. 01/16/23 02/15/23 Yes Smitty Knudsen, PA-C  alendronate (FOSAMAX) 70 MG tablet Take 70 mg by mouth once a week. 09/30/20   [provider]  clopidogrel (PLAVIX) 75 MG tablet     [provider]  Coenzyme Q10 (CO Q 10 PO)     [provider]  Multiple Vitamins-Minerals (MULTIVITAMIN WITH MINERALS) tablet Take 1 tablet by mouth daily.    [provider]  Omega-3 Fatty Acids (FISH OIL) 1000 MG CAPS Take 1,000 mg by mouth daily.     [provider]      Allergies    Atorvastatin, Penicillin g, and Penicillins    Review of Systems   Review of Systems  Neurological:  Positive for headaches.  All other systems reviewed and are negative.   Physical  Exam Updated Vital Signs BP 133/82   Pulse 61   Temp 98.4 F (36.9 C)   Resp 17   Ht 5\' 6"  (1.676 m)   Wt 64 kg   SpO2 98%   BMI 22.77 kg/m  Physical Exam Vitals and nursing note reviewed.  Constitutional:      General: She is not in acute distress.    Appearance: She is well-developed.  HENT:     Head: Normocephalic and atraumatic.  Eyes:     Conjunctiva/sclera: Conjunctivae normal.  Cardiovascular:     Rate and Rhythm: Normal rate and regular rhythm.     Heart sounds: No murmur heard. Pulmonary:     Effort: Pulmonary effort is normal. No respiratory distress.     Breath sounds: Normal breath sounds.  Abdominal:     Palpations: Abdomen is soft.     Tenderness: There is no abdominal tenderness.  Musculoskeletal:        General: No swelling.     Cervical back: Neck supple.  Skin:    General: Skin is warm and dry.     Capillary Refill: Capillary refill takes less than 2 seconds.  Neurological:     Mental Status: She is alert.  Psychiatric:        Mood and Affect: Mood normal.     ED Results / Procedures / Treatments   Labs (all labs ordered are listed, but only abnormal results are displayed) Labs Reviewed  CBC WITH DIFFERENTIAL/PLATELET - Abnormal; Notable for the following components:  Result Value   Hemoglobin 15.1 (*)    HCT 46.6 (*)    Platelets 148 (*)    All other components within normal limits  URINALYSIS, ROUTINE W REFLEX MICROSCOPIC - Abnormal; Notable for the following components:   Color, Urine STRAW (*)    All other components within normal limits  SARS CORONAVIRUS 2 BY RT PCR  COMPREHENSIVE METABOLIC PANEL    EKG None  Radiology CT Head Wo Contrast  Result Date: 01/16/2023 CLINICAL DATA:  Headache. EXAM: CT HEAD WITHOUT CONTRAST TECHNIQUE: Contiguous axial images were obtained from the base of the skull through the vertex without intravenous contrast. RADIATION DOSE REDUCTION: This exam was performed according to the departmental  dose-optimization program which includes automated exposure control, adjustment of the mA and/or kV according to patient size and/or use of iterative reconstruction technique. COMPARISON:  Head CT dated 05/05/2019. FINDINGS: Brain: Mild age-related atrophy and chronic microvascular ischemic changes. There is no acute intracranial hemorrhage. No mass effect or midline shift no extra-axial fluid collection. Vascular: No hyperdense vessel or unexpected calcification. Skull: Normal. Negative for fracture or focal lesion. Sinuses/Orbits: No acute finding. Other: None IMPRESSION: 1. No acute intracranial pathology. 2. Mild age-related atrophy and chronic microvascular ischemic changes. Electronically Signed   By: Elgie Collard M.D.   On: 01/16/2023 17:17    Procedures Procedures   Medications Ordered in ED Medications  acetaminophen (TYLENOL) tablet 650 mg (650 mg Oral Given 01/16/23 1818)  hydrochlorothiazide (HYDRODIURIL) tablet 12.5 mg (12.5 mg Oral Given 01/16/23 2036)    ED Course/ Medical Decision Making/ A&P                           Medical Decision Making Amount and/or Complexity of Data Reviewed Labs: ordered. Radiology: ordered.  Risk OTC drugs. Prescription drug management.   This patient presents to the ED for concern of hypertension.  Differential diagnosis includes hypertensive urgency, hypertensive emergency, stroke, TIA, migraine   Lab Tests:  I Ordered, and personally interpreted labs.  The pertinent results include: CBC unremarkable, CMP unremarkable, UA without evidence of infection, COVID-19 negative   Imaging Studies ordered:  I ordered imaging studies including CT head I independently visualized and interpreted imaging which showed no acute intracranial abnormality I agree with the radiologist interpretation   Medicines ordered and prescription drug management:  I ordered medication including Tylenol, hydrochlorothiazide for pain, high blood  pressure Reevaluation of the patient after these medicines showed that the patient improved I have reviewed the patients home medicines and have made adjustments as needed   Problem List / ED Course:  Patient presents to the emergency department complaints of hypertension.  Past history significant for prior strokes and TIAs.  Denies any history of hypertension but does endorse a current headache not typical for her.  Denies any unilateral weakness or numbness or new onset facial droop or slurred speech.  Given a somewhat symptomatic presentation of hypertension in the setting of a headache, will evaluate patient with CT imaging ordered basic labs.  Advised patient of this plan and patient is agreeable.  Will reassess patient shortly. Lab work unremarkable for any acute abnormality.  CBC normal, CMP normal, UA without evidence of infection, COVID-19 negative.  CT head without any evidence of any acute abnormality as well.  Informed patient of these findings and spoke with patient's daughter discussing treatment plan.  Patient's daughter is insisting on having patient administered an oral antihypertensive given that patient has  been having persistently elevated blood pressure although she is not currently on any antihypertensives and she reports she has not been diagnosed with hypertension.  On chart review, patient was taking a combo pill of losartan and hydrochlorothiazide.  Will administer low-dose hydrochlorothiazide 12.5 mg and reassess patient for blood pressure control. Blood pressure improved with administration of hydrochlorothiazide.  Will prescribe a short course of this medication advised patient to follow-up with her primary care provider for further management of blood pressure control.  No acute concerns at this time for any neurological deficits that she is at baseline without any obvious abnormality.  Patient is agreeable to treatment plan and is currently stable for discharge home and  verbalized understanding all return precautions.  All questions answered prior to patient discharge.  Final Clinical Impression(s) / ED Diagnoses Final diagnoses:  Asymptomatic hypertension    Rx / DC Orders ED Discharge Orders          Ordered    hydrochlorothiazide (HYDRODIURIL) 25 MG tablet  Daily        01/16/23 2230              Salomon Mast 01/16/23 2323    Mardene Sayer, MD 01/17/23 1202

## 2023-05-02 ENCOUNTER — Emergency Department (HOSPITAL_COMMUNITY): Payer: Medicare Other

## 2023-05-02 ENCOUNTER — Other Ambulatory Visit: Payer: Self-pay

## 2023-05-02 ENCOUNTER — Emergency Department (HOSPITAL_COMMUNITY)
Admission: EM | Admit: 2023-05-02 | Discharge: 2023-05-02 | Disposition: A | Discharge status: Home / Self Care | Payer: Medicare HMO | Attending: Emergency Medicine | Admitting: Emergency Medicine

## 2023-05-02 DIAGNOSIS — R42 Dizziness and giddiness: Secondary | ICD-10-CM | POA: Diagnosis present

## 2023-05-02 DIAGNOSIS — R718 Other abnormality of red blood cells: Secondary | ICD-10-CM | POA: Insufficient documentation

## 2023-05-02 DIAGNOSIS — R944 Abnormal results of kidney function studies: Secondary | ICD-10-CM | POA: Insufficient documentation

## 2023-05-02 DIAGNOSIS — Z79899 Other long term (current) drug therapy: Secondary | ICD-10-CM | POA: Insufficient documentation

## 2023-05-02 DIAGNOSIS — E876 Hypokalemia: Secondary | ICD-10-CM | POA: Insufficient documentation

## 2023-05-02 LAB — BASIC METABOLIC PANEL
Anion gap: 10 (ref 5–15)
BUN: 8 mg/dL (ref 8–23)
CO2: 32 mmol/L (ref 22–32)
Calcium: 9.7 mg/dL (ref 8.9–10.3)
Chloride: 96 mmol/L — ABNORMAL LOW (ref 98–111)
Creatinine, Ser: 1.06 mg/dL — ABNORMAL HIGH (ref 0.44–1.00)
GFR, Estimated: 52 mL/min — ABNORMAL LOW (ref >60)
Glucose, Bld: 112 mg/dL — ABNORMAL HIGH (ref 70–99)
Potassium: 2.7 mmol/L — CL (ref 3.5–5.1)
Sodium: 138 mmol/L (ref 135–145)

## 2023-05-02 LAB — URINALYSIS, ROUTINE W REFLEX MICROSCOPIC
Bilirubin Urine: NEGATIVE
Glucose, UA: NEGATIVE mg/dL
Hgb urine dipstick: NEGATIVE
Ketones, ur: NEGATIVE mg/dL
Leukocytes,Ua: NEGATIVE
Nitrite: NEGATIVE
Protein, ur: NEGATIVE mg/dL
Specific Gravity, Urine: 1.005 (ref 1.005–1.030)
pH: 7 (ref 5.0–8.0)

## 2023-05-02 LAB — HEPATIC FUNCTION PANEL
ALT: 31 U/L (ref 0–44)
AST: 43 U/L — ABNORMAL HIGH (ref 15–41)
Albumin: 4 g/dL (ref 3.5–5.0)
Alkaline Phosphatase: 43 U/L (ref 38–126)
Bilirubin, Direct: 0.2 mg/dL (ref 0.0–0.2)
Indirect Bilirubin: 0.7 mg/dL (ref 0.3–0.9)
Total Bilirubin: 0.9 mg/dL (ref 0.3–1.2)
Total Protein: 7.5 g/dL (ref 6.5–8.1)

## 2023-05-02 LAB — CBC
HCT: 45.8 % (ref 36.0–46.0)
Hemoglobin: 15.1 g/dL — ABNORMAL HIGH (ref 12.0–15.0)
MCH: 31 pg (ref 26.0–34.0)
MCHC: 33 g/dL (ref 30.0–36.0)
MCV: 94 fL (ref 80.0–100.0)
Platelets: 164 10*3/uL (ref 150–400)
RBC: 4.87 MIL/uL (ref 3.87–5.11)
RDW: 14 % (ref 11.5–15.5)
WBC: 4.2 10*3/uL (ref 4.0–10.5)
nRBC: 0 % (ref 0.0–0.2)

## 2023-05-02 LAB — MAGNESIUM: Magnesium: 2.5 mg/dL — ABNORMAL HIGH (ref 1.7–2.4)

## 2023-05-02 LAB — CBG MONITORING, ED: Glucose-Capillary: 88 mg/dL (ref 70–99)

## 2023-05-02 LAB — TROPONIN I (HIGH SENSITIVITY): Troponin I (High Sensitivity): 3 ng/L (ref <18)

## 2023-05-02 MED ORDER — POTASSIUM CHLORIDE CRYS ER 20 MEQ PO TBCR
40.0000 meq | EXTENDED_RELEASE_TABLET | Freq: Once | ORAL | Status: AC
  Administered 2023-05-02: 40 meq via ORAL
  Filled 2023-05-02: qty 2

## 2023-05-02 MED ORDER — POTASSIUM CHLORIDE CRYS ER 20 MEQ PO TBCR
20.0000 meq | EXTENDED_RELEASE_TABLET | Freq: Two times a day (BID) | ORAL | 0 refills | Status: AC
Start: 2023-05-02 — End: ?

## 2023-05-02 MED ORDER — SODIUM CHLORIDE 0.9 % IV BOLUS
500.0000 mL | Freq: Once | INTRAVENOUS | Status: AC
  Administered 2023-05-02: 500 mL via INTRAVENOUS

## 2023-05-02 NOTE — ED Triage Notes (Addendum)
Pt. Stated, It seems like for the last week, every time I get up from a sitting position I get dizzy. Once that passes for a moment or 2 I'm ok. Sometimes I have a slight headache. VAN - neg.

## 2023-05-02 NOTE — ED Notes (Signed)
PT gone to xray, unable to draw labs at this time.

## 2023-05-02 NOTE — ED Notes (Signed)
Pt ambulated to the restroom with no issues.  

## 2023-05-02 NOTE — Discharge Instructions (Addendum)
Please read and follow all provided instructions.  Your diagnoses today include:  1. Orthostatic lightheadedness   2. Hypokalemia     Tests performed today include: An EKG of your heart: No concerning findings A chest x-ray: Was clear Cardiac enzymes - a blood test for heart muscle damage was normal Blood counts and electrolytes: Your potassium was very low at 2.7, otherwise no problems Vital signs. See below for your results today.   Medications prescribed:  Potassium supplement Take any prescribed medications only as directed.  Home care instructions: Please check your blood pressure before you take your medication in the morning.  If the top number is less than 140 or the bottom number is less than 90, your blood pressure is probably low enough and do not take your blood pressure medication.  Follow-up instructions: Please follow-up with your primary care provider in 1 week for further evaluation of your symptoms.   Return instructions:  SEEK IMMEDIATE MEDICAL ATTENTION IF: You have severe chest pain, especially if the pain is crushing or pressure-like and spreads to the arms, back, neck, or jaw, or if you have sweating, nausea or vomiting, or trouble with breathing. THIS IS AN EMERGENCY. Do not wait to see if the pain will go away. Get medical help at once. Call 911. DO NOT drive yourself to the hospital.  Your chest pain gets worse and does not go away after a few minutes of rest.  You have an attack of chest pain lasting longer than what you usually experience.  You have significant dizziness, if you pass out, or have trouble walking.  You have chest pain not typical of your usual pain for which you originally saw your caregiver.  You have any other emergent concerns regarding your health.  Additional Information: Chest pain comes from many different causes. Your caregiver has diagnosed you as having chest pain that is not specific for one problem, but does not require  admission.  You are at low risk for an acute heart condition or other serious illness.   Your vital signs today were: BP 134/77   Pulse 75   Temp 98.5 F (36.9 C) (Oral)   Resp 12   Ht 5\' 6"  (1.676 m)   Wt 62.6 kg   SpO2 100%   BMI 22.27 kg/m  If your blood pressure (BP) was elevated above 135/85 this visit, please have this repeated by your doctor within one month. --------------

## 2023-05-02 NOTE — ED Provider Notes (Signed)
[place] EMERGENCY DEPARTMENT AT [facility] Provider Note   CSN: [id number] Arrival date & time: 05/02/23  1036     History  Chief Complaint  Patient presents with   Dizziness    [name] is a 82 y.o. female.  Patient presents to the emergency department today for evaluation of orthostasis.  Patient states that for the past couple of weeks she has been feeling generally unwell.  She is describes this as a lack of energy and a generalized weakness.  No fevers or infectious symptoms.  No vomiting or diarrhea.  Physical therapy came out to her house today.  He currently lives at home.  Patient states that they did her vital signs and when she stood up her pressures were very low.  She reports lightheadedness with standing over the past couple of weeks and "staggering" if she tries to walk right after standing.  This then normalizes and the lightheadedness resolves. Patient denies signs of stroke including: facial droop, slurred speech, aphasia, weakness/numbness in extremities, imbalance/trouble walking.  No chest pain or shortness of breath.  No blood noted in the urine, stool.  No lower extremity swelling.  Patient last took her medications yesterday.  She thinks that she could be on a blood pressure medication but cannot tell me the name of the medication.  She did have an emergency department visit several months ago where she was started on low-dose HCTZ.       Home Medications Prior to Admission medications   Medication Sig Start Date End Date Taking? Authorizing Provider  acetaminophen (TYLENOL) 650 MG CR tablet Take 650 mg by mouth daily as needed for pain.   Yes [provider]  alendronate (FOSAMAX) 70 MG tablet Take 70 mg by mouth once a week. 09/30/20  Yes [provider]  aspirin EC 81 MG tablet Take 81 mg by mouth daily. 12/13/16  Yes [provider]  Coenzyme Q10 (CO Q 10 PO) Take 1 tablet by mouth daily.   Yes [provider]  hydrochlorothiazide (HYDRODIURIL) 25 MG tablet Take 0.5 tablets (12.5 mg total) by mouth daily. Patient taking differently: Take 12.5 mg by mouth 2 (two) times daily. 01/16/23 05/02/23 Yes Smitty Knudsen, PA-C  memantine (NAMENDA) 5 MG tablet Take 5 mg by mouth 2 (two) times daily. 02/12/23  Yes [provider]  Multiple Vitamins-Minerals (MULTIVITAMIN WITH MINERALS) tablet Take 1 tablet by mouth daily.   Yes [provider]  naproxen (NAPROSYN) 375 MG tablet Take 375 mg by mouth 2 (two) times daily with a meal. 11/03/14  Yes [provider]  rosuvastatin (CRESTOR) 40 MG tablet Take 40 mg by mouth daily.   Yes [provider]      Allergies    Atorvastatin, Penicillin g, and Penicillins    Review of Systems   Review of Systems  Physical Exam Updated Vital Signs BP 139/85 (BP Location: Right Arm)   Pulse 80   Temp 98.5 F (36.9 C) (Oral)   Resp 14   Ht 5\' 6"  (1.676 m)   Wt 62.6 kg   SpO2 100%   BMI 22.27 kg/m  Physical Exam Vitals and nursing note reviewed.  Constitutional:      General: She is not in acute distress.    Appearance: She is well-developed. She is not diaphoretic.  HENT:     Head: Normocephalic and atraumatic.     Right Ear: Tympanic membrane normal.     Left Ear: Tympanic  membrane normal.     Nose: Nose normal.     Mouth/Throat:     Mouth: Mucous membranes are not dry.     Pharynx: Uvula midline.  Eyes:     General: Lids are normal.     Extraocular Movements:     Right eye: No nystagmus.     Left eye: No nystagmus.     Conjunctiva/sclera: Conjunctivae normal.     Pupils: Pupils are equal, round, and reactive to light.  Neck:     Vascular: Normal carotid pulses. No JVD.     Trachea: Trachea normal. No tracheal deviation.  Cardiovascular:     Rate and Rhythm: Normal rate and regular rhythm.     Pulses: No decreased pulses.          Radial pulses are 2+ on the right side and 2+ on the left side.     Heart  sounds: Normal heart sounds, S1 normal and S2 normal. No murmur heard. Pulmonary:     Effort: Pulmonary effort is normal. No respiratory distress.     Breath sounds: Normal breath sounds. No wheezing, rhonchi or rales.  Chest:     Chest wall: No tenderness.  Abdominal:     General: Bowel sounds are normal.     Palpations: Abdomen is soft.     Tenderness: There is no abdominal tenderness. There is no guarding or rebound.  Musculoskeletal:        General: Normal range of motion.     Cervical back: Normal range of motion and neck supple. No tenderness or bony tenderness. No muscular tenderness.     Right lower leg: No edema.     Left lower leg: No edema.  Skin:    General: Skin is warm and dry.     Coloration: Skin is not pale.     Findings: No rash.  Neurological:     General: No focal deficit present.     Mental Status: She is alert and oriented to person, place, and time. Mental status is at baseline.     GCS: GCS eye subscore is 4. GCS verbal subscore is 5. GCS motor subscore is 6.     Cranial Nerves: No cranial nerve deficit.     Sensory: No sensory deficit.     Motor: No weakness.     Coordination: Coordination normal.     Comments: Upper extremity myotomes tested bilaterally:  C5 Shoulder abduction 5/5 C6 Elbow flexion/wrist extension 5/5 C7 Elbow extension 5/5 C8 Finger flexion 5/5 T1 Finger abduction 5/5  Lower extremity myotomes tested bilaterally: L2 Hip flexion 5/5 L3 Knee extension 5/5 L4 Ankle dorsiflexion 5/5 S1 Ankle plantar flexion 5/5   Psychiatric:        Mood and Affect: Mood normal.     ED Results / Procedures / Treatments   Labs (all labs ordered are listed, but only abnormal results are displayed) Labs Reviewed  BASIC METABOLIC PANEL - Abnormal; Notable for the following components:      Result Value   Potassium 2.7 (*)    Chloride 96 (*)    Glucose, Bld 112 (*)    Creatinine, Ser 1.06 (*)    GFR, Estimated 52 (*)    All other components  within normal limits  CBC - Abnormal; Notable for the following components:   Hemoglobin 15.1 (*)    All other components within normal limits  URINALYSIS, ROUTINE W REFLEX MICROSCOPIC  HEPATIC FUNCTION PANEL  CBG MONITORING, ED  TROPONIN I (  HIGH SENSITIVITY)    ED ECG REPORT   Date: 05/02/2023  Rate: 72  Rhythm: normal sinus rhythm  QRS Axis: normal  Intervals: normal  ST/T Wave abnormalities: nonspecific T wave changes  Conduction Disutrbances:none  Narrative Interpretation:   Old EKG Reviewed: changes noted slightly more flattening t waves since 02/2022  I have personally reviewed the EKG tracing and agree with the computerized printout as noted.   Radiology DG Chest 2 View  Result Date: 05/02/2023 CLINICAL DATA:  Weakness. EXAM: CHEST - 2 VIEW COMPARISON:  Chest radiograph dated March 03, 2022. FINDINGS: The heart size and mediastinal contours are within normal limits. No focal consolidation, pneumothorax, or pleural effusion. No acute osseous abnormality. Multilevel degenerative changes of the thoracic spine. Degenerative changes of the bilateral acromioclavicular joints. IMPRESSION: No active cardiopulmonary disease. Electronically Signed   By: Hart Robinsons M.D.   On: 05/02/2023 15:26    Procedures Procedures    Medications Ordered in ED Medications  potassium chloride SA (KLOR-CON M) CR tablet 40 mEq (40 mEq Oral Given 05/02/23 1332)  sodium chloride 0.9 % bolus 500 mL (500 mLs Intravenous New Bag/Given 05/02/23 1333)    ED Course/ Medical Decision Making/ A&P    Patient seen and examined. History obtained directly from patient. Work-up including labs, imaging, EKG ordered in triage, if performed, were reviewed.    Labs/EKG: Independently reviewed and interpreted.  This included: CBC with normal white blood cell count, mildly elevated hemoglobin at 15.1 otherwise unremarkable; BMP hypokalemia noted at 2.7, creatinine minimally elevated at 1.06, will add on  magnesium.  I have added troponin.  Pending UA.  Added hepatic function panel.  Imaging: I have added chest x-ray to evaluate for generalized weakness  Medications/Fluids: Ordered: Fluid bolus  Most recent vital signs reviewed and are as follows: BP 139/85 (BP Location: Right Arm)   Pulse 80   Temp 98.5 F (36.9 C) (Oral)   Resp 14   Ht 5\' 6"  (1.676 m)   Wt 62.6 kg   SpO2 100%   BMI 22.27 kg/m   Initial impression: Orthostatic dizziness/lightheadedness.  Awaiting orthostatic vital sign check care.  4:42 PM Reassessment performed. Patient appears stable.  She has done well here.  Oral potassium given.  I did speak with patient's daughter at bedside on the telephone.  She has received IV fluids after IV team placed a line.  Labs personally reviewed and interpreted including: Magnesium just slightly high at 2.5; troponin normal at 3; hepatic function panel with minimally elevated AST at 43 otherwise unremarkable.  UA was negative without signs of infection.  Imaging personally visualized and interpreted including: Chest x-ray, agree negative.  Reviewed pertinent lab work and imaging with patient at bedside. Questions answered.   Most current vital signs reviewed and are as follows: BP 121/76   Pulse 69   Temp 98.6 F (37 C) (Oral)   Resp 19   Ht 5\' 6"  (1.676 m)   Wt 62.6 kg   SpO2 100%   BMI 22.27 kg/m   Plan: Discharge to home.  Will place on potassium supplement for 1 week.  Discussed with patient and daughter and recommended that patient check her blood pressure prior to taking her blood pressure medication due to concern for orthostatic hypotension.  She was not severely orthostatic here.  If her blood pressure is greater than 140 systolic or greater than 90 diastolic, I would have her take her blood pressure medication.  Otherwise skip for that day until she  can follow-up with her primary care doctor to determine a plan for treating blood pressure.  Prescriptions written  for: Potassium supplementation  Other home care instructions discussed: Gradual position changes  ED return instructions discussed: Worsening lightheadedness or syncope, any strokelike symptoms, chest pain or shortness of breath  Follow-up instructions discussed: Patient encouraged to follow-up with their PCP in 7 days.                                   Medical Decision Making Amount and/or Complexity of Data Reviewed Labs: ordered. Radiology: ordered.  Risk Prescription drug management.   Patient presented today with transient orthostatic lightheadedness, likely element of hypotension.  Workup does not show an acute cardiac cause today.  No signs of heart failure.  Her potassium is low, possibly due to use of HCTZ.  She may also be getting a little hypotensive with standing.  No strokelike symptoms and I have low concern for CVA or TIA.  She has a reassuring neurologic exam.  Did give a small fluid bolus in the ED.  She looks well.  Strongly encouraged PCP follow-up for blood pressure recheck and potassium recheck.  She will continue blood pressure medication unless her readings are low prior to taking medication.  Additionally can follow-up with her PCP, I would rather her be a little bit high than low.  The patient's vital signs, pertinent lab work and imaging were reviewed and interpreted as discussed in the ED course. Hospitalization was considered for further testing, treatments, or serial exams/observation. However as patient is well-appearing, has a stable exam, and reassuring studies today, I do not feel that they warrant admission at this time. This plan was discussed with the patient who verbalizes agreement and comfort with this plan and seems reliable and able to return to the Emergency Department with worsening or changing symptoms.          Final Clinical Impression(s) / ED Diagnoses Final diagnoses:  Orthostatic lightheadedness  Hypokalemia    Rx / DC Orders ED  Discharge Orders          Ordered    potassium chloride SA (KLOR-CON M) 20 MEQ tablet  2 times daily        05/02/23 1554              Renne Crigler, PA-C 05/02/23 1646    Benjiman Core, MD 05/03/23 [phone]

## 2023-05-02 NOTE — ED Notes (Signed)
PT's daughter request a call for an update  [phone] Diane

## 2023-05-09 ENCOUNTER — Other Ambulatory Visit: Payer: Self-pay | Admitting: *Deleted

## 2023-05-09 DIAGNOSIS — I739 Peripheral vascular disease, unspecified: Secondary | ICD-10-CM

## 2023-05-09 DIAGNOSIS — I8393 Asymptomatic varicose veins of bilateral lower extremities: Secondary | ICD-10-CM

## 2023-05-21 ENCOUNTER — Encounter: Payer: Self-pay | Admitting: Vascular Surgery

## 2023-05-21 ENCOUNTER — Ambulatory Visit: Payer: Medicare HMO | Admitting: Vascular Surgery

## 2023-05-21 ENCOUNTER — Ambulatory Visit (HOSPITAL_COMMUNITY)
Admission: RE | Admit: 2023-05-21 | Discharge: 2023-05-21 | Disposition: A | Discharge status: Home / Self Care | Payer: Medicare HMO | Source: Ambulatory Visit | Attending: Vascular Surgery | Admitting: Vascular Surgery

## 2023-05-21 VITALS — BP 142/88 | HR 75 | Temp 97.2°F | Wt 138.0 lb

## 2023-05-21 DIAGNOSIS — I872 Venous insufficiency (chronic) (peripheral): Secondary | ICD-10-CM | POA: Insufficient documentation

## 2023-05-21 DIAGNOSIS — I739 Peripheral vascular disease, unspecified: Secondary | ICD-10-CM

## 2023-05-21 DIAGNOSIS — I8393 Asymptomatic varicose veins of bilateral lower extremities: Secondary | ICD-10-CM

## 2023-05-21 NOTE — Progress Notes (Signed)
Patient name: [name] MRN: [id number] DOB: 1941-03-15 Sex: female  REASON FOR CONSULT: Varicose veins  HPI: [name] is a 82 y.o. female, with multiple medical problems that presents for evaluation of varicose veins in her right leg.  She states these really are not painful but she has noticed them and is very concerned.  States her mom lost her leg.  She does wear TED hose.  No history of DVT.  Has no prior venous interventions.  Again no significant swelling or pain in the leg.  Past Medical History:  Diagnosis Date   Acute sinus infection 01/15/2011   ANXIETY 10/03/2009   BARRETTS ESOPHAGUS 10/03/2009   CAROTID ARTERY DISEASE 08/31/2009   COLONIC POLYPS, HX OF 08/29/2009   CONSTIPATION, CHRONIC 10/03/2009   DEPRESSION 10/03/2009   DISC DISEASE, CERVICAL 08/29/2009   DISC DISEASE, LUMBAR 08/29/2009   Eustachian tube dysfunction 01/15/2011   GERD 08/29/2009   HYPERLIPIDEMIA 08/29/2009   Impaired glucose tolerance 01/14/2011   LEG PAIN, BILATERAL 08/29/2009   LOW BACK PAIN, CHRONIC 11/22/2009   PERIPHERAL NEUROPATHY 10/03/2009   PERIPHERAL VASCULAR DISEASE 08/29/2009   POSITIVE PPD 10/03/2009   Preventative health care 01/14/2011   Stroke (HCC)    Thyroid nodule 08/09/2011   TIA 11/22/2009   TRANSIENT ISCHEMIC ATTACK, HX OF 08/29/2009   URETHRAL STRICTURE 10/03/2009   VOCAL CORD POLYP, HX OF 10/03/2009   WRIST PAIN, BILATERAL 05/25/2010    Past Surgical History:  Procedure Laterality Date   hx of right ankle fracture     s/p vocal cord polyps  1984   chronic hoarseness   stress test negative  2003,2004,2006   TUBAL LIGATION     tubaligation     uterine prolapse      Family History  Problem Relation Age of Onset   Stroke Mother    Heart disease Mother    Hypertension Mother    Diabetes Mother    Multiple myeloma Sister    Cancer Other        lung    SOCIAL HISTORY: Social History   Socioeconomic History   Marital status: Widowed    Spouse name: Not on file    Number of children: 3   Years of education: Not on file   Highest education level: Not on file  Occupational History   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  Tobacco Use   Smoking status: Never   Smokeless tobacco: Never  Vaping Use   Vaping status: Never Used  Substance and Sexual Activity   Alcohol use: No   Drug use: No   Sexual activity: Not Currently  Other Topics Concern   Not on file  Social History Narrative   Husband with dementia   Social Determinants of Health   Financial Resource Strain: Not on file  Food Insecurity: Not on file  Transportation Needs: Not on file  Physical Activity: Not on file  Stress: Not on file  Social Connections: Not on file  Intimate Partner Violence: Not on file    Allergies  Allergen Reactions   Atorvastatin     REACTION: myalgia   Penicillin G     Other reaction(s): swelling   Penicillins Hives and Itching    Has patient had a PCN reaction causing immediate rash, facial/tongue/throat swelling, SOB or lightheadedness with hypotension: no Has patient had a PCN reaction causing severe rash involving mucus membranes or skin necrosis: no Has patient had a PCN reaction  rhythm.  VASCULAR:  Bilateral common femoral  pulses palpable Bilateral PT pulses palpable Notable varicose veins in the right lower extremity with no skin changes PULMONARY: No respiratory distress. ABDOMEN: Soft and non-tender. MUSCULOSKELETAL: There are no major deformities or cyanosis. NEUROLOGIC: No focal weakness or paresthesias are detected. SKIN: There are no ulcers or rashes noted. PSYCHIATRIC: The patient has a normal affect.  DATA:   Lower Venous Reflux Study   Patient Name:  [name]  Date of Exam:   05/21/2023  Medical Rec #: [id number]             Accession #:    [id number]  Date of Birth: 1940-10-09            Patient Gender: F  Patient Age:   11 years  Exam Location:  [facility]  Procedure:      VAS [place] LOWER EXTREMITY VENOUS REFLUX  Referring Phys: Sherald Hess    ---------------------------------------------------------------------------  -----    Indications: Varicosities.    Comparison Study: No prior study   Performing Technologist: Gertie Fey MHA, RDMS, RVT, RDCS     Examination Guidelines: A complete evaluation includes B-mode imaging,  spectral  Doppler, color Doppler, and power Doppler as needed of all accessible  portions  of each vessel. Bilateral testing is considered an integral part of a  complete  examination. Limited examinations for reoccurring indications may be  performed  as noted. The reflux portion of the exam is performed with the patient in  reverse Trendelenburg.  Significant venous reflux is defined as >500 ms in the superficial venous  system, and >1 second in the deep venous system.     Venous Reflux Times  +--------------+---------+------+-----------+------------+--------+  RIGHT        Reflux NoRefluxReflux TimeDiameter cmsComments                          Yes                                   +--------------+---------+------+-----------+------------+--------+  CFV                    yes   >1 second                        +--------------+---------+------+-----------+------------+--------+  FV mid        no                                              +--------------+---------+------+-----------+------------+--------+  Popliteal    no                                              +--------------+---------+------+-----------+------------+--------+  GSV at SFJ              yes    >500 ms      0.55              +--------------+---------+------+-----------+------------+--------+  GSV prox thigh          yes    >500 ms      0.63              +--------------+---------+------+-----------+------------+--------+  Patient name: [name] MRN: [id number] DOB: 1941-03-15 Sex: female  REASON FOR CONSULT: Varicose veins  HPI: [name] is a 82 y.o. female, with multiple medical problems that presents for evaluation of varicose veins in her right leg.  She states these really are not painful but she has noticed them and is very concerned.  States her mom lost her leg.  She does wear TED hose.  No history of DVT.  Has no prior venous interventions.  Again no significant swelling or pain in the leg.  Past Medical History:  Diagnosis Date   Acute sinus infection 01/15/2011   ANXIETY 10/03/2009   BARRETTS ESOPHAGUS 10/03/2009   CAROTID ARTERY DISEASE 08/31/2009   COLONIC POLYPS, HX OF 08/29/2009   CONSTIPATION, CHRONIC 10/03/2009   DEPRESSION 10/03/2009   DISC DISEASE, CERVICAL 08/29/2009   DISC DISEASE, LUMBAR 08/29/2009   Eustachian tube dysfunction 01/15/2011   GERD 08/29/2009   HYPERLIPIDEMIA 08/29/2009   Impaired glucose tolerance 01/14/2011   LEG PAIN, BILATERAL 08/29/2009   LOW BACK PAIN, CHRONIC 11/22/2009   PERIPHERAL NEUROPATHY 10/03/2009   PERIPHERAL VASCULAR DISEASE 08/29/2009   POSITIVE PPD 10/03/2009   Preventative health care 01/14/2011   Stroke (HCC)    Thyroid nodule 08/09/2011   TIA 11/22/2009   TRANSIENT ISCHEMIC ATTACK, HX OF 08/29/2009   URETHRAL STRICTURE 10/03/2009   VOCAL CORD POLYP, HX OF 10/03/2009   WRIST PAIN, BILATERAL 05/25/2010    Past Surgical History:  Procedure Laterality Date   hx of right ankle fracture     s/p vocal cord polyps  1984   chronic hoarseness   stress test negative  2003,2004,2006   TUBAL LIGATION     tubaligation     uterine prolapse      Family History  Problem Relation Age of Onset   Stroke Mother    Heart disease Mother    Hypertension Mother    Diabetes Mother    Multiple myeloma Sister    Cancer Other        lung    SOCIAL HISTORY: Social History   Socioeconomic History   Marital status: Widowed    Spouse name: Not on file    Number of children: 3   Years of education: Not on file   Highest education level: Not on file  Occupational History   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  Tobacco Use   Smoking status: Never   Smokeless tobacco: Never  Vaping Use   Vaping status: Never Used  Substance and Sexual Activity   Alcohol use: No   Drug use: No   Sexual activity: Not Currently  Other Topics Concern   Not on file  Social History Narrative   Husband with dementia   Social Determinants of Health   Financial Resource Strain: Not on file  Food Insecurity: Not on file  Transportation Needs: Not on file  Physical Activity: Not on file  Stress: Not on file  Social Connections: Not on file  Intimate Partner Violence: Not on file    Allergies  Allergen Reactions   Atorvastatin     REACTION: myalgia   Penicillin G     Other reaction(s): swelling   Penicillins Hives and Itching    Has patient had a PCN reaction causing immediate rash, facial/tongue/throat swelling, SOB or lightheadedness with hypotension: no Has patient had a PCN reaction causing severe rash involving mucus membranes or skin necrosis: no Has patient had a PCN reaction  rhythm.  VASCULAR:  Bilateral common femoral  pulses palpable Bilateral PT pulses palpable Notable varicose veins in the right lower extremity with no skin changes PULMONARY: No respiratory distress. ABDOMEN: Soft and non-tender. MUSCULOSKELETAL: There are no major deformities or cyanosis. NEUROLOGIC: No focal weakness or paresthesias are detected. SKIN: There are no ulcers or rashes noted. PSYCHIATRIC: The patient has a normal affect.  DATA:   Lower Venous Reflux Study   Patient Name:  [name]  Date of Exam:   05/21/2023  Medical Rec #: [id number]             Accession #:    [id number]  Date of Birth: 1940-10-09            Patient Gender: F  Patient Age:   11 years  Exam Location:  [facility]  Procedure:      VAS [place] LOWER EXTREMITY VENOUS REFLUX  Referring Phys: Sherald Hess    ---------------------------------------------------------------------------  -----    Indications: Varicosities.    Comparison Study: No prior study   Performing Technologist: Gertie Fey MHA, RDMS, RVT, RDCS     Examination Guidelines: A complete evaluation includes B-mode imaging,  spectral  Doppler, color Doppler, and power Doppler as needed of all accessible  portions  of each vessel. Bilateral testing is considered an integral part of a  complete  examination. Limited examinations for reoccurring indications may be  performed  as noted. The reflux portion of the exam is performed with the patient in  reverse Trendelenburg.  Significant venous reflux is defined as >500 ms in the superficial venous  system, and >1 second in the deep venous system.     Venous Reflux Times  +--------------+---------+------+-----------+------------+--------+  RIGHT        Reflux NoRefluxReflux TimeDiameter cmsComments                          Yes                                   +--------------+---------+------+-----------+------------+--------+  CFV                    yes   >1 second                        +--------------+---------+------+-----------+------------+--------+  FV mid        no                                              +--------------+---------+------+-----------+------------+--------+  Popliteal    no                                              +--------------+---------+------+-----------+------------+--------+  GSV at SFJ              yes    >500 ms      0.55              +--------------+---------+------+-----------+------------+--------+  GSV prox thigh          yes    >500 ms      0.63              +--------------+---------+------+-----------+------------+--------+

## 2023-08-23 DIAGNOSIS — I700 Atherosclerosis of aorta: Secondary | ICD-10-CM | POA: Diagnosis not present

## 2023-08-23 DIAGNOSIS — E785 Hyperlipidemia, unspecified: Secondary | ICD-10-CM | POA: Diagnosis not present

## 2023-08-23 DIAGNOSIS — Z1211 Encounter for screening for malignant neoplasm of colon: Secondary | ICD-10-CM | POA: Diagnosis not present

## 2023-08-23 DIAGNOSIS — I10 Essential (primary) hypertension: Secondary | ICD-10-CM | POA: Diagnosis not present

## 2023-08-23 DIAGNOSIS — Z0001 Encounter for general adult medical examination with abnormal findings: Secondary | ICD-10-CM | POA: Diagnosis not present

## 2023-08-23 DIAGNOSIS — Z1231 Encounter for screening mammogram for malignant neoplasm of breast: Secondary | ICD-10-CM | POA: Diagnosis not present

## 2023-08-23 DIAGNOSIS — R413 Other amnesia: Secondary | ICD-10-CM | POA: Diagnosis not present

## 2023-08-23 DIAGNOSIS — Z13820 Encounter for screening for osteoporosis: Secondary | ICD-10-CM | POA: Diagnosis not present

## 2023-08-23 DIAGNOSIS — I6529 Occlusion and stenosis of unspecified carotid artery: Secondary | ICD-10-CM | POA: Diagnosis not present

## 2023-08-23 DIAGNOSIS — Z9181 History of falling: Secondary | ICD-10-CM | POA: Diagnosis not present

## 2023-08-23 DIAGNOSIS — Z23 Encounter for immunization: Secondary | ICD-10-CM | POA: Diagnosis not present

## 2023-08-26 ENCOUNTER — Other Ambulatory Visit: Payer: Self-pay | Admitting: Internal Medicine

## 2023-08-26 DIAGNOSIS — Z0000 Encounter for general adult medical examination without abnormal findings: Secondary | ICD-10-CM

## 2023-08-26 DIAGNOSIS — M810 Age-related osteoporosis without current pathological fracture: Secondary | ICD-10-CM

## 2023-09-11 ENCOUNTER — Ambulatory Visit: Payer: Medicare Other

## 2023-09-26 ENCOUNTER — Ambulatory Visit
Admission: RE | Admit: 2023-09-26 | Discharge: 2023-09-26 | Disposition: A | Discharge status: Home / Self Care | Payer: Medicare Other | Source: Ambulatory Visit | Attending: Internal Medicine | Admitting: Internal Medicine

## 2023-09-26 DIAGNOSIS — Z0000 Encounter for general adult medical examination without abnormal findings: Secondary | ICD-10-CM

## 2023-09-26 DIAGNOSIS — Z1231 Encounter for screening mammogram for malignant neoplasm of breast: Secondary | ICD-10-CM | POA: Diagnosis not present

## 2023-10-09 ENCOUNTER — Telehealth (HOSPITAL_COMMUNITY): Payer: Self-pay | Admitting: Emergency Medicine

## 2023-10-09 ENCOUNTER — Ambulatory Visit (HOSPITAL_COMMUNITY)
Admission: EM | Admit: 2023-10-09 | Discharge: 2023-10-09 | Disposition: A | Discharge status: Home / Self Care | Attending: Emergency Medicine | Admitting: Emergency Medicine

## 2023-10-09 ENCOUNTER — Encounter (HOSPITAL_COMMUNITY): Payer: Self-pay

## 2023-10-09 DIAGNOSIS — J069 Acute upper respiratory infection, unspecified: Secondary | ICD-10-CM | POA: Diagnosis not present

## 2023-10-09 LAB — RESP PANEL BY RT-PCR (RSV, FLU A&B, COVID)  RVPGX2
Influenza A by PCR: NEGATIVE
Influenza B by PCR: NEGATIVE
Resp Syncytial Virus by PCR: NEGATIVE
SARS Coronavirus 2 by RT PCR: POSITIVE — AB

## 2023-10-09 LAB — POCT INFLUENZA A/B
Influenza A, POC: NEGATIVE
Influenza B, POC: NEGATIVE

## 2023-10-09 NOTE — ED Triage Notes (Signed)
 Patient reports that she has had a fever, body aches, nasal congestion, and a headache since yesterday.  Patient states she has not had any medications for her symptoms.

## 2023-10-09 NOTE — ED Provider Notes (Signed)
 [facility]    CSN: [id number] Arrival date & time: 10/09/23  1241      History   Chief Complaint Chief Complaint  Patient presents with   Fever   Generalized Body Aches   Nasal Congestion   Headache    HPI [name] is a 83 y.o. female.   Patient presents to clinic over concerns of high fever, generalized bodyaches, nasal congestion, rhinorrhea and headache that started yesterday.  Temperature was 102.8 prior to arrival at clinic, she took Tylenol for this.  Patient drove herself here to clinic.  She ambulates with a cane.  She is pretty sure she had her seasonal flu vaccine.  She did go to a conference over the weekend and was around a few 100 people.  She is having a mild cough that is nonproductive.  No wheezing or shortness of breath.  Without vomiting or diarrhea.  The history is provided by the patient and medical records.  Fever Headache   Past Medical History:  Diagnosis Date   Acute sinus infection 01/15/2011   ANXIETY 10/03/2009   BARRETTS ESOPHAGUS 10/03/2009   CAROTID ARTERY DISEASE 08/31/2009   COLONIC POLYPS, HX OF 08/29/2009   CONSTIPATION, CHRONIC 10/03/2009   DEPRESSION 10/03/2009   DISC DISEASE, CERVICAL 08/29/2009   DISC DISEASE, LUMBAR 08/29/2009   Eustachian tube dysfunction 01/15/2011   GERD 08/29/2009   HYPERLIPIDEMIA 08/29/2009   Impaired glucose tolerance 01/14/2011   LEG PAIN, BILATERAL 08/29/2009   LOW BACK PAIN, CHRONIC 11/22/2009   PERIPHERAL NEUROPATHY 10/03/2009   PERIPHERAL VASCULAR DISEASE 08/29/2009   POSITIVE PPD 10/03/2009   Preventative health care 01/14/2011   Stroke (HCC)    Thyroid nodule 08/09/2011   TIA 11/22/2009   TRANSIENT ISCHEMIC ATTACK, HX OF 08/29/2009   URETHRAL STRICTURE 10/03/2009   VOCAL CORD POLYP, HX OF 10/03/2009   WRIST PAIN, BILATERAL 05/25/2010    Patient Active Problem List   Diagnosis Date Noted   Chronic venous insufficiency 05/21/2023   Chest pain 03/04/2022   COVID-19 virus infection 03/04/2022    Elevated troponin 03/04/2022   Demand ischemia (HCC) 03/04/2022   Dizziness 08/14/2017   Chronic swimmer's ear of both sides 12/13/2016   Acute left hemiparesis (HCC)    TIA (transient ischemic attack) 06/01/2016   CVA (cerebral vascular accident) (HCC) 06/01/2016   Transient cerebral ischemia    Pelvic prolapse 11/03/2014   Cystocele 09/29/2014   SUI (stress urinary incontinence, female) 09/29/2014   Complicated migraine 08/22/2014   Multinodular goiter 08/09/2011   Lumbar pain with radiation down right leg 04/11/2011   Rash 04/11/2011   Impaired glucose tolerance 01/14/2011   Preventative health care 01/14/2011   WRIST PAIN, BILATERAL 05/25/2010   Transient cerebral ischemia 11/22/2009   LOW BACK PAIN, CHRONIC 11/22/2009   Anxiety state 10/03/2009   DEPRESSION 10/03/2009   Hereditary and idiopathic peripheral neuropathy 10/03/2009   BARRETTS ESOPHAGUS 10/03/2009   CONSTIPATION, CHRONIC 10/03/2009   Urethral stricture 10/03/2009   POSITIVE PPD 10/03/2009   VOCAL CORD POLYP, HX OF 10/03/2009   Carotid artery disease (HCC) 08/31/2009   Hyperlipemia 08/29/2009   PERIPHERAL VASCULAR DISEASE 08/29/2009   GERD 08/29/2009   DISC DISEASE, CERVICAL 08/29/2009   DISC DISEASE, LUMBAR 08/29/2009   LEG PAIN, BILATERAL 08/29/2009   History of cardiovascular disorder 08/29/2009   History of colonic polyps 08/29/2009    Past Surgical History:  Procedure Laterality Date   hx of right ankle fracture     s/p vocal cord polyps  1984   chronic hoarseness   stress test negative  2003,2004,2006   TUBAL LIGATION     tubaligation     uterine prolapse      OB History   No obstetric history on file.      Home Medications    Prior to Admission medications   Medication Sig Start Date End Date Taking? Authorizing Provider  acetaminophen (TYLENOL) 650 MG CR tablet Take 650 mg by mouth daily as needed for pain.    [provider]  alendronate (FOSAMAX) 70 MG tablet Take 70 mg  by mouth once a week. 09/30/20   [provider]  aspirin EC 81 MG tablet Take 81 mg by mouth daily. 12/13/16   [provider]  Coenzyme Q10 (CO Q 10 PO) Take 1 tablet by mouth daily.    [provider]  hydrochlorothiazide (HYDRODIURIL) 25 MG tablet Take 0.5 tablets (12.5 mg total) by mouth daily. Patient taking differently: Take 12.5 mg by mouth 2 (two) times daily. 01/16/23 05/02/23  Smitty Knudsen, PA-C  memantine (NAMENDA) 5 MG tablet Take 5 mg by mouth 2 (two) times daily. 02/12/23   [provider]  Multiple Vitamins-Minerals (MULTIVITAMIN WITH MINERALS) tablet Take 1 tablet by mouth daily.    [provider]  naproxen (NAPROSYN) 375 MG tablet Take 375 mg by mouth 2 (two) times daily with a meal. 11/03/14   [provider]  potassium chloride SA (KLOR-CON M) 20 MEQ tablet Take 1 tablet (20 mEq total) by mouth 2 (two) times daily. 05/02/23   Renne Crigler, PA-C  rosuvastatin (CRESTOR) 40 MG tablet Take 40 mg by mouth daily.    [provider]    Family History Family History  Problem Relation Age of Onset   Stroke Mother    Heart disease Mother    Hypertension Mother    Diabetes Mother    Multiple myeloma Sister    Cancer Other        lung    Social History Social History   Tobacco Use   Smoking status: Never   Smokeless tobacco: Never  Vaping Use   Vaping status: Never Used  Substance Use Topics   Alcohol use: No   Drug use: No     Allergies   Atorvastatin, Penicillin g, and Penicillins   Review of Systems Review of Systems  Per HPI   Physical Exam Triage Vital Signs ED Triage Vitals [10/09/23 1412]  Encounter Vitals Group     BP (!) 168/79     Systolic BP Percentile      Diastolic BP Percentile      Pulse Rate 81     Resp 16     Temp 99.8 F (37.7 C)     Temp Source Oral     SpO2 98 %     Weight      Height      Head Circumference      Peak Flow      Pain Score 8     Pain Loc      Pain  Education      Exclude from Growth Chart    No data found.  Updated Vital Signs BP (!) 168/79 (BP Location: Left Arm)   Pulse 81   Temp 99.8 F (37.7 C) (Oral)   Resp 16   SpO2 98%   Visual Acuity Right Eye Distance:   Left Eye Distance:   Bilateral Distance:    Right Eye Near:  Left Eye Near:    Bilateral Near:     Physical Exam Vitals and nursing note reviewed.  Constitutional:      Appearance: Normal appearance. She is well-developed.  HENT:     Head: Normocephalic and atraumatic.     Right Ear: External ear normal.     Left Ear: External ear normal.     Nose: Congestion and rhinorrhea present.     Mouth/Throat:     Mouth: Mucous membranes are moist.  Eyes:     Conjunctiva/sclera: Conjunctivae normal.  Cardiovascular:     Rate and Rhythm: Normal rate and regular rhythm.     Heart sounds: Normal heart sounds. No murmur heard. Pulmonary:     Effort: Pulmonary effort is normal. No respiratory distress.     Breath sounds: Normal breath sounds. No wheezing.  Skin:    General: Skin is warm and dry.  Neurological:     General: No focal deficit present.     Mental Status: She is alert.  Psychiatric:        Mood and Affect: Mood normal.      UC Treatments / Results  Labs (all labs ordered are listed, but only abnormal results are displayed) Labs Reviewed  RESP PANEL BY RT-PCR (RSV, FLU A&B, COVID)  [id number]  POCT INFLUENZA A/B    EKG   Radiology No results found.  Procedures Procedures (including critical care time)  Medications Ordered in UC Medications - No data to display  Initial Impression / Assessment and Plan / UC Course  I have reviewed the triage vital signs and the nursing notes.  Pertinent labs & imaging results that were available during my care of the patient were reviewed by me and considered in my medical decision making (see chart for details).  Vitals and triage reviewed, patient is hemodynamically stable.  Lungs are vesicular,  heart with regular rate and rhythm.  Congestion and rhinorrhea present on physical exam as well as postnasal drip.  POC testing negative for influenza.  Respiratory panel with COVID, flu and RSV obtained due to high risk with age and comorbid conditions.  Symptomatic management for viral illness encouraged.  Staff will contact if panel is abnormal.  Plan of care, follow-up care return precautions given, no questions at this time.     Final Clinical Impressions(s) / UC Diagnoses   Final diagnoses:  Viral URI with cough     Discharge Instructions      Your rapid flu testing was negative, we are checking you for COVID and RSV with a send out swab.  Our staff will contact you if this result is abnormal.  In the meantime you can take 500 mg of Tylenol every 6-8 hours for fever, body aches and chills. 1200 mg of mucinex daily can help loosen congestion, staying hydrated with at least 64 ounces of water and sleeping with a humidifier may help as well.   Follow-up with your primary care provider or return to clinic for any new or concerning symptoms, or if no improvement over the next 7 days or so.      ED Prescriptions   None    PDMP not reviewed this encounter.   Bienvenido Proehl, [place], [place] 10/09/23 [phone]

## 2023-10-09 NOTE — Discharge Instructions (Addendum)
 Your rapid flu testing was negative, we are checking you for COVID and RSV with a send out swab.  Our staff will contact you if this result is abnormal.  In the meantime you can take 500 mg of Tylenol every 6-8 hours for fever, body aches and chills. 1200 mg of mucinex daily can help loosen congestion, staying hydrated with at least 64 ounces of water and sleeping with a humidifier may help as well.   Follow-up with your primary care provider or return to clinic for any new or concerning symptoms, or if no improvement over the next 7 days or so.

## 2023-10-09 NOTE — Telephone Encounter (Signed)
 Called to discuss positive Covid-19 results, no response. VM left.   Would like to start renal dose paxlovid if patient is agreeable d/t age. Cr 1.06 and EGFR 52 5 months ago. Would have to stop rosuvastatin.

## 2023-10-10 ENCOUNTER — Telehealth (HOSPITAL_COMMUNITY): Payer: Self-pay

## 2023-10-10 ENCOUNTER — Other Ambulatory Visit (HOSPITAL_COMMUNITY): Payer: Self-pay

## 2023-10-10 DIAGNOSIS — U071 COVID-19: Secondary | ICD-10-CM | POA: Diagnosis not present

## 2023-10-10 DIAGNOSIS — I10 Essential (primary) hypertension: Secondary | ICD-10-CM | POA: Diagnosis not present

## 2023-10-10 MED ORDER — PAXLOVID (150/100) 10 X 150 MG & 10 X 100MG PO TBPK: 2.0000 | ORAL_TABLET | Freq: Two times a day (BID) | ORAL | 0 refills | Status: DC

## 2023-10-10 MED ORDER — PAXLOVID (150/100) 10 X 150 MG & 10 X 100MG PO TBPK: 2.0000 | ORAL_TABLET | Freq: Two times a day (BID) | ORAL | 0 refills | Status: AC

## 2023-10-10 MED ORDER — PAXLOVID (150/100) 10 X 150 MG & 10 X 100MG PO TBPK
2.0000 | ORAL_TABLET | Freq: Two times a day (BID) | ORAL | 0 refills | Status: DC
  Filled 2023-10-10: qty 20, 5d supply, fill #0

## 2023-10-10 NOTE — Telephone Encounter (Signed)
 Pt called stating the Paxlovid was too expensive. Asking for an alternative. Advised pt that would speak with provider, however there may not be an alternative that her insurance will cover.  Please advise. Thank you

## 2023-10-10 NOTE — Telephone Encounter (Signed)
 Per phone note by Harle Stanford, FNP, "Called to discuss positive Covid-19 results, no response. VM left. Would like to start renal dose paxlovid if patient is agreeable d/t age. Cr 1.06 and EGFR 52 5 months ago. Would have to stop rosuvastatin." This writer contacted pt to advise of results and meds. Pt initially asked to have meds sent to her. Spoke with staff at [facility] to have meds sent. Stated would be next day. When advised pt of this, pt states she would prefer to have someone pick the meds up today at [facility] on [address]. E-scripe failed x2 attempts. Verbal order given to pharmacist.

## 2023-10-11 NOTE — Telephone Encounter (Signed)
 TC to pt. Advised of symptomatic treatment. Explained that Paxlovid is not an antibiotic, and that the virus will pass on its own. Advised of ED precautions. Verbalized understanding.

## 2023-10-11 NOTE — Telephone Encounter (Signed)
 Attempted to reach patient x1. LVM.

## 2023-10-14 DIAGNOSIS — I10 Essential (primary) hypertension: Secondary | ICD-10-CM | POA: Diagnosis not present

## 2023-10-14 DIAGNOSIS — I8390 Asymptomatic varicose veins of unspecified lower extremity: Secondary | ICD-10-CM | POA: Diagnosis not present

## 2023-10-14 DIAGNOSIS — Z7689 Persons encountering health services in other specified circumstances: Secondary | ICD-10-CM | POA: Diagnosis not present

## 2023-10-14 DIAGNOSIS — U071 COVID-19: Secondary | ICD-10-CM | POA: Diagnosis not present

## 2023-10-14 DIAGNOSIS — I872 Venous insufficiency (chronic) (peripheral): Secondary | ICD-10-CM | POA: Diagnosis not present

## 2023-10-15 ENCOUNTER — Emergency Department (HOSPITAL_COMMUNITY)

## 2023-10-15 ENCOUNTER — Encounter (HOSPITAL_COMMUNITY): Payer: Self-pay | Admitting: Emergency Medicine

## 2023-10-15 ENCOUNTER — Emergency Department (HOSPITAL_COMMUNITY)
Admission: EM | Admit: 2023-10-15 | Discharge: 2023-10-16 | Disposition: A | Discharge status: Home / Self Care | Attending: Emergency Medicine | Admitting: Emergency Medicine

## 2023-10-15 ENCOUNTER — Other Ambulatory Visit: Payer: Self-pay

## 2023-10-15 DIAGNOSIS — Z7982 Long term (current) use of aspirin: Secondary | ICD-10-CM | POA: Insufficient documentation

## 2023-10-15 DIAGNOSIS — R030 Elevated blood-pressure reading, without diagnosis of hypertension: Secondary | ICD-10-CM | POA: Diagnosis not present

## 2023-10-15 DIAGNOSIS — Z8673 Personal history of transient ischemic attack (TIA), and cerebral infarction without residual deficits: Secondary | ICD-10-CM | POA: Diagnosis not present

## 2023-10-15 DIAGNOSIS — R0602 Shortness of breath: Secondary | ICD-10-CM | POA: Diagnosis not present

## 2023-10-15 DIAGNOSIS — U071 COVID-19: Secondary | ICD-10-CM | POA: Insufficient documentation

## 2023-10-15 DIAGNOSIS — R059 Cough, unspecified: Secondary | ICD-10-CM | POA: Diagnosis present

## 2023-10-15 LAB — CBC
HCT: 45.8 % (ref 36.0–46.0)
Hemoglobin: 14.3 g/dL (ref 12.0–15.0)
MCH: 30.8 pg (ref 26.0–34.0)
MCHC: 31.2 g/dL (ref 30.0–36.0)
MCV: 98.5 fL (ref 80.0–100.0)
Platelets: 161 10*3/uL (ref 150–400)
RBC: 4.65 MIL/uL (ref 3.87–5.11)
RDW: 13.6 % (ref 11.5–15.5)
WBC: 4.3 10*3/uL (ref 4.0–10.5)
nRBC: 0 % (ref 0.0–0.2)

## 2023-10-15 LAB — COMPREHENSIVE METABOLIC PANEL
ALT: 37 U/L (ref 0–44)
AST: 45 U/L — ABNORMAL HIGH (ref 15–41)
Albumin: 3.9 g/dL (ref 3.5–5.0)
Alkaline Phosphatase: 41 U/L (ref 38–126)
Anion gap: 9 (ref 5–15)
BUN: 9 mg/dL (ref 8–23)
CO2: 28 mmol/L (ref 22–32)
Calcium: 9.8 mg/dL (ref 8.9–10.3)
Chloride: 105 mmol/L (ref 98–111)
Creatinine, Ser: 0.73 mg/dL (ref 0.44–1.00)
GFR, Estimated: >60 mL/min (ref >60)
Glucose, Bld: 106 mg/dL — ABNORMAL HIGH (ref 70–99)
Potassium: 3.3 mmol/L — ABNORMAL LOW (ref 3.5–5.1)
Sodium: 142 mmol/L (ref 135–145)
Total Bilirubin: 0.5 mg/dL (ref 0.0–1.2)
Total Protein: 7.1 g/dL (ref 6.5–8.1)

## 2023-10-15 LAB — RESP PANEL BY RT-PCR (RSV, FLU A&B, COVID)  RVPGX2
Influenza A by PCR: NEGATIVE
Influenza B by PCR: NEGATIVE
Resp Syncytial Virus by PCR: NEGATIVE
SARS Coronavirus 2 by RT PCR: POSITIVE — AB

## 2023-10-15 NOTE — ED Triage Notes (Signed)
 Patient c/o Flu like symptoms x 1 week.Patient report she was positive for COVID last week and patient felt she's getting worse. Patient report pounding headache and she's been taking tylenol and advil PRN without relief. Patient denies N/V. Patient denies fever.

## 2023-10-16 MED ORDER — ACETAMINOPHEN 325 MG PO TABS
650.0000 mg | ORAL_TABLET | Freq: Once | ORAL | Status: AC
  Administered 2023-10-16: 650 mg via ORAL
  Filled 2023-10-16: qty 2

## 2023-10-16 NOTE — Discharge Instructions (Signed)
 Thank you for allowing [place] to take care of you today.  We hope you begin feeling better soon.  To-Do: Please check your blood pressures 2-3 times a day at the same time every day and keep track of your readings.  Do this for 1 to 2 weeks and follow-up closely with your primary care provider. Please return to the Emergency Department or call 911 if you experience chest pain, shortness of breath, severe pain, severe fever, altered mental status, or have any reason to think that you need emergency medical care.  Thank you again.  Hope you feel better soon.  Department of Emergency Medicine [facility]

## 2023-10-16 NOTE — ED Provider Notes (Signed)
 [facility] AT [facility] Provider Note  CSN: [id number] Arrival date & time: 10/15/23 1905  Chief Complaint(s) Flu like symptoms  HPI [name] is a 83 y.o. female with a past medical history listed below who was recently diagnosed with COVID infection 1 week ago presents for elevated blood pressure readings.  She noted that her blood pressure was in the 170s earlier today, she rechecked it in the evening and noted that it was in the 150s.  She denied any associated chest pain and no shortness of breath.  No lower extremity edema.  Patient has been having intermittent mild headaches related to her COVID infection and still has a mild cough.  No vomiting.  No abdominal pain.  No diarrhea.  Patient reports that she still hydrating well.  The history is provided by the patient.    Past Medical History Past Medical History:  Diagnosis Date   Acute sinus infection 01/15/2011   ANXIETY 10/03/2009   BARRETTS ESOPHAGUS 10/03/2009   CAROTID ARTERY DISEASE 08/31/2009   COLONIC POLYPS, HX OF 08/29/2009   CONSTIPATION, CHRONIC 10/03/2009   DEPRESSION 10/03/2009   DISC DISEASE, CERVICAL 08/29/2009   DISC DISEASE, LUMBAR 08/29/2009   Eustachian tube dysfunction 01/15/2011   GERD 08/29/2009   HYPERLIPIDEMIA 08/29/2009   Impaired glucose tolerance 01/14/2011   LEG PAIN, BILATERAL 08/29/2009   LOW BACK PAIN, CHRONIC 11/22/2009   PERIPHERAL NEUROPATHY 10/03/2009   PERIPHERAL VASCULAR DISEASE 08/29/2009   POSITIVE PPD 10/03/2009   Preventative health care 01/14/2011   Stroke (HCC)    Thyroid nodule 08/09/2011   TIA 11/22/2009   TRANSIENT ISCHEMIC ATTACK, HX OF 08/29/2009   URETHRAL STRICTURE 10/03/2009   VOCAL CORD POLYP, HX OF 10/03/2009   WRIST PAIN, BILATERAL 05/25/2010   Patient Active Problem List   Diagnosis Date Noted   Chronic venous insufficiency 05/21/2023   Chest pain 03/04/2022   COVID-19 virus infection 03/04/2022   Elevated troponin 03/04/2022   Demand ischemia  (HCC) 03/04/2022   Dizziness 08/14/2017   Chronic swimmer's ear of both sides 12/13/2016   Acute left hemiparesis (HCC)    TIA (transient ischemic attack) 06/01/2016   CVA (cerebral vascular accident) (HCC) 06/01/2016   Transient cerebral ischemia    Pelvic prolapse 11/03/2014   Cystocele 09/29/2014   SUI (stress urinary incontinence, female) 09/29/2014   Complicated migraine 08/22/2014   Multinodular goiter 08/09/2011   Lumbar pain with radiation down right leg 04/11/2011   Rash 04/11/2011   Impaired glucose tolerance 01/14/2011   Preventative health care 01/14/2011   WRIST PAIN, BILATERAL 05/25/2010   Transient cerebral ischemia 11/22/2009   LOW BACK PAIN, CHRONIC 11/22/2009   Anxiety state 10/03/2009   DEPRESSION 10/03/2009   Hereditary and idiopathic peripheral neuropathy 10/03/2009   BARRETTS ESOPHAGUS 10/03/2009   CONSTIPATION, CHRONIC 10/03/2009   Urethral stricture 10/03/2009   POSITIVE PPD 10/03/2009   VOCAL CORD POLYP, HX OF 10/03/2009   Carotid artery disease (HCC) 08/31/2009   Hyperlipemia 08/29/2009   PERIPHERAL VASCULAR DISEASE 08/29/2009   GERD 08/29/2009   DISC DISEASE, CERVICAL 08/29/2009   DISC DISEASE, LUMBAR 08/29/2009   LEG PAIN, BILATERAL 08/29/2009   History of cardiovascular disorder 08/29/2009   History of colonic polyps 08/29/2009   Home Medication(s) Prior to Admission medications   Medication Sig Start Date End Date Taking? Authorizing Provider  acetaminophen (TYLENOL) 650 MG CR tablet Take 650 mg by mouth daily as needed for pain.    [provider]  alendronate (FOSAMAX)  70 MG tablet Take 70 mg by mouth once a week. 09/30/20   [provider]  aspirin EC 81 MG tablet Take 81 mg by mouth daily. 12/13/16   [provider]  Coenzyme Q10 (CO Q 10 PO) Take 1 tablet by mouth daily.    [provider]  hydrochlorothiazide (HYDRODIURIL) 25 MG tablet Take 0.5 tablets (12.5 mg total) by mouth daily. Patient taking  differently: Take 12.5 mg by mouth 2 (two) times daily. 01/16/23 05/02/23  Smitty Knudsen, PA-C  memantine (NAMENDA) 5 MG tablet Take 5 mg by mouth 2 (two) times daily. 02/12/23   [provider]  Multiple Vitamins-Minerals (MULTIVITAMIN WITH MINERALS) tablet Take 1 tablet by mouth daily.    [provider]  naproxen (NAPROSYN) 375 MG tablet Take 375 mg by mouth 2 (two) times daily with a meal. 11/03/14   [provider]  potassium chloride SA (KLOR-CON M) 20 MEQ tablet Take 1 tablet (20 mEq total) by mouth 2 (two) times daily. 05/02/23   Renne Crigler, PA-C  rosuvastatin (CRESTOR) 40 MG tablet Take 40 mg by mouth daily.    [provider]                                                                                                                                    Allergies Atorvastatin, Penicillin g, and Penicillins  Review of Systems Review of Systems As noted in HPI  Physical Exam Vital Signs  I have reviewed the triage vital signs BP (!) 165/90 (BP Location: Right Arm)   Pulse (!) 106   Temp 98.4 F (36.9 C) (Oral)   Resp 16   SpO2 100%   Physical Exam Vitals reviewed.  Constitutional:      General: She is not in acute distress.    Appearance: She is well-developed. She is not diaphoretic.  HENT:     Head: Normocephalic and atraumatic.     Nose: Nose normal.  Eyes:     General: No scleral icterus.       Right eye: No discharge.        Left eye: No discharge.     Conjunctiva/sclera: Conjunctivae normal.     Pupils: Pupils are equal, round, and reactive to light.  Cardiovascular:     Rate and Rhythm: Normal rate and regular rhythm.     Heart sounds: No murmur heard.    No friction rub. No gallop.  Pulmonary:     Effort: Pulmonary effort is normal. No respiratory distress.     Breath sounds: Normal breath sounds. No stridor. No rales.  Abdominal:     General: There is no distension.     Palpations: Abdomen is soft.     Tenderness:  There is no abdominal tenderness.  Musculoskeletal:        General: No tenderness.     Cervical back: Normal range of motion and neck supple.  Skin:    General: Skin is warm and dry.     Findings: No erythema or rash.  Neurological:     Mental Status: She is alert and oriented to person, place, and time.     ED Results and Treatments Labs (all labs ordered are listed, but only abnormal results are displayed) Labs Reviewed  RESP PANEL BY RT-PCR (RSV, FLU A&B, COVID)  [id number] - Abnormal; Notable for the following components:      Result Value   SARS Coronavirus 2 by RT PCR POSITIVE (*)    All other components within normal limits  COMPREHENSIVE METABOLIC PANEL - Abnormal; Notable for the following components:   Potassium 3.3 (*)    Glucose, Bld 106 (*)    AST 45 (*)    All other components within normal limits  CBC                                                                                                                         EKG  EKG Interpretation Date/Time:    Ventricular Rate:    PR Interval:    QRS Duration:    QT Interval:    QTC Calculation:   R Axis:      Text Interpretation:         Radiology DG Chest 2 View Result Date: 10/15/2023 CLINICAL DATA:  Short of breath, flu like symptoms for 1 week EXAM: CHEST - 2 VIEW COMPARISON:  05/02/2023 FINDINGS: The heart size and mediastinal contours are within normal limits. Both lungs are clear. The visualized skeletal structures are unremarkable. IMPRESSION: No active cardiopulmonary disease. Electronically Signed   By: Sharlet Salina M.D.   On: 10/15/2023 23:33    Medications Ordered in ED Medications  acetaminophen (TYLENOL) tablet 650 mg (650 mg Oral Given 10/16/23 0008)   Procedures Procedures  (including critical care time) Medical Decision Making / ED Course   Medical Decision Making Amount and/or Complexity of Data Reviewed Labs: ordered. Decision-making details documented in ED Course. Radiology:  ordered and independent interpretation performed. Decision-making details documented in ED Course.  Risk OTC drugs.    Patient presents with elevated blood pressure in the setting of COVID infection.  Relatively asymptomatic other than her symptoms related to her COVID. Labs are reassuring without significant electrolyte derangements or renal sufficiency.  CBC without leukocytosis or anemia.  Patient still testing positive for COVID.  No influenza/RSV noted.  Chest x-ray obtained and negative for pneumonia.  No intervention for blood pressure at this time.  Recommended monitoring her blood pressure readings and documenting them over the next 1 to 2 weeks and then follow-up with PCP if still elevated.    Final Clinical Impression(s) / ED Diagnoses Final diagnoses:  COVID-19 virus infection  Elevated blood pressure reading   The patient appears reasonably screened and/or stabilized for discharge and I doubt any other medical condition or other [facility] requiring further screening, evaluation, or treatment in the ED at this time. I  have discussed the findings, Dx and Tx plan with the patient/family who expressed understanding and agree(s) with the plan. Discharge instructions discussed at length. The patient/family was given strict return precautions who verbalized understanding of the instructions. No further questions at time of discharge.  Disposition: Discharge  Condition: Good  ED Discharge Orders     None        Follow Up: [place], MD [address] Raeanne Gathers [place] [address] [phone]  Call  to schedule an appointment for close follow up    This chart was dictated using voice recognition software.  Despite best efforts to proofread,  errors can occur which can change the documentation meaning.    Nira Conn, MD 10/16/23 [phone]

## 2023-10-30 DIAGNOSIS — M5416 Radiculopathy, lumbar region: Secondary | ICD-10-CM | POA: Diagnosis not present

## 2023-10-30 DIAGNOSIS — M5442 Lumbago with sciatica, left side: Secondary | ICD-10-CM | POA: Diagnosis not present

## 2023-10-30 DIAGNOSIS — R350 Frequency of micturition: Secondary | ICD-10-CM | POA: Diagnosis not present

## 2023-11-15 DIAGNOSIS — E785 Hyperlipidemia, unspecified: Secondary | ICD-10-CM | POA: Diagnosis not present

## 2023-11-15 DIAGNOSIS — E559 Vitamin D deficiency, unspecified: Secondary | ICD-10-CM | POA: Diagnosis not present

## 2023-11-15 DIAGNOSIS — I10 Essential (primary) hypertension: Secondary | ICD-10-CM | POA: Diagnosis not present

## 2023-12-04 DIAGNOSIS — M5442 Lumbago with sciatica, left side: Secondary | ICD-10-CM | POA: Diagnosis not present

## 2023-12-04 DIAGNOSIS — I83892 Varicose veins of left lower extremities with other complications: Secondary | ICD-10-CM | POA: Diagnosis not present

## 2023-12-04 DIAGNOSIS — N182 Chronic kidney disease, stage 2 (mild): Secondary | ICD-10-CM | POA: Diagnosis not present

## 2023-12-04 DIAGNOSIS — E785 Hyperlipidemia, unspecified: Secondary | ICD-10-CM | POA: Diagnosis not present

## 2023-12-04 DIAGNOSIS — I10 Essential (primary) hypertension: Secondary | ICD-10-CM | POA: Diagnosis not present

## 2023-12-04 DIAGNOSIS — M5416 Radiculopathy, lumbar region: Secondary | ICD-10-CM | POA: Diagnosis not present

## 2023-12-04 DIAGNOSIS — R490 Dysphonia: Secondary | ICD-10-CM | POA: Diagnosis not present

## 2023-12-04 DIAGNOSIS — M7918 Myalgia, other site: Secondary | ICD-10-CM | POA: Diagnosis not present

## 2023-12-04 DIAGNOSIS — E559 Vitamin D deficiency, unspecified: Secondary | ICD-10-CM | POA: Diagnosis not present

## 2023-12-04 DIAGNOSIS — I83891 Varicose veins of right lower extremities with other complications: Secondary | ICD-10-CM | POA: Diagnosis not present

## 2023-12-17 DIAGNOSIS — H903 Sensorineural hearing loss, bilateral: Secondary | ICD-10-CM | POA: Diagnosis not present

## 2023-12-17 DIAGNOSIS — H9313 Tinnitus, bilateral: Secondary | ICD-10-CM | POA: Diagnosis not present

## 2024-01-22 DIAGNOSIS — I83891 Varicose veins of right lower extremities with other complications: Secondary | ICD-10-CM | POA: Diagnosis not present

## 2024-01-22 DIAGNOSIS — I10 Essential (primary) hypertension: Secondary | ICD-10-CM | POA: Diagnosis not present

## 2024-01-22 DIAGNOSIS — I83892 Varicose veins of left lower extremities with other complications: Secondary | ICD-10-CM | POA: Diagnosis not present

## 2024-01-22 DIAGNOSIS — R7303 Prediabetes: Secondary | ICD-10-CM | POA: Diagnosis not present

## 2024-01-22 DIAGNOSIS — M7918 Myalgia, other site: Secondary | ICD-10-CM | POA: Diagnosis not present

## 2024-01-22 DIAGNOSIS — I739 Peripheral vascular disease, unspecified: Secondary | ICD-10-CM | POA: Diagnosis not present

## 2024-01-22 DIAGNOSIS — E785 Hyperlipidemia, unspecified: Secondary | ICD-10-CM | POA: Diagnosis not present

## 2024-01-22 DIAGNOSIS — Z79899 Other long term (current) drug therapy: Secondary | ICD-10-CM | POA: Diagnosis not present

## 2024-03-04 DIAGNOSIS — M79605 Pain in left leg: Secondary | ICD-10-CM | POA: Diagnosis not present

## 2024-03-04 DIAGNOSIS — I83892 Varicose veins of left lower extremities with other complications: Secondary | ICD-10-CM | POA: Diagnosis not present

## 2024-03-04 DIAGNOSIS — L299 Pruritus, unspecified: Secondary | ICD-10-CM | POA: Diagnosis not present

## 2024-03-04 DIAGNOSIS — I10 Essential (primary) hypertension: Secondary | ICD-10-CM | POA: Diagnosis not present

## 2024-03-04 DIAGNOSIS — I83891 Varicose veins of right lower extremities with other complications: Secondary | ICD-10-CM | POA: Diagnosis not present

## 2024-03-04 DIAGNOSIS — I739 Peripheral vascular disease, unspecified: Secondary | ICD-10-CM | POA: Diagnosis not present

## 2024-03-04 DIAGNOSIS — M79604 Pain in right leg: Secondary | ICD-10-CM | POA: Diagnosis not present

## 2024-04-20 ENCOUNTER — Other Ambulatory Visit: Payer: Self-pay | Admitting: Internal Medicine

## 2024-04-20 DIAGNOSIS — M79605 Pain in left leg: Secondary | ICD-10-CM | POA: Diagnosis not present

## 2024-04-20 DIAGNOSIS — K069 Disorder of gingiva and edentulous alveolar ridge, unspecified: Secondary | ICD-10-CM | POA: Diagnosis not present

## 2024-04-20 DIAGNOSIS — M270 Developmental disorders of jaws: Secondary | ICD-10-CM

## 2024-04-20 DIAGNOSIS — I739 Peripheral vascular disease, unspecified: Secondary | ICD-10-CM | POA: Diagnosis not present

## 2024-04-20 DIAGNOSIS — E7849 Other hyperlipidemia: Secondary | ICD-10-CM | POA: Diagnosis not present

## 2024-04-20 DIAGNOSIS — Z23 Encounter for immunization: Secondary | ICD-10-CM | POA: Diagnosis not present

## 2024-04-20 DIAGNOSIS — L299 Pruritus, unspecified: Secondary | ICD-10-CM | POA: Diagnosis not present

## 2024-04-20 DIAGNOSIS — I83892 Varicose veins of left lower extremities with other complications: Secondary | ICD-10-CM | POA: Diagnosis not present

## 2024-04-20 DIAGNOSIS — I83891 Varicose veins of right lower extremities with other complications: Secondary | ICD-10-CM | POA: Diagnosis not present

## 2024-04-20 DIAGNOSIS — M79604 Pain in right leg: Secondary | ICD-10-CM | POA: Diagnosis not present

## 2024-04-29 ENCOUNTER — Ambulatory Visit
Admission: RE | Admit: 2024-04-29 | Discharge: 2024-04-29 | Disposition: A | Discharge status: Home / Self Care | Source: Ambulatory Visit | Attending: Internal Medicine | Admitting: Internal Medicine

## 2024-04-29 DIAGNOSIS — I6523 Occlusion and stenosis of bilateral carotid arteries: Secondary | ICD-10-CM | POA: Diagnosis not present

## 2024-04-29 DIAGNOSIS — M270 Developmental disorders of jaws: Secondary | ICD-10-CM
